# Patient Record
Sex: Male | Born: 1950 | Race: White | Hispanic: No | Marital: Married | Smoking: Former smoker
Health system: Southern US, Community
[De-identification: ages and names within clinical notes are randomized; demographics above are authoritative.]

## PROBLEM LIST (undated history)

## (undated) DIAGNOSIS — I2510 Atherosclerotic heart disease of native coronary artery without angina pectoris: Secondary | ICD-10-CM

## (undated) DIAGNOSIS — I700 Atherosclerosis of aorta: Secondary | ICD-10-CM

## (undated) DIAGNOSIS — Z9289 Personal history of other medical treatment: Secondary | ICD-10-CM

## (undated) DIAGNOSIS — E785 Hyperlipidemia, unspecified: Secondary | ICD-10-CM

## (undated) DIAGNOSIS — K760 Fatty (change of) liver, not elsewhere classified: Secondary | ICD-10-CM

## (undated) DIAGNOSIS — E119 Type 2 diabetes mellitus without complications: Secondary | ICD-10-CM

## (undated) DIAGNOSIS — J438 Other emphysema: Secondary | ICD-10-CM

## (undated) DIAGNOSIS — Z720 Tobacco use: Secondary | ICD-10-CM

## (undated) DIAGNOSIS — I214 Non-ST elevation (NSTEMI) myocardial infarction: Secondary | ICD-10-CM

## (undated) DIAGNOSIS — I10 Essential (primary) hypertension: Secondary | ICD-10-CM

## (undated) HISTORY — DX: Personal history of other medical treatment: Z92.89

## (undated) HISTORY — DX: Non-ST elevation (NSTEMI) myocardial infarction: I21.4

## (undated) HISTORY — DX: Hyperlipidemia, unspecified: E78.5

## (undated) HISTORY — PX: BRAIN SURGERY: SHX531

## (undated) HISTORY — DX: Tobacco use: Z72.0

## (undated) HISTORY — DX: Atherosclerotic heart disease of native coronary artery without angina pectoris: I25.10

## (undated) HISTORY — PX: CARDIAC CATHETERIZATION: SHX172

---

## 2005-09-23 ENCOUNTER — Emergency Department: Payer: Self-pay | Admitting: Emergency Medicine

## 2014-04-27 ENCOUNTER — Emergency Department: Payer: Self-pay | Admitting: Internal Medicine

## 2018-05-15 ENCOUNTER — Emergency Department: Payer: PPO

## 2018-05-15 ENCOUNTER — Encounter: Payer: Self-pay | Admitting: Emergency Medicine

## 2018-05-15 ENCOUNTER — Inpatient Hospital Stay
Admission: EM | Admit: 2018-05-15 | Discharge: 2018-05-17 | DRG: 280 | Disposition: A | Discharge status: Home / Self Care | Payer: PPO | Attending: Internal Medicine | Admitting: Internal Medicine

## 2018-05-15 DIAGNOSIS — I214 Non-ST elevation (NSTEMI) myocardial infarction: Secondary | ICD-10-CM

## 2018-05-15 DIAGNOSIS — Z833 Family history of diabetes mellitus: Secondary | ICD-10-CM | POA: Diagnosis not present

## 2018-05-15 DIAGNOSIS — F17210 Nicotine dependence, cigarettes, uncomplicated: Secondary | ICD-10-CM | POA: Diagnosis not present

## 2018-05-15 DIAGNOSIS — M542 Cervicalgia: Secondary | ICD-10-CM | POA: Diagnosis not present

## 2018-05-15 DIAGNOSIS — Z8249 Family history of ischemic heart disease and other diseases of the circulatory system: Secondary | ICD-10-CM

## 2018-05-15 DIAGNOSIS — I10 Essential (primary) hypertension: Secondary | ICD-10-CM | POA: Diagnosis present

## 2018-05-15 DIAGNOSIS — F17200 Nicotine dependence, unspecified, uncomplicated: Secondary | ICD-10-CM | POA: Diagnosis not present

## 2018-05-15 DIAGNOSIS — J189 Pneumonia, unspecified organism: Secondary | ICD-10-CM | POA: Diagnosis not present

## 2018-05-15 DIAGNOSIS — J181 Lobar pneumonia, unspecified organism: Secondary | ICD-10-CM | POA: Diagnosis not present

## 2018-05-15 DIAGNOSIS — J439 Emphysema, unspecified: Secondary | ICD-10-CM | POA: Insufficient documentation

## 2018-05-15 DIAGNOSIS — I25110 Atherosclerotic heart disease of native coronary artery with unstable angina pectoris: Secondary | ICD-10-CM | POA: Diagnosis present

## 2018-05-15 DIAGNOSIS — M79601 Pain in right arm: Secondary | ICD-10-CM | POA: Diagnosis not present

## 2018-05-15 DIAGNOSIS — Z720 Tobacco use: Secondary | ICD-10-CM | POA: Diagnosis not present

## 2018-05-15 DIAGNOSIS — I200 Unstable angina: Secondary | ICD-10-CM | POA: Diagnosis not present

## 2018-05-15 DIAGNOSIS — E871 Hypo-osmolality and hyponatremia: Secondary | ICD-10-CM | POA: Diagnosis not present

## 2018-05-15 DIAGNOSIS — E785 Hyperlipidemia, unspecified: Secondary | ICD-10-CM | POA: Diagnosis present

## 2018-05-15 DIAGNOSIS — R911 Solitary pulmonary nodule: Secondary | ICD-10-CM | POA: Diagnosis not present

## 2018-05-15 DIAGNOSIS — I700 Atherosclerosis of aorta: Secondary | ICD-10-CM | POA: Diagnosis not present

## 2018-05-15 DIAGNOSIS — I340 Nonrheumatic mitral (valve) insufficiency: Secondary | ICD-10-CM | POA: Diagnosis not present

## 2018-05-15 DIAGNOSIS — E782 Mixed hyperlipidemia: Secondary | ICD-10-CM | POA: Diagnosis not present

## 2018-05-15 DIAGNOSIS — R0789 Other chest pain: Secondary | ICD-10-CM | POA: Diagnosis not present

## 2018-05-15 HISTORY — DX: Non-ST elevation (NSTEMI) myocardial infarction: I21.4

## 2018-05-15 LAB — CBC
HCT: 41.5 % (ref 39.0–52.0)
Hemoglobin: 14.2 g/dL (ref 13.0–17.0)
MCH: 31.4 pg (ref 26.0–34.0)
MCHC: 34.2 g/dL (ref 30.0–36.0)
MCV: 91.8 fL (ref 80.0–100.0)
Platelets: 201 10*3/uL (ref 150–400)
RBC: 4.52 MIL/uL (ref 4.22–5.81)
RDW: 12.3 % (ref 11.5–15.5)
WBC: 13.2 10*3/uL — ABNORMAL HIGH (ref 4.0–10.5)
nRBC: 0 % (ref 0.0–0.2)

## 2018-05-15 LAB — BASIC METABOLIC PANEL
Anion gap: 7 (ref 5–15)
BUN: 12 mg/dL (ref 8–23)
CO2: 25 mmol/L (ref 22–32)
Calcium: 9 mg/dL (ref 8.9–10.3)
Chloride: 101 mmol/L (ref 98–111)
Creatinine, Ser: 1.09 mg/dL (ref 0.61–1.24)
GFR calc Af Amer: >60 mL/min (ref >60)
GFR calc non Af Amer: >60 mL/min (ref >60)
Glucose, Bld: 109 mg/dL — ABNORMAL HIGH (ref 70–99)
POTASSIUM: 4 mmol/L (ref 3.5–5.1)
Sodium: 133 mmol/L — ABNORMAL LOW (ref 135–145)

## 2018-05-15 LAB — TROPONIN I
Troponin I: 6.99 ng/mL (ref <0.03)
Troponin I: 6.92 ng/mL (ref <0.03)

## 2018-05-15 LAB — INFLUENZA PANEL BY PCR (TYPE A & B)
Influenza A By PCR: NEGATIVE
Influenza B By PCR: NEGATIVE

## 2018-05-15 LAB — PROTIME-INR
INR: 0.94
Prothrombin Time: 12.5 seconds (ref 11.4–15.2)

## 2018-05-15 LAB — HEPARIN LEVEL (UNFRACTIONATED): Heparin Unfractionated: <0.1 IU/mL — ABNORMAL LOW (ref 0.30–0.70)

## 2018-05-15 LAB — APTT: aPTT: 31 seconds (ref 24–36)

## 2018-05-15 LAB — HEMOGLOBIN A1C
Hgb A1c MFr Bld: 5.8 % — ABNORMAL HIGH (ref 4.8–5.6)
Mean Plasma Glucose: 119.76 mg/dL

## 2018-05-15 MED ORDER — ASPIRIN 81 MG PO CHEW
324.0000 mg | CHEWABLE_TABLET | Freq: Once | ORAL | Status: AC
  Administered 2018-05-15: 324 mg via ORAL
  Filled 2018-05-15: qty 4

## 2018-05-15 MED ORDER — SODIUM CHLORIDE 0.9 % IV SOLN
500.0000 mg | INTRAVENOUS | Status: DC
  Filled 2018-05-15: qty 500

## 2018-05-15 MED ORDER — SODIUM CHLORIDE 0.9 % IV SOLN: 250.0000 mL | INTRAVENOUS | Status: DC | PRN

## 2018-05-15 MED ORDER — SODIUM CHLORIDE 0.9% FLUSH: 3.0000 mL | INTRAVENOUS | Status: DC | PRN

## 2018-05-15 MED ORDER — METOPROLOL TARTRATE 25 MG PO TABS
25.0000 mg | ORAL_TABLET | Freq: Two times a day (BID) | ORAL | Status: DC
  Administered 2018-05-15 – 2018-05-17 (×4): 25 mg via ORAL
  Filled 2018-05-15 (×4): qty 1

## 2018-05-15 MED ORDER — ZOLPIDEM TARTRATE 5 MG PO TABS: 5.0000 mg | ORAL_TABLET | Freq: Every evening | ORAL | Status: DC | PRN

## 2018-05-15 MED ORDER — SODIUM CHLORIDE 0.9 % IV SOLN
INTRAVENOUS | Status: AC
  Administered 2018-05-15: 21:00:00 via INTRAVENOUS

## 2018-05-15 MED ORDER — ATORVASTATIN CALCIUM 20 MG PO TABS
40.0000 mg | ORAL_TABLET | Freq: Every day | ORAL | Status: DC
  Administered 2018-05-15 – 2018-05-16 (×2): 40 mg via ORAL
  Filled 2018-05-15 (×2): qty 2

## 2018-05-15 MED ORDER — NICOTINE 14 MG/24HR TD PT24
14.0000 mg | MEDICATED_PATCH | Freq: Every day | TRANSDERMAL | Status: DC
  Administered 2018-05-15 – 2018-05-17 (×3): 14 mg via TRANSDERMAL
  Filled 2018-05-15 (×3): qty 1

## 2018-05-15 MED ORDER — HYDRALAZINE HCL 20 MG/ML IJ SOLN: 10.0000 mg | Freq: Four times a day (QID) | INTRAMUSCULAR | Status: DC | PRN

## 2018-05-15 MED ORDER — GUAIFENESIN-DM 100-10 MG/5ML PO SYRP: 5.0000 mL | ORAL_SOLUTION | ORAL | Status: DC | PRN

## 2018-05-15 MED ORDER — SODIUM CHLORIDE 0.9 % IV SOLN
1.0000 g | Freq: Once | INTRAVENOUS | Status: AC
  Administered 2018-05-15: 1 g via INTRAVENOUS
  Filled 2018-05-15: qty 10

## 2018-05-15 MED ORDER — HEPARIN (PORCINE) 25000 UT/250ML-% IV SOLN
1050.0000 [IU]/h | INTRAVENOUS | Status: DC
  Administered 2018-05-15: 750 [IU]/h via INTRAVENOUS
  Filled 2018-05-15: qty 250

## 2018-05-15 MED ORDER — ACETAMINOPHEN 325 MG PO TABS: 650.0000 mg | ORAL_TABLET | ORAL | Status: DC | PRN

## 2018-05-15 MED ORDER — ASPIRIN EC 81 MG PO TBEC
81.0000 mg | DELAYED_RELEASE_TABLET | Freq: Every day | ORAL | Status: DC
  Administered 2018-05-16 – 2018-05-17 (×2): 81 mg via ORAL
  Filled 2018-05-15 (×2): qty 1

## 2018-05-15 MED ORDER — SODIUM CHLORIDE 0.9 % IV SOLN
500.0000 mg | Freq: Once | INTRAVENOUS | Status: AC
  Administered 2018-05-15: 500 mg via INTRAVENOUS
  Filled 2018-05-15: qty 500

## 2018-05-15 MED ORDER — IOHEXOL 350 MG/ML SOLN
75.0000 mL | Freq: Once | INTRAVENOUS | Status: AC | PRN
  Administered 2018-05-15: 75 mL via INTRAVENOUS

## 2018-05-15 MED ORDER — SODIUM CHLORIDE 0.9% FLUSH
3.0000 mL | Freq: Two times a day (BID) | INTRAVENOUS | Status: DC
  Administered 2018-05-15 – 2018-05-17 (×3): 3 mL via INTRAVENOUS

## 2018-05-15 MED ORDER — SODIUM CHLORIDE 0.9 % IV SOLN
1.0000 g | INTRAVENOUS | Status: DC
  Filled 2018-05-15: qty 10

## 2018-05-15 MED ORDER — ALPRAZOLAM 0.25 MG PO TABS: 0.2500 mg | ORAL_TABLET | Freq: Two times a day (BID) | ORAL | Status: DC | PRN

## 2018-05-15 MED ORDER — NITROGLYCERIN 0.4 MG SL SUBL: 0.4000 mg | SUBLINGUAL_TABLET | SUBLINGUAL | Status: DC | PRN

## 2018-05-15 MED ORDER — HEPARIN BOLUS VIA INFUSION
3900.0000 [IU] | Freq: Once | INTRAVENOUS | Status: AC
  Administered 2018-05-15: 3900 [IU] via INTRAVENOUS
  Filled 2018-05-15: qty 3900

## 2018-05-15 MED ORDER — HEPARIN BOLUS VIA INFUSION
2000.0000 [IU] | Freq: Once | INTRAVENOUS | Status: AC
  Administered 2018-05-15: 2000 [IU] via INTRAVENOUS
  Filled 2018-05-15: qty 2000

## 2018-05-15 MED ORDER — ONDANSETRON HCL 4 MG/2ML IJ SOLN: 4.0000 mg | Freq: Four times a day (QID) | INTRAMUSCULAR | Status: DC | PRN

## 2018-05-15 NOTE — H&P (Addendum)
[facility] [place] NAME: Donald Lowery    MR#:  [id number]  DATE OF BIRTH:  1950/07/08  DATE OF ADMISSION:  05/15/2018  PRIMARY CARE PHYSICIAN: Patient, No Pcp Per   REQUESTING/REFERRING PHYSICIAN: Dr. Kerman Passey  CHIEF COMPLAINT:   Chief Complaint  Patient presents with  . Chest Pain  . Nausea   Chest pain on and off for several days. HISTORY OF PRESENT ILLNESS:  Donald Lowery  is a 68 y.o. male with no past medical history.  The patient complains of chest pain on and off for the past several days.  The chest pain is friend across the chest with radiation to the neck, back and bilateral arms.  He also complains of nausea but no diaphoresis or shortness of breath.  He denies any medical history and has no PCP.  He is found elevated troponin level at 6.99.  CT angios did not show any PE but pneumonia.  He is treated with aspirin, heparin drip and antibiotics in the ED.  PAST MEDICAL HISTORY:  History reviewed. No pertinent past medical history.  PAST SURGICAL HISTORY:   Past Surgical History:  Procedure Laterality Date  . BRAIN SURGERY      Brain surgery.  SOCIAL HISTORY:   Social History   Tobacco Use  . Smoking status: Not on file  Substance Use Topics  . Alcohol use: Not on file    FAMILY HISTORY:   Family History  Problem Relation Age of Onset  . Heart Problems Mother   . Heart attack Father   . Diabetes Brother   . Heart Problems Brother     Father has heart attack in 70s, mother and brother had heart problems.  Brother had diabetes.  DRUG ALLERGIES:  No Known Allergies  REVIEW OF SYSTEMS:   Review of Systems  Constitutional: Negative for chills, fever and malaise/fatigue.  HENT: Positive for hearing loss. Negative for sore throat.   Eyes: Negative for blurred vision and double vision.  Respiratory: Negative for cough, hemoptysis, shortness of breath, wheezing and stridor.   Cardiovascular: Positive for  chest pain. Negative for palpitations, orthopnea and leg swelling.  Gastrointestinal: Negative for abdominal pain, blood in stool, diarrhea, melena, nausea and vomiting.  Genitourinary: Negative for dysuria, flank pain and hematuria.  Musculoskeletal: Positive for back pain. Negative for joint pain.  Skin: Negative for rash.  Neurological: Negative for dizziness, sensory change, focal weakness, seizures, loss of consciousness, weakness and headaches.  Endo/Heme/Allergies: Negative for polydipsia.  Psychiatric/Behavioral: Negative for depression. The patient is not nervous/anxious.     MEDICATIONS AT HOME:   Prior to Admission medications   Medication Sig Start Date End Date Taking? Authorizing Provider  ibuprofen (ADVIL,MOTRIN) 200 MG tablet Take 200-400 mg by mouth every 6 (six) hours as needed for mild pain or moderate pain.   Yes [provider]      VITAL SIGNS:  Blood pressure (!) 165/83, pulse 68, temperature 98.1 F (36.7 C), temperature source Oral, resp. rate (!) 22, height 5\' 6"  (1.676 m), weight 65.8 kg, SpO2 99 %.  PHYSICAL EXAMINATION:  Physical Exam  GENERAL:  68 y.o.-year-old patient lying in the bed with no acute distress.  EYES: Pupils equal, round, reactive to light and accommodation. No scleral icterus. Extraocular muscles intact.  HEENT: Head atraumatic, normocephalic. Oropharynx and nasopharynx clear.  NECK:  Supple, no jugular venous distention. No thyroid enlargement, no tenderness.  LUNGS: Normal breath sounds bilaterally, no wheezing, bilateral  crackles but no rhonchi or crepitation. No use of accessory muscles of respiration.  CARDIOVASCULAR: S1, S2 normal. No murmurs, rubs, or gallops.  ABDOMEN: Soft, nontender, nondistended. Bowel sounds present. No organomegaly or mass.  EXTREMITIES: No pedal edema, cyanosis, or clubbing.  NEUROLOGIC: Cranial nerves II through XII are intact. Muscle strength 5/5 in all extremities. Sensation intact. Gait not  checked.  PSYCHIATRIC: The patient is alert and oriented x 3.  SKIN: No obvious rash, lesion, or ulcer.   LABORATORY PANEL:   CBC Recent Labs  Lab 05/15/18 1450  WBC 13.2*  HGB 14.2  HCT 41.5  PLT 201   ------------------------------------------------------------------------------------------------------------------  Chemistries  Recent Labs  Lab 05/15/18 1450  NA 133*  K 4.0  CL 101  CO2 25  GLUCOSE 109*  BUN 12  CREATININE 1.09  CALCIUM 9.0   ------------------------------------------------------------------------------------------------------------------  Cardiac Enzymes Recent Labs  Lab 05/15/18 1450  TROPONINI 6.99*   ------------------------------------------------------------------------------------------------------------------  RADIOLOGY:  Dg Chest 2 View  Result Date: 05/15/2018 CLINICAL DATA:  Chest pain for 5 days EXAM: CHEST - 2 VIEW COMPARISON:  None. FINDINGS: The lungs are hyperinflated likely secondary to COPD. There is spiculated left apical airspace disease. There is no pleural effusion or pneumothorax. The heart size is normal. IMPRESSION: Spiculated left apical airspace disease. Differential considerations include an infectious etiology including atypical infection such as tuberculosis versus malignancy. Followup PA and lateral chest X-ray is recommended in 3-4 weeks following trial of antibiotic therapy to ensure resolution and exclude underlying malignancy. Electronically Signed   By: Kathreen Devoid   On: 05/15/2018 15:09   Ct Angio Chest Pe W And/or Wo Contrast  Result Date: 05/15/2018 CLINICAL DATA:  Three episodes of throbbing left neck pain with associated pain in both arms and in the chest over the past 4 days. EXAM: CT ANGIOGRAPHY CHEST WITH CONTRAST TECHNIQUE: Multidetector CT imaging of the chest was performed using the standard protocol during bolus administration of intravenous contrast. Multiplanar CT image reconstructions and MIPs were  obtained to evaluate the vascular anatomy. CONTRAST:  61mL OMNIPAQUE IOHEXOL 350 MG/ML SOLN COMPARISON:  Chest radiographs dated 05/15/2018. FINDINGS: Cardiovascular: Atheromatous calcifications, including the coronary arteries and aorta. Normally opacified pulmonary arteries with no pulmonary arterial filling defects seen. Mediastinum/Nodes: Single mildly enlarged left hilar node with a short axis diameter 10 mm on image number 40 series 4. No enlarged mediastinal or axillary nodes. Unremarkable esophagus and thyroid gland. Lungs/Pleura: Patchy airspace opacity in the left upper lobe with air bronchograms. Bilateral bullous changes, most pronounced in the upper lobes. Minimal bilateral dependent atelectasis. Minimal left pleural fluid. Upper Abdomen: Unremarkable. Musculoskeletal: Thoracic and lower cervical spine degenerative changes. Review of the MIP images confirms the above findings. IMPRESSION: 1. Left upper lobe pneumonia. This has a less atypical appearance on the CT compared to the radiographs. Some of the appearance of the radiographs was due to bullous changes in that area. Followup PA and lateral chest X-ray is recommended in 3-4 weeks following trial of antibiotic therapy to ensure resolution and exclude underlying malignancy. 2. No pulmonary emboli. 3. Minimal probable reactive left hilar adenopathy 4. Minimal left pleural fluid. 5. Changes of COPD with paraseptal emphysema. 6. Minimal calcific coronary artery and aortic atherosclerosis. Aortic Atherosclerosis (ICD10-I70.0) and Emphysema (ICD10-J43.9). Electronically Signed   By: Claudie Revering M.D.   On: 05/15/2018 17:00      IMPRESSION AND PLAN:   Non-STEMI. The patient will be admitted to telemetry floor. Continue heparin drip, aspirin, start Lopressor and Lipitor,  follow-up with troponin level, lipid panel and cardiology consult.  Pneumonia with leukocytosis.  Continue Zithromax and Rocephin, follow-up CBC and cultures.  Hyponatremia.   Normal saline IV and follow-up BMP.  Hypertension.  Start Lopressor and IV hydralazine PRN. Tobacco abuse.  Smoking cessation was counseled for 3-4 minutes.  All the records are reviewed and case discussed with ED provider. Management plans discussed with the patient, his wife and they are in agreement.  CODE STATUS: Full code.  TOTAL TIME TAKING CARE OF THIS PATIENT: 32 minutes.    Demetrios Loll M.D on 05/15/2018 at 5:43 PM  Between 7am to 6pm - Pager - [phone]  After 6pm go to [email or web address] - Proofreader  [facility]  Office  [phone]  CC: Primary care physician; Patient, No Pcp Per   Note: This dictation was prepared with Dragon dictation along with smaller phrase technology. Any transcriptional errors that result from this process are unin

## 2018-05-15 NOTE — Consult Note (Signed)
ANTICOAGULATION CONSULT NOTE - Initial Consult  Pharmacy Consult for Heparin infusion Indication: chest pain/ACS  No Known Allergies  Patient Measurements: Height: 5\' 6"  (167.6 cm) Weight: 147 lb 8 oz (66.9 kg) IBW/kg (Calculated) : 63.8 Heparin Dosing Weight: 65.8  Vital Signs: Temp: 98.1 F (36.7 C) (01/16 1446) Temp Source: Oral (01/16 1446) BP: 165/79 (01/16 1910) Pulse Rate: 65 (01/16 1910)  Labs: Recent Labs    05/15/18 1450 05/15/18 1557 05/15/18 1950 05/15/18 2221  HGB 14.2  --   --   --   HCT 41.5  --   --   --   PLT 201  --   --   --   APTT  --  31  --   --   LABPROT  --  12.5  --   --   INR  --  0.94  --   --   HEPARINUNFRC  --   --   --  <0.10*  CREATININE 1.09  --   --   --   TROPONINI 6.99*  --  6.92*  --     Estimated Creatinine Clearance: 58.5 mL/min (by C-G formula based on SCr of 1.09 mg/dL).   Medical History: History reviewed. No pertinent past medical history.  Medications:  Per med rec patient only takes ibuprofen on occasion.  Assessment: 68 y.o. male with no past medical history, takes no medications, presents to the emergency department for 2 days of intermittent chest pain.  Goal of Therapy:  Heparin level 0.3-0.7 units/ml Monitor platelets by anticoagulation protocol: Yes   Plan:  01/16 @ 2221 HL < 0.10 subtherapeutic. Per RN drip was not stopped. Will rebolus w/ heparin 2000 units IV x 1 and increase rate to 900 units/hr and will recheck HL w/ am labs. Will f/u w/ CBC w/ am labs.  Tobie Lords, PharmD Clinical Pharmacist 05/15/2018,11:26 PM

## 2018-05-15 NOTE — ED Provider Notes (Signed)
[facility] Emergency Department Provider Note  Time seen: 3:59 PM  I have reviewed the triage vital signs and the nursing notes.   HISTORY  Chief Complaint Chest Pain and Nausea    HPI Donald Lowery is a 68 y.o. male with no past medical history, takes no medications, presents to the emergency department for 2 days of intermittent chest pain.  According to the patient for the past 2 days he has had 3 episodes of pain which starts in his neck and then radiates down both of his arms and into the center of his chest and somewhat to his back.  States the pain will last anywhere between 30 minutes and 1 hour before dissipating on its own.  Last episode of pain was late last night.  Currently denies any symptoms at this time.  No chest pain.  States when the symptoms do occur he does not become diaphoretic or short of breath but does become nauseated next he had an episode of vomiting last night.  No leg pain or swelling.  No cardiac history per patient.  Patient is a daily smoker.   History reviewed. No pertinent past medical history.  There are no active problems to display for this patient.   History reviewed. No pertinent surgical history.  Prior to Admission medications   Not on File    Not on File  No family history on file.  Social History Social History   Tobacco Use  . Smoking status: Not on file  Substance Use Topics  . Alcohol use: Not on file  . Drug use: Not on file    Review of Systems Constitutional: Negative for fever. Cardiovascular: 3 episodes of chest pain over the past 2 days.  None currently. Respiratory: Negative for shortness of breath.  No recent cough or congestion. Gastrointestinal: Negative for abdominal pain.  One episode of nausea and vomiting last night with the chest pain. Genitourinary: Negative for urinary compaints Musculoskeletal: Negative for leg pain or swelling Skin: Negative for skin complaints  Neurological:  Negative for headache All other ROS negative  ____________________________________________   PHYSICAL EXAM:  VITAL SIGNS: ED Triage Vitals [05/15/18 1446]  Enc Vitals Group     BP (!) 145/74     Pulse Rate 79     Resp 20     Temp 98.1 F (36.7 C)     Temp Source Oral     SpO2 97 %     Weight 145 lb (65.8 kg)     Height 5\' 6"  (1.676 m)     Head Circumference      Peak Flow      Pain Score 0     Pain Loc      Pain Edu?      Excl. in [place]?    Constitutional: Alert and oriented. Well appearing and in no distress. Eyes: Normal exam ENT   Head: Normocephalic and atraumatic.   Mouth/Throat: Mucous membranes are moist. Cardiovascular: Normal rate, regular rhythm. No murmur Respiratory: Normal respiratory effort without tachypnea nor retractions. Breath sounds are clear  Gastrointestinal: Soft and nontender. No distention. Musculoskeletal: Nontender with normal range of motion in all extremities. No lower extremity tenderness or edema. Neurologic:  Normal speech and language. No gross focal neurologic deficits Skin:  Skin is warm, dry and intact.  Psychiatric: Mood and affect are normal.  ____________________________________________    EKG  EKG viewed and interpreted by myself shows a normal sinus rhythm 80 bpm with  a narrow QRS, normal axis, normal intervals, nonspecific ST changes but no ST elevation.  ____________________________________________    RADIOLOGY  IMPRESSION: Spiculated left apical airspace disease. Differential considerations include an infectious etiology including atypical infection such as tuberculosis versus malignancy. Followup PA and lateral chest X-ray is recommended in 3-4 weeks following trial of antibiotic therapy to ensure resolution and exclude underlying malignancy.  ____________________________________________   INITIAL IMPRESSION / ASSESSMENT AND PLAN / ED COURSE  Pertinent labs & imaging results that were available during my  care of the patient were reviewed by me and considered in my medical decision making (see chart for details).  Patient presents to the emergency department for 3 episodes of chest pain occurring over the past 2 days.  Differential would include ACS, PE, chest wall pain. Patient's labs have resulted showing significant troponin elevation 6.99.  Chest x-ray shows spiculated airspace disease, patient is a daily smoker.  Given elevated troponin we will obtain CT scan of the chest with contrast to help rule out PE but also to evaluate the lung mass.  Patient agreeable to plan of care.  I have ordered a heparin infusion, we will dose aspirin.  Regardless the patient will ultimately need to be admitted to the hospitalist service once his emergency department work-up is completed.  Reassuringly no ST elevation on EKG and no current chest symptoms.  CT angiography shows no PE, states the spiculated lesion seen on x-ray is most consistent with pneumonia on CT.  We will check blood cultures and start IV antibiotics to cover for pneumonia.  Patient receiving IV heparin, will be admitted to the hospitalist service.    CRITICAL CARE Performed by: Harvest Dark   Total critical care time: 30 minutes  Critical care time was exclusive of separately billable procedures and treating other patients.  Critical care was necessary to treat or prevent imminent or life-threatening deterioration.  Critical care was time spent personally by me on the following activities: development of treatment plan with patient and/or surrogate as well as nursing, discussions with consultants, evaluation of patient's response to treatment, examination of patient, obtaining history from patient or surrogate, ordering and performing treatments and interventions, ordering and review of laboratory studies, ordering and review of radiographic studies, pulse oximetry and re-evaluation of patient's  condition.  ____________________________________________   FINAL CLINICAL IMPRESSION(S) / ED DIAGNOSES  NSTEMI Pneumonia   Harvest Dark, MD 05/15/18 1709

## 2018-05-15 NOTE — ED Notes (Signed)
ED TO INPATIENT HANDOFF REPORT  Name/Age/Gender Donald Lowery 68 y.o. male  Code Status   Home/SNF/Other From Home   Chief Complaint neck and arma pin  Level of Care/Admitting Diagnosis ED Disposition    ED Disposition Condition [place]: [facility] [[id number]]  Level of Care: Telemetry [5]  Diagnosis: NSTEMI (non-ST elevated myocardial infarction) [facility]) [[id number]]  Admitting Physician: Demetrios Loll [[id number]]  Attending Physician: Demetrios Loll [[id number]]  Estimated length of stay: past midnight tomorrow  Certification:: I certify this patient will need inpatient services for at least 2 midnights  PT Class (Do Not Modify): Inpatient [101]  PT Acc Code (Do Not Modify): Private [1]       Medical History History reviewed. No pertinent past medical history.  Allergies No Known Allergies  IV Location/Drains/Wounds Patient Lines/Drains/Airways Status   Active Line/Drains/Airways    Name:   Placement date:   Placement time:   Site:   Days:   Peripheral IV 05/15/18 Right Antecubital   05/15/18    1605    Antecubital   less than 1   Peripheral IV 05/15/18 Left Antecubital   05/15/18    1635    Antecubital   less than 1          Labs/Imaging Results for orders placed or performed during the hospital encounter of 05/15/18 (from the past 48 hour(s))  Basic metabolic panel     Status: Abnormal   Collection Time: 05/15/18  2:50 PM  Result Value Ref Range   Sodium 133 (L) 135 - 145 mmol/L   Potassium 4.0 3.5 - 5.1 mmol/L   Chloride 101 98 - 111 mmol/L   CO2 25 22 - 32 mmol/L   Glucose, Bld 109 (H) 70 - 99 mg/dL   BUN 12 8 - 23 mg/dL   Creatinine, Ser 1.09 0.61 - 1.24 mg/dL   Calcium 9.0 8.9 - 10.3 mg/dL   GFR calc non Af Amer >60 >60 mL/min   GFR calc Af Amer >60 >60 mL/min   Anion gap 7 5 - 15    Comment: Performed at [facility], [address] [place], [place] [address]  CBC     Status: Abnormal   Collection Time:  05/15/18  2:50 PM  Result Value Ref Range   WBC 13.2 (H) 4.0 - 10.5 K/uL   RBC 4.52 4.22 - 5.81 MIL/uL   Hemoglobin 14.2 13.0 - 17.0 g/dL   HCT 41.5 39.0 - 52.0 %   MCV 91.8 80.0 - 100.0 fL   MCH 31.4 26.0 - 34.0 pg   MCHC 34.2 30.0 - 36.0 g/dL   RDW 12.3 11.5 - 15.5 %   Platelets 201 150 - 400 K/uL   nRBC 0.0 0.0 - 0.2 %    Comment: Performed at [facility], [address] [place], [place] [address]  Troponin I - ONCE - STAT     Status: Abnormal   Collection Time: 05/15/18  2:50 PM  Result Value Ref Range   Troponin I 6.99 (HH) <0.03 ng/mL    Comment: CRITICAL RESULT CALLED TO, READ BACK BY AND VERIFIED WITH Gwynn Burly 05/15/18 @ [place] Performed at [facility], [address] [place], [place] [address]   Protime-INR     Status: None   Collection Time: 05/15/18  3:57 PM  Result Value Ref Range   Prothrombin Time 12.5 11.4 - 15.2 seconds   INR 0.94  Comment: Performed at [facility], [address] [place], [place] [address]  APTT     Status: None   Collection Time: 05/15/18  3:57 PM  Result Value Ref Range   aPTT 31 24 - 36 seconds    Comment: Performed at [facility], [address] [place], [place] [address]   Dg Chest 2 View  Result Date: 05/15/2018 CLINICAL DATA:  Chest pain for 5 days EXAM: CHEST - 2 VIEW COMPARISON:  None. FINDINGS: The lungs are hyperinflated likely secondary to COPD. There is spiculated left apical airspace disease. There is no pleural effusion or pneumothorax. The heart size is normal. IMPRESSION: Spiculated left apical airspace disease. Differential considerations include an infectious etiology including atypical infection such as tuberculosis versus malignancy. Followup PA and lateral chest X-ray is recommended in 3-4 weeks following trial of antibiotic therapy to ensure resolution and exclude underlying malignancy. Electronically Signed   By: Kathreen Devoid   On: 05/15/2018 15:09   Ct Angio Chest Pe W  And/or Wo Contrast  Result Date: 05/15/2018 CLINICAL DATA:  Three episodes of throbbing left neck pain with associated pain in both arms and in the chest over the past 4 days. EXAM: CT ANGIOGRAPHY CHEST WITH CONTRAST TECHNIQUE: Multidetector CT imaging of the chest was performed using the standard protocol during bolus administration of intravenous contrast. Multiplanar CT image reconstructions and MIPs were obtained to evaluate the vascular anatomy. CONTRAST:  86mL OMNIPAQUE IOHEXOL 350 MG/ML SOLN COMPARISON:  Chest radiographs dated 05/15/2018. FINDINGS: Cardiovascular: Atheromatous calcifications, including the coronary arteries and aorta. Normally opacified pulmonary arteries with no pulmonary arterial filling defects seen. Mediastinum/Nodes: Single mildly enlarged left hilar node with a short axis diameter 10 mm on image number 40 series 4. No enlarged mediastinal or axillary nodes. Unremarkable esophagus and thyroid gland. Lungs/Pleura: Patchy airspace opacity in the left upper lobe with air bronchograms. Bilateral bullous changes, most pronounced in the upper lobes. Minimal bilateral dependent atelectasis. Minimal left pleural fluid. Upper Abdomen: Unremarkable. Musculoskeletal: Thoracic and lower cervical spine degenerative changes. Review of the MIP images confirms the above findings. IMPRESSION: 1. Left upper lobe pneumonia. This has a less atypical appearance on the CT compared to the radiographs. Some of the appearance of the radiographs was due to bullous changes in that area. Followup PA and lateral chest X-ray is recommended in 3-4 weeks following trial of antibiotic therapy to ensure resolution and exclude underlying malignancy. 2. No pulmonary emboli. 3. Minimal probable reactive left hilar adenopathy 4. Minimal left pleural fluid. 5. Changes of COPD with paraseptal emphysema. 6. Minimal calcific coronary artery and aortic atherosclerosis. Aortic Atherosclerosis (ICD10-I70.0) and Emphysema  (ICD10-J43.9). Electronically Signed   By: Claudie Revering M.D.   On: 05/15/2018 17:00    Pending Labs Unresulted Labs (From admission, onward)    Start     Ordered   05/15/18 2230  Heparin level (unfractionated)  Once-Timed,   STAT     05/15/18 1609   05/15/18 1709  Blood culture (routine x 2)  BLOOD CULTURE X 2,   STAT     05/15/18 1708   Signed and Held  HIV antibody (Routine Testing)  Once,   R     Signed and Held   Signed and Held  Troponin I - Now Then Q6H  Now then every 6 hours,   STAT     Signed and Held   Signed and Held  Hemoglobin A1c  Add-on,   R     Signed  and Held   Signed and Held  Culture, blood (routine x 2) Call MD if unable to obtain prior to antibiotics being given  BLOOD CULTURE X 2,   R    Comments:  If blood cultures drawn in Emergency Department - Do not draw and cancel order    Signed and Held   Signed and Held  Culture, sputum-assessment  Once,   R     Signed and Held   Signed and Held  Gram stain  Once,   R     Signed and Held   Signed and Held  Strep pneumoniae urinary antigen  Once,   R     Signed and Held   Signed and Held  Influenza panel by PCR (type A & B)  (Influenza PCR Panel)  Once,   R     Signed and Held   Signed and Held  Lipid panel  Tomorrow morning,   R     Signed and Held          Vitals/Pain Today's Vitals   05/15/18 1630 05/15/18 1700 05/15/18 1715 05/15/18 1730  BP: (!) 152/71 (!) 144/108  (!) 165/83  Pulse:  82 62 68  Resp: 16 13 16  (!) 22  Temp:      TempSrc:      SpO2:  97% 95% 99%  Weight:      Height:      PainSc:        Isolation Precautions No active isolations  Medications Medications  heparin ADULT infusion 100 units/mL (25000 units/276mL sodium chloride 0.45%) (750 Units/hr Intravenous New Bag/Given 05/15/18 1627)  cefTRIAXone (ROCEPHIN) 1 g in sodium chloride 0.9 % 100 mL IVPB (1 g Intravenous New Bag/Given 05/15/18 1817)  azithromycin (ZITHROMAX) 500 mg in sodium chloride 0.9 % 250 mL IVPB (has no  administration in time range)  aspirin chewable tablet 324 mg (324 mg Oral Given 05/15/18 1559)  heparin bolus via infusion 3,900 Units (3,900 Units Intravenous Bolus from Bag 05/15/18 1629)  iohexol (OMNIPAQUE) 350 MG/ML injection 75 mL (75 mLs Intravenous Contrast Given 05/15/18 1639)    Mobility Ambulatory

## 2018-05-15 NOTE — ED Notes (Signed)
[address] place to [facility], labs drawn and sent. Po meds given. Patient awaiting CT Angio. Family at bedside. Call light with in reach. Safety maintained. Will continue to monitor. VSS

## 2018-05-15 NOTE — Consult Note (Signed)
ANTICOAGULATION CONSULT NOTE - Initial Consult  Pharmacy Consult for Heparin infusion Indication: chest pain/ACS  Not on File  Patient Measurements: Height: 5\' 6"  (167.6 cm) Weight: 145 lb (65.8 kg) IBW/kg (Calculated) : 63.8 Heparin Dosing Weight: 65.8  Vital Signs: Temp: 98.1 F (36.7 C) (01/16 1446) Temp Source: Oral (01/16 1446) BP: 145/74 (01/16 1446) Pulse Rate: 79 (01/16 1446)  Labs: Recent Labs    05/15/18 1450  HGB 14.2  HCT 41.5  PLT 201  CREATININE 1.09  TROPONINI 6.99*    Estimated Creatinine Clearance: 58.5 mL/min (by C-G formula based on SCr of 1.09 mg/dL).   Medical History: History reviewed. No pertinent past medical history.  Medications:  Per med rec patient only takes ibuprofen on occasion.  Assessment: 68 y.o. male with no past medical history, takes no medications, presents to the emergency department for 2 days of intermittent chest pain.  Goal of Therapy:  Heparin level 0.3-0.7 units/ml Monitor platelets by anticoagulation protocol: Yes   Plan:  Give 3900 units bolus x 1 Start heparin infusion at 750 units/hr Check heparin level in 6 hours until two consecutive therapeutic levels then daily and CBC daily while on heparin Continue to monitor H&H and platelets  Forrest Moron, PharmD Clinical Pharmacist 05/15/2018,3:59 PM

## 2018-05-15 NOTE — ED Notes (Signed)
Unable to give nurse Rona Ravens RN report. 2 attempts to call and phone has been busy both times, called desk as to be transferred to receiving nurse, secretary unable to get through to her phone either.

## 2018-05-15 NOTE — ED Notes (Signed)
Report called and given to receiving nurse. Azithromycin sent up with patient to be started when rocephin is complete. Wife to follow. Patient up to unit via stretcher.

## 2018-05-15 NOTE — ED Triage Notes (Signed)
Pt reports has been having intermittent episodes of getting a cramp in his neck and radiate down his arms and into his chest, arms and fingers. Pt states last episode was last pm.

## 2018-05-15 NOTE — ED Notes (Signed)
Patient reports having 3 episodes that began on Sunday. These episodes are described as throbbing pain to left side of nect causing pain to b/l arms with n/v. Pain lasting about an hour then subsides on its own.

## 2018-05-15 NOTE — ED Notes (Signed)
Second line place to left AC 18G, patient off unit to CT angio.

## 2018-05-15 NOTE — Progress Notes (Signed)
Advanced Care Plan.  Purpose of Encounter: CODE STATUS. Parties in Attendance: The patient, his wife and me. Patient's Decisional Capacity: Yes. Medical Story: Donald Lowery  is a 68 y.o. male with no past medical history.  He presented the ED with chest pain.  He is being admitted to hospital due to non-STEMI and pneumonia.  I discussed with the patient about his current condition, prognosis and CODE STATUS.  Initially he did not want to be resuscitated or intubated if he has cardiopulmonary arrest.  Then after discussion with his wife, he decided that he wants to be resuscitated and intubated if he has cardiopulmonary arrest. Plan:  Code Status: Full code. Time spent discussing advance care planning: 18 minutes.

## 2018-05-15 NOTE — ED Notes (Signed)
Heparin bolus given from pump. Heparin infusing to begin when bolus complete.

## 2018-05-16 ENCOUNTER — Other Ambulatory Visit: Payer: Self-pay

## 2018-05-16 ENCOUNTER — Encounter
Admission: EM | Disposition: A | Discharge status: Home / Self Care | Payer: Self-pay | Source: Home / Self Care | Attending: Internal Medicine

## 2018-05-16 ENCOUNTER — Encounter: Payer: Self-pay | Admitting: Cardiovascular Disease

## 2018-05-16 DIAGNOSIS — I214 Non-ST elevation (NSTEMI) myocardial infarction: Principal | ICD-10-CM

## 2018-05-16 DIAGNOSIS — F17200 Nicotine dependence, unspecified, uncomplicated: Secondary | ICD-10-CM

## 2018-05-16 DIAGNOSIS — I200 Unstable angina: Secondary | ICD-10-CM

## 2018-05-16 DIAGNOSIS — E782 Mixed hyperlipidemia: Secondary | ICD-10-CM

## 2018-05-16 DIAGNOSIS — I2510 Atherosclerotic heart disease of native coronary artery without angina pectoris: Secondary | ICD-10-CM | POA: Insufficient documentation

## 2018-05-16 DIAGNOSIS — R911 Solitary pulmonary nodule: Secondary | ICD-10-CM

## 2018-05-16 HISTORY — PX: LEFT HEART CATH AND CORONARY ANGIOGRAPHY: CATH118249

## 2018-05-16 LAB — CBC
HCT: 41.4 % (ref 39.0–52.0)
Hemoglobin: 13.8 g/dL (ref 13.0–17.0)
MCH: 30.9 pg (ref 26.0–34.0)
MCHC: 33.3 g/dL (ref 30.0–36.0)
MCV: 92.8 fL (ref 80.0–100.0)
Platelets: 175 10*3/uL (ref 150–400)
RBC: 4.46 MIL/uL (ref 4.22–5.81)
RDW: 12.5 % (ref 11.5–15.5)
WBC: 9.5 10*3/uL (ref 4.0–10.5)
nRBC: 0 % (ref 0.0–0.2)

## 2018-05-16 LAB — HEPARIN LEVEL (UNFRACTIONATED): Heparin Unfractionated: 0.16 IU/mL — ABNORMAL LOW (ref 0.30–0.70)

## 2018-05-16 LAB — TROPONIN I
Troponin I: 7.4 ng/mL (ref <0.03)
Troponin I: 4.41 ng/mL (ref <0.03)
Troponin I: 5.36 ng/mL (ref <0.03)

## 2018-05-16 LAB — LIPID PANEL
Cholesterol: 223 mg/dL — ABNORMAL HIGH (ref 0–200)
HDL: 27 mg/dL — ABNORMAL LOW (ref >40)
LDL Cholesterol: 166 mg/dL — ABNORMAL HIGH (ref 0–99)
Total CHOL/HDL Ratio: 8.3 RATIO
Triglycerides: 148 mg/dL (ref <150)
VLDL: 30 mg/dL (ref 0–40)

## 2018-05-16 LAB — PROCALCITONIN: Procalcitonin: <0.1 ng/mL

## 2018-05-16 LAB — STREP PNEUMONIAE URINARY ANTIGEN: Strep Pneumo Urinary Antigen: NEGATIVE

## 2018-05-16 SURGERY — LEFT HEART CATH AND CORONARY ANGIOGRAPHY
Anesthesia: Moderate Sedation

## 2018-05-16 MED ORDER — ASPIRIN 81 MG PO TBEC: 81.0000 mg | DELAYED_RELEASE_TABLET | Freq: Every day | ORAL | 0 refills | Status: AC

## 2018-05-16 MED ORDER — IOPAMIDOL (ISOVUE-300) INJECTION 61%
INTRAVENOUS | Status: DC | PRN
  Administered 2018-05-16: 115 mL via INTRA_ARTERIAL

## 2018-05-16 MED ORDER — SODIUM CHLORIDE 0.9 % WEIGHT BASED INFUSION
3.0000 mL/kg/h | INTRAVENOUS | Status: DC
  Administered 2018-05-16: 3 mL/kg/h via INTRAVENOUS

## 2018-05-16 MED ORDER — ONDANSETRON HCL 4 MG/2ML IJ SOLN: 4.0000 mg | Freq: Four times a day (QID) | INTRAMUSCULAR | Status: DC | PRN

## 2018-05-16 MED ORDER — SODIUM CHLORIDE 0.9% FLUSH
3.0000 mL | Freq: Two times a day (BID) | INTRAVENOUS | Status: DC
  Administered 2018-05-16 – 2018-05-17 (×2): 3 mL via INTRAVENOUS

## 2018-05-16 MED ORDER — FENTANYL CITRATE (PF) 100 MCG/2ML IJ SOLN
INTRAMUSCULAR | Status: DC | PRN
  Administered 2018-05-16: 25 ug via INTRAVENOUS

## 2018-05-16 MED ORDER — CLOPIDOGREL BISULFATE 75 MG PO TABS
75.0000 mg | ORAL_TABLET | Freq: Every day | ORAL | Status: DC
  Administered 2018-05-17: 75 mg via ORAL
  Filled 2018-05-16: qty 1

## 2018-05-16 MED ORDER — FENTANYL CITRATE (PF) 100 MCG/2ML IJ SOLN
INTRAMUSCULAR | Status: AC
  Filled 2018-05-16: qty 2

## 2018-05-16 MED ORDER — HEPARIN BOLUS VIA INFUSION
1500.0000 [IU] | Freq: Once | INTRAVENOUS | Status: AC
  Administered 2018-05-16: 1500 [IU] via INTRAVENOUS
  Filled 2018-05-16: qty 1500

## 2018-05-16 MED ORDER — CLOPIDOGREL BISULFATE 75 MG PO TABS
300.0000 mg | ORAL_TABLET | Freq: Every day | ORAL | Status: DC
  Administered 2018-05-16: 300 mg via ORAL
  Filled 2018-05-16 (×2): qty 4

## 2018-05-16 MED ORDER — NITROGLYCERIN 5 MG/ML IV SOLN
INTRAVENOUS | Status: AC
  Filled 2018-05-16: qty 10

## 2018-05-16 MED ORDER — NITROGLYCERIN 1 MG/10 ML FOR IR/CATH LAB
INTRA_ARTERIAL | Status: DC | PRN
  Administered 2018-05-16: 200 ug via INTRACORONARY

## 2018-05-16 MED ORDER — SODIUM CHLORIDE 0.9% FLUSH: 3.0000 mL | INTRAVENOUS | Status: DC | PRN

## 2018-05-16 MED ORDER — HEPARIN (PORCINE) IN NACL 1000-0.9 UT/500ML-% IV SOLN
INTRAVENOUS | Status: AC
  Filled 2018-05-16: qty 1000

## 2018-05-16 MED ORDER — SODIUM CHLORIDE 0.9 % WEIGHT BASED INFUSION: 1.0000 mL/kg/h | INTRAVENOUS | Status: DC

## 2018-05-16 MED ORDER — MIDAZOLAM HCL 2 MG/2ML IJ SOLN
INTRAMUSCULAR | Status: AC
  Filled 2018-05-16: qty 2

## 2018-05-16 MED ORDER — ASPIRIN 81 MG PO CHEW: 81.0000 mg | CHEWABLE_TABLET | ORAL | Status: DC

## 2018-05-16 MED ORDER — ACETAMINOPHEN 325 MG PO TABS: 650.0000 mg | ORAL_TABLET | ORAL | Status: DC | PRN

## 2018-05-16 MED ORDER — CLOPIDOGREL BISULFATE 75 MG PO TABS: 75.0000 mg | ORAL_TABLET | Freq: Every day | ORAL | 0 refills | Status: DC

## 2018-05-16 MED ORDER — ATORVASTATIN CALCIUM 40 MG PO TABS: 40.0000 mg | ORAL_TABLET | Freq: Every day | ORAL | 0 refills | Status: DC

## 2018-05-16 MED ORDER — ISOSORBIDE MONONITRATE ER 30 MG PO TB24
30.0000 mg | ORAL_TABLET | Freq: Every day | ORAL | Status: DC
  Administered 2018-05-16 – 2018-05-17 (×2): 30 mg via ORAL
  Filled 2018-05-16 (×2): qty 1

## 2018-05-16 MED ORDER — HEPARIN (PORCINE) IN NACL 1000-0.9 UT/500ML-% IV SOLN
INTRAVENOUS | Status: DC | PRN
  Administered 2018-05-16 (×2): 500 mL

## 2018-05-16 MED ORDER — METOPROLOL TARTRATE 25 MG PO TABS: 25.0000 mg | ORAL_TABLET | Freq: Two times a day (BID) | ORAL | 0 refills | Status: DC

## 2018-05-16 MED ORDER — SODIUM CHLORIDE 0.9 % WEIGHT BASED INFUSION: 1.0000 mL/kg/h | INTRAVENOUS | Status: AC

## 2018-05-16 MED ORDER — MIDAZOLAM HCL 2 MG/2ML IJ SOLN
INTRAMUSCULAR | Status: DC | PRN
  Administered 2018-05-16: 0.5 mg via INTRAVENOUS

## 2018-05-16 MED ORDER — SODIUM CHLORIDE 0.9 % IV SOLN: 250.0000 mL | INTRAVENOUS | Status: DC | PRN

## 2018-05-16 SURGICAL SUPPLY — 14 items
CATH INFINITI 5FR ANG PIGTAIL (CATHETERS) ×2 IMPLANT
CATH INFINITI 5FR JL4 (CATHETERS) ×2 IMPLANT
CATH INFINITI JR4 5F (CATHETERS) ×2 IMPLANT
DEVICE CLOSURE MYNXGRIP 5F (Vascular Products) ×2 IMPLANT
KIT MANI 3VAL PERCEP (MISCELLANEOUS) ×3 IMPLANT
NDL PERC 18GX7CM (NEEDLE) IMPLANT
NDL SMART REG 18GX2-3/4 (NEEDLE) IMPLANT
NEEDLE PERC 18GX7CM (NEEDLE) ×3 IMPLANT
NEEDLE SMART REG 18GX2-3/4 (NEEDLE) ×3 IMPLANT
PACK CARDIAC CATH (CUSTOM PROCEDURE TRAY) ×3 IMPLANT
SET INTRO CAPELLA COAXIAL (SET/KITS/TRAYS/PACK) ×2 IMPLANT
SHEATH AVANTI 5FR X 11CM (SHEATH) ×2 IMPLANT
WIRE GUIDERIGHT .035X150 (WIRE) ×2 IMPLANT
WIRE HITORQ VERSACORE ST 145CM (WIRE) ×2 IMPLANT

## 2018-05-16 NOTE — Progress Notes (Signed)
Pt refused bed alarm but was educated about safety. Will continue to monitor.

## 2018-05-16 NOTE — Progress Notes (Signed)
[place] [facility] [place] NAME: Gerritt Galentine    MR#:  [id number]  DATE OF BIRTH:  1951-04-05  SUBJECTIVE:  CHIEF COMPLAINT:   Chief Complaint  Patient presents with  . Chest Pain  . Nausea   No CP today. No SOB, cough  Cath earlier today  REVIEW OF SYSTEMS:    Review of Systems  Constitutional: Positive for malaise/fatigue. Negative for chills and fever.  HENT: Negative for sore throat.   Eyes: Negative for blurred vision, double vision and pain.  Respiratory: Negative for cough, hemoptysis, shortness of breath and wheezing.   Cardiovascular: Negative for chest pain, palpitations, orthopnea and leg swelling.  Gastrointestinal: Negative for abdominal pain, constipation, diarrhea, heartburn, nausea and vomiting.  Genitourinary: Negative for dysuria and hematuria.  Musculoskeletal: Negative for back pain and joint pain.  Skin: Negative for rash.  Neurological: Negative for sensory change, speech change, focal weakness and headaches.  Endo/Heme/Allergies: Does not bruise/bleed easily.  Psychiatric/Behavioral: Negative for depression. The patient is not nervous/anxious.     DRUG ALLERGIES:  No Known Allergies  VITALS:  Blood pressure (!) 161/78, pulse 64, temperature 98.1 F (36.7 C), temperature source Oral, resp. rate 18, height 5\' 6"  (1.676 m), weight 69.6 kg, SpO2 97 %.  PHYSICAL EXAMINATION:   Physical Exam  GENERAL:  68 y.o.-year-old patient lying in the bed with no acute distress.  EYES: Pupils equal, round, reactive to light and accommodation. No scleral icterus. Extraocular muscles intact.  HEENT: Head atraumatic, normocephalic. Oropharynx and nasopharynx clear.  NECK:  Supple, no jugular venous distention. No thyroid enlargement, no tenderness.  LUNGS: Normal breath sounds bilaterally, no wheezing, rales, rhonchi. No use of accessory muscles of respiration.  CARDIOVASCULAR: S1, S2 normal. No murmurs, rubs, or gallops.   ABDOMEN: Soft, nontender, nondistended. Bowel sounds present. No organomegaly or mass.  EXTREMITIES: No cyanosis, clubbing or edema b/l.    NEUROLOGIC: Cranial nerves II through XII are intact. No focal Motor or sensory deficits b/l.   PSYCHIATRIC: The patient is alert and oriented x 3.  SKIN: No obvious rash, lesion, or ulcer.   LABORATORY PANEL:   CBC Recent Labs  Lab 05/16/18 0459  WBC 9.5  HGB 13.8  HCT 41.4  PLT 175   ------------------------------------------------------------------------------------------------------------------ Chemistries  Recent Labs  Lab 05/15/18 1450  NA 133*  K 4.0  CL 101  CO2 25  GLUCOSE 109*  BUN 12  CREATININE 1.09  CALCIUM 9.0   ------------------------------------------------------------------------------------------------------------------  Cardiac Enzymes Recent Labs  Lab 05/16/18 1328  TROPONINI 4.41*   ------------------------------------------------------------------------------------------------------------------  RADIOLOGY:  Dg Chest 2 View  Result Date: 05/15/2018 CLINICAL DATA:  Chest pain for 5 days EXAM: CHEST - 2 VIEW COMPARISON:  None. FINDINGS: The lungs are hyperinflated likely secondary to COPD. There is spiculated left apical airspace disease. There is no pleural effusion or pneumothorax. The heart size is normal. IMPRESSION: Spiculated left apical airspace disease. Differential considerations include an infectious etiology including atypical infection such as tuberculosis versus malignancy. Followup PA and lateral chest X-ray is recommended in 3-4 weeks following trial of antibiotic therapy to ensure resolution and exclude underlying malignancy. Electronically Signed   By: Kathreen Devoid   On: 05/15/2018 15:09   Ct Angio Chest Pe W And/or Wo Contrast  Result Date: 05/15/2018 CLINICAL DATA:  Three episodes of throbbing left neck pain with associated pain in both arms and in the chest over the past 4 days. EXAM: CT  ANGIOGRAPHY CHEST WITH CONTRAST TECHNIQUE:  Multidetector CT imaging of the chest was performed using the standard protocol during bolus administration of intravenous contrast. Multiplanar CT image reconstructions and MIPs were obtained to evaluate the vascular anatomy. CONTRAST:  61mL OMNIPAQUE IOHEXOL 350 MG/ML SOLN COMPARISON:  Chest radiographs dated 05/15/2018. FINDINGS: Cardiovascular: Atheromatous calcifications, including the coronary arteries and aorta. Normally opacified pulmonary arteries with no pulmonary arterial filling defects seen. Mediastinum/Nodes: Single mildly enlarged left hilar node with a short axis diameter 10 mm on image number 40 series 4. No enlarged mediastinal or axillary nodes. Unremarkable esophagus and thyroid gland. Lungs/Pleura: Patchy airspace opacity in the left upper lobe with air bronchograms. Bilateral bullous changes, most pronounced in the upper lobes. Minimal bilateral dependent atelectasis. Minimal left pleural fluid. Upper Abdomen: Unremarkable. Musculoskeletal: Thoracic and lower cervical spine degenerative changes. Review of the MIP images confirms the above findings. IMPRESSION: 1. Left upper lobe pneumonia. This has a less atypical appearance on the CT compared to the radiographs. Some of the appearance of the radiographs was due to bullous changes in that area. Followup PA and lateral chest X-ray is recommended in 3-4 weeks following trial of antibiotic therapy to ensure resolution and exclude underlying malignancy. 2. No pulmonary emboli. 3. Minimal probable reactive left hilar adenopathy 4. Minimal left pleural fluid. 5. Changes of COPD with paraseptal emphysema. 6. Minimal calcific coronary artery and aortic atherosclerosis. Aortic Atherosclerosis (ICD10-I70.0) and Emphysema (ICD10-J43.9). Electronically Signed   By: Claudie Revering M.D.   On: 05/15/2018 17:00     ASSESSMENT AND PLAN:   * NSTEMI ASA, Plavix, Statin, Metoprolol Cath with diffuse disease not  amenable to PCI or CABG Continue to monitor per Dr. Rockey Situ  * HTN Continue metoprolol  * RUL scarring vs mass. Not pneumonia No cough or SOB. Afebrile Procalcitonin normal Stop abx Will need repeat CXR/Ct chest in 4 weeks  * Tobacco use Counseled to quit on admission  All the records are reviewed and case discussed with Care Management/Social Workerr. Management plans discussed with the patient, family and they are in agreement.  CODE STATUS: FULL CODE  DVT Prophylaxis: SCDs  TOTAL TIME TAKING CARE OF THIS PATIENT: 35 minutes.   POSSIBLE D/C IN 1-2DAYS, DEPENDING ON CLINICAL CONDITION.  Leia Alf Xia Stohr M.D on 05/16/2018 at 2:54 PM  Between 7am to 6pm - Pager - [phone]  After 6pm go to [email or web address] - password [email or web address] [facility]  Office  [phone]  CC: Primary care physician; Patient, No Pcp Per  Note: This dictation was prepared with Dragon dictation along with smaller phrase technology. Any transcriptional errors that result from this process are unintentional.
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
## 2018-05-16 NOTE — Plan of Care (Signed)
Nutrition Education Note  RD consulted for nutrition education regarding a Heart Healthy diet.  Spoke with pt and wife at bedside. Pt eating lunch at time of visit and reports that it is his first meal since admission to the hospital. Pt states that his appetite is good and denies any recent changes in appetite or weight.  Pt's wife shares that pt eats very "plain" food. Per pt's wife, pt does not eat a lot of fried food, only [place] fries every so often. Pt and wife share that pt typically eats 3 meals daily. A meal may include cream of chicken in the crock-pot over rice or pork chops in the crock-pot over rice. Pt likes steamed carrots, cauliflower, broccoli, and potatoes with his lunch and dinner meals. Pt's wife also shares that pt consumes a lot of raw vegetables like cucumbers, peppers, and salad.  Pt's wife shares that pt "likes his salt" but does not use a lot of salt at the table. Per pt's wife, she does not usually cook with butter because she uses the crock-pot frequently.  Lipid Panel     Component Value Date/Time   CHOL 223 (H) 05/16/2018 0459   TRIG 148 05/16/2018 0459   HDL 27 (L) 05/16/2018 0459   CHOLHDL 8.3 05/16/2018 0459   VLDL 30 05/16/2018 0459   LDLCALC 166 (H) 05/16/2018 0459    RD provided "Heart Healthy Nutrition Therapy" handout from the Academy of [employer]. Reviewed patient's dietary recall. Provided examples on ways to decrease sodium and saturated fat intake in diet. Discouraged intake of processed foods and use of salt shaker.  Encouraged fresh fruits and vegetables as well as whole grain sources of carbohydrates to maximize fiber intake. Discussed the role of fiber in heart healthy. Teach back method used.  Expect good compliance.  Body mass index is 24.77 kg/m. Pt meets criteria for normal weight based on current BMI.  Current diet order is Regular. No meal completion charted at this time. Labs and medications reviewed. No further  nutrition interventions warranted at this time. RD contact information provided. If additional nutrition issues arise, please re-consult RD.   Gaynell Face, MS, RD, LDN Inpatient Clinical Dietitian Pager: [phone] Weekend/After Hours: [phone]

## 2018-05-16 NOTE — Consult Note (Signed)
ANTICOAGULATION CONSULT NOTE - Initial Consult  Pharmacy Consult for Heparin infusion Indication: chest pain/ACS  No Known Allergies  Patient Measurements: Height: 5\' 6"  (167.6 cm) Weight: 147 lb 8 oz (66.9 kg) IBW/kg (Calculated) : 63.8 Heparin Dosing Weight: 65.8  Vital Signs: Temp: 97.6 F (36.4 C) (01/17 0509) Temp Source: Oral (01/17 0509) BP: 139/64 (01/17 0509) Pulse Rate: 57 (01/17 0509)  Labs: Recent Labs    05/15/18 1450 05/15/18 1557 05/15/18 1950 05/15/18 2221 05/16/18 0113 05/16/18 0459  HGB 14.2  --   --   --   --  13.8  HCT 41.5  --   --   --   --  41.4  PLT 201  --   --   --   --  175  APTT  --  31  --   --   --   --   LABPROT  --  12.5  --   --   --   --   INR  --  0.94  --   --   --   --   HEPARINUNFRC  --   --   --  <0.10*  --  0.16*  CREATININE 1.09  --   --   --   --   --   TROPONINI 6.99*  --  6.92*  --  7.40*  --     Estimated Creatinine Clearance: 58.5 mL/min (by C-G formula based on SCr of 1.09 mg/dL).   Medical History: History reviewed. No pertinent past medical history.  Medications:  Per med rec patient only takes ibuprofen on occasion.  Assessment: 68 y.o. male with no past medical history, takes no medications, presents to the emergency department for 2 days of intermittent chest pain.  Goal of Therapy:  Heparin level 0.3-0.7 units/ml Monitor platelets by anticoagulation protocol: Yes   Plan:  01/17 @ 0430 HL 0.16 subtherapeutic. Will rebolus w/ heparin 1500 units IV x 1 and increase rate to 1050 units/hr and will recheck HL @ 1300. CBC stable.  Tobie Lords, PharmD Clinical Pharmacist 05/16/2018,6:39 AM

## 2018-05-16 NOTE — Plan of Care (Signed)
  Problem: Health Behavior/Discharge Planning: Goal: Ability to manage health-related needs will improve Outcome: Progressing   Problem: Pain Managment: Goal: General experience of comfort will improve Outcome: Progressing   Problem: Safety: Goal: Ability to remain free from injury will improve Outcome: Progressing   Problem: Education: Goal: Understanding of cardiac disease, CV risk reduction, and recovery process will improve Outcome: Progressing   Problem: Activity: Goal: Ability to tolerate increased activity will improve Outcome: Progressing

## 2018-05-16 NOTE — Progress Notes (Signed)
Cardiovascular and Pulmonary Nurse Navigator Note:     68 year old male current smoker with no other past medical history who presented to the hospital with c/o chest pain on and off for past several days.  This chest pain was across the chest with radiation to the neck, back, and both arms.  Patient reported using Icy Hot to try and get some relief.  Patient underwent Cardiac Cath today.    Procedures   LEFT HEART CATH AND CORONARY ANGIOGRAPHY poss PCI  Conclusion    Prox Cx to Mid Cx lesion is 80% stenosed.  Dist LM to Ost LAD lesion is 50% stenosed.  Mid LAD lesion is 80% stenosed.  Dist LAD lesion is 75% stenosed.  Ost 1st Diag to 1st Diag lesion is 75% stenosed.  Mid RCA lesion is 50% stenosed.  Mid RCA to Dist RCA lesion is 80% stenosed.  Post Atrio lesion is 90% stenosed.  The left ventricular ejection fraction is 50-55% by visual estimate.  LV end diastolic pressure is mildly elevated.    Final Conclusions:  Severe three-vessel disease, Vessels are small and diffusely calcified  Recommendations:  Medical management recommended given culprit vessel unclear Aspirin, Plavix, long-acting nitrates, high-dose statin, beta-blocker Low-dose ACE inhibitor if blood pressure tolerates --If he continues to have unstable angina symptoms despite maximal medical therapy,  Would consider pharmacologic Myoview to identify region of ischemia, then with high risk stenting if possible  [name] 05/16/2018, 1:07 PM  Echo ordered - not yet completed.    EDUCATION: Patient lying in bed with head of bed elevated.  Wife at bedside.  Patient gave verbal  permission for this RN to speak in front of his wife.    "Heart Attack Bouncing Back" booklet given and reviewed with patient. Discussed the definition of CAD. Reviewed the location of CAD and where his stent was placed. I ? Discussed modifiable risk factors including controlling blood pressure, cholesterol, and blood sugar;  following heart healthy diet; maintaining healthy weight; exercise; and smoking cessation.  ? Discussed cardiac medications including rationale for taking, mechanisms of action, and side effects. Stressed the importance of taking medications as prescribed.  ? Discussed emergency plan for heart attack symptoms. Patient verbalized understanding of need to call 911 and not to drive himself to ER if having cardiac symptoms / chest pain.  ? Heart healthy diet of low sodium, low fat, low cholesterol heart healthy diet discussed. Information on diet provided. Note:  Dietitian consultation for diet education has been ordered.  Pending.   ? Smoking Cessation - Patient is a current every day smoker. Patient reports smoking one pack per day.  Patient reports trying to quit in the past, but only makes it for a day or two before he resumes smoking.  "Thinking about Quitting - Yes You Can!" informational sheet reviewed with patient and wife.   ? Exercise - Benefits of exercised discussed. Patient and wife both report patient is very active, as he is outside from the time he gets up in the morning until the end of the day. Informed patient and wife patient has been referred to outpatient Cardiac Rehab program.  Brief overview of the program provided.  Activities for patient further discussed as patient's anginal symptoms are being treated medically.  Discussed medications and how it will be important for the patient to communicate with his doctor if has ongoing chest pain.  PLAN:  Patient and wife will think about Cardiac Rehab and will see if patient  has ongoing chest pain with activity.    Patient / wife appreciative of the information.  ? Roanna Epley, RN, BSN, [place]  [facility] Cardiac & Pulmonary Rehab  Cardiovascular & Pulmonary Nurse Navigator  Direct Line: [phone]  Department Phone #: [phone] Fax: [phone]  Email Address: [email or web address]

## 2018-05-17 ENCOUNTER — Inpatient Hospital Stay (HOSPITAL_COMMUNITY)
Admit: 2018-05-17 | Discharge: 2018-05-17 | Disposition: A | Discharge status: Home / Self Care | Payer: PPO | Attending: Cardiovascular Disease | Admitting: Cardiovascular Disease

## 2018-05-17 ENCOUNTER — Inpatient Hospital Stay: Admit: 2018-05-17 | Payer: PPO

## 2018-05-17 DIAGNOSIS — I340 Nonrheumatic mitral (valve) insufficiency: Secondary | ICD-10-CM

## 2018-05-17 LAB — HIV ANTIBODY (ROUTINE TESTING W REFLEX): HIV Screen 4th Generation wRfx: NONREACTIVE

## 2018-05-17 LAB — ECHOCARDIOGRAM COMPLETE
Height: 66 in
Weight: 2455.04 oz

## 2018-05-17 LAB — TROPONIN I: Troponin I: 5.69 ng/mL (ref <0.03)

## 2018-05-17 MED ORDER — ISOSORBIDE MONONITRATE ER 30 MG PO TB24: 30.0000 mg | ORAL_TABLET | Freq: Every day | ORAL | 0 refills | Status: DC

## 2018-05-17 NOTE — Progress Notes (Signed)
She has no further chest pain, feeling much better and wants to go home.  Spoke to on-call cardiology to see if he can go home.  Discharge instructions are in the computer.  Can follow[facility]up with [facility] cardiology as an outpatient.  Can have repeat CT chest in 1 year.[name]

## 2018-05-17 NOTE — Progress Notes (Signed)
Patient was able to walk well with the nurse assistant.  Patient was steady on his feet. He ambulated in the hall.  Phillis Knack, RN

## 2018-05-17 NOTE — Progress Notes (Addendum)
Patient will leave in a private vehicle.  RN removed both IVs and gave his telemetry monitor to the secretary.  Pending Echo results... Dr. Darnell Level called and asked for him to have the echo before leaving.  Phillis Knack, RN

## 2018-05-17 NOTE — Progress Notes (Signed)
Dr. Rockey Situ said the echo was "good" and he can go home.  Dr. Vianne Bulls said he can go.  Patient is on his way to his car with his wife.  Phillis Knack, RN

## 2018-05-17 NOTE — Progress Notes (Signed)
Progress Note  Patient Name: VERBON GIANGREGORIO Date of Encounter: 05/17/2018  Primary Cardiologist: Rockey Situ  Subjective   Feeling well without chest pain.  Able to ambulate without symptoms.  Inpatient Medications    Scheduled Meds: . aspirin EC  81 mg Oral Daily  . atorvastatin  40 mg Oral q1800  . clopidogrel  75 mg Oral Daily  . isosorbide mononitrate  30 mg Oral Daily  . metoprolol tartrate  25 mg Oral BID  . nicotine  14 mg Transdermal Daily  . sodium chloride flush  3 mL Intravenous Q12H  . sodium chloride flush  3 mL Intravenous Q12H   Continuous Infusions: . sodium chloride    . sodium chloride     PRN Meds: sodium chloride, sodium chloride, acetaminophen, ALPRAZolam, guaiFENesin-dextromethorphan, hydrALAZINE, nitroGLYCERIN, ondansetron (ZOFRAN) IV, sodium chloride flush, sodium chloride flush, zolpidem   Vital Signs    Vitals:   05/16/18 1640 05/16/18 2013 05/17/18 0441 05/17/18 0734  BP: 122/65 (!) 109/59 (!) 115/57 117/61  Pulse: 64 63 67 62  Resp: 19 16  16   Temp: 98.8 F (37.1 C) 99.3 F (37.4 C) 98.4 F (36.9 C) 98.4 F (36.9 C)  TempSrc: Oral Oral Oral Oral  SpO2: 99% 98% 96% 97%  Weight:      Height:        Intake/Output Summary (Last 24 hours) at 05/17/2018 1123 Last data filed at 05/17/2018 0957 Gross per 24 hour  Intake 1301.8 ml  Output 725 ml  Net 576.8 ml   Last 3 Weights 05/16/2018 05/15/2018 05/15/2018  Weight (lbs) 153 lb 7 oz 147 lb 8 oz 145 lb  Weight (kg) 69.6 kg 66.906 kg 65.772 kg      Telemetry    Sinus rhythm- Personally Reviewed  ECG    Sinus rhythm- Personally Reviewed  Physical Exam   GEN: No acute distress.   Neck: No JVD Cardiac: RRR, no murmurs, rubs, or gallops.  Respiratory: Clear to auscultation bilaterally. GI: Soft, nontender, non-distended  MS: No edema; No deformity. Neuro:  Nonfocal  Psych: Normal affect   Labs    Chemistry Recent Labs  Lab 05/15/18 1450  NA 133*  K 4.0  CL 101  CO2 25    GLUCOSE 109*  BUN 12  CREATININE 1.09  CALCIUM 9.0  GFRNONAA >60  GFRAA >60  ANIONGAP 7     Hematology Recent Labs  Lab 05/15/18 1450 05/16/18 0459  WBC 13.2* 9.5  RBC 4.52 4.46  HGB 14.2 13.8  HCT 41.5 41.4  MCV 91.8 92.8  MCH 31.4 30.9  MCHC 34.2 33.3  RDW 12.3 12.5  PLT 201 175    Cardiac Enzymes Recent Labs  Lab 05/16/18 0113 05/16/18 1328 05/16/18 1938 05/17/18 0131  TROPONINI 7.40* 4.41* 5.36* 5.69*   No results for input(s): TROPIPOC in the last 168 hours.   BNPNo results for input(s): BNP, PROBNP in the last 168 hours.   DDimer No results for input(s): DDIMER in the last 168 hours.   Radiology    Dg Chest 2 View  Result Date: 05/15/2018 CLINICAL DATA:  Chest pain for 5 days EXAM: CHEST - 2 VIEW COMPARISON:  None. FINDINGS: The lungs are hyperinflated likely secondary to COPD. There is spiculated left apical airspace disease. There is no pleural effusion or pneumothorax. The heart size is normal. IMPRESSION: Spiculated left apical airspace disease. Differential considerations include an infectious etiology including atypical infection such as tuberculosis versus malignancy. Followup PA and lateral chest X-ray is  recommended in 3-4 weeks following trial of antibiotic therapy to ensure resolution and exclude underlying malignancy. Electronically Signed   By: Kathreen Devoid   On: 05/15/2018 15:09   Ct Angio Chest Pe W And/or Wo Contrast  Result Date: 05/15/2018 CLINICAL DATA:  Three episodes of throbbing left neck pain with associated pain in both arms and in the chest over the past 4 days. EXAM: CT ANGIOGRAPHY CHEST WITH CONTRAST TECHNIQUE: Multidetector CT imaging of the chest was performed using the standard protocol during bolus administration of intravenous contrast. Multiplanar CT image reconstructions and MIPs were obtained to evaluate the vascular anatomy. CONTRAST:  78mL OMNIPAQUE IOHEXOL 350 MG/ML SOLN COMPARISON:  Chest radiographs dated 05/15/2018.  FINDINGS: Cardiovascular: Atheromatous calcifications, including the coronary arteries and aorta. Normally opacified pulmonary arteries with no pulmonary arterial filling defects seen. Mediastinum/Nodes: Single mildly enlarged left hilar node with a short axis diameter 10 mm on image number 40 series 4. No enlarged mediastinal or axillary nodes. Unremarkable esophagus and thyroid gland. Lungs/Pleura: Patchy airspace opacity in the left upper lobe with air bronchograms. Bilateral bullous changes, most pronounced in the upper lobes. Minimal bilateral dependent atelectasis. Minimal left pleural fluid. Upper Abdomen: Unremarkable. Musculoskeletal: Thoracic and lower cervical spine degenerative changes. Review of the MIP images confirms the above findings. IMPRESSION: 1. Left upper lobe pneumonia. This has a less atypical appearance on the CT compared to the radiographs. Some of the appearance of the radiographs was due to bullous changes in that area. Followup PA and lateral chest X-ray is recommended in 3-4 weeks following trial of antibiotic therapy to ensure resolution and exclude underlying malignancy. 2. No pulmonary emboli. 3. Minimal probable reactive left hilar adenopathy 4. Minimal left pleural fluid. 5. Changes of COPD with paraseptal emphysema. 6. Minimal calcific coronary artery and aortic atherosclerosis. Aortic Atherosclerosis (ICD10-I70.0) and Emphysema (ICD10-J43.9). Electronically Signed   By: Claudie Revering M.D.   On: 05/15/2018 17:00    Cardiac Studies   LHC   Prox Cx to Mid Cx lesion is 80% stenosed.  Dist LM to Ost LAD lesion is 50% stenosed.  Mid LAD lesion is 80% stenosed.  Dist LAD lesion is 75% stenosed.  Ost 1st Diag to 1st Diag lesion is 75% stenosed.  Mid RCA lesion is 50% stenosed.  Mid RCA to Dist RCA lesion is 80% stenosed.  Post Atrio lesion is 90% stenosed.  The left ventricular ejection fraction is 50-55% by visual estimate.  LV end diastolic pressure is mildly  elevated.  Patient Profile     68 y.o. male current smoker who presented to the hospital with a non-STEMI found to have significant coronary artery disease.  Assessment & Plan    1.  Non-STEMI: Currently the patient does not have any further chest pain.  His troponin peaked at 7.4.  He has significant coronary artery disease felt better managed medically.  We Trenton Passow continue aspirin, Plavix, beta-blocker, long-acting nitrates.  Would continue high-dose atorvastatin.  2.  Hyperlipidemia: We Kerina Simoneau continue Lipitor 40 mg.  He Shanie Mauzy need an outpatient check to see if this needs to be increased.  3.  Tobacco abuse: Cessation encouraged  [facility] Sulo Janczak sign off.   Medication Recommendations: Continue aspirin, Plavix, statin, beta-blocker, Imdur Other recommendations (labs, testing, etc): None Follow up as an outpatient: To be arranged by [facility]  For questions or updates, please contact [place] Please consult [email or web address] for contact info under        Signed, Rosea Dory Meredith Leeds, MD  05/17/2018,  11:23 AM

## 2018-05-19 NOTE — Discharge Summary (Signed)
Donald Lowery, is a 68 y.o. male  DOB 03-22-1951  MRN [id number].  Admission date:  05/15/2018  Admitting Physician  Demetrios Loll, MD  Discharge Date:  05/17/2018   Primary MD  Patient, No Pcp Per  Recommendations for primary care physician for things to follow:  Follow-up with [facility] health cardiology, Dr. Rockey Situ 1 week   Admission Diagnosis  neck and arma pin   Discharge Diagnosis  neck and arma pin    Active Problems:   NSTEMI (non-ST elevated myocardial infarction) [name])      History reviewed. No pertinent past medical history.  Past Surgical History:  Procedure Laterality Date  . BRAIN SURGERY    . LEFT HEART CATH AND CORONARY ANGIOGRAPHY N/A 05/16/2018   Procedure: LEFT HEART CATH AND CORONARY ANGIOGRAPHY poss PCI;  Surgeon: [name], MD;  Location: [place] CV LAB;  Service: Cardiovascular;  Laterality: N/A;       History of present illness and  Hospital Course:     Kindly see H&P for history of present illness and admission details, please review complete Labs, Consult reports and Test reports for all details in brief  HPI  from the history and physical done on the day of admission  68 year old male with no past medical history comes in because of chest pain and found to have elevated troponins up to 7.4, admitted for non-ST elevation MI.    Hospital Course  #1. leftchest pain due to non-ST elevation MI troponin peaked up to 7.4,  Received aspirin, beta-blockers, high intensity statins, nitrates, patient seen by [facility] health cardiology, EKG showed normal sinus rhythm with 80 bpm.,  Cardiac cath showed diffuse disease in proximal circumflex to mid circumflex, mid LAD, distal LAD, mid RCA, EF 50 to 55%.  Cardiology recommended medical management including aspirin, Plavix, long-acting nitrates, high  intensity statins, medical management recommended given culprit vessel is unclear.  Discharged home in stable condition did not have any further chest pain after medication, cardiac cath.  Pt  referred to cardiac rehab.  Discharge Condition: stable  Follow UP  Follow-up Information    [facility] Cardiac [facility] Pulmonary Rehab Follow up.   Specialty:  Cardiac Rehabilitation Why:  Your cardiologist has referred you to outpatient Cardiac Rehab at [facility].  The Cardiac Rehab department will contact you within one week of discharge to schedule your first appointment.   Contact information: [place] [id number] [address] [place] [phone]       [name], MD. Schedule an appointment as soon as possible for a visit in 1 week(s).   Specialty:  Cardiology Contact information: [place] [address] [phone]             Discharge Instructions  and  Discharge Medications    Discharge Instructions    AMB Referral to Cardiac Rehabilitation - Phase II   Complete by:  As directed    Diagnosis:  NSTEMI     Allergies as of 05/17/2018   No Known Allergies     Medication List    TAKE these medications   aspirin 81 MG EC tablet Take 1 tablet (81 mg total) by mouth daily.   atorvastatin 40 MG tablet Commonly known as:  LIPITOR Take 1 tablet (40 mg total) by mouth daily at 6 PM.   clopidogrel 75 MG tablet Commonly known as:  PLAVIX Take 1 tablet (75 mg total) by mouth daily.   ibuprofen 200 MG  tablet Commonly known as:  ADVIL,MOTRIN Take 200-400 mg by mouth every 6 (six) hours as needed for mild pain or moderate pain.   isosorbide mononitrate 30 MG 24 hr tablet Commonly known as:  IMDUR Take 1 tablet (30 mg total) by mouth daily.   metoprolol tartrate 25 MG tablet Commonly known as:  LOPRESSOR Take 1 tablet (25 mg total) by mouth 2 (two) times daily.         Diet and Activity recommendation: See Discharge  Instructions above   Consults obtained -cardiology   Major procedures and Radiology Reports - PLEASE review detailed and final reports for all details, in brief -     Dg Chest 2 View  Result Date: 05/15/2018 CLINICAL DATA:  Chest pain for 5 days EXAM: CHEST - 2 VIEW COMPARISON:  None. FINDINGS: The lungs are hyperinflated likely secondary to COPD. There is spiculated left apical airspace disease. There is no pleural effusion or pneumothorax. The heart size is normal. IMPRESSION: Spiculated left apical airspace disease. Differential considerations include an infectious etiology including atypical infection such as tuberculosis versus malignancy. Followup PA and lateral chest X-ray is recommended in 3-4 weeks following trial of antibiotic therapy to ensure resolution and exclude underlying malignancy. Electronically Signed   By: Kathreen Devoid   On: 05/15/2018 15:09   Ct Angio Chest Pe W And/or Wo Contrast  Result Date: 05/15/2018 CLINICAL DATA:  Three episodes of throbbing left neck pain with associated pain in both arms and in the chest over the past 4 days. EXAM: CT ANGIOGRAPHY CHEST WITH CONTRAST TECHNIQUE: Multidetector CT imaging of the chest was performed using the standard protocol during bolus administration of intravenous contrast. Multiplanar CT image reconstructions and MIPs were obtained to evaluate the vascular anatomy. CONTRAST:  8mL OMNIPAQUE IOHEXOL 350 MG/ML SOLN COMPARISON:  Chest radiographs dated 05/15/2018. FINDINGS: Cardiovascular: Atheromatous calcifications, including the coronary arteries and aorta. Normally opacified pulmonary arteries with no pulmonary arterial filling defects seen. Mediastinum/Nodes: Single mildly enlarged left hilar node with a short axis diameter 10 mm on image number 40 series 4. No enlarged mediastinal or axillary nodes. Unremarkable esophagus and thyroid gland. Lungs/Pleura: Patchy airspace opacity in the left upper lobe with air bronchograms. Bilateral  bullous changes, most pronounced in the upper lobes. Minimal bilateral dependent atelectasis. Minimal left pleural fluid. Upper Abdomen: Unremarkable. Musculoskeletal: Thoracic and lower cervical spine degenerative changes. Review of the MIP images confirms the above findings. IMPRESSION: 1. Left upper lobe pneumonia. This has a less atypical appearance on the CT compared to the radiographs. Some of the appearance of the radiographs was due to bullous changes in that area. Followup PA and lateral chest X-ray is recommended in 3-4 weeks following trial of antibiotic therapy to ensure resolution and exclude underlying malignancy. 2. No pulmonary emboli. 3. Minimal probable reactive left hilar adenopathy 4. Minimal left pleural fluid. 5. Changes of COPD with paraseptal emphysema. 6. Minimal calcific coronary artery and aortic atherosclerosis. Aortic Atherosclerosis (ICD10-I70.0) and Emphysema (ICD10-J43.9). Electronically Signed   By: Claudie Revering M.D.   On: 05/15/2018 17:00    Micro Results    Recent Results (from the past 240 hour(s))  Blood culture (routine x 2)     Status: None (Preliminary result)   Collection Time: 05/15/18  6:09 PM  Result Value Ref Range Status   Specimen Description BLOOD LEFT ANTECUBITAL  Final   Special Requests   Final    BOTTLES DRAWN AEROBIC AND ANAEROBIC Blood Culture adequate volume   Culture  Final    NO GROWTH 4 DAYS Performed at [facility], [address] [place], [place] [address]    Report Status PENDING  Incomplete  Blood culture (routine x 2)     Status: None (Preliminary result)   Collection Time: 05/15/18  6:09 PM  Result Value Ref Range Status   Specimen Description BLOOD RIGHT ANTECUBITAL  Final   Special Requests   Final    BOTTLES DRAWN AEROBIC AND ANAEROBIC Blood Culture results may not be optimal due to an excessive volume of blood received in culture bottles   Culture   Final    NO GROWTH 4 DAYS Performed at [facility],  [address] [place], [place] [address]    Report Status PENDING  Incomplete       Today   Subjective:   Donald Lowery today has no headache,no chest abdominal pain,no new weakness tingling or numbness, feels much better wants to go home today.   Objective:   Blood pressure 117/61, pulse 62, temperature 98.4 F (36.9 C), temperature source Oral, resp. rate 16, height 5\' 6"  (1.676 m), weight 69.6 kg, SpO2 97 %.  No intake or output data in the 24 hours ending 05/19/18 1145  Exam Awake Alert, Oriented x 3, No new F.N deficits, Normal affect [place].AT,PERRAL Supple Neck,No JVD, No cervical lymphadenopathy appriciated.  Symmetrical Chest wall movement, Good air movement bilaterally, CTAB RRR,No Gallops,Rubs or new Murmurs, No Parasternal Heave +ve B.Sounds, Abd Soft, Non tender, No organomegaly appriciated, No rebound -guarding or rigidity. No Cyanosis, Clubbing or edema, No new Rash or bruise  Data Review   CBC w Diff:  Lab Results  Component Value Date   WBC 9.5 05/16/2018   HGB 13.8 05/16/2018   HCT 41.4 05/16/2018   PLT 175 05/16/2018    CMP:  Lab Results  Component Value Date   NA 133 (L) 05/15/2018   K 4.0 05/15/2018   CL 101 05/15/2018   CO2 25 05/15/2018   BUN 12 05/15/2018   CREATININE 1.09 05/15/2018  .   Total Time in preparing paper work, data evaluation and todays exam - 35 minutes  Epifanio Lesches M.D on 05/17/2018 at 11:45 AM    Note: This dictation was prepared with Dragon dictation along with smaller phrase technology. Any transcriptional errors that result from this process are unintentional.

## 2018-05-20 LAB — CULTURE, BLOOD (ROUTINE X 2)
CULTURE: NO GROWTH
Culture: NO GROWTH
Special Requests: ADEQUATE

## 2018-06-05 ENCOUNTER — Encounter: Payer: Self-pay | Admitting: Nurse Practitioner

## 2018-06-05 ENCOUNTER — Ambulatory Visit (INDEPENDENT_AMBULATORY_CARE_PROVIDER_SITE_OTHER): Payer: PPO | Admitting: Nurse Practitioner

## 2018-06-05 VITALS — BP 140/70 | HR 52 | Ht 64.0 in | Wt 154.8 lb

## 2018-06-05 DIAGNOSIS — I2510 Atherosclerotic heart disease of native coronary artery without angina pectoris: Secondary | ICD-10-CM | POA: Diagnosis not present

## 2018-06-05 DIAGNOSIS — E785 Hyperlipidemia, unspecified: Secondary | ICD-10-CM

## 2018-06-05 DIAGNOSIS — Z720 Tobacco use: Secondary | ICD-10-CM | POA: Diagnosis not present

## 2018-06-05 DIAGNOSIS — I214 Non-ST elevation (NSTEMI) myocardial infarction: Secondary | ICD-10-CM

## 2018-06-05 MED ORDER — ISOSORBIDE MONONITRATE ER 30 MG PO TB24: 30.0000 mg | ORAL_TABLET | Freq: Every day | ORAL | 3 refills | Status: DC

## 2018-06-05 MED ORDER — METOPROLOL TARTRATE 25 MG PO TABS: 25.0000 mg | ORAL_TABLET | Freq: Two times a day (BID) | ORAL | 3 refills | Status: DC

## 2018-06-05 MED ORDER — ATORVASTATIN CALCIUM 40 MG PO TABS: 40.0000 mg | ORAL_TABLET | Freq: Every day | ORAL | 3 refills | Status: DC

## 2018-06-05 MED ORDER — NITROGLYCERIN 0.4 MG SL SUBL: 0.4000 mg | SUBLINGUAL_TABLET | SUBLINGUAL | 3 refills | Status: DC | PRN

## 2018-06-05 MED ORDER — CLOPIDOGREL BISULFATE 75 MG PO TABS: 75.0000 mg | ORAL_TABLET | Freq: Every day | ORAL | 3 refills | Status: DC

## 2018-06-05 NOTE — Progress Notes (Signed)
Office Visit    Patient Name: Donald Lowery Date of Encounter: 06/05/2018  Primary Care Provider:  Patient, No Pcp Per Primary Cardiologist:  [name], MD  Chief Complaint    68 year old male with a history of tobacco abuse, who presents for follow-up after recent non-STEMI.  Past Medical History    Past Medical History:  Diagnosis Date  . Coronary artery disease    a. 04/2018 NSTEMI/Cath: LM nl, LAD 50p, 59m/d diffuse-small caliber, LCX heavily Ca2+ sev prox/mid dzs, RCA 90 diff distal dzs into RPL and RPDA. EF 50-55%-->Med Rx.  [name] History of echocardiogram    a. 04/2018 Echo: EF 55-60%, no rwma, mild MR, mildly dil LA. Nl RV fxn.  . Hyperlipidemia   . NSTEMI (non-ST elevated myocardial infarction) ([place]) 05/15/2018  . Tobacco abuse    a. Quit 04/2018.   Past Surgical History:  Procedure Laterality Date  . BRAIN SURGERY    . CARDIAC CATHETERIZATION    . LEFT HEART CATH AND CORONARY ANGIOGRAPHY N/A 05/16/2018   Procedure: LEFT HEART CATH AND CORONARY ANGIOGRAPHY poss PCI;  Surgeon: [name], MD;  Location: [place] [facility];  Service: Cardiovascular;  Laterality: N/A;    Allergies  No Known Allergies  History of Present Illness    68 year old male with a prior history of tobacco abuse.  He had not previously followed with primary care.  On May 15, 2018, he was admitted to [facility] regional with a 3-day history of intermittent retrosternal chest discomfort that radiated to his back and was associated with nausea and dyspnea.  Symptoms initially occurred with activity and resolved within 15 to 30 minutes but on the day of admission, he had symptoms that awoke him from sleep.  He ruled in for non-STEMI, eventually peaking his troponin at 7.40.  Echocardiogram showed normal LV function.  He underwent diagnostic catheterization which revealed severe, diffuse, distal and small vessel disease without a clear culprit.  Recommendation was made for medical  therapy.  Since discharge, he reports having done well.  He is slowly increasing activity.  He has not had any recurrent chest or back pain, dyspnea, or nausea.  He says he has quit smoking.  He does not think he is interested in cardiac rehab.  He denies palpitations, PND, orthopnea, dizziness, syncope, edema, or early satiety.  He does think his medications are causing him to have some daytime fatigue.  He notes that he has never taken medications before and says he does not really know what to expect.  At this point, he does not think that symptoms are particularly significant, just notable.  Home Medications    Prior to Admission medications   Medication Sig Start Date End Date Taking? Authorizing Provider  aspirin EC 81 MG EC tablet Take 1 tablet (81 mg total) by mouth daily. 05/17/18  Yes Hillary Bow, MD  atorvastatin (LIPITOR) 40 MG tablet Take 1 tablet (40 mg total) by mouth daily at 6 PM. 05/16/18  Yes Sudini, Artist, MD  clopidogrel (PLAVIX) 75 MG tablet Take 1 tablet (75 mg total) by mouth daily. 05/17/18  Yes Sudini, Alveta Heimlich, MD  ibuprofen (ADVIL,MOTRIN) 200 MG tablet Take 200-400 mg by mouth every 6 (six) hours as needed for mild pain or moderate pain.   Yes [provider]  isosorbide mononitrate (IMDUR) 30 MG 24 hr tablet Take 1 tablet (30 mg total) by mouth daily. 05/17/18  Yes Epifanio Lesches, MD  metoprolol tartrate (LOPRESSOR) 25 MG tablet Take  1 tablet (25 mg total) by mouth 2 (two) times daily. 05/16/18  Yes Sudini, Alveta Heimlich, MD    Review of Systems    Some fatigue since MI and new meds.  He denies chest pain, palpitations, dyspnea, pnd, orthopnea, n, v, dizziness, syncope, edema, weight gain, or early satiety.  All other systems reviewed and are otherwise negative except as noted above.  Physical Exam    VS:  BP 140/70 (BP Location: Left Arm, Patient Position: Sitting, Cuff Size: Normal)   Pulse (!) 52   Ht 5\' 4"  (1.626 m)   Wt 154 lb 12 oz (70.2 kg)   BMI  26.56 kg/m  , BMI Body mass index is 26.56 kg/m. GEN: Well nourished, well developed, in no acute distress. HEENT: normal. Neck: Supple, no JVD, carotid bruits, or masses. Cardiac: RRR, no murmurs, rubs, or gallops. No clubbing, cyanosis, edema.  Radials/PT 2+ and equal bilaterally.  R groin cath site w/o bleeding/bruit/hematoma. Respiratory:  Respirations regular and unlabored, clear to auscultation bilaterally. GI: Soft, nontender, nondistended, BS + x 4. MS: no deformity or atrophy. Skin: warm and dry, no rash. Neuro:  Strength and sensation are intact. Psych: Normal affect.  Accessory Clinical Findings    ECG personally reviewed by me today - SB, 52, LAD - no acute changes.  Lab Results  Component Value Date   LDLCALC 166 (H) 05/16/2018     Assessment & Plan    1.  NSTEMI, subsequent episode of care/CAD: s/p recent admission w/ chest and back pain assoc w/ dyspnea and nausea.  He r/i and was found to have severe, diffuse, small vessel CAD and nl LV fxn.  Med rx recommended w/ a plan to pursue stress testing for further symptoms in order to better identify areas of high risk.  Fortunately, he has been feeling well without any recurrent symptoms or limitations up to this point.  He has had some fatigue, which I suspect is secondary to beta-blocker therapy.  I have recommended he continue beta-blocker for now as is only been 2 weeks since his MI and we can see if this resolves within the next month or so.  I will refill his aspirin, Plavix, statin, and long-acting nitrate today.  I have also provided him with a prescription for sublingual nitroglycerin.  I have recommended that he consider cardiac rehabilitation but he says he is not interested.    2.  Hyperlipidemia: LDL was 166 on January 17.  Plan to follow-up repeat lipids and LFTs in about another month.  Continue high potency statin therapy.  May need to consider additional agent such as Zetia or PCSK9 inhibitor pending  results.  3.  Tobacco abuse: He has quit.  I congratulated him on this and encouraged him to remain off of cigarettes.  4.  Elevated blood pressure: Blood pressure 140/70 today.  He is not short runs at home.  Continue beta-blocker.  As he has been having some fatigue, I am reluctant to add an additional agent at this point but if pressure remains elevated at follow-up, we can consider addition of an ARB.  5.  Fatigue: Some fatigue and daytime sleepiness since MI.  I suspect this may be secondary to beta-blocker therapy.  He is willing to continue beta-blocker for now we can readdress symptoms at follow-up in 1 month.  6.  Disposition: Follow-up in 1 month with lipids and LFTs at that time.   Murray Hodgkins, NP 06/05/2018, 2:59 PM

## 2018-06-05 NOTE — Patient Instructions (Signed)
Medication Instructions:  Your physician has recommended you make the following change in your medication:  1- TAKE AS NEEDED- Nitroglycerin Place 1 tablet (0.4 mg total) under the tongue every 5 (five) minutes as needed for chest pain. Max 3, if still unresolved seek immediate medical care.   If you need a refill on your cardiac medications before your next appointment, please call your pharmacy.   Lab work: Your physician recommends that you return for lab work in: 1 month at your next visit. Please remain fasting.   If you have labs (blood work) drawn today and your tests are completely normal, you will receive your results only by: [name] MyChart Message (if you have MyChart) OR . A paper copy in the mail If you have any lab test that is abnormal or we need to change your treatment, we will call you to review the results.  Testing/Procedures: None ordered   Follow-Up: At [facility], you and your health needs are our priority.  As part of our continuing mission to provide you with exceptional heart care, we have created designated Provider Care Teams.  These Care Teams include your primary Cardiologist (physician) and Advanced Practice Providers (APPs -  Physician Assistants and Nurse Practitioners) who all work together to provide you with the care you need, when you need it. You will need a follow up appointment in 1 months.  You may see [name], MD or Murray Hodgkins, NP

## 2018-06-10 ENCOUNTER — Other Ambulatory Visit: Payer: Self-pay | Admitting: Cardiovascular Disease

## 2018-06-10 ENCOUNTER — Other Ambulatory Visit: Payer: Self-pay

## 2018-06-10 MED ORDER — METOPROLOL TARTRATE 25 MG PO TABS: 25.0000 mg | ORAL_TABLET | Freq: Two times a day (BID) | ORAL | 3 refills | Status: DC

## 2018-06-10 MED ORDER — CLOPIDOGREL BISULFATE 75 MG PO TABS: 75.0000 mg | ORAL_TABLET | Freq: Every day | ORAL | 3 refills | Status: DC

## 2018-06-10 MED ORDER — ATORVASTATIN CALCIUM 40 MG PO TABS: 40.0000 mg | ORAL_TABLET | Freq: Every day | ORAL | 3 refills | Status: DC

## 2018-06-10 MED ORDER — ISOSORBIDE MONONITRATE ER 30 MG PO TB24: 30.0000 mg | ORAL_TABLET | Freq: Every day | ORAL | 3 refills | Status: DC

## 2018-06-10 NOTE — Telephone Encounter (Signed)
Requested Prescriptions   Signed Prescriptions Disp Refills  . isosorbide mononitrate (IMDUR) 30 MG 24 hr tablet 90 tablet 3    Sig: Take 1 tablet (30 mg total) by mouth daily.    Authorizing Provider: Theora Gianotti    Ordering User: Janan Ridge atorvastatin (LIPITOR) 40 MG tablet 90 tablet 3    Sig: Take 1 tablet (40 mg total) by mouth daily at 6 PM.    Authorizing Provider: Murray Hodgkins RONALD    Ordering User: Janan Ridge  . clopidogrel (PLAVIX) 75 MG tablet 90 tablet 3    Sig: Take 1 tablet (75 mg total) by mouth daily.    Authorizing Provider: Theora Gianotti    Ordering User: Janan Ridge metoprolol tartrate (LOPRESSOR) 25 MG tablet 180 tablet 3    Sig: Take 1 tablet (25 mg total) by mouth 2 (two) times daily.    Authorizing Provider: Theora Gianotti    Ordering User: Janan Ridge

## 2018-06-10 NOTE — Telephone Encounter (Signed)
°*  STAT* If patient is at the pharmacy, call can be transferred to refill team.   1. Which medications need to be refilled? (please list name of each medication and dose if known)     Isosorbide 30 mg po q d     Atorvastatin 40 mg po q d    Plavix 75 mg po q d     Metoprolol 25 mg po BID  2. Which pharmacy/location (including street and city if local pharmacy) is medication to be sent to? [facility] [place]   3. Do they need a 30 day or 90 day supply? [place]

## 2018-07-01 NOTE — Progress Notes (Incomplete)
Office Visit    Patient Name: BRYAN GOIN Date of Encounter: 07/01/2018  Primary Care Provider:  Patient, No Pcp Per Primary Cardiologist:  [name], MD  Chief Complaint    68 year old male with a history of tobacco abuse, diffuse multivessel CAD status post non-STEMI in January 2020, and hyperlipidemia who presents for follow-up related to CAD.  Past Medical History    Past Medical History:  Diagnosis Date   Coronary artery disease    a. 04/2018 NSTEMI/Cath: LM nl, LAD 50p, 67m/d diffuse-small caliber, LCX heavily Ca2+ sev prox/mid dzs, RCA 90 diff distal dzs into RPL and RPDA. EF 50-55%-->Med Rx.   History of echocardiogram    a. 04/2018 Echo: EF 55-60%, no rwma, mild MR, mildly dil LA. Nl RV fxn.   Hyperlipidemia    NSTEMI (non-ST elevated myocardial infarction) ([place]) 05/15/2018   Tobacco abuse    a. Quit 04/2018.   Past Surgical History:  Procedure Laterality Date   BRAIN SURGERY     CARDIAC CATHETERIZATION     LEFT HEART CATH AND CORONARY ANGIOGRAPHY N/A 05/16/2018   Procedure: LEFT HEART CATH AND CORONARY ANGIOGRAPHY poss PCI;  Surgeon: [name], MD;  Location: [place] [facility];  Service: Cardiovascular;  Laterality: N/A;    Allergies  No Known Allergies  History of Present Illness    68 year old male with the above past medical history including tobacco abuse, CAD status post non-STEMI in January 2020, and hyperlipidemia.  He was admitted to [facility] regional on January 16 with intermittent retrosternal chest pain and ruled in for non-STEMI.  Echo showed normal LV function.  Diagnostic catheterization revealed severe, diffuse, distal and small vessel disease without a clear culprit.  He has been medically managed.  He was last seen in clinic on February 6, at which time he was doing well with the exception of some daytime fatigue.  He had quit smoking. ***  Home Medications    Prior to Admission medications   Medication Sig Start Date  End Date Taking? Authorizing Provider  aspirin EC 81 MG EC tablet Take 1 tablet (81 mg total) by mouth daily. 05/17/18   Hillary Bow, MD  atorvastatin (LIPITOR) 40 MG tablet Take 1 tablet (40 mg total) by mouth daily at 6 PM. 06/10/18   Theora Gianotti, NP  clopidogrel (PLAVIX) 75 MG tablet Take 1 tablet (75 mg total) by mouth daily. 06/10/18   Theora Gianotti, NP  ibuprofen (ADVIL,MOTRIN) 200 MG tablet Take 200-400 mg by mouth every 6 (six) hours as needed for mild pain or moderate pain.    [provider]  isosorbide mononitrate (IMDUR) 30 MG 24 hr tablet Take 1 tablet (30 mg total) by mouth daily. 06/10/18   Theora Gianotti, NP  metoprolol tartrate (LOPRESSOR) 25 MG tablet Take 1 tablet (25 mg total) by mouth 2 (two) times daily. 06/10/18   Theora Gianotti, NP  nitroGLYCERIN (NITROSTAT) 0.4 MG SL tablet Place 1 tablet (0.4 mg total) under the tongue every 5 (five) minutes as needed for chest pain. 06/05/18 09/03/18  Theora Gianotti, NP    Review of Systems    ***. He denies chest pain, palpitations, dyspnea, pnd, orthopnea, n, v, dizziness, syncope, edema, weight gain, or early satiety. All other systems reviewed and are otherwise negative except as noted above.  Physical Exam    VS:  There were no vitals taken for this visit. , BMI There is no height or weight on file  to calculate BMI. GEN: Well nourished, well developed, in no acute distress. HEENT: normal. Neck: Supple, no JVD, carotid bruits, or masses. Cardiac: RRR, no murmurs, rubs, or gallops. No clubbing, cyanosis, edema.  Radials/DP/PT 2+ and equal bilaterally.  Respiratory:  Respirations regular and unlabored, clear to auscultation bilaterally. GI: Soft, nontender, nondistended, BS + x 4. MS: no deformity or atrophy. Skin: warm and dry, no rash. Neuro:  Strength and sensation are intact. Psych: Normal affect.  Accessory Clinical Findings    ECG personally reviewed by me  today - *** - no acute changes.  Assessment & Plan    1.  ***  I, Margit Banda am acting as a Education administrator for Murray Hodgkins, NP.  {Add scribe attestation statement}  Murray Hodgkins, NP 07/01/2018, 5:33 PM

## 2018-07-04 ENCOUNTER — Ambulatory Visit: Payer: PPO | Admitting: Nurse Practitioner

## 2018-07-07 ENCOUNTER — Encounter: Payer: Self-pay | Admitting: Nurse Practitioner

## 2018-07-17 ENCOUNTER — Telehealth: Payer: Self-pay | Admitting: Cardiovascular Disease

## 2018-07-17 NOTE — Telephone Encounter (Signed)
Patient spouse calling  Patient had an appointment with Angelica Ran on 3/6  Patient missed appointment and is calling to have it rescheduled Please advise on an appointment

## 2018-07-17 NOTE — Telephone Encounter (Signed)
     Cardiac Questionnaire:    Since your last visit or hospitalization:    1. Have you been having new or worsening chest pain? NO   2. Have you been having new or worsening shortness of breath? NO 3. Have you been having new or worsening leg swelling, wt gain, or increase in abdominal girth (pants fitting more tightly)? NO   4. Have you had any passing out spells? NO    *A YES to any of these questions would result in the appointment being kept. *If all the answers to these questions are NO, we should indicate that given the current situation regarding the worldwide coronarvirus pandemic, at the recommendation of the CDC, we are looking to limit gatherings in our waiting area, and thus will reschedule their appointment beyond four weeks from today.   _____________      Primary Cardiologist:  [name], MD   Patient contacted.  History reviewed.  No symptoms to suggest any unstable cardiac conditions.  Based on discussion, with current pandemic situation, we will be postponing this appointment for @PATIENTNAME @.  If symptoms change, he has been instructed to contact our office.  Routing to C19 CANCEL pool for tracking (P CV DIV CV19 CANCEL).  Roney Jaffe, RN  07/17/2018 10:41 AM           .

## 2018-07-25 NOTE — Telephone Encounter (Signed)
Lm to call office

## 2018-07-25 NOTE — Telephone Encounter (Signed)
Patient denied e visit  Scheduled in office Angelica Ran on 5/6

## 2018-08-01 ENCOUNTER — Telehealth: Payer: PPO | Admitting: Nurse Practitioner

## 2018-09-03 ENCOUNTER — Ambulatory Visit: Payer: PPO | Admitting: Nurse Practitioner

## 2018-11-13 ENCOUNTER — Ambulatory Visit (INDEPENDENT_AMBULATORY_CARE_PROVIDER_SITE_OTHER): Payer: PPO | Admitting: General Practice

## 2018-11-13 ENCOUNTER — Telehealth: Payer: Self-pay | Admitting: Cardiovascular Disease

## 2018-11-13 ENCOUNTER — Other Ambulatory Visit: Payer: Self-pay

## 2018-11-13 ENCOUNTER — Encounter: Payer: Self-pay | Admitting: General Practice

## 2018-11-13 VITALS — BP 136/58 | HR 45 | Ht 66.0 in | Wt 157.5 lb

## 2018-11-13 DIAGNOSIS — R001 Bradycardia, unspecified: Secondary | ICD-10-CM

## 2018-11-13 DIAGNOSIS — Z79899 Other long term (current) drug therapy: Secondary | ICD-10-CM

## 2018-11-13 DIAGNOSIS — E785 Hyperlipidemia, unspecified: Secondary | ICD-10-CM

## 2018-11-13 DIAGNOSIS — I2510 Atherosclerotic heart disease of native coronary artery without angina pectoris: Secondary | ICD-10-CM

## 2018-11-13 DIAGNOSIS — Z720 Tobacco use: Secondary | ICD-10-CM

## 2018-11-13 DIAGNOSIS — I10 Essential (primary) hypertension: Secondary | ICD-10-CM

## 2018-11-13 MED ORDER — LOSARTAN POTASSIUM 25 MG PO TABS: 25.0000 mg | ORAL_TABLET | Freq: Every day | ORAL | 3 refills | Status: DC

## 2018-11-13 MED ORDER — METOPROLOL TARTRATE 25 MG PO TABS: 12.5000 mg | ORAL_TABLET | Freq: Every day | ORAL | 3 refills | Status: DC

## 2018-11-13 NOTE — Telephone Encounter (Signed)
After appointment patient and wife have some confusion over new medication dosages. The medication in question is Metoprolol, whether he should take 1 tablet or half a tablet. Please advise

## 2018-11-13 NOTE — Telephone Encounter (Signed)
Pt's wife aware for pt to decrease his Metoprolol to 12.5 mg bid from 25 mg bid .Adonis Housekeeper

## 2018-11-13 NOTE — Patient Instructions (Signed)
Medication Instructions:  DECREASE: Metoprolol to 12.5 mg once a day   START: Losartan 25 mg once a day   If you need a refill on your cardiac medications before your next appointment, please call your pharmacy.   Lab work: FUTURE: BMET, [place] in 1 week ([facility] be [facility] mall)  If you have labs (blood work) drawn today and your tests are completely normal, you will receive your results only by: [name] MyChart Message (if you have MyChart) OR . A paper copy in the mail If you have any lab test that is abnormal or we need to change your treatment, we will call you to review the results.  Testing/Procedures: None  Follow-Up: At [facility], you and your health needs are our priority.  As part of our continuing mission to provide you with exceptional heart care, we have created designated Provider Care Teams.  These Care Teams include your primary Cardiologist (physician) and Advanced Practice Providers (APPs -  Physician Assistants and Nurse Practitioners) who all work together to provide you with the care you need, when you need it. You will need a follow up appointment in 1 months.  Please call our office 2 months in advance to schedule this appointment.  You may see [name], MD or one of the following Advanced Practice Providers on your designated Care Team:   Murray Hodgkins, NP Christell Faith, PA-C . Marrianne Mood, PA-C  Any Other Special Instructions Will Be Listed Below (If Applicable).

## 2018-11-13 NOTE — Progress Notes (Addendum)
Cardiology Clinic Note   Patient Name: Donald Lowery Date of Encounter: 11/13/2018  Primary Care Provider:  Patient, No Pcp Per Primary Cardiologist:  Donald Rogue, MD  Patient Profile   Donald Lowery 68 year old male, presents to the clinic today for a 50-month follow-up status post non-STEMI. Past Medical History    Past Medical History:  Diagnosis Date  . Coronary artery disease    a. 04/2018 NSTEMI/Cath: LM nl, LAD 50p, 40m/d diffuse-small caliber, LCX heavily Ca2+ sev prox/mid dzs, RCA 90 diff distal dzs into RPL and RPDA. EF 50-55%-->Med Rx.  [name] History of echocardiogram    a. 04/2018 Echo: EF 55-60%, no rwma, mild MR, mildly dil LA. Nl RV fxn.  . Hyperlipidemia   . NSTEMI (non-ST elevated myocardial infarction) ([place]) 05/15/2018  . Tobacco abuse    a. Quit 04/2018.   Past Surgical History:  Procedure Laterality Date  . BRAIN SURGERY    . CARDIAC CATHETERIZATION    . LEFT HEART CATH AND CORONARY ANGIOGRAPHY N/A 05/16/2018   Procedure: LEFT HEART CATH AND CORONARY ANGIOGRAPHY poss PCI;  Surgeon: [name], MD;  Location: [place] [facility];  Service: Cardiovascular;  Laterality: N/A;    Allergies  No Known Allergies  History of Present Illness    [name] last saw Donald Lowery on 06/05/2018 after a non-STEMI (04/2018) his coronary angiography showed 80% stenosis proximal circumflex to mid circumflex lesion, 50% stenosis distal LM to OST LAD lesion, 80% stenosis mid LAD lesion, 75% stenosis distal LAD lesion, 75% stenosis first diagonal lesion, 50% stenosis mid RCA lesion, 80% stenosis distal RCA lesion, and post Atrio lesion 90% stenosed.  LVEF 50 to 55% and LVEDP mildly elevated.  Medical management was recommended.  During that time he had tolerated medical management well and was slowly increasing his activity.  He had no recurrent episodes of chest pain/back pain/dyspnea/nausea.  His other PMH includes a history of tobacco abuse, hyperlipidemia, and  brain surgery.  He presents to the clinic today with his wife and states he feels well.  He has resumed all of his usual daily activities.  He maintains his property and continues to mow, weed eat, and work in his shop.  He enjoys working on his [employer], jeep, and pickup truck.  He denies chest pain, shortness of breath, lower extremity edema, palpitations, fatigue, weakness, presyncope, syncope, melena, hematuria, hemoptysis, orthopnea and PND.  Home Medications    Prior to Admission medications   Medication Sig Start Date End Date Taking? Authorizing Provider  aspirin EC 81 MG EC tablet Take 1 tablet (81 mg total) by mouth daily. 05/17/18   Hillary Bow, MD  atorvastatin (LIPITOR) 40 MG tablet Take 1 tablet (40 mg total) by mouth daily at 6 PM. 06/10/18   Theora Gianotti, NP  clopidogrel (PLAVIX) 75 MG tablet Take 1 tablet (75 mg total) by mouth daily. 06/10/18   Theora Gianotti, NP  ibuprofen (ADVIL,MOTRIN) 200 MG tablet Take 200-400 mg by mouth every 6 (six) hours as needed for mild pain or moderate pain.    [provider]  isosorbide mononitrate (IMDUR) 30 MG 24 hr tablet Take 1 tablet (30 mg total) by mouth daily. 06/10/18   Theora Gianotti, NP  metoprolol tartrate (LOPRESSOR) 25 MG tablet Take 1 tablet (25 mg total) by mouth 2 (two) times daily. 06/10/18   Theora Gianotti, NP  nitroGLYCERIN (NITROSTAT) 0.4 MG SL tablet Place 1 tablet (0.4 mg total) under the  tongue every 5 (five) minutes as needed for chest pain. 06/05/18 09/03/18  Theora Gianotti, NP    Family History    Family History  Problem Relation Age of Onset  . Heart Problems Mother   . Heart attack Father   . Diabetes Brother   . Heart Problems Brother    He indicated that his mother is deceased. He indicated that his father is deceased. He indicated that the status of his brother is unknown.  Social History    Social History   Socioeconomic History  . Marital  status: Married    Spouse name: Not on file  . Number of children: Not on file  . Years of education: Not on file  . Highest education level: Not on file  Occupational History  . Not on file  Social Needs  . Financial resource strain: Not on file  . Food insecurity    Worry: Not on file    Inability: Not on file  . Transportation needs    Medical: Not on file    Non-medical: Not on file  Tobacco Use  . Smoking status: Former Smoker    Packs/day: 1.00    Years: 45.00    Pack years: 45.00    Types: Cigarettes    Quit date: 05/15/2018    Years since quitting: 0.4  . Smokeless tobacco: Never Used  Substance and Sexual Activity  . Alcohol use: Never    Frequency: Never  . Drug use: Never  . Sexual activity: Not on file  Lifestyle  . Physical activity    Days per week: Not on file    Minutes per session: Not on file  . Stress: Not on file  Relationships  . Social Herbalist on phone: Not on file    Gets together: Not on file    Attends religious service: Not on file    Active member of club or organization: Not on file    Attends meetings of clubs or organizations: Not on file    Relationship status: Not on file  . Intimate partner violence    Fear of current or ex partner: Not on file    Emotionally abused: Not on file    Physically abused: Not on file    Forced sexual activity: Not on file  Other Topics Concern  . Not on file  Social History Narrative  . Not on file     Review of Systems    General:  No chills, fever, night sweats or weight changes.  Cardiovascular:  No chest pain, dyspnea on exertion, edema, orthopnea, palpitations, paroxysmal nocturnal dyspnea. Dermatological: No rash, lesions/masses Respiratory: No cough, dyspnea Urologic: No hematuria, dysuria Abdominal:   No nausea, vomiting, diarrhea, bright red blood per rectum, melena, or hematemesis Neurologic:  No visual changes, wkns, changes in mental status. All other systems reviewed  and are otherwise negative except as noted above.  Physical Exam    VS:  BP (!) 136/58 (BP Location: Left Arm, Patient Position: Sitting, Cuff Size: Normal)   Pulse (!) 45   Ht 5\' 6"  (1.676 m)   Wt 157 lb 8 oz (71.4 kg)   BMI 25.42 kg/m  , BMI Body mass index is 25.42 kg/m. GEN: Well nourished, well developed, in no acute distress. HEENT: normal. Neck: Supple, no JVD, carotid bruits, or masses. Cardiac: Sinus bradycardia 45 bpm, no murmurs, rubs, or gallops. No clubbing, cyanosis, edema.  Radials/DP/PT 2+ and equal bilaterally.  Respiratory:  Respirations regular and unlabored, clear to auscultation bilaterally. GI: Soft, nontender, nondistended, BS + x 4. MS: no deformity or atrophy. Skin: warm and dry, no rash. Neuro:  Strength and sensation are intact. Psych: Normal affect.  Accessory Clinical Findings    ECG personally reviewed by me today-sinus bradycardia 45 bpm- No acute changes  EKG 06/05/2018: Sinus bradycardia 52 bpm EKG 05/18/2018: Sinus rhythm with abnormal ST and T wave abnormalities 80 bpm Echocardiogram 05/17/2018: Study Conclusions  - Left ventricle: The cavity size was normal. Systolic function was   normal. The estimated ejection fraction was in the range of 55%   to 60%. Wall motion was normal; there were no regional wall   motion abnormalities. Left ventricular diastolic function   parameters were normal. - Mitral valve: There was mild regurgitation. - Left atrium: The atrium was mildly dilated. - Right ventricle: Systolic function was normal. - Pulmonary arteries: Systolic pressure could not be accurately   estimated. Cardiac catheterization 05/16/2018:  Prox Cx to Mid Cx lesion is 80% stenosed.  Dist LM to Ost LAD lesion is 50% stenosed.  Mid LAD lesion is 80% stenosed.  Dist LAD lesion is 75% stenosed.  Ost 1st Diag to 1st Diag lesion is 75% stenosed.  Mid RCA lesion is 50% stenosed.  Mid RCA to Dist RCA lesion is 80% stenosed.  Post Atrio  lesion is 90% stenosed.  The left ventricular ejection fraction is 50-55% by visual estimate.  LV end diastolic pressure is mildly elevated.  Assessment & Plan   1.  Coronary artery disease- cardiac catheterization 05/16/2018, multivessel disease, medical management is the best option at this time.Patient feeling well today and has resumed all of his prior physical activities. Heartrate today 45 BPM. Continue aspirin 81 mg tablet daily Continue clopidogrel 75 mg tablet daily Continue isosorbide mononitrate 30 mg tablet daily Decrease metoprolol tartrate to 12.5 mg tablet twice daily-Bradycardia 45 bpm Start losartan 25 mg daily Maintain heart healthy low-sodium diet Increase physical activity as tolerated  2. Sinus Bradycardia- 45 BPM today. No block noted. Patient feeling well. Decrease metoprolol tartrate to 12.5 mg tablet twice daily Log blood pressure /HR and bring readings to next appointment  2.  Hyperlipidemia-05/16/2018: Cholesterol 223; HDL 27; LDL Cholesterol 166; Triglycerides 148; VLDL 30 Ordered lipid profile and CMP- to be [facility] mall in 1 week Continue atorvastatin 40 mg tablet daily  3.  Tobacco abuse-has maintained his no smoking status  4.  Essential hypertension- 136/58 today  metoprolol tartrate 12.5 mg tablet twice daily Start losartan 25 mg daily Maintain heart healthy low-sodium diet Increase physical activity as tolerated Log blood pressure and bring readings to next appointment  Disposition: Follow-up with Dr. Rockey Situ in 1 month  Deberah Pelton, NP 11/13/2018, 11:07 AM

## 2018-11-17 ENCOUNTER — Other Ambulatory Visit: Payer: Self-pay

## 2018-11-17 ENCOUNTER — Other Ambulatory Visit
Admission: RE | Admit: 2018-11-17 | Discharge: 2018-11-17 | Disposition: A | Discharge status: Home / Self Care | Payer: PPO | Attending: Nurse Practitioner | Admitting: Nurse Practitioner

## 2018-11-17 DIAGNOSIS — I214 Non-ST elevation (NSTEMI) myocardial infarction: Secondary | ICD-10-CM | POA: Insufficient documentation

## 2018-11-17 LAB — HEPATIC FUNCTION PANEL
ALT: 22 U/L (ref 0–44)
AST: 20 U/L (ref 15–41)
Albumin: 4.1 g/dL (ref 3.5–5.0)
Alkaline Phosphatase: 68 U/L (ref 38–126)
Bilirubin, Direct: <0.1 mg/dL (ref 0.0–0.2)
Total Bilirubin: 0.8 mg/dL (ref 0.3–1.2)
Total Protein: 6.9 g/dL (ref 6.5–8.1)

## 2018-11-17 LAB — LIPID PANEL
Cholesterol: 176 mg/dL (ref 0–200)
HDL: 28 mg/dL — ABNORMAL LOW (ref >40)
LDL Cholesterol: 81 mg/dL (ref 0–99)
Total CHOL/HDL Ratio: 6.3 RATIO
Triglycerides: 335 mg/dL — ABNORMAL HIGH (ref <150)
VLDL: 67 mg/dL — ABNORMAL HIGH (ref 0–40)

## 2018-11-17 NOTE — Addendum Note (Signed)
Addended by: Santiago Bur on: 11/17/2018 08:49 AM   Modules accepted: Orders

## 2018-11-17 NOTE — Addendum Note (Signed)
Addended by: Santiago Bur on: 11/17/2018 08:48 AM   Modules accepted: Orders

## 2018-11-18 ENCOUNTER — Telehealth: Payer: Self-pay | Admitting: Nurse Practitioner

## 2018-11-18 DIAGNOSIS — E785 Hyperlipidemia, unspecified: Secondary | ICD-10-CM

## 2018-11-18 DIAGNOSIS — Z79899 Other long term (current) drug therapy: Secondary | ICD-10-CM

## 2018-11-18 NOTE — Telephone Encounter (Signed)
Attempted to call the patient. No answer- I left a message for the patient to call back.

## 2018-11-18 NOTE — Telephone Encounter (Signed)
Notes recorded by Theora Gianotti, NP on 11/18/2018 at 12:26 PM EDT  LDL much improved but not at goal (<70). TG are quite high - much higher than 6 mos ago, leading me to think that maybe this isn't a fasting sample. LFTs wnl. Pls increase atorvastatin to 80mg  daily. F/u fasting lipids/lft's in 6 wks.

## 2018-11-19 NOTE — Telephone Encounter (Signed)
No answer. Left message to call back.   

## 2018-11-19 NOTE — Telephone Encounter (Signed)
Patient returning call for results 

## 2018-11-19 NOTE — Telephone Encounter (Signed)
Patient would like to be called at  [phone]

## 2018-11-20 MED ORDER — ATORVASTATIN CALCIUM 80 MG PO TABS: 80.0000 mg | ORAL_TABLET | Freq: Every day | ORAL | 2 refills | Status: DC

## 2018-11-20 NOTE — Telephone Encounter (Signed)
Results called to pt. Pt verbalized understanding. He and his wife verbalized understanding to increase atorvastatin to 80 mg daily and to get repeat lab work in 6 weeks around September 3. He is aware he will need to be fasting.  Rx sent to pharmacy. Orders placed.

## 2018-12-11 NOTE — Progress Notes (Signed)
Cardiology Office Note    Date:  12/16/2018   ID:  Donald Lowery, DOB [birth year], MRN [id number]  PCP:  [name], NP  Cardiologist:  [name], MD  Electrophysiologist:  None   Chief Complaint: Follow up  History of Present Illness:   Donald Lowery is a 68 y.o. male with history of CAD with non-STEMI in 04/2018 medically managed as below, hypertension, hyperlipidemia, and tobacco abuse quitting in 04/2018 who presents for follow-up of bradycardia.  Patient was admitted to the hospital in 04/2018 with a non-STEMI with troponin peaking at 7.4.  Echo showed normal LV systolic function.  He underwent diagnostic cath which revealed severe, diffuse, distal and small vessel disease without a clear culprit.  Medical management was advised.  He was seen in follow-up in 05/2018 and was doing well.  He indicated he had quit smoking.  He felt some of his medications were causing some daytime fatigue and indicated he had never taken medications previously so did not know what to expect.  He was most recently seen in the office on 11/13/2018 and was doing well without any recurrence of chest pain.  His activity level was slowly increasing.  He was noted to be bradycardic with a heart rate of 45 bpm leading to the decreasing of his Lopressor to 12.5 mg twice daily.  His BP was elevated in the 270J systolic.  He was started on losartan 25 mg daily.  Patient comes in accompanied by his wife today and is doing well.  He denies any chest pain, shortness of breath, palpitations, dizziness, presyncope, or syncope.  No lower extremity swelling, abdominal tension, orthopnea, PND, early satiety.  No falls, BRBPR, or melena.  He does continue to note some fatigue though feels like this is improving.  Of note, the patient did decrease his Lopressor to 12.5 mg though was taking this once daily rather than twice daily.  Patient supplied blood pressures and heart rates since he was last seen show systolics  ranging from 50-0 40 with diastolics ranging from 47 to 73 mmHg and heart rates ranging from 51 to 72 bpm.  He has been compliant with higher dose Lipitor, losartan, Imdur, Plavix, and aspirin.  Labs: 10/2018 - LFT normal, TC 176, TG 235, HDL 28, LDL 81 (improved from 166) 04/2018 - CBC unremarkable, A1c 5.8, potassium 4.0, serum creatinine 1.09   Past Medical History:  Diagnosis Date   Coronary artery disease    a. 04/2018 NSTEMI/Cath: LM nl, LAD 50p, 105m/d diffuse-small caliber, LCX heavily Ca2+ sev prox/mid dzs, RCA 90 diff distal dzs into RPL and RPDA. EF 50-55%-->Med Rx.   History of echocardiogram    a. 04/2018 Echo: EF 55-60%, no rwma, mild MR, mildly dil LA. Nl RV fxn.   Hyperlipidemia    NSTEMI (non-ST elevated myocardial infarction) ([place]) 05/15/2018   Tobacco abuse    a. Quit 04/2018.    Past Surgical History:  Procedure Laterality Date   BRAIN SURGERY     CARDIAC CATHETERIZATION     LEFT HEART CATH AND CORONARY ANGIOGRAPHY N/A 05/16/2018   Procedure: LEFT HEART CATH AND CORONARY ANGIOGRAPHY poss PCI;  Surgeon: [name], MD;  Location: [facility];  Service: Cardiovascular;  Laterality: N/A;    Current Medications: Current Meds  Medication Sig   aspirin EC 81 MG EC tablet Take 1 tablet (81 mg total) by mouth daily.   atorvastatin (LIPITOR) 80 MG tablet Take 1 tablet (80 mg total)  by mouth daily at 6 PM.   clopidogrel (PLAVIX) 75 MG tablet Take 1 tablet (75 mg total) by mouth daily.   isosorbide mononitrate (IMDUR) 30 MG 24 hr tablet Take 1 tablet (30 mg total) by mouth daily.   losartan (COZAAR) 25 MG tablet Take 1 tablet (25 mg total) by mouth daily.   metoprolol tartrate (LOPRESSOR) 25 MG tablet Take 0.5 tablets (12.5 mg total) by mouth daily.   nitroGLYCERIN (NITROSTAT) 0.4 MG SL tablet Place 1 tablet (0.4 mg total) under the tongue every 5 (five) minutes as needed for chest pain.    Allergies:   Patient has no known allergies.   Social  History   Socioeconomic History   Marital status: Married    Spouse name: Not on file   Number of children: Not on file   Years of education: Not on file   Highest education level: Not on file  Occupational History   Not on file  Social Needs   Financial resource strain: Not on file   Food insecurity    Worry: Not on file    Inability: Not on file   Transportation needs    Medical: Not on file    Non-medical: Not on file  Tobacco Use   Smoking status: Former Smoker    Packs/day: 1.00    Years: 45.00    Pack years: 45.00    Types: Cigarettes    Quit date: 05/15/2018    Years since quitting: 0.5   Smokeless tobacco: Never Used  Substance and Sexual Activity   Alcohol use: Never    Frequency: Never   Drug use: Never   Sexual activity: Not on file  Lifestyle   Physical activity    Days per week: Not on file    Minutes per session: Not on file   Stress: Not on file  Relationships   Social connections    Talks on phone: Not on file    Gets together: Not on file    Attends religious service: Not on file    Active member of club or organization: Not on file    Attends meetings of clubs or organizations: Not on file    Relationship status: Not on file  Other Topics Concern   Not on file  Social History Narrative   Not on file     Family History:  The patient's family history includes Diabetes in his brother; Heart Problems in his brother and mother; Heart attack in his father.  ROS:   Review of Systems  Constitutional: Positive for malaise/fatigue. Negative for chills, diaphoresis, fever and weight loss.  HENT: Negative for congestion.   Eyes: Negative for discharge and redness.  Respiratory: Negative for cough, hemoptysis, sputum production, shortness of breath and wheezing.   Cardiovascular: Negative for chest pain, palpitations, orthopnea, claudication, leg swelling and PND.  Gastrointestinal: Negative for abdominal pain, blood in stool,  heartburn, melena, nausea and vomiting.  Genitourinary: Negative for hematuria.  Musculoskeletal: Negative for falls and myalgias.  Skin: Negative for rash.  Neurological: Negative for dizziness, tingling, tremors, sensory change, speech change, focal weakness, loss of consciousness and weakness.  Endo/Heme/Allergies: Does not bruise/bleed easily.  Psychiatric/Behavioral: Negative for substance abuse. The patient is not nervous/anxious.   All other systems reviewed and are negative.    EKGs/Labs/Other Studies Reviewed:    Studies reviewed were summarized above. The additional studies were reviewed today:  2D Echo 04/2018: - Left ventricle: The cavity size was normal. Systolic function was  normal. The estimated ejection fraction was in the range of 55%   to 60%. Wall motion was normal; there were no regional wall   motion abnormalities. Left ventricular diastolic function   parameters were normal. - Mitral valve: There was mild regurgitation. - Left atrium: The atrium was mildly dilated. - Right ventricle: Systolic function was normal. - Pulmonary arteries: Systolic pressure could not be accurately   estimated. __________  LHC 04/2018: Coronary dominance: Right  Left mainstem:   Moderate size vessel that bifurcates into the LAD and left circumflex, no significant disease noted  Left anterior descending (LAD): moderate vessel that extends to the apical region, severely diffusely diseased diagonal branch 2 of small size, 50% proximal LAD disease, diffuse 80% disease mid vessel long region calcified disease, also mid to distal vessel is small in caliber, likely regions of additional severe disease  Left circumflex (LCx): Moderate-sized vessel with OM branch 2, OM's diffusely diseased, small , severe proximal to mid vessel disease heavily calcified  Right coronary artery (RCA):  Right dominant vessel with PL and PDA, severely diseased distal vessel extending into PL and PDA  branches, estimated at 90%  Left ventriculography: Left ventricular systolic function is low normal, LVEF is estimated at 50 to 55% %, there is no significant mitral regurgitation , no significant aortic valve stenosis  Final Conclusions:   Severe three-vessel disease, Vessels are small and diffusely calcified  Recommendations:  Medical management recommended given culprit vessel unclear Aspirin, Plavix, long-acting nitrates, high-dose statin, beta-blocker Low-dose ACE inhibitor if blood pressure tolerates If he continues to have unstable angina symptoms despite maximal medical therapy Would consider pharmacologic Myoview to identify region of ischemia, then with high risk stenting if possible   EKG:  EKG is ordered today.  The EKG ordered today demonstrates sinus bradycardia, 49 bpm, left anterior fascicular block, poor R wave progression along the precordial leads, no acute ST-T changes  Recent Labs: 05/15/2018: BUN 12; Creatinine, Ser 1.09; Potassium 4.0; Sodium 133 05/16/2018: Hemoglobin 13.8; Platelets 175 11/17/2018: ALT 22  Recent Lipid Panel    Component Value Date/Time   CHOL 176 11/17/2018 0854   TRIG 335 (H) 11/17/2018 0854   HDL 28 (L) 11/17/2018 0854   CHOLHDL 6.3 11/17/2018 0854   VLDL 67 (H) 11/17/2018 0854   LDLCALC 81 11/17/2018 0854    PHYSICAL EXAM:    VS:  BP (!) 144/80 (BP Location: Left Arm, Patient Position: Sitting, Cuff Size: Normal)    Pulse (!) 49    Ht 5\' 6"  (1.676 m)    Wt 160 lb (72.6 kg)    BMI 25.82 kg/m   BMI: Body mass index is 25.82 kg/m.  Physical Exam  Constitutional: He is oriented to person, place, and time. He appears well-developed and well-nourished.  HENT:  Head: Normocephalic and atraumatic.  Eyes: Right eye exhibits no discharge. Left eye exhibits no discharge.  Neck: Normal range of motion. No JVD present.  Cardiovascular: Regular rhythm, S1 normal, S2 normal and normal heart sounds. Bradycardia present. Exam reveals no distant  heart sounds, no friction rub, no midsystolic click and no opening snap.  No murmur heard. Pulses:      Posterior tibial pulses are 2+ on the right side and 2+ on the left side.  Pulmonary/Chest: Effort normal and breath sounds normal. No respiratory distress. He has no decreased breath sounds. He has no wheezes. He has no rales. He exhibits no tenderness.  Abdominal: Soft. He exhibits no distension. There is no abdominal  tenderness.  Musculoskeletal:        General: No edema.  Neurological: He is alert and oriented to person, place, and time.  Skin: Skin is warm and dry. No cyanosis. Nails show no clubbing.  Psychiatric: He has a normal mood and affect. His speech is normal and behavior is normal. Judgment and thought content normal.    Wt Readings from Last 3 Encounters:  12/16/18 160 lb (72.6 kg)  11/13/18 157 lb 8 oz (71.4 kg)  06/05/18 154 lb 12 oz (70.2 kg)     ASSESSMENT & PLAN:   1. CAD involving the native coronary arteries without angina: He is doing well without any symptoms concerning for angina.  He has been medically managed as outlined above.  Continue secondary prevention with aspirin, Plavix, Imdur, and Lipitor.  Discontinuing Lopressor as outlined below.  No plans for functional study at this time given the patient is doing well.  Moving forward, should he have recurrence of angina despite medication optimization would proceed with Lexiscan Myoview to evaluate for high risk ischemia followed by potential high risk PCI.  2. Bradycardia: Patient was noted to be bradycardic in 05/2018 with heart rate of 52 bpm with noted fatigue.  More recently, and follow-up in 10/2021 patient was noted to have a heart rate of 45 bpm leading to the decrease of his Lopressor to 12.5 mg twice daily.  Of note, patient did decrease his blood pressure to 12.5 mg though was taking this daily rather than twice daily.  Last dose of Lopressor was this morning.  Patient's heart rate remains bradycardic  today at 49 bpm.  Check TSH and BMP.  Stop Lopressor.  If heart rate remains bradycardic in follow-up in 2 weeks after washout of beta-blocker would recommend outpatient cardiac monitoring.  He was ambulated in the office and demonstrated appropriate chronotropic response with heart rate of 81 bpm.   3. Hyperlipidemia: LDL was noted to be improved at 81 during recent check in 10/2018 from a prior value of 166.  His Lipitor was increased to 80 mg daily at that time.  Of note, his triglycerides were elevated at greater than 300 and felt to be in the setting of nonfasting sample.  Recommend rechecking fasting lipid panel and liver function in 2 to 4 weeks with goal LDL being less than 70.  If LDL remains above goal at that time recommend addition of Zetia 10 mg daily.  4. Hypertension: Blood pressure is mildly elevated today at 144/80 though most readings provided from home indicate systolic readings in the 1 teens with rare readings in the 90s and occasional reading in the 140s.  We have discontinued Lopressor as above.  In this setting, we will likely need to titrate losartan further though given several BP readings on the soft side we will wait and see how his blood pressure trends off of Lopressor.  For now, continue losartan 25 mg daily.  5. Tobacco abuse: Patient quit at time of his non-STEMI in 04/2018.  Kudos.  Disposition: F/u with Dr. Rockey Situ or an APP in 2 weeks.   Medication Adjustments/Labs and Tests Ordered: Current medicines are reviewed at length with the patient today.  Concerns regarding medicines are outlined above. Medication changes, Labs and Tests ordered today are summarized above and listed in the Patient Instructions accessible in Encounters.   Signed, Christell Faith, PA-C 12/16/2018 9:06 AM     [address] [place], [place] [address] [phone]

## 2018-12-16 ENCOUNTER — Ambulatory Visit (INDEPENDENT_AMBULATORY_CARE_PROVIDER_SITE_OTHER): Payer: PPO | Admitting: Physician Assistant

## 2018-12-16 ENCOUNTER — Other Ambulatory Visit: Payer: Self-pay

## 2018-12-16 ENCOUNTER — Encounter: Payer: Self-pay | Admitting: Physician Assistant

## 2018-12-16 VITALS — BP 144/80 | HR 81 | Ht 66.0 in | Wt 160.0 lb

## 2018-12-16 DIAGNOSIS — I2510 Atherosclerotic heart disease of native coronary artery without angina pectoris: Secondary | ICD-10-CM

## 2018-12-16 DIAGNOSIS — R001 Bradycardia, unspecified: Secondary | ICD-10-CM

## 2018-12-16 DIAGNOSIS — Z87891 Personal history of nicotine dependence: Secondary | ICD-10-CM

## 2018-12-16 DIAGNOSIS — E785 Hyperlipidemia, unspecified: Secondary | ICD-10-CM

## 2018-12-16 DIAGNOSIS — I10 Essential (primary) hypertension: Secondary | ICD-10-CM | POA: Diagnosis not present

## 2018-12-16 NOTE — Patient Instructions (Signed)
Medication Instructions:  Your physician has recommended you make the following change in your medication:  1- STOP Lopressor  If you need a refill on your cardiac medications before your next appointment, please call your pharmacy.   Lab work: Your physician recommends that you have lab work today(TSH, BMET)  If you have labs (blood work) drawn today and your tests are completely normal, you will receive your results only by: [name] MyChart Message (if you have MyChart) OR . A paper copy in the mail If you have any lab test that is abnormal or we need to change your treatment, we will call you to review the results.  Testing/Procedures: None ordered   Follow-Up: At [facility], you and your health needs are our priority.  As part of our continuing mission to provide you with exceptional heart care, we have created designated Provider Care Teams.  These Care Teams include your primary Cardiologist (physician) and Advanced Practice Providers (APPs -  Physician Assistants and Nurse Practitioners) who all work together to provide you with the care you need, when you need it. You will need a follow up appointment in 2 weeks.  You may see [name], MD or Christell Faith, PA-C.

## 2018-12-17 ENCOUNTER — Telehealth: Payer: Self-pay

## 2018-12-17 LAB — TSH: TSH: 3.3 u[IU]/mL (ref 0.450–4.500)

## 2018-12-17 LAB — BASIC METABOLIC PANEL
BUN/Creatinine Ratio: 14 (ref 10–24)
BUN: 14 mg/dL (ref 8–27)
CO2: 25 mmol/L (ref 20–29)
Calcium: 9.4 mg/dL (ref 8.6–10.2)
Chloride: 99 mmol/L (ref 96–106)
Creatinine, Ser: 1.01 mg/dL (ref 0.76–1.27)
GFR calc Af Amer: 87 mL/min/{1.73_m2} (ref >59)
GFR calc non Af Amer: 76 mL/min/{1.73_m2} (ref >59)
Glucose: 97 mg/dL (ref 65–99)
Potassium: 4.6 mmol/L (ref 3.5–5.2)
Sodium: 138 mmol/L (ref 134–144)

## 2018-12-17 NOTE — Telephone Encounter (Signed)
-----   Message from Rise Mu, PA-C sent at 12/17/2018  8:31 AM EDT ----- Renal function normal. Potassium at goal.  Random glucose normal.  Thyroid function normal.   Reassuring labs.

## 2018-12-17 NOTE — Telephone Encounter (Signed)
Call to patient. Left detailed message of normal results. No new orders at this time. Encouraged pt to call back with any questions or concerns.

## 2019-01-01 ENCOUNTER — Telehealth: Payer: Self-pay

## 2019-01-01 ENCOUNTER — Other Ambulatory Visit
Admission: RE | Admit: 2019-01-01 | Discharge: 2019-01-01 | Disposition: A | Discharge status: Home / Self Care | Payer: PPO | Source: Ambulatory Visit | Attending: Nurse Practitioner | Admitting: Nurse Practitioner

## 2019-01-01 DIAGNOSIS — Z79899 Other long term (current) drug therapy: Secondary | ICD-10-CM | POA: Diagnosis not present

## 2019-01-01 DIAGNOSIS — E785 Hyperlipidemia, unspecified: Secondary | ICD-10-CM | POA: Diagnosis not present

## 2019-01-01 DIAGNOSIS — I2510 Atherosclerotic heart disease of native coronary artery without angina pectoris: Secondary | ICD-10-CM

## 2019-01-01 DIAGNOSIS — I10 Essential (primary) hypertension: Secondary | ICD-10-CM | POA: Insufficient documentation

## 2019-01-01 LAB — LIPID PANEL
Cholesterol: 151 mg/dL (ref 0–200)
HDL: 29 mg/dL — ABNORMAL LOW (ref >40)
LDL Cholesterol: 78 mg/dL (ref 0–99)
Total CHOL/HDL Ratio: 5.2 RATIO
Triglycerides: 220 mg/dL — ABNORMAL HIGH (ref <150)
VLDL: 44 mg/dL — ABNORMAL HIGH (ref 0–40)

## 2019-01-01 LAB — COMPREHENSIVE METABOLIC PANEL
ALT: 26 U/L (ref 0–44)
AST: 23 U/L (ref 15–41)
Albumin: 4 g/dL (ref 3.5–5.0)
Alkaline Phosphatase: 73 U/L (ref 38–126)
Anion gap: 8 (ref 5–15)
BUN: 13 mg/dL (ref 8–23)
CO2: 29 mmol/L (ref 22–32)
Calcium: 9.5 mg/dL (ref 8.9–10.3)
Chloride: 103 mmol/L (ref 98–111)
Creatinine, Ser: 1.11 mg/dL (ref 0.61–1.24)
GFR calc Af Amer: >60 mL/min (ref >60)
GFR calc non Af Amer: >60 mL/min (ref >60)
Glucose, Bld: 144 mg/dL — ABNORMAL HIGH (ref 70–99)
Potassium: 4.3 mmol/L (ref 3.5–5.1)
Sodium: 140 mmol/L (ref 135–145)
Total Bilirubin: 1.3 mg/dL — ABNORMAL HIGH (ref 0.3–1.2)
Total Protein: 7.1 g/dL (ref 6.5–8.1)

## 2019-01-01 NOTE — Telephone Encounter (Signed)
Patient returning cal

## 2019-01-01 NOTE — Telephone Encounter (Signed)
Attempted to call patient. LMTCB 01/01/2019

## 2019-01-01 NOTE — Telephone Encounter (Signed)
-----   Message from Nuala Alpha, LPN sent at 2/0/2334 10:38 AM EDT -----  ----- Message ----- From: Theora Gianotti, NP Sent: 01/01/2019   9:41 AM EDT To: [facility] Triage  LDL not quite @ goal.  Triglycerides remain elevated.  Was he fasting?  If yes, I would recommend Vascepa 2 mg twice daily with follow-up lipids and LFTs in about 8 weeks.  Liver function today looks okay.  Total bilirubin very mildly elevated likely nonspecific.  Glucose is elevated at 144-again was this fasting?

## 2019-01-02 MED ORDER — VASCEPA 1 G PO CAPS: 2.0000 | ORAL_CAPSULE | Freq: Two times a day (BID) | ORAL | 6 refills | Status: DC

## 2019-01-02 NOTE — Telephone Encounter (Signed)
Patient is calling back to speak with a nurse. Please call when available

## 2019-01-02 NOTE — Telephone Encounter (Signed)
Spoke with patients wife and reviewed results and recommendations. She states that he did not eat or drink anything after midnight prior to this testing and reviewed new medication Vascepa that they would like to start. Instructed her that he would take 2 pills twice a day and then have repeat labs in 8 weeks. She verbalized understanding of all instructions and read back information. Encouraged her to have him call back if he should have any further questions. She verbalized understanding with no further questions.

## 2019-01-05 NOTE — Progress Notes (Signed)
Cardiology Office Note    Date:  01/08/2019   ID:  Donald Lowery, DOB 25-Jan-1951, MRN [id number]  PCP:  [name], NP  Cardiologist:  [name], MD  Electrophysiologist:  None   Chief Complaint: Follow up  History of Present Illness:   Donald Lowery is a 68 y.o. male with history of CAD with non-STEMI in 04/2018 medically managed as below, hypertension, hyperlipidemia, and tobacco abuse quitting in 04/2018 who presents for follow-up of bradycardia.  Patient was admitted to the hospital in 04/2018 with a non-STEMI with troponin peaking at 7.4.  Echo showed normal LV systolic function.  He underwent diagnostic cath which revealed severe, diffuse, distal and small vessel disease without a clear culprit.  Medical management was advised.  He was seen in follow-up in 05/2018 and was doing well.  He indicated he had quit smoking.  He felt some of his medications were causing some daytime fatigue and indicated he had never taken medications previously so did not know what to expect.  He was seen in the office on 11/13/2018 and was doing well without any recurrence of chest pain.  His activity level was slowly increasing.  He was noted to be bradycardic with a heart rate of 45 bpm leading to the decreasing of his Lopressor to 12.5 mg twice daily.  His BP was elevated in the 170Y systolic.  He was started on losartan 25 mg daily.  He was seen in follow up on 12/16/2018 and was doing well.  He felt like his fatigue was improving.  It was noted he had been taking Lopressor 12.5 mg once daily rather than bid.  Home blood pressures and heart rates showed systolic readings ranging from 17-494 with diastolics ranging from 47 to 73 mmHg and heart rates ranging from 51 to 72 bpm.  Heart rate in the office of 49 bpm following Lopressor that morning.  In this setting, his Lopressor was discontinued.  He demonstrated appropriate chronotropic response in the office with a heart rate of 81 bpm.    Patient  comes in doing well from a cardiac perspective.  He denies any chest pain, shortness of breath, palpitations, dizziness, presyncope, syncope.  No lower extremity swelling, abdominal distention, orthopnea, PND, or early satiety.  No falls, BRBPR, or melena.  Since quitting tobacco he has been eating a lot more candy.  His wife feels like this is contributing to some of his abdominal bloating and weight gain.  Since his visit with [place] in 05/2018 his weight is up 4 pounds.  Blood pressure at home has been reasonably controlled ranging mostly from the 1 teens to 496P systolic with a reading of 591 systolic.  He also notes some center visual field blurriness that has been longstanding and has not followed up with ophthalmology for this.  He does have concerns with affordability of Vascepa long-term.   Labs: 12/2018 - potassium 4.3, SCr 1.11, AST/ALT normal, albumin 4.0, TC 151, TG 220, HDL 29, LDL 78 11/2018 - TSH normal 10/2018 - LFT normal, TC 176, TG 235, HDL 28, LDL 81 (improved from 166) 04/2018 - CBC unremarkable, A1c 5.8  Past Medical History:  Diagnosis Date  . Coronary artery disease    a. 04/2018 NSTEMI/Cath: LM nl, LAD 50p, 68m/d diffuse-small caliber, LCX heavily Ca2+ sev prox/mid dzs, RCA 90 diff distal dzs into RPL and RPDA. EF 50-55%-->Med Rx.  [name] History of echocardiogram    a. 04/2018 Echo: EF 55-60%, no rwma, mild  MR, mildly dil LA. Nl RV fxn.  . Hyperlipidemia   . NSTEMI (non-ST elevated myocardial infarction) ([place]) 05/15/2018  . Tobacco abuse    a. Quit 04/2018.    Past Surgical History:  Procedure Laterality Date  . BRAIN SURGERY    . CARDIAC CATHETERIZATION    . LEFT HEART CATH AND CORONARY ANGIOGRAPHY N/A 05/16/2018   Procedure: LEFT HEART CATH AND CORONARY ANGIOGRAPHY poss PCI;  Surgeon: [name], MD;  Location: [facility];  Service: Cardiovascular;  Laterality: N/A;    Current Medications: Current Meds  Medication Sig  . aspirin EC 81 MG EC tablet Take 1  tablet (81 mg total) by mouth daily.  [name] atorvastatin (LIPITOR) 80 MG tablet Take 1 tablet (80 mg total) by mouth daily at 6 PM.  . clopidogrel (PLAVIX) 75 MG tablet Take 1 tablet (75 mg total) by mouth daily.  Vanessa Kick Ethyl (VASCEPA) 1 g CAPS Take 2 capsules (2 g total) by mouth 2 (two) times daily.  . isosorbide mononitrate (IMDUR) 30 MG 24 hr tablet Take 1 tablet (30 mg total) by mouth daily.  [name] losartan (COZAAR) 25 MG tablet Take 1 tablet (25 mg total) by mouth daily.  . nitroGLYCERIN (NITROSTAT) 0.4 MG SL tablet Place 1 tablet (0.4 mg total) under the tongue every 5 (five) minutes as needed for chest pain.    Allergies:   Patient has no known allergies.   Social History   Socioeconomic History  . Marital status: Married    Spouse name: Not on file  . Number of children: Not on file  . Years of education: Not on file  . Highest education level: Not on file  Occupational History  . Not on file  Social Needs  . Financial resource strain: Not on file  . Food insecurity    Worry: Not on file    Inability: Not on file  . Transportation needs    Medical: Not on file    Non-medical: Not on file  Tobacco Use  . Smoking status: Former Smoker    Packs/day: 1.00    Years: 45.00    Pack years: 45.00    Types: Cigarettes    Quit date: 05/15/2018    Years since quitting: 0.6  . Smokeless tobacco: Never Used  Substance and Sexual Activity  . Alcohol use: Never    Frequency: Never  . Drug use: Never  . Sexual activity: Not on file  Lifestyle  . Physical activity    Days per week: Not on file    Minutes per session: Not on file  . Stress: Not on file  Relationships  . Social Herbalist on phone: Not on file    Gets together: Not on file    Attends religious service: Not on file    Active member of club or organization: Not on file    Attends meetings of clubs or organizations: Not on file    Relationship status: Not on file  Other Topics Concern  . Not on file   Social History Narrative  . Not on file     Family History:  The patient's family history includes Diabetes in his brother; Heart Problems in his brother and mother; Heart attack in his father.  ROS:   Review of Systems  Constitutional: Negative for chills, diaphoresis, fever, malaise/fatigue and weight loss.  HENT: Negative for congestion.   Eyes: Positive for blurred vision. Negative for discharge and redness.  Respiratory:  Negative for cough, hemoptysis, sputum production, shortness of breath and wheezing.   Cardiovascular: Negative for chest pain, palpitations, orthopnea, claudication, leg swelling and PND.  Gastrointestinal: Negative for abdominal pain, blood in stool, heartburn, melena, nausea and vomiting.  Genitourinary: Negative for hematuria.  Musculoskeletal: Negative for falls and myalgias.  Skin: Negative for rash.  Neurological: Negative for dizziness, tingling, tremors, sensory change, speech change, focal weakness, loss of consciousness and weakness.  Endo/Heme/Allergies: Does not bruise/bleed easily.  Psychiatric/Behavioral: Negative for substance abuse. The patient is not nervous/anxious.   All other systems reviewed and are negative.    EKGs/Labs/Other Studies Reviewed:    Studies reviewed were summarized above. The additional studies were reviewed today:  2D Echo 04/2018: - Left ventricle: The cavity size was normal. Systolic function was normal. The estimated ejection fraction was in the range of 55% to 60%. Wall motion was normal; there were no regional wall motion abnormalities. Left ventricular diastolic function parameters were normal. - Mitral valve: There was mild regurgitation. - Left atrium: The atrium was mildly dilated. - Right ventricle: Systolic function was normal. - Pulmonary arteries: Systolic pressure could not be accurately estimated. __________  LHC 04/2018: Coronary dominance: Right  Left mainstem: Moderate size vessel  that bifurcates into the LAD and left circumflex, no significant disease noted  Left anterior descending (LAD): moderate vessel that extends to the apical region, severely diffusely diseased diagonal branch 2 of small size, 50% proximal LAD disease, diffuse 80% disease mid vessel long region calcified disease, also mid to distal vessel is small in caliber, likely regions of additional severe disease  Left circumflex (LCx): Moderate-sized vessel with OM branch 2, OM's diffusely diseased, small , severe proximal to mid vessel disease heavily calcified  Right coronary artery (RCA): Right dominant vessel with PL and PDA, severely diseased distal vessel extending into PL and PDA branches, estimated at 90%  Left ventriculography: Left ventricular systolic function is low normal, LVEF is estimated at 50 to 55% %, there is no significant mitral regurgitation , no significant aortic valve stenosis  Final Conclusions:  Severe three-vessel disease, Vessels are small and diffusely calcified  Recommendations:  Medical management recommended given culprit vessel unclear Aspirin, Plavix, long-acting nitrates, high-dose statin, beta-blocker Low-dose ACE inhibitor if blood pressure tolerates If he continues to have unstable angina symptoms despite maximal medical therapy Would consider pharmacologic Myoview to identify region of ischemia, then with high risk stenting if possible   EKG:  EKG is ordered today.  The EKG ordered today demonstrates NSR, 69 bpm, rare PVC, baseline wandering, poor R wave progression along the precordial leads, no acute ST-T changes  Recent Labs: 05/16/2018: Hemoglobin 13.8; Platelets 175 12/16/2018: TSH 3.300 01/01/2019: ALT 26; BUN 13; Creatinine, Ser 1.11; Potassium 4.3; Sodium 140  Recent Lipid Panel    Component Value Date/Time   CHOL 151 01/01/2019 0854   TRIG 220 (H) 01/01/2019 0854   HDL 29 (L) 01/01/2019 0854   CHOLHDL 5.2 01/01/2019 0854   VLDL 44 (H) 01/01/2019  0854   LDLCALC 78 01/01/2019 0854    PHYSICAL EXAM:    VS:  BP (!) 152/82 (BP Location: Left Arm, Patient Position: Sitting, Cuff Size: Normal)   Pulse 69   Ht 5\' 7"  (1.702 m)   Wt 158 lb (71.7 kg)   BMI 24.75 kg/m   BMI: Body mass index is 24.75 kg/m.  Physical Exam  Constitutional: He is oriented to person, place, and time. He appears well-developed and well-nourished.  HENT:  Head:  Normocephalic and atraumatic.  Eyes: Right eye exhibits no discharge. Left eye exhibits no discharge.  Neck: Normal range of motion. No JVD present.  Cardiovascular: Normal rate, regular rhythm, S1 normal, S2 normal and normal heart sounds. Exam reveals no distant heart sounds, no friction rub, no midsystolic click and no opening snap.  No murmur heard. Pulses:      Posterior tibial pulses are 2+ on the right side and 2+ on the left side.  Pulmonary/Chest: Effort normal and breath sounds normal. No respiratory distress. He has no decreased breath sounds. He has no wheezes. He has no rales. He exhibits no tenderness.  Abdominal: Soft. He exhibits no distension. There is no abdominal tenderness.  Musculoskeletal:        General: No edema.  Neurological: He is alert and oriented to person, place, and time.  Skin: Skin is warm and dry. No cyanosis. Nails show no clubbing.  Psychiatric: He has a normal mood and affect. His speech is normal and behavior is normal. Judgment and thought content normal.    Wt Readings from Last 3 Encounters:  01/08/19 158 lb (71.7 kg)  12/16/18 160 lb (72.6 kg)  11/13/18 157 lb 8 oz (71.4 kg)     ASSESSMENT & PLAN:   1. CAD involving the native coronary arteries without angina: He is doing well without any symptoms concerning for angina.  He has been medically managed as outlined above.  Due to bradycardia as outlined below we have had to discontinue beta-blocker therapy.  In the setting of the patient denying any anginal symptoms we will continue to for further ischemic  evaluation at this time.  Continue aspirin, Lipitor, Plavix, Cipro, and Imdur.  Aggressive resector modification recommended.  2. Sinus bradycardia: Following discontinuation of metoprolol, his heart rate has improved and is now normal.  Continue to avoid AV nodal blocking agents.  Recent potassium and TSH normal.  3. Hyperlipidemia/hypertriglyceridemia: Most recent LDL from 01/01/2019 slightly improved though remained above goal at 78 with triglyceride of 220.  After discussing with patient's wife it was found he was in fact fasting for that sample.  In this setting, he was started on Vascepa and continued on Lipitor 80 mg daily.  In follow-up, if his LDL remains above goal would recommend addition of Zetia 10 mg daily.  Patient does have concerns regarding the affordability of Vascepa long-term.  I have advised him this can be re-addressed after his follow-up lipid panel is reviewed this fall.  I also instructed him to decrease the candy/sugary foods which would help significantly.  4. Hypertension: Initial blood pressure noted to be elevated as outlined above with repeat BP taken during triage by CMA noted to be improved at 124/70.  Home BP readings typically range in the 1 teens to 120s with a rare reading of 188 systolic.  Continue losartan and Imdur.  5. Tobacco abuse: Patient quit following his non-STEMI in 04/2018.  6. Hyperglycemia: Fasting glucose level earlier this month noted to be 144.  Recommend he follow-up with PCP.  A1c from 04/2018 of 5.8.  7. Visual field changes: Symptoms seem to be consistent with macular degeneration.  I have recommended he contact his ophthalmologist for follow-up.  Disposition: F/u with Dr. Rockey Situ or an APP in 6 months.   Medication Adjustments/Labs and Tests Ordered: Current medicines are reviewed at length with the patient today.  Concerns regarding medicines are outlined above. Medication changes, Labs and Tests ordered today are summarized above and listed  in the  Patient Instructions accessible in Encounters.   Signed, Christell Faith, PA-C 01/08/2019 1:44 PM     [address] [place], [place] [address] [phone]

## 2019-01-08 ENCOUNTER — Encounter: Payer: Self-pay | Admitting: Physician Assistant

## 2019-01-08 ENCOUNTER — Ambulatory Visit (INDEPENDENT_AMBULATORY_CARE_PROVIDER_SITE_OTHER): Payer: PPO | Admitting: Physician Assistant

## 2019-01-08 ENCOUNTER — Other Ambulatory Visit: Payer: Self-pay

## 2019-01-08 VITALS — BP 152/82 | HR 69 | Ht 67.0 in | Wt 158.0 lb

## 2019-01-08 DIAGNOSIS — H5340 Unspecified visual field defects: Secondary | ICD-10-CM | POA: Diagnosis not present

## 2019-01-08 DIAGNOSIS — Z720 Tobacco use: Secondary | ICD-10-CM | POA: Diagnosis not present

## 2019-01-08 DIAGNOSIS — E781 Pure hyperglyceridemia: Secondary | ICD-10-CM

## 2019-01-08 DIAGNOSIS — I10 Essential (primary) hypertension: Secondary | ICD-10-CM | POA: Diagnosis not present

## 2019-01-08 DIAGNOSIS — R001 Bradycardia, unspecified: Secondary | ICD-10-CM

## 2019-01-08 DIAGNOSIS — I2510 Atherosclerotic heart disease of native coronary artery without angina pectoris: Secondary | ICD-10-CM

## 2019-01-08 DIAGNOSIS — E785 Hyperlipidemia, unspecified: Secondary | ICD-10-CM | POA: Diagnosis not present

## 2019-01-08 NOTE — Patient Instructions (Signed)
Medication Instructions:  Your physician recommends that you continue on your current medications as directed. Please refer to the Current Medication list given to you today.  If you need a refill on your cardiac medications before your next appointment, please call your pharmacy.   Lab work: None ordered If you have labs (blood work) drawn today and your tests are completely normal, you will receive your results only by: [name] MyChart Message (if you have MyChart) OR . A paper copy in the mail If you have any lab test that is abnormal or we need to change your treatment, we will call you to review the results.  Testing/Procedures: None ordered   Follow-Up: At [facility], you and your health needs are our priority.  As part of our continuing mission to provide you with exceptional heart care, we have created designated Provider Care Teams.  These Care Teams include your primary Cardiologist (physician) and Advanced Practice Providers (APPs -  Physician Assistants and Nurse Practitioners) who all work together to provide you with the care you need, when you need it. You will need a follow up appointment in 6 months.  Please call our office 2 months in advance to schedule this appointment.  You may see [name], MD or Christell Faith, PA-C.

## 2019-01-30 DIAGNOSIS — Z1159 Encounter for screening for other viral diseases: Secondary | ICD-10-CM | POA: Diagnosis not present

## 2019-01-30 DIAGNOSIS — Z125 Encounter for screening for malignant neoplasm of prostate: Secondary | ICD-10-CM | POA: Diagnosis not present

## 2019-01-30 DIAGNOSIS — Z1331 Encounter for screening for depression: Secondary | ICD-10-CM | POA: Diagnosis not present

## 2019-01-30 DIAGNOSIS — R918 Other nonspecific abnormal finding of lung field: Secondary | ICD-10-CM | POA: Diagnosis not present

## 2019-01-30 DIAGNOSIS — R9389 Abnormal findings on diagnostic imaging of other specified body structures: Secondary | ICD-10-CM | POA: Diagnosis not present

## 2019-01-30 DIAGNOSIS — E782 Mixed hyperlipidemia: Secondary | ICD-10-CM | POA: Diagnosis not present

## 2019-01-30 DIAGNOSIS — Z136 Encounter for screening for cardiovascular disorders: Secondary | ICD-10-CM | POA: Diagnosis not present

## 2019-01-30 DIAGNOSIS — Z0000 Encounter for general adult medical examination without abnormal findings: Secondary | ICD-10-CM | POA: Diagnosis not present

## 2019-01-30 DIAGNOSIS — Z87891 Personal history of nicotine dependence: Secondary | ICD-10-CM | POA: Diagnosis not present

## 2019-01-30 DIAGNOSIS — I2510 Atherosclerotic heart disease of native coronary artery without angina pectoris: Secondary | ICD-10-CM | POA: Diagnosis not present

## 2019-01-30 DIAGNOSIS — R7302 Impaired glucose tolerance (oral): Secondary | ICD-10-CM | POA: Diagnosis not present

## 2019-01-30 DIAGNOSIS — I10 Essential (primary) hypertension: Secondary | ICD-10-CM | POA: Diagnosis not present

## 2019-02-04 ENCOUNTER — Other Ambulatory Visit (HOSPITAL_COMMUNITY): Payer: Self-pay | Admitting: Nurse Practitioner

## 2019-02-04 ENCOUNTER — Other Ambulatory Visit: Payer: Self-pay | Admitting: Nurse Practitioner

## 2019-02-04 DIAGNOSIS — R9389 Abnormal findings on diagnostic imaging of other specified body structures: Secondary | ICD-10-CM

## 2019-02-04 DIAGNOSIS — Z87891 Personal history of nicotine dependence: Secondary | ICD-10-CM

## 2019-02-10 ENCOUNTER — Ambulatory Visit
Admission: RE | Admit: 2019-02-10 | Discharge: 2019-02-10 | Disposition: A | Discharge status: Home / Self Care | Payer: PPO | Source: Ambulatory Visit | Attending: Nurse Practitioner | Admitting: Nurse Practitioner

## 2019-02-10 ENCOUNTER — Other Ambulatory Visit: Payer: Self-pay

## 2019-02-10 ENCOUNTER — Encounter (INDEPENDENT_AMBULATORY_CARE_PROVIDER_SITE_OTHER): Payer: Self-pay

## 2019-02-10 DIAGNOSIS — R9389 Abnormal findings on diagnostic imaging of other specified body structures: Secondary | ICD-10-CM | POA: Diagnosis not present

## 2019-02-10 DIAGNOSIS — Z87891 Personal history of nicotine dependence: Secondary | ICD-10-CM | POA: Diagnosis not present

## 2019-02-10 DIAGNOSIS — I700 Atherosclerosis of aorta: Secondary | ICD-10-CM | POA: Insufficient documentation

## 2019-02-10 DIAGNOSIS — J984 Other disorders of lung: Secondary | ICD-10-CM | POA: Diagnosis not present

## 2019-02-10 DIAGNOSIS — J439 Emphysema, unspecified: Secondary | ICD-10-CM | POA: Diagnosis not present

## 2019-02-10 MED ORDER — IOHEXOL 300 MG/ML  SOLN
75.0000 mL | Freq: Once | INTRAMUSCULAR | Status: AC | PRN
  Administered 2019-02-10: 09:00:00 75 mL via INTRAVENOUS

## 2019-02-19 DIAGNOSIS — I10 Essential (primary) hypertension: Secondary | ICD-10-CM | POA: Diagnosis not present

## 2019-02-19 DIAGNOSIS — Z136 Encounter for screening for cardiovascular disorders: Secondary | ICD-10-CM | POA: Diagnosis not present

## 2019-02-19 DIAGNOSIS — E782 Mixed hyperlipidemia: Secondary | ICD-10-CM | POA: Diagnosis not present

## 2019-02-19 DIAGNOSIS — Z87891 Personal history of nicotine dependence: Secondary | ICD-10-CM | POA: Diagnosis not present

## 2019-02-19 DIAGNOSIS — I2510 Atherosclerotic heart disease of native coronary artery without angina pectoris: Secondary | ICD-10-CM | POA: Diagnosis not present

## 2019-06-15 ENCOUNTER — Other Ambulatory Visit: Payer: Self-pay

## 2019-06-15 MED ORDER — ISOSORBIDE MONONITRATE ER 30 MG PO TB24: 30.0000 mg | ORAL_TABLET | Freq: Every day | ORAL | 0 refills | Status: DC

## 2019-06-15 MED ORDER — CLOPIDOGREL BISULFATE 75 MG PO TABS: 75.0000 mg | ORAL_TABLET | Freq: Every day | ORAL | 0 refills | Status: DC

## 2019-07-06 NOTE — Progress Notes (Signed)
Cardiology Office Note    Date:  07/10/2019   ID:  Donald Lowery, DOB 11/03/1950, MRN [id number]  PCP:  [name], NP  Cardiologist:  [name], MD  Electrophysiologist:  None   Chief Complaint: Follow-up  History of Present Illness:   Donald Lowery is a 69 y.o. male with history of CAD with non-STEMI in 04/2018 medically managed as below, hypertension, hyperlipidemia, and tobacco abuse quitting in 04/2018 who presents for follow-upof CAD.                                                                                                                        He was admitted to the hospital in 04/2018 with a non-STEMI with troponin peaking at 7.4. Echo showed normal LV systolic function. He underwent diagnostic cath which revealed severe, diffuse, distal and small vessel disease without a clear culprit. Medical management was advised. He was seen in follow-up in 05/2018 and was doing well. He indicated he had quit smoking. He feltsome of his medications were causing some daytime fatigue and indicated he had never taken medications previously so did not know what to expect. He was seen in the office on 11/13/2018 and was doing well without any recurrence of chest pain. His activity level was slowly increasing. He was noted to be bradycardic with a heart rate of 45 bpm leading to the decreasing of his Lopressor to 12.5 mg twice daily. His BP was elevated in the 093A systolic. He was started on losartan 25 mg daily.  He was seen in follow up on 12/16/2018 and was doing well.  He felt like his fatigue was improving.  It was noted he had been taking Lopressor 12.5 mg once daily rather than bid.  Home blood pressures and heart rates showed systolic readings ranging from 35-573 with diastolics ranging from 47 to 73 mmHg and heart rates ranging from 51 to 72 bpm. Heart rate in the office of 49 bpm following Lopressor that morning.  In this setting, his Lopressor was discontinued.  He  demonstrated appropriate chronotropic response in the office with a heart rate of 81 bpm.    He was last seen in the office in 12/2018 and was doing well from a cardiac perspective.  He did note since he quit smoking he was eating a significant amount of candy.  His weight was up 4 pounds.  BP was in the 1 teens to 220U systolic.  Heart rate 69 bpm off beta-blocker.  No medication changes were made at that time.  He comes in doing well from a cardiac perspective.  No chest pain, shortness of breath, palpitations, dizziness, presyncope, syncope, lower extremity swelling, orthopnea, PND, or early satiety.  No falls, hematochezia, or melena.  He does feel like his abdomen is bloated and has been passing a lot of flatus over the past several months.  He does wonder if this is one of his medications.  Weight has been stable.  He  remains very active at baseline spending most of his time outdoors working on his property without stopping for breaks.  With this, he denies any cardiac limitation.  He does note at the end of each day he is quite tired though is very busy.  Otherwise, he does not have any issues or concerns.   Labs independently reviewed: 01/2019 - A1c 6.3 12/2018 - potassium 4.3, SCr 1.11, AST/ALT normal, albumin 4.0, TC 151, TG 220, HDL 29, LDL 78 11/2018 - TSH normal 10/2018-LFT normal, TC 176, TG 235, HDL 28, LDL 81 (improved from 166) 04/2018-CBC unremarkable  Past Medical History:  Diagnosis Date  . Coronary artery disease    a. 04/2018 NSTEMI/Cath: LM nl, LAD 50p, 54m/d diffuse-small caliber, LCX heavily Ca2+ sev prox/mid dzs, RCA 90 diff distal dzs into RPL and RPDA. EF 50-55%-->Med Rx.  [name] History of echocardiogram    a. 04/2018 Echo: EF 55-60%, no rwma, mild MR, mildly dil LA. Nl RV fxn.  . Hyperlipidemia   . NSTEMI (non-ST elevated myocardial infarction) ([place]) 05/15/2018  . Tobacco abuse    a. Quit 04/2018.    Past Surgical History:  Procedure Laterality Date  . BRAIN SURGERY     . CARDIAC CATHETERIZATION    . LEFT HEART CATH AND CORONARY ANGIOGRAPHY N/A 05/16/2018   Procedure: LEFT HEART CATH AND CORONARY ANGIOGRAPHY poss PCI;  Surgeon: [name], MD;  Location: [facility];  Service: Cardiovascular;  Laterality: N/A;    Current Medications: Current Meds  Medication Sig  . aspirin EC 81 MG EC tablet Take 1 tablet (81 mg total) by mouth daily.  [name] atorvastatin (LIPITOR) 80 MG tablet Take 1 tablet (80 mg total) by mouth daily at 6 PM.  . clopidogrel (PLAVIX) 75 MG tablet Take 1 tablet (75 mg total) by mouth daily.  Vanessa Kick Ethyl (VASCEPA) 1 g CAPS Take 2 capsules (2 g total) by mouth 2 (two) times daily.  . isosorbide mononitrate (IMDUR) 30 MG 24 hr tablet Take 1 tablet (30 mg total) by mouth daily. Patient needs an appointment for further refills, Thanks ! [phone]  . losartan (COZAAR) 25 MG tablet Take 1 tablet (25 mg total) by mouth daily.  . nitroGLYCERIN (NITROSTAT) 0.4 MG SL tablet Place 1 tablet (0.4 mg total) under the tongue every 5 (five) minutes as needed for chest pain.    Allergies:   Patient has no known allergies.   Social History   Socioeconomic History  . Marital status: Married    Spouse name: Not on file  . Number of children: Not on file  . Years of education: Not on file  . Highest education level: Not on file  Occupational History  . Not on file  Tobacco Use  . Smoking status: Former Smoker    Packs/day: 1.00    Years: 45.00    Pack years: 45.00    Types: Cigarettes    Quit date: 05/15/2018    Years since quitting: 1.1  . Smokeless tobacco: Never Used  Substance and Sexual Activity  . Alcohol use: Never  . Drug use: Never  . Sexual activity: Not on file  Other Topics Concern  . Not on file  Social History Narrative  . Not on file   Social Determinants of Health   Financial Resource Strain:   . Difficulty of Paying Living Expenses:   Food Insecurity:   . Worried About Charity fundraiser in the  Last Year:   . Ran  Out of Food in the Last Year:   Transportation Needs:   . Lack of Transportation (Medical):   [name] Lack of Transportation (Non-Medical):   Physical Activity:   . Days of Exercise per Week:   . Minutes of Exercise per Session:   Stress:   . Feeling of Stress :   Social Connections:   . Frequency of Communication with Friends and Family:   . Frequency of Social Gatherings with Friends and Family:   . Attends Religious Services:   . Active Member of Clubs or Organizations:   . Attends Archivist Meetings:   [name] Marital Status:      Family History:  The patient's family history includes Diabetes in his brother; Heart Problems in his brother and mother; Heart attack in his father.  ROS:   Review of Systems  Constitutional: Negative for chills, diaphoresis, fever, malaise/fatigue and weight loss.  HENT: Negative for congestion.   Eyes: Negative for discharge and redness.  Respiratory: Negative for cough, sputum production, shortness of breath and wheezing.   Cardiovascular: Negative for chest pain, palpitations, orthopnea, claudication, leg swelling and PND.  Gastrointestinal: Negative for abdominal pain, blood in stool, heartburn, melena, nausea and vomiting.       Increased flatus  Genitourinary: Negative for hematuria.  Musculoskeletal: Negative for falls and myalgias.  Skin: Negative for rash.  Neurological: Negative for dizziness, tingling, tremors, sensory change, speech change, focal weakness, loss of consciousness and weakness.  Endo/Heme/Allergies: Does not bruise/bleed easily.  Psychiatric/Behavioral: Negative for substance abuse. The patient is not nervous/anxious.   All other systems reviewed and are negative.    EKGs/Labs/Other Studies Reviewed:    Studies reviewed were summarized above. The additional studies were reviewed today:  2D Echo 04/2018: - Left ventricle: The cavity size was normal. Systolic function was normal. The estimated  ejection fraction was in the range of 55% to 60%. Wall motion was normal; there were no regional wall motion abnormalities. Left ventricular diastolic function parameters were normal. - Mitral valve: There was mild regurgitation. - Left atrium: The atrium was mildly dilated. - Right ventricle: Systolic function was normal. - Pulmonary arteries: Systolic pressure could not be accurately estimated. __________  LHC 04/2018: Coronary dominance: Right  Left mainstem: Moderate size vessel that bifurcates into the LAD and left circumflex, no significant disease noted  Left anterior descending (LAD): moderate vessel that extends to the apical region, severely diffusely diseased diagonal branch 2 of small size, 50% proximal LAD disease, diffuse 80% disease mid vessel long region calcified disease, also mid to distal vessel is small in caliber, likely regions of additional severe disease  Left circumflex (LCx): Moderate-sized vessel with OM branch 2, OM's diffusely diseased, small , severe proximal to mid vessel disease heavily calcified  Right coronary artery (RCA): Right dominant vessel with PL and PDA, severely diseased distal vessel extending into PL and PDA branches, estimated at 90%  Left ventriculography: Left ventricular systolic function is low normal, LVEF is estimated at 50 to 55% %, there is no significant mitral regurgitation , no significant aortic valve stenosis  Final Conclusions:  Severe three-vessel disease, Vessels are small and diffusely calcified  Recommendations:  Medical management recommended given culprit vessel unclear Aspirin, Plavix, long-acting nitrates, high-dose statin, beta-blocker Low-dose ACE inhibitor if blood pressure tolerates If he continues to have unstable angina symptoms despite maximal medical therapy Would consider pharmacologic Myoview to identify region of ischemia, then with high risk stenting if possible   EKG:  EKG is  ordered  today.  The EKG ordered today demonstrates NSR, 67 bpm, nonspecific inferior ST-T changes.  Compared to prior study, access has changed and inferior changes are new  Recent Labs: 12/16/2018: TSH 3.300 01/01/2019: ALT 26; BUN 13; Creatinine, Ser 1.11; Potassium 4.3; Sodium 140  Recent Lipid Panel    Component Value Date/Time   CHOL 151 01/01/2019 0854   TRIG 220 (H) 01/01/2019 0854   HDL 29 (L) 01/01/2019 0854   CHOLHDL 5.2 01/01/2019 0854   VLDL 44 (H) 01/01/2019 0854   LDLCALC 78 01/01/2019 0854    PHYSICAL EXAM:    VS:  BP 114/66 (BP Location: Left Arm, Patient Position: Sitting, Cuff Size: Normal)   Pulse 67   Ht 5\' 6"  (1.676 m)   Wt 158 lb 6 oz (71.8 kg)   SpO2 97%   BMI 25.56 kg/m   BMI: Body mass index is 25.56 kg/m.  Physical Exam  Constitutional: He is oriented to person, place, and time. He appears well-developed and well-nourished.  HENT:  Head: Normocephalic and atraumatic.  Eyes: Right eye exhibits no discharge. Left eye exhibits no discharge.  Neck: No JVD present.  Cardiovascular: Normal rate, regular rhythm, S1 normal, S2 normal and normal heart sounds. Exam reveals no distant heart sounds, no friction rub, no midsystolic click and no opening snap.  No murmur heard. Pulses:      Posterior tibial pulses are 2+ on the right side and 2+ on the left side.  Pulmonary/Chest: Effort normal and breath sounds normal. No respiratory distress. He has no decreased breath sounds. He has no wheezes. He has no rales. He exhibits no tenderness.  Abdominal: Soft. He exhibits no distension. There is no abdominal tenderness.  Musculoskeletal:        General: No edema.     Cervical back: Normal range of motion.  Neurological: He is alert and oriented to person, place, and time.  Skin: Skin is warm and dry. No cyanosis. Nails show no clubbing.  Psychiatric: He has a normal mood and affect. His speech is normal and behavior is normal. Judgment and thought content normal.    Wt  Readings from Last 3 Encounters:  07/10/19 158 lb 6 oz (71.8 kg)  01/08/19 158 lb (71.7 kg)  12/16/18 160 lb (72.6 kg)     ASSESSMENT & PLAN:   1. CAD involving the native coronary arteries without angina: He is doing well and continues to be without any symptoms concerning for angina.  Continue current medical therapy including aspirin, Plavix, atorvastatin, Vascepa, losartan, and Imdur.  No longer on beta-blocker secondary to fatigue and bradycardic heart rates.  If his flatus persists, we may need to discontinue Imdur and see if this leads to symptomatic improvement.  No plans for further ischemic evaluation at this time.  2. HLD/hypertriglyceridemia: LDL of 78 with triglyceride 220 from 12/2018.  Future order placed for fasting lipid panel and CMP.  Remains on Vascepa and atorvastatin.  3. HTN: Blood pressure well controlled in the office today.  4. Tobacco use: He has not smoked since his MI in 04/2018.  5. Impaired glucose tolerance: A1c 6.3 from 01/2019.  Followed by PCP.  6. Flatus: Possibly some component of GI adverse effect related to one of his medications.  We may need to transition him off Imdur and follow-up as increased flatus can be noted in upwards of 5% of patients on this medication.  Disposition: F/u with Dr. Rockey Situ or an APP in 3 months.   Medication  Adjustments/Labs and Tests Ordered: Current medicines are reviewed at length with the patient today.  Concerns regarding medicines are outlined above. Medication changes, Labs and Tests ordered today are summarized above and listed in the Patient Instructions accessible in Encounters.   Signed, Christell Faith, PA-C 07/10/2019 3:42 PM     [address], [place] [id number] [phone]

## 2019-07-10 ENCOUNTER — Encounter: Payer: Self-pay | Admitting: Physician Assistant

## 2019-07-10 ENCOUNTER — Other Ambulatory Visit: Payer: Self-pay

## 2019-07-10 ENCOUNTER — Ambulatory Visit (INDEPENDENT_AMBULATORY_CARE_PROVIDER_SITE_OTHER): Payer: Medicare HMO | Admitting: Physician Assistant

## 2019-07-10 VITALS — BP 114/66 | HR 67 | Ht 66.0 in | Wt 158.4 lb

## 2019-07-10 DIAGNOSIS — I2510 Atherosclerotic heart disease of native coronary artery without angina pectoris: Secondary | ICD-10-CM

## 2019-07-10 DIAGNOSIS — R001 Bradycardia, unspecified: Secondary | ICD-10-CM

## 2019-07-10 DIAGNOSIS — Z87891 Personal history of nicotine dependence: Secondary | ICD-10-CM

## 2019-07-10 DIAGNOSIS — E781 Pure hyperglyceridemia: Secondary | ICD-10-CM

## 2019-07-10 DIAGNOSIS — E785 Hyperlipidemia, unspecified: Secondary | ICD-10-CM

## 2019-07-10 DIAGNOSIS — R143 Flatulence: Secondary | ICD-10-CM

## 2019-07-10 DIAGNOSIS — I10 Essential (primary) hypertension: Secondary | ICD-10-CM

## 2019-07-10 NOTE — Patient Instructions (Signed)
Medication Instructions:  Your physician recommends that you continue on your current medications as directed. Please refer to the Current Medication list given to you today.  *If you need a refill on your cardiac medications before your next appointment, please call your pharmacy*   Lab Work: Your physician recommends that you return for lab work in: next week at the medical mall. You will need to be fasting. (CMET, lipids) No appt is needed. Hours are M-F 7AM- 6 PM.  If you have labs (blood work) drawn today and your tests are completely normal, you will receive your results only by: [name] MyChart Message (if you have MyChart) OR . A paper copy in the mail If you have any lab test that is abnormal or we need to change your treatment, we will call you to review the results.   Testing/Procedures: None ordered    Follow-Up: At [facility], you and your health needs are our priority.  As part of our continuing mission to provide you with exceptional heart care, we have created designated Provider Care Teams.  These Care Teams include your primary Cardiologist (physician) and Advanced Practice Providers (APPs -  Physician Assistants and Nurse Practitioners) who all work together to provide you with the care you need, when you need it.  We recommend signing up for the patient portal called "MyChart".  Sign up information is provided on this After Visit Summary.  MyChart is used to connect with patients for Virtual Visits (Telemedicine).  Patients are able to view lab/test results, encounter notes, upcoming appointments, etc.  Non-urgent messages can be sent to your provider as well.   To learn more about what you can do with MyChart, go to [email or web address].    Your next appointment:   3 month(s)  The format for your next appointment:   In Person  Provider:    Please see [name], MD

## 2019-07-14 ENCOUNTER — Other Ambulatory Visit
Admission: RE | Admit: 2019-07-14 | Discharge: 2019-07-14 | Disposition: A | Discharge status: Home / Self Care | Payer: Medicare HMO | Attending: Physician Assistant | Admitting: Physician Assistant

## 2019-07-14 ENCOUNTER — Other Ambulatory Visit: Payer: Self-pay

## 2019-07-14 ENCOUNTER — Telehealth: Payer: Self-pay

## 2019-07-14 DIAGNOSIS — I2510 Atherosclerotic heart disease of native coronary artery without angina pectoris: Secondary | ICD-10-CM | POA: Diagnosis present

## 2019-07-14 LAB — LIPID PANEL
Cholesterol: 152 mg/dL (ref 0–200)
HDL: 29 mg/dL — ABNORMAL LOW (ref >40)
LDL Cholesterol: 106 mg/dL — ABNORMAL HIGH (ref 0–99)
Total CHOL/HDL Ratio: 5.2 RATIO
Triglycerides: 87 mg/dL (ref <150)
VLDL: 17 mg/dL (ref 0–40)

## 2019-07-14 LAB — COMPREHENSIVE METABOLIC PANEL
ALT: 21 U/L (ref 0–44)
AST: 19 U/L (ref 15–41)
Albumin: 3.8 g/dL (ref 3.5–5.0)
Alkaline Phosphatase: 70 U/L (ref 38–126)
Anion gap: 8 (ref 5–15)
BUN: 14 mg/dL (ref 8–23)
CO2: 27 mmol/L (ref 22–32)
Calcium: 9 mg/dL (ref 8.9–10.3)
Chloride: 104 mmol/L (ref 98–111)
Creatinine, Ser: 1.03 mg/dL (ref 0.61–1.24)
GFR calc Af Amer: >60 mL/min (ref >60)
GFR calc non Af Amer: >60 mL/min (ref >60)
Glucose, Bld: 130 mg/dL — ABNORMAL HIGH (ref 70–99)
Potassium: 4.4 mmol/L (ref 3.5–5.1)
Sodium: 139 mmol/L (ref 135–145)
Total Bilirubin: 0.8 mg/dL (ref 0.3–1.2)
Total Protein: 6.8 g/dL (ref 6.5–8.1)

## 2019-07-14 MED ORDER — EZETIMIBE 10 MG PO TABS: 10.0000 mg | ORAL_TABLET | Freq: Every day | ORAL | 3 refills | Status: DC

## 2019-07-14 NOTE — Telephone Encounter (Signed)
-----   Message from Theora Gianotti, NP sent at 07/14/2019  2:37 PM EDT ----- LDL above goal @ 106.  Triglycerides much better  on vascepa.  LDL prev 78 in Sept.  Please ensure that he has been taking his lipitor 80 as Rx.  If so, as he is not @ goal, would add zetia 10mg  daily next w/ plan for f/u lipids/lft's in 6 wks.   Renal & liver fxn,  lytes wnl. Gluc mildly elevated, but similar to prior recordings.

## 2019-07-14 NOTE — Telephone Encounter (Signed)
Call to patient to discuss lab results.

## 2019-08-03 ENCOUNTER — Telehealth: Payer: Self-pay | Admitting: Cardiovascular Disease

## 2019-08-03 ENCOUNTER — Other Ambulatory Visit: Payer: Self-pay

## 2019-08-03 MED ORDER — ICOSAPENT ETHYL 1 G PO CAPS: 2.0000 g | ORAL_CAPSULE | Freq: Two times a day (BID) | ORAL | 2 refills | Status: DC

## 2019-08-03 NOTE — Telephone Encounter (Signed)
*  STAT* If patient is at the pharmacy, call can be transferred to refill team.   1. Which medications need to be refilled? (please list name of each medication and dose if known) icosapent ethyl (VASCEPA) 1 g - take 2 capsules by mouth 2 times daily   2. Which pharmacy/location (including street and city if local pharmacy) is medication to be sent to? [facility] in [place]  3. Do they need a 30 day or 90 day supply? 90 day

## 2019-08-03 NOTE — Telephone Encounter (Signed)
icosapent Ethyl (VASCEPA) 1 g capsule 120 capsule 2 08/03/2019 09/02/2019   Sig - Route: Take 2 capsules (2 g total) by mouth 2 (two) times daily. - Oral   Sent to pharmacy as: icosapent Ethyl (VASCEPA) 1 g capsule   E-Prescribing Status: Receipt confirmed by pharmacy (08/03/2019 11:49 AM EDT)   Pharmacy  [facility] [place], [place]

## 2019-08-25 ENCOUNTER — Other Ambulatory Visit
Admission: RE | Admit: 2019-08-25 | Discharge: 2019-08-25 | Disposition: A | Discharge status: Home / Self Care | Payer: Medicare HMO | Source: Ambulatory Visit | Attending: Nurse Practitioner | Admitting: Nurse Practitioner

## 2019-08-25 DIAGNOSIS — I2510 Atherosclerotic heart disease of native coronary artery without angina pectoris: Secondary | ICD-10-CM | POA: Diagnosis present

## 2019-08-25 LAB — HEPATIC FUNCTION PANEL
ALT: 26 U/L (ref 0–44)
AST: 29 U/L (ref 15–41)
Albumin: 4.4 g/dL (ref 3.5–5.0)
Alkaline Phosphatase: 67 U/L (ref 38–126)
Bilirubin, Direct: <0.1 mg/dL (ref 0.0–0.2)
Total Bilirubin: 0.5 mg/dL (ref 0.3–1.2)
Total Protein: 7.3 g/dL (ref 6.5–8.1)

## 2019-08-25 LAB — LIPID PANEL
Cholesterol: 137 mg/dL (ref 0–200)
HDL: 36 mg/dL — ABNORMAL LOW (ref >40)
LDL Cholesterol: 88 mg/dL (ref 0–99)
Total CHOL/HDL Ratio: 3.8 RATIO
Triglycerides: 64 mg/dL (ref <150)
VLDL: 13 mg/dL (ref 0–40)

## 2019-08-27 ENCOUNTER — Telehealth: Payer: Self-pay

## 2019-08-27 NOTE — Telephone Encounter (Signed)
Attempted to call patient. LMTCB 08/27/2019   

## 2019-08-27 NOTE — Telephone Encounter (Signed)
-----   Message from Theora Gianotti, NP sent at 08/27/2019  3:42 PM EDT ----- LFTs wnl.  LDL cholesterol improved on zetia, though still not @ goal (currently 88, goal <70).  When this occurs, besides lifestyle modifications, wt loss, etc, we think about adding one of the injectable agents.  He and Dr. Rockey Situ can discuss @ f/u visit in June.

## 2019-08-31 NOTE — Telephone Encounter (Signed)
Patient calling to discuss recent lab testing results  ° °Please call  ° °

## 2019-08-31 NOTE — Telephone Encounter (Signed)
Spoke with patients wife per release form and reviewed results and recommendations. She verbalized understanding and will review when he returns. She had no further questions at this time.

## 2019-09-07 ENCOUNTER — Other Ambulatory Visit: Payer: Self-pay | Admitting: Cardiovascular Disease

## 2019-09-07 ENCOUNTER — Other Ambulatory Visit: Payer: Self-pay

## 2019-09-07 MED ORDER — CLOPIDOGREL BISULFATE 75 MG PO TABS: 75.0000 mg | ORAL_TABLET | Freq: Every day | ORAL | 0 refills | Status: DC

## 2019-09-07 MED ORDER — ISOSORBIDE MONONITRATE ER 30 MG PO TB24: 30.0000 mg | ORAL_TABLET | Freq: Every day | ORAL | 0 refills | Status: DC

## 2019-09-07 NOTE — Telephone Encounter (Signed)
*  STAT* If patient is at the pharmacy, call can be transferred to refill team.   1. Which medications need to be refilled? (please list name of each medication and dose if known) clopidogrel 75 mg, isosorbide mononitrate 30 mg  2. Which pharmacy/location (including street and city if local pharmacy) is medication to be sent to? [facility] in [place]  3. Do they need a 30 day or 90 day supply? 90  Scheduled with G[place] on 6/14

## 2019-10-11 NOTE — Progress Notes (Signed)
Cardiology Office Note  Date:  10/12/2019   ID:  Donald Lowery, DOB Sep 06, 1950, MRN [id number]  PCP:  [name], NP   Chief Complaint  Patient presents with  . other    3 month follow up. Meds reviewed by the pt.'s bottles. "doing well."     HPI:  Donald Lowery is a 69 y.o. male with history of  CAD with non-STEMI in 04/2018   cath  revealed severe, diffuse, distal and small vessel disease  hypertension,  hyperlipidemia, and  tobacco abuse quitting in 04/2018  who presents for follow-up of his coronary artery disease  Recently seen in clinic March 2021  Recap of cardiac history admitted to the hospital in 04/2018 with a non-STEMI with troponin peaking at 7.4. Echo showed normal LV systolic function.    cath records reviewed  revealed severe, diffuse, distal and small vessel disease without a clear culprit. Medical management was advised.   quit smoking, did not restart   in the office on 11/13/2018 bradycardic with a heart rate of 45 bpm leading to the decreasing of his Lopressor to 12.5 mg twice daily.   started on losartan 25 mg daily.  12/16/2018  been taking Lopressor 12.5 mg once daily rather than bid.  Heart rate in the office of 49 bpm following Lopressor that morning. In this setting, his Lopressor was discontinued.   He is very active at baseline, wife who presents with him today reports he is outdoors all of the time with no restrictions with his activities He denies any anginal symptoms  Tolerating Lipitor 80 Zetia 10 LDL in the 80s  EKG personally reviewed by myself on todays visit Shows normal sinus rhythm rate 67 bpm no significant ST-T wave changes    Lab Results  Component Value Date   CHOL 137 08/25/2019   HDL 36 (L) 08/25/2019   LDLCALC 88 08/25/2019   TRIG 64 08/25/2019    cath May 16, 2018 Severe three-vessel disease, Vessels are small and diffusely calcified   PMH:   has a past medical history of Coronary artery  disease, History of echocardiogram, Hyperlipidemia, NSTEMI (non-ST elevated myocardial infarction) ([place]) (05/15/2018), and Tobacco abuse.  PSH:    Past Surgical History:  Procedure Laterality Date  . BRAIN SURGERY    . CARDIAC CATHETERIZATION    . LEFT HEART CATH AND CORONARY ANGIOGRAPHY N/A 05/16/2018   Procedure: LEFT HEART CATH AND CORONARY ANGIOGRAPHY poss PCI;  Surgeon: [name], MD;  Location: [facility];  Service: Cardiovascular;  Laterality: N/A;    Current Outpatient Medications  Medication Sig Dispense Refill  . aspirin EC 81 MG EC tablet Take 1 tablet (81 mg total) by mouth daily. 30 tablet 0  . atorvastatin (LIPITOR) 80 MG tablet Take 1 tablet (80 mg total) by mouth daily at 6 PM. 90 tablet 2  . clopidogrel (PLAVIX) 75 MG tablet Take 1 tablet (75 mg total) by mouth daily. 90 tablet 0  . ezetimibe (ZETIA) 10 MG tablet Take 1 tablet (10 mg total) by mouth daily. 90 tablet 3  . icosapent Ethyl (VASCEPA) 1 g capsule Take 2 capsules (2 g total) by mouth 2 (two) times daily. 120 capsule 2  . isosorbide mononitrate (IMDUR) 30 MG 24 hr tablet Take 1 tablet (30 mg total) by mouth daily. Please keep upcoming appointment to continue further refills, Thanks ! 90 tablet 0  . losartan (COZAAR) 25 MG tablet Take 1 tablet (25 mg total) by mouth  daily. 90 tablet 3  . nitroGLYCERIN (NITROSTAT) 0.4 MG SL tablet Place 1 tablet (0.4 mg total) under the tongue every 5 (five) minutes as needed for chest pain. 25 tablet 3   No current facility-administered medications for this visit.     Allergies:   Patient has no known allergies.   Social History:  The patient  reports that he quit smoking about 16 months ago. His smoking use included cigarettes. He has a 45.00 pack-year smoking history. He has never used smokeless tobacco. He reports that he does not drink alcohol and does not use drugs.   Family History:   family history includes Diabetes in his brother; Heart Problems in his  brother and mother; Heart attack in his father.    Review of Systems: Review of Systems  Constitutional: Negative.   HENT: Negative.   Respiratory: Negative.   Cardiovascular: Negative.   Gastrointestinal: Negative.   Musculoskeletal: Negative.   Neurological: Negative.   Psychiatric/Behavioral: Negative.   All other systems reviewed and are negative.   PHYSICAL EXAM: VS:  BP 140/62 (BP Location: Left Arm, Patient Position: Sitting, Cuff Size: Normal)   Pulse 67   Ht 5\' 4"  (1.626 m)   Wt 161 lb 8 oz (73.3 kg)   BMI 27.72 kg/m  , BMI Body mass index is 27.72 kg/m. GEN: Well nourished, well developed, in no acute distress HEENT: normal Neck: no JVD, carotid bruits, or masses Cardiac: RRR; no murmurs, rubs, or gallops,no edema  Respiratory:  clear to auscultation bilaterally, normal work of breathing GI: soft, nontender, nondistended, + BS MS: no deformity or atrophy Skin: warm and dry, no rash Neuro:  Strength and sensation are intact Psych: euthymic mood, full affect  Recent Labs: 12/16/2018: TSH 3.300 07/14/2019: BUN 14; Creatinine, Ser 1.03; Potassium 4.4; Sodium 139 08/25/2019: ALT 26    Lipid Panel Lab Results  Component Value Date   CHOL 137 08/25/2019   HDL 36 (L) 08/25/2019   LDLCALC 88 08/25/2019   TRIG 64 08/25/2019    Wt Readings from Last 3 Encounters:  10/12/19 161 lb 8 oz (73.3 kg)  07/10/19 158 lb 6 oz (71.8 kg)  01/08/19 158 lb (71.7 kg)     ASSESSMENT AND PLAN:  Problem List Items Addressed This Visit    None    Visit Diagnoses    Coronary artery disease of native artery of native heart with stable angina pectoris (HCC)    -  Primary   Hyperlipidemia LDL goal <70       Tobacco abuse       Essential hypertension       Bradycardia         Coronary disease with stable angina Denies anginal symptoms, very active at baseline No further ischemic work-up needed Discussed anginal symptoms to watch for  Hypertension Blood pressure is well  controlled on today's visit. No changes made to the medications.  Hyperlipidemia On Lipitor and Zetia, LDL cholesterol at goal, LDL slightly above goal We have previously ddiscussed PCSK9 inhibitor Will monitor for now  Disposition:   F/U  12 months   Total encounter time more than 25 minutes  Greater than 50% was spent in counseling and coordination of care with the patient    Signed, Esmond Plants, M.D., Ph.D. [place], [place]

## 2019-10-12 ENCOUNTER — Ambulatory Visit: Payer: PPO | Admitting: Cardiovascular Disease

## 2019-10-12 ENCOUNTER — Encounter: Payer: Self-pay | Admitting: Cardiovascular Disease

## 2019-10-12 VITALS — BP 140/62 | HR 67 | Ht 64.0 in | Wt 161.5 lb

## 2019-10-12 DIAGNOSIS — E785 Hyperlipidemia, unspecified: Secondary | ICD-10-CM | POA: Diagnosis not present

## 2019-10-12 DIAGNOSIS — I25118 Atherosclerotic heart disease of native coronary artery with other forms of angina pectoris: Secondary | ICD-10-CM | POA: Diagnosis not present

## 2019-10-12 DIAGNOSIS — R001 Bradycardia, unspecified: Secondary | ICD-10-CM

## 2019-10-12 DIAGNOSIS — I10 Essential (primary) hypertension: Secondary | ICD-10-CM

## 2019-10-12 DIAGNOSIS — Z720 Tobacco use: Secondary | ICD-10-CM

## 2019-10-12 MED ORDER — ISOSORBIDE MONONITRATE ER 30 MG PO TB24: 30.0000 mg | ORAL_TABLET | Freq: Every day | ORAL | 3 refills | Status: DC

## 2019-10-12 MED ORDER — EZETIMIBE 10 MG PO TABS: 10.0000 mg | ORAL_TABLET | Freq: Every day | ORAL | 3 refills | Status: DC

## 2019-10-12 MED ORDER — ATORVASTATIN CALCIUM 80 MG PO TABS: 80.0000 mg | ORAL_TABLET | Freq: Every day | ORAL | 3 refills | Status: DC

## 2019-10-12 MED ORDER — CLOPIDOGREL BISULFATE 75 MG PO TABS: 75.0000 mg | ORAL_TABLET | Freq: Every day | ORAL | 3 refills | Status: DC

## 2019-10-12 NOTE — Patient Instructions (Signed)

## 2019-10-23 ENCOUNTER — Other Ambulatory Visit: Payer: Self-pay

## 2019-10-23 ENCOUNTER — Other Ambulatory Visit: Payer: Self-pay | Admitting: Cardiovascular Disease

## 2019-10-23 MED ORDER — ICOSAPENT ETHYL 1 G PO CAPS: 2.0000 g | ORAL_CAPSULE | Freq: Two times a day (BID) | ORAL | 11 refills | Status: DC

## 2019-10-23 NOTE — Telephone Encounter (Signed)
°*  STAT* If patient is at the pharmacy, call can be transferred to refill team.   1. Which medications need to be refilled? (please list name of each medication and dose if known) vascepa 1 g take 2 capsules bid  2. Which pharmacy/location (including street and city if local pharmacy) is medication to be sent to? [facility] in [place]  3. Do they need a 30 day or 90 day supply? [place]

## 2019-10-23 NOTE — Telephone Encounter (Signed)
Pharmacy sent refill request which has already been authorized today.

## 2019-10-26 MED ORDER — ICOSAPENT ETHYL 1 G PO CAPS: 2.0000 g | ORAL_CAPSULE | Freq: Two times a day (BID) | ORAL | 11 refills | Status: DC

## 2019-10-26 NOTE — Telephone Encounter (Signed)
Requested Prescriptions   Signed Prescriptions Disp Refills   icosapent Ethyl (VASCEPA) 1 g capsule 120 capsule 11    Sig: Take 2 capsules (2 g total) by mouth 2 (two) times daily.    Authorizing Provider: [name]    Ordering User: Raelene Bott, Makani Seckman L

## 2019-10-26 NOTE — Telephone Encounter (Signed)
Patients wife called in stating [facility] in [place] did not receive the prescription. Asking if it can be sent in again.

## 2019-11-02 ENCOUNTER — Other Ambulatory Visit: Payer: Self-pay | Admitting: General Practice

## 2019-11-02 DIAGNOSIS — I10 Essential (primary) hypertension: Secondary | ICD-10-CM

## 2019-12-07 ENCOUNTER — Telehealth: Payer: Self-pay | Admitting: Cardiovascular Disease

## 2019-12-07 NOTE — Telephone Encounter (Signed)
*  STAT* If patient is at the pharmacy, call can be transferred to refill team.   1. Which medications need to be refilled? (please list name of each medication and dose if known)   Plavix 75 mg po q d  Isosorbide 30 mg po q d    2. Which pharmacy/location (including street and city if local pharmacy) is medication to be sent to?   [facility] [place]   3. Do they need a 30 day or 90 day supply? [place]

## 2019-12-07 NOTE — Telephone Encounter (Signed)
One year supply of both medications sent to pharmacy on 10/02/2019

## 2019-12-07 NOTE — Telephone Encounter (Signed)
Sent on 10/12/2019

## 2020-02-03 ENCOUNTER — Other Ambulatory Visit: Payer: Self-pay | Admitting: Cardiovascular Disease

## 2020-02-03 DIAGNOSIS — I10 Essential (primary) hypertension: Secondary | ICD-10-CM

## 2020-05-31 DIAGNOSIS — E119 Type 2 diabetes mellitus without complications: Secondary | ICD-10-CM | POA: Insufficient documentation

## 2020-09-16 ENCOUNTER — Other Ambulatory Visit: Payer: Self-pay

## 2020-09-16 DIAGNOSIS — Z7982 Long term (current) use of aspirin: Secondary | ICD-10-CM | POA: Diagnosis not present

## 2020-09-16 DIAGNOSIS — R109 Unspecified abdominal pain: Secondary | ICD-10-CM | POA: Insufficient documentation

## 2020-09-16 DIAGNOSIS — Z79899 Other long term (current) drug therapy: Secondary | ICD-10-CM | POA: Diagnosis not present

## 2020-09-16 DIAGNOSIS — Z7902 Long term (current) use of antithrombotics/antiplatelets: Secondary | ICD-10-CM | POA: Insufficient documentation

## 2020-09-16 DIAGNOSIS — Z87891 Personal history of nicotine dependence: Secondary | ICD-10-CM | POA: Diagnosis not present

## 2020-09-16 DIAGNOSIS — I2510 Atherosclerotic heart disease of native coronary artery without angina pectoris: Secondary | ICD-10-CM | POA: Diagnosis not present

## 2020-09-16 LAB — COMPREHENSIVE METABOLIC PANEL
ALT: 14 U/L (ref 0–44)
AST: 18 U/L (ref 15–41)
Albumin: 3.5 g/dL (ref 3.5–5.0)
Alkaline Phosphatase: 96 U/L (ref 38–126)
Anion gap: 9 (ref 5–15)
BUN: 16 mg/dL (ref 8–23)
CO2: 27 mmol/L (ref 22–32)
Calcium: 9.9 mg/dL (ref 8.9–10.3)
Chloride: 95 mmol/L — ABNORMAL LOW (ref 98–111)
Creatinine, Ser: 0.96 mg/dL (ref 0.61–1.24)
GFR, Estimated: >60 mL/min (ref >60)
Glucose, Bld: 156 mg/dL — ABNORMAL HIGH (ref 70–99)
Potassium: 4.3 mmol/L (ref 3.5–5.1)
Sodium: 131 mmol/L — ABNORMAL LOW (ref 135–145)
Total Bilirubin: 0.8 mg/dL (ref 0.3–1.2)
Total Protein: 7 g/dL (ref 6.5–8.1)

## 2020-09-16 LAB — CBC
HCT: 31.9 % — ABNORMAL LOW (ref 39.0–52.0)
Hemoglobin: 10.9 g/dL — ABNORMAL LOW (ref 13.0–17.0)
MCH: 29 pg (ref 26.0–34.0)
MCHC: 34.2 g/dL (ref 30.0–36.0)
MCV: 84.8 fL (ref 80.0–100.0)
Platelets: 324 10*3/uL (ref 150–400)
RBC: 3.76 MIL/uL — ABNORMAL LOW (ref 4.22–5.81)
RDW: 12.4 % (ref 11.5–15.5)
WBC: 13.1 10*3/uL — ABNORMAL HIGH (ref 4.0–10.5)
nRBC: 0 % (ref 0.0–0.2)

## 2020-09-16 LAB — LIPASE, BLOOD: Lipase: 36 U/L (ref 11–51)

## 2020-09-16 NOTE — ED Triage Notes (Signed)
Pt reports epigastric pain that radiates around to his mid back described as burning since last night. Hx of same in the past but usually resolves on its own. Denies shortness of breath, chest pain, n/v/d. Denies urinary symptoms.

## 2020-09-17 ENCOUNTER — Emergency Department: Payer: Medicare HMO

## 2020-09-17 ENCOUNTER — Emergency Department
Admission: EM | Admit: 2020-09-17 | Discharge: 2020-09-17 | Disposition: A | Discharge status: Home / Self Care | Payer: Medicare HMO | Attending: Emergency Medicine | Admitting: Emergency Medicine

## 2020-09-17 DIAGNOSIS — R109 Unspecified abdominal pain: Secondary | ICD-10-CM

## 2020-09-17 LAB — TROPONIN I (HIGH SENSITIVITY): Troponin I (High Sensitivity): 12 ng/L (ref <18)

## 2020-09-17 MED ORDER — FENTANYL CITRATE (PF) 100 MCG/2ML IJ SOLN
50.0000 ug | Freq: Once | INTRAMUSCULAR | Status: AC
  Administered 2020-09-17: 50 ug via INTRAVENOUS
  Filled 2020-09-17: qty 2

## 2020-09-17 MED ORDER — DICYCLOMINE HCL 10 MG PO CAPS: 10.0000 mg | ORAL_CAPSULE | Freq: Three times a day (TID) | ORAL | 0 refills | Status: AC | PRN

## 2020-09-17 MED ORDER — MORPHINE SULFATE (PF) 4 MG/ML IV SOLN
4.0000 mg | Freq: Once | INTRAVENOUS | Status: AC
  Administered 2020-09-17: 4 mg via INTRAMUSCULAR
  Filled 2020-09-17: qty 1

## 2020-09-17 MED ORDER — SODIUM CHLORIDE 0.9 % IV BOLUS
1000.0000 mL | Freq: Once | INTRAVENOUS | Status: AC
  Administered 2020-09-17: 1000 mL via INTRAVENOUS

## 2020-09-17 MED ORDER — ONDANSETRON 4 MG PO TBDP
4.0000 mg | ORAL_TABLET | Freq: Once | ORAL | Status: AC
  Administered 2020-09-17: 4 mg via ORAL
  Filled 2020-09-17: qty 1

## 2020-09-17 NOTE — Discharge Instructions (Signed)
Please call the number provided for general surgery to discuss your abdominal pain and gallbladder.  Please take your medication as prescribed, as needed.  If your abdominal pain returns/worsens or you develop a fever please return to the emergency department for further evaluation.  Otherwise please follow-up with your doctor in 2 days for recheck/reevaluation. Your CT scan does show a 7 mm pulmonary nodule.  Please follow-up with your doctor for repeat CT imaging in approximately 12 months to ensure no enlargement of this nodule.

## 2020-09-17 NOTE — ED Provider Notes (Signed)
[facility] Emergency Department Provider Note  Time seen: 1:00 AM  I have reviewed the triage vital signs and the nursing notes.   HISTORY  Chief Complaint Abdominal Pain   HPI Donald Lowery is a 70 y.o. male with a past medical history of CAD, MI, presents to the emergency department for abdominal pain.  According to the patient over the past 2 days he has been experiencing an aching pain in the center of his abdomen with some radiation to his back at times.  Patient states he has had this pain in the past as well but states has never lasted this long.  Patient states normal bowel movement yesterday denies any nausea vomiting or diarrhea.  Denies any dysuria.  No fever.  Describes the pain as 10/10 aching pain in the center of his abdomen.   Past Medical History:  Diagnosis Date  . Coronary artery disease    a. 04/2018 NSTEMI/Cath: LM nl, LAD 50p, 43m/d diffuse-small caliber, LCX heavily Ca2+ sev prox/mid dzs, RCA 90 diff distal dzs into RPL and RPDA. EF 50-55%-->Med Rx.  [name] History of echocardiogram    a. 04/2018 Echo: EF 55-60%, no rwma, mild MR, mildly dil LA. Nl RV fxn.  . Hyperlipidemia   . NSTEMI (non-ST elevated myocardial infarction) ([place]) 05/15/2018  . Tobacco abuse    a. Quit 04/2018.    Patient Active Problem List   Diagnosis Date Noted  . NSTEMI (non-ST elevated myocardial infarction) ([place]) 05/15/2018    Past Surgical History:  Procedure Laterality Date  . BRAIN SURGERY    . CARDIAC CATHETERIZATION    . LEFT HEART CATH AND CORONARY ANGIOGRAPHY N/A 05/16/2018   Procedure: LEFT HEART CATH AND CORONARY ANGIOGRAPHY poss PCI;  Surgeon: [name], MD;  Location: [facility];  Service: Cardiovascular;  Laterality: N/A;    Prior to Admission medications   Medication Sig Start Date End Date Taking? Authorizing Provider  aspirin EC 81 MG EC tablet Take 1 tablet (81 mg total) by mouth daily. 05/17/18   Hillary Bow, MD  atorvastatin  (LIPITOR) 80 MG tablet Take 1 tablet (80 mg total) by mouth daily at 6 PM. 10/12/19   Gollan, Kathlene November, MD  clopidogrel (PLAVIX) 75 MG tablet Take 1 tablet (75 mg total) by mouth daily. 10/12/19   [name], MD  ezetimibe (ZETIA) 10 MG tablet Take 1 tablet (10 mg total) by mouth daily. 10/12/19 01/10/20  [name], MD  icosapent Ethyl (VASCEPA) 1 g capsule Take 2 capsules (2 g total) by mouth 2 (two) times daily. 10/26/19 11/25/19  [name], MD  isosorbide mononitrate (IMDUR) 30 MG 24 hr tablet Take 1 tablet (30 mg total) by mouth daily. 10/12/19   [name], MD  losartan (COZAAR) 25 MG tablet Take 1 tablet by mouth once daily 02/03/20   [name], MD  nitroGLYCERIN (NITROSTAT) 0.4 MG SL tablet Place 1 tablet (0.4 mg total) under the tongue every 5 (five) minutes as needed for chest pain. 06/05/18 07/09/28  Theora Gianotti, NP    No Known Allergies  Family History  Problem Relation Age of Onset  . Heart Problems Mother   . Heart attack Father   . Diabetes Brother   . Heart Problems Brother     Social History Social History   Tobacco Use  . Smoking status: Former Smoker    Packs/day: 1.00    Years: 45.00    Pack years:  45.00    Types: Cigarettes    Quit date: 05/15/2018    Years since quitting: 2.3  . Smokeless tobacco: Never Used  Vaping Use  . Vaping Use: Never used  Substance Use Topics  . Alcohol use: Never  . Drug use: Never    Review of Systems Constitutional: Negative for fever. Cardiovascular: Negative for chest pain. Respiratory: Negative for shortness of breath. Gastrointestinal: Significant central abdominal pain.  Negative nausea vomiting or diarrhea Genitourinary: Negative for urinary compaints Musculoskeletal: Negative for musculoskeletal complaints Neurological: Negative for headache All other ROS negative  ____________________________________________   PHYSICAL EXAM:  VITAL SIGNS: ED Triage Vitals  Enc  Vitals Group     BP 09/16/20 2015 (!) 155/67     Pulse Rate 09/16/20 2015 77     Resp 09/16/20 2015 18     Temp 09/16/20 2015 98.8 F (37.1 C)     Temp Source 09/16/20 2015 Oral     SpO2 09/16/20 2015 100 %     Weight 09/16/20 2019 163 lb 2.3 oz (74 kg)     Height 09/16/20 2019 5\' 4"  (1.626 m)     Head Circumference --      Peak Flow --      Pain Score 09/16/20 2019 9     Pain Loc --      Pain Edu? --      Excl. in [place]? --    Constitutional: Alert and oriented. Well appearing and in no distress. Eyes: Normal exam ENT      Head: Normocephalic and atraumatic.      Mouth/Throat: Mucous membranes are moist. Cardiovascular: Normal rate, regular rhythm. Respiratory: Normal respiratory effort without tachypnea nor retractions. Breath sounds are clear  Gastrointestinal: Soft, mild abdominal distention with tympanic percussion.  Mild to moderate central tenderness to palpation mostly between the epigastrium and umbilical region Musculoskeletal: Nontender with normal range of motion in all extremities.  Neurologic:  Normal speech and language. No gross focal neurologic deficits  Skin:  Skin is warm, dry and intact.  Psychiatric: Mood and affect are normal.  ____________________________________________    EKG  EKG viewed and interpreted by myself shows a normal sinus rhythm at 75 bpm with a narrow QRS, normal axis, normal intervals, no concerning ST changes.  ____________________________________________    RADIOLOGY  Ultrasound shows contracted gallbladder with multiple gallstones.  No signs of cholecystitis.  CT scan shows cholelithiasis.  Also shows a 7 mm right middle lobe pulmonary nodule we will have the patient follow-up with his doctor for repeat CT imaging.  ____________________________________________   INITIAL IMPRESSION / ASSESSMENT AND PLAN / ED COURSE  Pertinent labs & imaging results that were available during my care of the patient were reviewed by me and  considered in my medical decision making (see chart for details).   Patient presents to the emergency department for central abdominal pain over the past 2 days.  Describes as 10/10 in severity.  Differential is quite broad at this time but would include gallbladder or biliary pathology, pancreatitis, bowel obstruction or volvulus, enteritis or colitis.  We will check labs and obtain a CT scan of the abdomen/pelvis.  Patient's labs are largely nonrevealing besides a mild leukocytosis of 13,000, LFTs and lipase are normal.  CT pending.  Urine pending.  CT scan shows gallstones with an equivocal appearing gallbladder.  Ultrasound shows gallstones but no signs of cholecystitis.  Patient states his pain is essentially gone besides a mild discomfort in his back.  Patient has not been able to provide a urine sample and states he just urinated recently and forgot to collect the urine.  Patient denies any urinary symptoms I offered to keep the patient in the emergency department to begin checking urinalysis, but the patient states he is not having any urinary symptoms and would rather go home.  We will discharge patient with a prescription for Bentyl if he has any further pain.  I did discuss with the patient if his pain returns or significant or if he develops a fever he is to return to the emergency department.  Otherwise patient will follow up with his doctor.  I will also refer to general surgery given his possibility of biliary colic.  Donald Lowery was evaluated in Emergency Department on 09/17/2020 for the symptoms described in the history of present illness. He was evaluated in the context of the global COVID-19 pandemic, which necessitated consideration that the patient might be at risk for infection with the SARS-CoV-2 virus that causes COVID-19. Institutional protocols and algorithms that pertain to the evaluation of patients at risk for COVID-19 are in a state of rapid change based on information  released by regulatory bodies including the CDC and federal and state organizations. These policies and algorithms were followed during the patient's care in the ED.  ____________________________________________   FINAL CLINICAL IMPRESSION(S) / ED DIAGNOSES  Abdominal pain   Harvest Dark, MD 09/17/20 [phone]

## 2020-09-17 NOTE — ED Notes (Signed)
ED Provider at bedside. 

## 2020-09-20 ENCOUNTER — Ambulatory Visit: Payer: Self-pay | Admitting: General Surgery

## 2020-09-20 NOTE — H&P (Signed)
PATIENT PROFILE: Donald Lowery is a 70 y.o. male who presents to the Clinic for consultation at the request of Dr. Kerman Passey for evaluation of cholelithiasis.  PCP:  [name], NP  HISTORY OF PRESENT ILLNESS: Donald Lowery reports he developed an episode of abdominal pain 3 days ago.  He went to the emergency room.  He reported the pain started on the abdominal area.  The pain radiated to his back.  The pain was described as burning.  There was no aggravating factors identified.  Alleviating factor was pain medication at the emergency room.  Patient denies nausea or vomiting.  Patient denies chest pain.  Patient denies shortness of breath.  In the ED he had labs that shows no leukocytosis.  Bilirubin and liver enzymes were within normal limits.  He had a CT scan and ultrasound of the abdomen that shows calcified gallstones.  There was no other intra-abdominal pathology.  I personally evaluated the images of the ultrasound on the CT scan.  Patient has been evaluated by cardiology.  Last evaluation in June 2021.  During that evaluation patient was very active without chest pain.  Last echo with ejection fraction of 50 to 55%.  Patient today denies any chest pain.  Patient continues to be very active without chest pain or shortness of breath.  He is currently on Plavix and aspirin.  PROBLEM LIST:         Problem List  Date Reviewed: 05/31/2020         Noted   Type 2 diabetes mellitus without complication, without long-term current use of insulin (CMS-HCC) 05/2020   Overview    A1c 7.1%, up from 1 yr ago at 6.3%.       Aortic atherosclerosis (CMS-HCC) 02/10/2019   Overview    Noted on CT.       Hypertension Unknown   Hyperlipidemia Unknown   CAD (coronary artery disease) 05/16/2018   Overview    Severe diffuse distal & small vessel dz w/o clear culprit for MI on 05/15/18.       Emphysema of lung (CMS-HCC) 05/15/2018   Overview    Incidental  finding on chest CT--paraseptal emphysema          GENERAL REVIEW OF SYSTEMS:   General ROS: negative for - chills, fatigue, fever, weight gain or weight loss Allergy and Immunology ROS: negative for - hives  Hematological and Lymphatic ROS: negative for - bleeding problems or bruising, negative for palpable nodes Endocrine ROS: negative for - heat or cold intolerance, hair changes Respiratory ROS: negative for - cough, shortness of breath or wheezing Cardiovascular ROS: no chest pain or palpitations GI ROS: negative for nausea, vomiting, diarrhea, constipation.  Positive for abdominal pain Musculoskeletal ROS: negative for - joint swelling or muscle pain.  Positive for back pain Neurological ROS: negative for - confusion, syncope Dermatological ROS: negative for pruritus and rash Psychiatric: negative for anxiety, depression, difficulty sleeping and memory loss  MEDICATIONS: Current Medications        Current Outpatient Medications  Medication Sig Dispense Refill  . aspirin 81 MG EC tablet Take 81 mg by mouth once daily    . atorvastatin (LIPITOR) 80 MG tablet Take 80 mg by mouth once daily    . clopidogreL (PLAVIX) 75 mg tablet Take 75 mg by mouth once daily    . ezetimibe (ZETIA) 10 mg tablet Take 10 mg by mouth once daily    . isosorbide mononitrate (IMDUR) 30 MG ER tablet Take  30 mg by mouth once daily    . losartan (COZAAR) 25 MG tablet Take 25 mg by mouth once daily    . metFORMIN (GLUCOPHAGE) 500 MG tablet Take 1 tablet (500 mg total) by mouth daily with breakfast 90 tablet 1  . nitroGLYcerin (NITROSTAT) 0.4 MG SL tablet Place 0.4 mg under the tongue once as needed    . VASCEPA 1 gram Cap Take 2 capsules by mouth 2 (two) times daily     No current facility-administered medications for this visit.      ALLERGIES: Patient has no known allergies.  PAST MEDICAL HISTORY:     Past Medical History:  Diagnosis Date  . Aortic atherosclerosis  (CMS-HCC) 02/10/2019   Noted on CT.  [name] CAD (coronary artery disease) 05/16/2018   Severe diffuse distal & small vessel dz w/o clear culprit for MI on 05/15/18.  EF nml per echo.  . Emphysema of lung (CMS-HCC) 05/15/2018   Incidental finding on chest CT--paraseptal emphysema  . Hepatic steatosis 01/2019   Incidental finding on chest CT.  [name] Hyperlipidemia   . Hypertension   . NSTEMI (non-ST elevated myocardial infarction) (CMS-HCC) 05/15/2018   Medically managed  . Type 2 diabetes mellitus without complication, without long-term current use of insulin (CMS-HCC) 05/2020   A1c 7.1%, up from 1 yr ago at 6.3%.    PAST SURGICAL HISTORY:      Past Surgical History:  Procedure Laterality Date  . 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  . Left Heart Catheterization  05/15/2018   LM nl, LAD 50p, 98md diffuse-small caliber, LCX heavily Ca2+ sev prox/mid dzs, RCA 90 diff distal dzs into RPL and RPDA. EF 50-55%.     FAMILY HISTORY:      Family History  Problem Relation Age of Onset  . Heart disease Mother   . Myocardial Infarction (Heart attack) Father   . Diabetes type II Brother   . Heart disease Brother   . Myocardial Infarction (Heart attack) Brother   . Lung cancer Brother   . Multiple sclerosis Sister      SOCIAL HISTORY: Social History          Socioeconomic History  . Marital status: Married    Spouse name: Donald Lowery . Years of education: Didn't graduate H.S.  Occupational History  . Occupation: Retired  Tobacco Use  . Smoking status: Former Smoker    Packs/day: 1.00    Years: 45.00    Pack years: 45.00    Types: Cigarettes    Quit date: 05/15/2018    Years since quitting: 2.3  . Smokeless tobacco: Never Used  Vaping Use  . Vaping Use: Never used  Substance and Sexual Activity  . Alcohol use: Never  . Drug use: Never  . Sexual activity: Not  Currently    Partners: Female      PHYSICAL EXAM:    Vitals:   09/20/20 1404  BP: 125/67  Pulse: 86   Body mass index is 27.55 kg/m. Weight: 73.9 kg (163 lb)   GENERAL: Alert, active, oriented x3  HEENT: Pupils equal reactive to light. Extraocular movements are intact. Sclera clear. Palpebral conjunctiva normal red color.Pharynx clear.  NECK: Supple with no palpable mass and no adenopathy.  LUNGS: Sound clear with no rales rhonchi or wheezes.  HEART: Regular rhythm S1 and S2 without murmur.  ABDOMEN: Soft and depressible, nontender with no palpable mass, no hepatomegaly.  EXTREMITIES: Well-developed well-nourished symmetrical with no dependent edema.  NEUROLOGICAL: Awake alert oriented, facial expression symmetrical, moving all extremities.  REVIEW OF DATA: I have reviewed the following data today:      No visits with results within 3 Month(s) from this visit.  Latest known visit with results is:  Office Visit on 05/31/2020  Component Date Value  . WBC (White Blood Cell Co* 05/31/2020 11.7 (!)  . RBC (Red Blood Cell Coun* 05/31/2020 4.78   . Hemoglobin 05/31/2020 14.3   . Hematocrit 05/31/2020 43.1   . MCV (Mean Corpuscular Vo* 05/31/2020 90.2   . MCH (Mean Corpuscular He* 05/31/2020 29.9   . MCHC (Mean Corpuscular H* 05/31/2020 33.2   . Platelet Count 05/31/2020 216   . RDW-CV (Red Cell Distrib* 05/31/2020 12.4   . MPV (Mean Platelet Volum* 05/31/2020 10.1   . Neutrophils 05/31/2020 8.10 (!)  . Lymphocytes 05/31/2020 2.90   . Mixed Count 05/31/2020 0.70   . Neutrophil % 05/31/2020 69.0   . Lymphocyte % 05/31/2020 25.1   . Mixed % 05/31/2020 5.9   . Glucose 05/31/2020 161 (!)  . Sodium 05/31/2020 135 (!)  . Potassium 05/31/2020 4.6   . Chloride 05/31/2020 101   . Carbon Dioxide (CO2) 05/31/2020 32.3 (!)  . Urea Nitrogen (BUN) 05/31/2020 16   . Creatinine 05/31/2020 1.1   . Glomerular Filtration Ra* 05/31/2020 66   . Calcium 05/31/2020  10.1   . AST  05/31/2020 17   . ALT  05/31/2020 24   . Alk Phos (alkaline Phosp* 05/31/2020 93   . Albumin 05/31/2020 4.5   . Bilirubin, Total 05/31/2020 0.7   . Protein, Total 05/31/2020 7.2   . A/G Ratio 05/31/2020 1.7   . Hemoglobin A1C 05/31/2020 7.1 (!)  . Average Blood Glucose (C* 05/31/2020 157   . Cholesterol, Total 05/31/2020 130   . Triglyceride 05/31/2020 88   . HDL (High Density Lipopr* 05/31/2020 32.4   . LDL Calculated 05/31/2020 80   . VLDL Cholesterol 05/31/2020 18   . Cholesterol/HDL Ratio 05/31/2020 4.0   . Thyroid Stimulating Horm* 05/31/2020 3.802   . Creatinine, Random Urine 05/31/2020 174.1   . Urine Albumin, Random 05/31/2020 <7   . Urine Albumin/Creatinine* 05/31/2020 <4.0      ASSESSMENT: Mr. Haskell is a 70 y.o. male presenting for consultation for cholelithiasis.    Patient was oriented about the diagnosis of cholelithiasis. Also oriented about what is the gallbladder, its anatomy and function and the implications of having stones. The patient was oriented about the treatment alternatives (observation vs cholecystectomy). Patient was oriented that a low percentage of patient will continue to have similar pain symptoms even after the gallbladder is removed. Surgical technique (open vs laparoscopic) was discussed. It was also discussed the goals of the surgery (decrease the pain episodes and avoid the risk of cholecystitis) and the risk of surgery including: bleeding, infection, common bile duct injury, stone retention, injury to other organs such as bowel, liver, stomach, other complications such as hernia, bowel obstruction among others. Also discussed with patient about anesthesia and its complications such as: reaction to medications, pneumonia, heart complications, death, among others.  I was very clear with the patient that his back pain may be unrelated to the gallbladder.  There is a high chance that even with cholecystectomy his back pain will continue  and he will need to be evaluated by physical therapist.  Even though his coronary artery disease and hypertension has been well controlled and  stable I would consult cardiology.  Patient oriented to hold Plavix for 7 days and aspirin for 5 days before the procedure.  Cholelithiasis without cholecystitis [K80.20]  PLAN: 1. Robotic assisted laparoscopic cholecystectomy (16579) 2.  CBC, CMP 3.  Cardiology clearance clearance (Dr. Rockey Situ) 4.  Do not take aspirin 5 days before the procedure 5.  Do not take clopidogrel (plavix) 7 days before the procedure.  6.  Contact us if has any question or concern.   Patient and his wife verbalized understanding, all questions were answered, and were agreeable with the plan outlined above.    Herbert Pun, MD  Electronically signed by Herbert Pun, MD

## 2020-09-21 ENCOUNTER — Emergency Department: Payer: Medicare HMO

## 2020-09-21 ENCOUNTER — Emergency Department
Admission: EM | Admit: 2020-09-21 | Discharge: 2020-09-21 | Disposition: A | Discharge status: Home / Self Care | Payer: Medicare HMO | Attending: Emergency Medicine | Admitting: Emergency Medicine

## 2020-09-21 ENCOUNTER — Other Ambulatory Visit: Payer: Self-pay

## 2020-09-21 DIAGNOSIS — Z79899 Other long term (current) drug therapy: Secondary | ICD-10-CM | POA: Insufficient documentation

## 2020-09-21 DIAGNOSIS — M546 Pain in thoracic spine: Secondary | ICD-10-CM | POA: Diagnosis not present

## 2020-09-21 DIAGNOSIS — C3492 Malignant neoplasm of unspecified part of left bronchus or lung: Secondary | ICD-10-CM | POA: Insufficient documentation

## 2020-09-21 DIAGNOSIS — C3412 Malignant neoplasm of upper lobe, left bronchus or lung: Secondary | ICD-10-CM | POA: Insufficient documentation

## 2020-09-21 DIAGNOSIS — Z87891 Personal history of nicotine dependence: Secondary | ICD-10-CM | POA: Diagnosis not present

## 2020-09-21 DIAGNOSIS — G893 Neoplasm related pain (acute) (chronic): Secondary | ICD-10-CM | POA: Insufficient documentation

## 2020-09-21 DIAGNOSIS — Z7982 Long term (current) use of aspirin: Secondary | ICD-10-CM | POA: Diagnosis not present

## 2020-09-21 DIAGNOSIS — G8929 Other chronic pain: Secondary | ICD-10-CM | POA: Insufficient documentation

## 2020-09-21 DIAGNOSIS — M79602 Pain in left arm: Secondary | ICD-10-CM

## 2020-09-21 DIAGNOSIS — R918 Other nonspecific abnormal finding of lung field: Secondary | ICD-10-CM | POA: Insufficient documentation

## 2020-09-21 DIAGNOSIS — I2510 Atherosclerotic heart disease of native coronary artery without angina pectoris: Secondary | ICD-10-CM | POA: Insufficient documentation

## 2020-09-21 DIAGNOSIS — Q790 Congenital diaphragmatic hernia: Secondary | ICD-10-CM

## 2020-09-21 DIAGNOSIS — Z7902 Long term (current) use of antithrombotics/antiplatelets: Secondary | ICD-10-CM | POA: Diagnosis not present

## 2020-09-21 DIAGNOSIS — M79622 Pain in left upper arm: Secondary | ICD-10-CM | POA: Diagnosis present

## 2020-09-21 HISTORY — DX: Other nonspecific abnormal finding of lung field: R91.8

## 2020-09-21 HISTORY — DX: Congenital diaphragmatic hernia: Q79.0

## 2020-09-21 HISTORY — DX: Malignant neoplasm of upper lobe, left bronchus or lung: C34.12

## 2020-09-21 LAB — CBC WITH DIFFERENTIAL/PLATELET
Abs Immature Granulocytes: 0.14 10*3/uL — ABNORMAL HIGH (ref 0.00–0.07)
Basophils Absolute: 0.1 10*3/uL (ref 0.0–0.1)
Basophils Relative: 1 %
Eosinophils Absolute: 0.2 10*3/uL (ref 0.0–0.5)
Eosinophils Relative: 2 %
HCT: 32.9 % — ABNORMAL LOW (ref 39.0–52.0)
Hemoglobin: 11.2 g/dL — ABNORMAL LOW (ref 13.0–17.0)
Immature Granulocytes: 1 %
Lymphocytes Relative: 12 %
Lymphs Abs: 1.5 10*3/uL (ref 0.7–4.0)
MCH: 28.9 pg (ref 26.0–34.0)
MCHC: 34 g/dL (ref 30.0–36.0)
MCV: 84.8 fL (ref 80.0–100.0)
Monocytes Absolute: 0.9 10*3/uL (ref 0.1–1.0)
Monocytes Relative: 7 %
Neutro Abs: 9.5 10*3/uL — ABNORMAL HIGH (ref 1.7–7.7)
Neutrophils Relative %: 77 %
Platelets: 329 10*3/uL (ref 150–400)
RBC: 3.88 MIL/uL — ABNORMAL LOW (ref 4.22–5.81)
RDW: 12.7 % (ref 11.5–15.5)
WBC: 12.3 10*3/uL — ABNORMAL HIGH (ref 4.0–10.5)
nRBC: 0 % (ref 0.0–0.2)

## 2020-09-21 LAB — BASIC METABOLIC PANEL
Anion gap: 8 (ref 5–15)
BUN: 13 mg/dL (ref 8–23)
CO2: 23 mmol/L (ref 22–32)
Calcium: 9.5 mg/dL (ref 8.9–10.3)
Chloride: 99 mmol/L (ref 98–111)
Creatinine, Ser: 0.96 mg/dL (ref 0.61–1.24)
GFR, Estimated: >60 mL/min (ref >60)
Glucose, Bld: 160 mg/dL — ABNORMAL HIGH (ref 70–99)
Potassium: 4.2 mmol/L (ref 3.5–5.1)
Sodium: 130 mmol/L — ABNORMAL LOW (ref 135–145)

## 2020-09-21 LAB — TROPONIN I (HIGH SENSITIVITY): Troponin I (High Sensitivity): 10 ng/L (ref <18)

## 2020-09-21 MED ORDER — ACETAMINOPHEN 500 MG PO TABS
1000.0000 mg | ORAL_TABLET | Freq: Once | ORAL | Status: AC
  Administered 2020-09-21: 1000 mg via ORAL
  Filled 2020-09-21: qty 2

## 2020-09-21 MED ORDER — OXYCODONE HCL 5 MG PO TABS: 5.0000 mg | ORAL_TABLET | Freq: Four times a day (QID) | ORAL | 0 refills | Status: DC | PRN

## 2020-09-21 MED ORDER — OXYCODONE HCL 5 MG PO TABS
5.0000 mg | ORAL_TABLET | Freq: Once | ORAL | Status: AC
  Administered 2020-09-21: 5 mg via ORAL
  Filled 2020-09-21: qty 1

## 2020-09-21 MED ORDER — IOHEXOL 300 MG/ML  SOLN
75.0000 mL | Freq: Once | INTRAMUSCULAR | Status: AC | PRN
  Administered 2020-09-21: 75 mL via INTRAVENOUS

## 2020-09-21 MED ORDER — MORPHINE SULFATE (PF) 4 MG/ML IV SOLN
4.0000 mg | Freq: Once | INTRAVENOUS | Status: AC
  Administered 2020-09-21: 4 mg via INTRAVENOUS
  Filled 2020-09-21: qty 1

## 2020-09-21 MED ORDER — KETOROLAC TROMETHAMINE 30 MG/ML IJ SOLN
15.0000 mg | Freq: Once | INTRAMUSCULAR | Status: AC
  Administered 2020-09-21: 15 mg via INTRAVENOUS
  Filled 2020-09-21: qty 1

## 2020-09-21 NOTE — ED Notes (Signed)
ED Provider at bedside. 

## 2020-09-21 NOTE — Discharge Instructions (Signed)
As we discussed, there is a mass in the upper part of your left-sided lung, it is about 7 cm across or nearly 3 inches, and this is the likely source of your pain.  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   Please follow-up with both the palliative care group and oncologist/cancer doctor.  I have attached the phone numbers for both of them.  They can discuss with you your options as well as provide you with additional pain medicine to help with your symptoms.  Use Tylenol for pain and fevers.  Up to 1000 mg per dose, up to 4 times per day.  Do not take more than 4000 mg of Tylenol/acetaminophen within 24 hours.. Also use the pain medicine, oxycodone, that you are prescribed.  If you develop any fevers, uncontrolled pain, inability to breathe, please return to the ED.

## 2020-09-21 NOTE — ED Provider Notes (Signed)
[facility] Emergency Department Provider Note  ____________________________________________   Event Date/Time   First MD Initiated Contact with Patient 09/21/20 [phone]     (approximate)  I have reviewed the triage vital signs and the nursing notes.   HISTORY  Chief Complaint Back Pain    HPI [name] is a 70 y.o. male with history of CAD, tobacco use, hyperlipidemia who is left-hand dominant who presents to the emergency department with left upper arm pain that radiates into the left upper back that started 5 days ago.  Describes it as constant and aching like a "toothache".  He denies any chest pain, chest discomfort, shortness of breath.  No fevers, cough, vomiting or diarrhea.  No numbness, tingling or weakness.  No bowel or bladder incontinence.  No urinary retention.  Taking over-the-counter medications at home without any relief.  No aggravating or alleviating factors.  Pain is not worse with movement of the arm.  Denies any known injury.        Past Medical History:  Diagnosis Date  . Coronary artery disease    a. 04/2018 NSTEMI/Cath: LM nl, LAD 50p, 53m/d diffuse-small caliber, LCX heavily Ca2+ sev prox/mid dzs, RCA 90 diff distal dzs into RPL and RPDA. EF 50-55%-->Med Rx.  [name] History of echocardiogram    a. 04/2018 Echo: EF 55-60%, no rwma, mild MR, mildly dil LA. Nl RV fxn.  . Hyperlipidemia   . NSTEMI (non-ST elevated myocardial infarction) ([place]) 05/15/2018  . Tobacco abuse    a. Quit 04/2018.    Patient Active Problem List   Diagnosis Date Noted  . NSTEMI (non-ST elevated myocardial infarction) ([place]) 05/15/2018    Past Surgical History:  Procedure Laterality Date  . BRAIN SURGERY    . CARDIAC CATHETERIZATION    . LEFT HEART CATH AND CORONARY ANGIOGRAPHY N/A 05/16/2018   Procedure: LEFT HEART CATH AND CORONARY ANGIOGRAPHY poss PCI;  Surgeon: [name], MD;  Location: [place] [facility];  Service: Cardiovascular;  Laterality:  N/A;    Prior to Admission medications   Medication Sig Start Date End Date Taking? Authorizing Provider  aspirin EC 81 MG EC tablet Take 1 tablet (81 mg total) by mouth daily. 05/17/18   Hillary Bow, MD  atorvastatin (LIPITOR) 80 MG tablet Take 1 tablet (80 mg total) by mouth daily at 6 PM. 10/12/19   Gollan, Kathlene November, MD  clopidogrel (PLAVIX) 75 MG tablet Take 1 tablet (75 mg total) by mouth daily. 10/12/19   [name], MD  dicyclomine (BENTYL) 10 MG capsule Take 1 capsule (10 mg total) by mouth 3 (three) times daily as needed for up to 14 days for spasms. 09/17/20 10/01/20  Harvest Dark, MD  ezetimibe (ZETIA) 10 MG tablet Take 1 tablet (10 mg total) by mouth daily. 10/12/19 01/10/20  [name], MD  icosapent Ethyl (VASCEPA) 1 g capsule Take 2 capsules (2 g total) by mouth 2 (two) times daily. 10/26/19 11/25/19  [name], MD  isosorbide mononitrate (IMDUR) 30 MG 24 hr tablet Take 1 tablet (30 mg total) by mouth daily. 10/12/19   [name], MD  losartan (COZAAR) 25 MG tablet Take 1 tablet by mouth once daily 02/03/20   [name], MD  nitroGLYCERIN (NITROSTAT) 0.4 MG SL tablet Place 1 tablet (0.4 mg total) under the tongue every 5 (five) minutes as needed for chest pain. 06/05/18 07/09/28  Theora Gianotti, NP    Allergies Patient has no  known allergies.  Family History  Problem Relation Age of Onset  . Heart Problems Mother   . Heart attack Father   . Diabetes Brother   . Heart Problems Brother     Social History Social History   Tobacco Use  . Smoking status: Former Smoker    Packs/day: 1.00    Years: 45.00    Pack years: 45.00    Types: Cigarettes    Quit date: 05/15/2018    Years since quitting: 2.3  . Smokeless tobacco: Never Used  Vaping Use  . Vaping Use: Never used  Substance Use Topics  . Alcohol use: Never  . Drug use: Never    Review of Systems Constitutional: No fever. Eyes: No visual changes. ENT: No sore  throat. Cardiovascular: Denies chest pain. Respiratory: Denies shortness of breath. Gastrointestinal: No nausea, vomiting, diarrhea. Genitourinary: Negative for dysuria. Musculoskeletal: Negative for back pain. Skin: Negative for rash. Neurological: Negative for focal weakness or numbness.  ____________________________________________   PHYSICAL EXAM:  VITAL SIGNS: ED Triage Vitals  Enc Vitals Group     BP 09/21/20 0541 137/83     Pulse Rate 09/21/20 0541 97     Resp 09/21/20 0541 18     Temp 09/21/20 0541 98 F (36.7 C)     Temp src --      SpO2 09/21/20 0541 100 %     Weight 09/21/20 0540 160 lb (72.6 kg)     Height 09/21/20 0540 5\' 4"  (1.626 m)     Head Circumference --      Peak Flow --      Pain Score 09/21/20 0540 10     Pain Loc --      Pain Edu? --      Excl. in [place]? --    CONSTITUTIONAL: Alert and oriented and responds appropriately to questions. Well-appearing; well-nourished, elderly, in no distress HEAD: Normocephalic EYES: Conjunctivae clear, pupils appear equal, EOM appear intact ENT: normal nose; moist mucous membranes NECK: Supple, normal ROM, no midline spinal tenderness or step-off or deformity CARD: RRR; S1 and S2 appreciated; no murmurs, no clicks, no rubs, no gallops RESP: Normal chest excursion without splinting or tachypnea; breath sounds clear and equal bilaterally; no wheezes, no rhonchi, no rales, no hypoxia or respiratory distress, speaking full sentences ABD/GI: Normal bowel sounds; non-distended; soft, non-tender, no rebound, no guarding, no peritoneal signs, no hepatosplenomegaly BACK: The back appears normal, no midline spinal tenderness or step-off or deformity, slightly tight left trapezius muscle that is nontender to palpation without associated redness, warmth, ecchymosis or soft tissue swelling EXT: Normal ROM in all joints; no deformity noted, no edema; no cyanosis, no calf tenderness or calf swelling, patient has no tenderness to  palpation over the left upper extremity, compartments are soft, 2+ radial pulses bilaterally, full range of motion of all joints of the left arm that does not reproduce pain SKIN: Normal color for age and race; warm; no rash on exposed skin NEURO: Moves all extremities equally, normal sensation diffusely, normal gait, normal speech, 2+ deep tendon reflexes in bilateral lower extremities, no saddle anesthesia, no clonus PSYCH: The patient's mood and manner are appropriate.  ____________________________________________   LABS (all labs ordered are listed, but only abnormal results are displayed)  Labs Reviewed  CBC WITH DIFFERENTIAL/PLATELET - Abnormal; Notable for the following components:      Result Value   WBC 12.3 (*)    RBC 3.88 (*)    Hemoglobin 11.2 (*)  HCT 32.9 (*)    Neutro Abs 9.5 (*)    Abs Immature Granulocytes 0.14 (*)    All other components within normal limits  BASIC METABOLIC PANEL  TROPONIN I (HIGH SENSITIVITY)   ____________________________________________  EKG   EKG Interpretation  Date/Time:  Wednesday Sep 21 2020 06:31:30 EDT Ventricular Rate:  95 PR Interval:  139 QRS Duration: 90 QT Interval:  343 QTC Calculation: 432 R Axis:   -11 Text Interpretation: Sinus rhythm Borderline low voltage, extremity leads Confirmed by Pryor Curia [phone]) on 09/21/2020 7:06:57 AM       ____________________________________________  RADIOLOGY Jessie Foot Jayce Boyko, personally viewed and evaluated these images (plain radiographs) as part of my medical decision making, as well as reviewing the written report by the radiologist.  ED MD interpretation: Left shoulder x-ray shows no acute abnormality.  Chest x-ray shows a left upper lobe mass.  Official radiology report(s): DG Chest 2 View  Result Date: 09/21/2020 CLINICAL DATA:  Left arm pain. EXAM: CHEST - 2 VIEW COMPARISON:  Chest CT 02/10/2019.  Chest x-ray 05/15/2018. FINDINGS: A prominent masslike density is noted  in the left apex in the region of previously identified bronchiectasis and scarring. Malignancy cannot be excluded. Contrast-enhanced chest CT should be considered for further evaluation. No pleural effusion or pneumothorax. Heart size normal. No acute bony abnormality. IMPRESSION: A prominent masslike density is noted in the left apex in the region of previously identified bronchiectasis and scarring. A malignancy cannot be excluded. Contrast-enhanced chest CT should be considered for further evaluation. These results were called by telephone at the time of interpretation on 09/21/2020 at 6:31 am to provider [facility] , who verbally acknowledged these results. Electronically Signed   By: Marcello Moores  Register   On: 09/21/2020 06:32   DG Shoulder Left  Result Date: 09/21/2020 CLINICAL DATA:  Left arm pain. EXAM: LEFT SHOULDER - 2+ VIEW COMPARISON:  CT chest 05/15/2018 FINDINGS: There is no evidence of fracture or dislocation. Degenerative changes of the acromioclavicular joint. Likely enthesopathy along the inferior distal clavicle. Soft tissues are unremarkable. IMPRESSION: No acute displaced fracture or dislocation. Electronically Signed   By: Iven Finn M.D.   On: 09/21/2020 06:31    ____________________________________________   PROCEDURES  Procedure(s) performed (including Critical Care):  Procedures   ____________________________________________   INITIAL IMPRESSION / ASSESSMENT AND PLAN / ED COURSE  As part of my medical decision making, I reviewed the following data within the [place] notes reviewed and incorporated, Labs reviewed , EKG interpreted , Old EKG reviewed, Old chart reviewed, Patient signed out to oncoming EDP, Radiograph reviewed  and Notes from prior ED visits         Patient here with left upper extremity, left upper back pain for the past several days.  Describes it as aching like a toothache.  It is not reproducible with palpation or  movement of the left upper extremity.  This could be arthritis, rotator cuff tendinitis or injury.  Low suspicion for fracture or dislocation but given patient is elderly, will obtain x-rays.  This also could be his anginal equivalent.  His EKG is nonischemic but will obtain cardiac labs, chest x-ray.  He denies any chest pain or shortness of breath.  No signs of septic arthritis, cellulitis, abscess, compartment syndrome, arterial obstruction, DVT on exam.  Will give Toradol for pain as he drove himself to the emergency department.  Review of recent blood work shows that he has a normal renal function.  ED PROGRESS  Patient's chest x-ray concerning for a left apical mass.  We will proceed with CT with IV contrast.  He states he was told he had a small mass in this area but states that it was very small.  This could be the cause of his pain.  Signed out to oncoming ED physician to follow-up on patient's labs, CT imaging.  I reviewed all nursing notes and pertinent previous records as available.  I have reviewed and interpreted any EKGs, lab and urine results, imaging (as available).  ____________________________________________   FINAL CLINICAL IMPRESSION(S) / ED DIAGNOSES  Final diagnoses:  Left arm pain     ED Discharge Orders    None      *Please note:  Donald Lowery was evaluated in Emergency Department on 09/21/2020 for the symptoms described in the history of present illness. He was evaluated in the context of the global COVID-19 pandemic, which necessitated consideration that the patient might be at risk for infection with the SARS-CoV-2 virus that causes COVID-19. Institutional protocols and algorithms that pertain to the evaluation of patients at risk for COVID-19 are in a state of rapid change based on information released by regulatory bodies including the CDC and federal and state organizations. These policies and algorithms were followed during the patient's care in the ED.   Some ED evaluations and interventions may be delayed as a result of limited staffing during and the pandemic.*   Note:  This document was prepared using Dragon voice recognition software and may include unintentional dictation errors.   Cindi Ghazarian, Delice Bison, DO 09/21/20 [phone]

## 2020-09-21 NOTE — ED Provider Notes (Signed)
  Patient received in signout from Dr. Leonides Schanz pending CT chest to evaluate left upper lobe mass.  CT reviewed with 7 cm mass with some local compression.  Work-up otherwise benign without evidence of ACS.  I assessed the patient and he reports controlled pain with single dose of IV morphine, and reports his wife is on the way to come pick him up.  Continues to have normal vital signs.  We discussed CT results, likely representing cancer.  We discussed analgesia at home, following up with palliative care and oncology.  Answered questions.  Return precautions for the ED were discussed prior to discharge.   Donald Crofts, MD 09/21/20 [phone]

## 2020-09-21 NOTE — ED Notes (Signed)
Pt already in CT. Will administer ordered morphine when pt returns.

## 2020-09-21 NOTE — ED Notes (Signed)
Patient out of room in imaging.

## 2020-09-21 NOTE — ED Triage Notes (Signed)
Pt states he has had left sided back pain since Friday. Pt states the pain goes from his shoulder to his lower back. Pt denies any injury to back. Pt states pain is throbbing.

## 2020-09-22 ENCOUNTER — Other Ambulatory Visit: Admission: RE | Admit: 2020-09-22 | Payer: Medicare HMO | Source: Ambulatory Visit

## 2020-09-27 ENCOUNTER — Encounter: Payer: Self-pay | Admitting: Oncology

## 2020-09-27 ENCOUNTER — Other Ambulatory Visit: Payer: Self-pay

## 2020-09-27 ENCOUNTER — Telehealth: Payer: Self-pay | Admitting: Cardiovascular Disease

## 2020-09-27 ENCOUNTER — Inpatient Hospital Stay: Payer: Medicare HMO

## 2020-09-27 ENCOUNTER — Inpatient Hospital Stay: Payer: Medicare HMO | Attending: Oncology | Admitting: Oncology

## 2020-09-27 ENCOUNTER — Encounter: Payer: Self-pay | Admitting: *Deleted

## 2020-09-27 VITALS — BP 108/62 | HR 105 | Temp 98.3°F | Resp 18 | Ht 64.0 in | Wt 146.1 lb

## 2020-09-27 DIAGNOSIS — R918 Other nonspecific abnormal finding of lung field: Secondary | ICD-10-CM

## 2020-09-27 MED ORDER — ATORVASTATIN CALCIUM 80 MG PO TABS: 80.0000 mg | ORAL_TABLET | Freq: Every day | ORAL | 0 refills | Status: DC

## 2020-09-27 NOTE — Telephone Encounter (Signed)
*  STAT* If patient is at the pharmacy, call can be transferred to refill team.   1. Which medications need to be refilled? (please list name of each medication and dose if known)  Atorvastatin 80 MG 1 tablet daily   2. Which pharmacy/location (including street and city if local pharmacy) is medication to be sent to? [facility] in [place]  3. Do they need a 30 day or 90 day supply? 90 day

## 2020-09-27 NOTE — Telephone Encounter (Signed)
Requested Prescriptions   Signed Prescriptions Disp Refills   atorvastatin (LIPITOR) 80 MG tablet 90 tablet 0    Sig: Take 1 tablet (80 mg total) by mouth daily at 6 PM.    Authorizing Provider: [name]    Ordering User: Raelene Bott, Erlin Gardella L

## 2020-09-28 ENCOUNTER — Ambulatory Visit: Admission: RE | Admit: 2020-09-28 | Payer: Medicare HMO | Source: Home / Self Care | Admitting: General Surgery

## 2020-09-28 ENCOUNTER — Encounter: Admission: RE | Payer: Self-pay | Source: Home / Self Care

## 2020-09-28 ENCOUNTER — Other Ambulatory Visit: Payer: Self-pay | Admitting: *Deleted

## 2020-09-28 DIAGNOSIS — R918 Other nonspecific abnormal finding of lung field: Secondary | ICD-10-CM

## 2020-09-28 SURGERY — CHOLECYSTECTOMY, ROBOT-ASSISTED, LAPAROSCOPIC
Anesthesia: General | Site: Abdomen

## 2020-09-28 MED ORDER — OXYCODONE HCL 10 MG PO TABS: 10.0000 mg | ORAL_TABLET | Freq: Four times a day (QID) | ORAL | 0 refills | Status: DC | PRN

## 2020-09-28 NOTE — Progress Notes (Signed)
Per Dr. Rockey Situ, send letter stating  Okay to stop Plavix 5 days prior to lung biopsy  Stay on aspirin 81

## 2020-09-28 NOTE — Progress Notes (Signed)
[place]  Telephone:([phone]) [phone] [phone]) [phone]  ID: Donald Lowery OB: [birth year]  MR#: [id number]  EXN#:[id number]  Patient Care Team: [name], NP as PCP - General (Internal Medicine) [name], MD as PCP - Cardiology (Cardiology)  CHIEF COMPLAINT: Left upper lobe lung mass.  INTERVAL HISTORY: Patient is a 70 year old male who initially presented to the emergency room with 4 to 6 weeks of left shoulder pain.  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  He continues to have pain, but otherwise feels well.  He has no neurologic complaints.  He denies any recent fevers or illnesses.  He has a good appetite and denies weight loss.  He denies any chest pain, shortness of breath, cough, or hemoptysis.  He recently had some abdominal pain, but this resolved.  He has no nausea, vomiting, constipation, or diarrhea.  He has no urinary complaints.  Patient otherwise feels well and offers no further specific complaints today.  REVIEW OF SYSTEMS:   Review of Systems  Constitutional: Negative.  Negative for fever, malaise/fatigue and weight loss.  Respiratory: Negative.  Negative for cough, hemoptysis and shortness of breath.   Cardiovascular: Negative.  Negative for chest pain and leg swelling.  Gastrointestinal: Negative for abdominal pain.  Genitourinary: Negative.  Negative for dysuria.  Musculoskeletal: Positive for joint pain.  Skin: Negative.  Negative for rash.  Neurological: Negative.  Negative for dizziness, focal weakness, weakness and headaches.    As per HPI. Otherwise, a complete review of systems is negative.  PAST MEDICAL HISTORY: Past Medical History:  Diagnosis Date  . Coronary artery disease    a. 04/2018 NSTEMI/Cath: LM nl, LAD 50p, 86m/d diffuse-small caliber, LCX heavily Ca2+ sev prox/mid dzs, RCA 90 diff distal dzs into RPL and RPDA. EF 50-55%-->Med Rx.  [name] History of  echocardiogram    a. 04/2018 Echo: EF 55-60%, no rwma, mild MR, mildly dil LA. Nl RV fxn.  . Hyperlipidemia   . NSTEMI (non-ST elevated myocardial infarction) ([place]) 05/15/2018  . Tobacco abuse    a. Quit 04/2018.    PAST SURGICAL HISTORY: Past Surgical History:  Procedure Laterality Date  . BRAIN SURGERY    . CARDIAC CATHETERIZATION    . LEFT HEART CATH AND CORONARY ANGIOGRAPHY N/A 05/16/2018   Procedure: LEFT HEART CATH AND CORONARY ANGIOGRAPHY poss PCI;  Surgeon: [name], MD;  Location: [facility];  Service: Cardiovascular;  Laterality: N/A;    FAMILY HISTORY: Family History  Problem Relation Age of Onset  . Heart Problems Mother   . Heart attack Father   . Diabetes Brother   . Heart Problems Brother     ADVANCED DIRECTIVES (Y/N):  N  HEALTH MAINTENANCE: Social History   Tobacco Use  . Smoking status: Former Smoker    Packs/day: 1.00    Years: 45.00    Pack years: 45.00    Types: Cigarettes    Quit date: 05/15/2018    Years since quitting: 2.3  . Smokeless tobacco: Never Used  Vaping Use  . Vaping Use: Never used  Substance Use Topics  . Alcohol use: Never  . Drug use: Never     Colonoscopy:  PAP:  Bone density:  Lipid panel:  No Known Allergies  Current Outpatient Medications  Medication Sig Dispense Refill  . aspirin EC 81 MG EC tablet Take 1 tablet (81 mg total) by mouth daily. 30 tablet 0  .  clopidogrel (PLAVIX) 75 MG tablet Take 1 tablet (75 mg total) by mouth daily. 90 tablet 3  . dicyclomine (BENTYL) 10 MG capsule Take 1 capsule (10 mg total) by mouth 3 (three) times daily as needed for up to 14 days for spasms. 20 capsule 0  . isosorbide mononitrate (IMDUR) 30 MG 24 hr tablet Take 1 tablet (30 mg total) by mouth daily. 90 tablet 3  . Menthol-Methyl Salicylate (ICY HOT) 03-54 % STCK Apply 1 application topically daily as needed (shoulder pain).    . metFORMIN (GLUCOPHAGE) 500 MG tablet Take 500 mg by mouth every morning.    .  nitroGLYCERIN (NITROSTAT) 0.4 MG SL tablet Place 1 tablet (0.4 mg total) under the tongue every 5 (five) minutes as needed for chest pain. 25 tablet 3  . oxyCODONE (ROXICODONE) 5 MG immediate release tablet Take 1 tablet (5 mg total) by mouth every 6 (six) hours as needed for severe pain. 20 tablet 0  . vitamin B-12 (CYANOCOBALAMIN) 1000 MCG tablet Take 1,000 mcg by mouth daily.    [name] atorvastatin (LIPITOR) 80 MG tablet Take 1 tablet (80 mg total) by mouth daily at 6 PM. 90 tablet 0  . ezetimibe (ZETIA) 10 MG tablet Take 1 tablet (10 mg total) by mouth daily. 90 tablet 3  . icosapent Ethyl (VASCEPA) 1 g capsule Take 2 capsules (2 g total) by mouth 2 (two) times daily. 120 capsule 11  . losartan (COZAAR) 25 MG tablet Take 1 tablet by mouth once daily (Patient taking differently: Take 25 mg by mouth daily.) 90 tablet 2   No current facility-administered medications for this visit.    OBJECTIVE: Vitals:   09/27/20 1359  BP: 108/62  Pulse: (!) 105  Resp: 18  Temp: 98.3 F (36.8 C)  SpO2: 100%     Body mass index is 25.08 kg/m.    ECOG FS:1 - Symptomatic but completely ambulatory  General: Well-developed, well-nourished, no acute distress. Eyes: Pink conjunctiva, anicteric sclera. HEENT: Normocephalic, moist mucous membranes. Lungs: No audible wheezing or coughing. Heart: Regular rate and rhythm. Abdomen: Soft, nontender, no obvious distention. Musculoskeletal: No edema, cyanosis, or clubbing. Neuro: Alert, answering all questions appropriately. Cranial nerves grossly intact. Skin: No rashes or petechiae noted. Psych: Normal affect. Lymphatics: No cervical, calvicular, axillary or inguinal LAD.   LAB RESULTS:  Lab Results  Component Value Date   NA 130 (L) 09/21/2020   K 4.2 09/21/2020   CL 99 09/21/2020   CO2 23 09/21/2020   GLUCOSE 160 (H) 09/21/2020   BUN 13 09/21/2020   CREATININE 0.96 09/21/2020   CALCIUM 9.5 09/21/2020   PROT 7.0 09/16/2020   ALBUMIN 3.5 09/16/2020    AST 18 09/16/2020   ALT 14 09/16/2020   ALKPHOS 96 09/16/2020   BILITOT 0.8 09/16/2020   GFRNONAA >60 09/21/2020   GFRAA >60 07/14/2019    Lab Results  Component Value Date   WBC 12.3 (H) 09/21/2020   NEUTROABS 9.5 (H) 09/21/2020   HGB 11.2 (L) 09/21/2020   HCT 32.9 (L) 09/21/2020   MCV 84.8 09/21/2020   PLT 329 09/21/2020     STUDIES: CT ABDOMEN PELVIS WO CONTRAST  Result Date: 09/17/2020 CLINICAL DATA:  Nonlocalized acute abdominal pain. Epigastric that radiates to back. Burning since last night. EXAM: CT ABDOMEN AND PELVIS WITHOUT CONTRAST TECHNIQUE: Multidetector CT imaging of the abdomen and pelvis was performed following the standard protocol without IV contrast. COMPARISON:  Chest 02/10/2019 FINDINGS: Lower chest: Interval development of a 7 mm  right middle lobe pulmonary nodule (3:5). Bilateral lower lobe subsegmental atelectasis. Coronary artery calcifications. Hepatobiliary: No focal liver abnormality. Several subcentimeter calcified gallstones are noted within the gallbladder lumen. Slightly hazy gallbladder wall with question of gallbladder wall thickening. No definite pericholecystic fluid. No biliary dilatation. Pancreas: No focal lesion. Normal pancreatic contour. No surrounding inflammatory changes. No main pancreatic ductal dilatation. Spleen: Normal in size without focal abnormality. Adrenals/Urinary Tract: No adrenal nodule bilaterally. No nephrolithiasis, no hydronephrosis, and no contour-deforming renal mass. No ureterolithiasis or hydroureter. The urinary bladder is unremarkable. Stomach/Bowel: Stomach is within normal limits. No evidence of bowel wall thickening or dilatation. Scattered descending colon diverticulosis. Likely peristalsis of the mid transverse colon. Appendix appears normal. Vascular/Lymphatic: No abdominal aorta or iliac aneurysm. Severe atherosclerotic plaque of the aorta and its branches. No abdominal, pelvic, or inguinal lymphadenopathy. Reproductive:  Prominent prostate gland. Other: No intraperitoneal free fluid. No intraperitoneal free gas. No organized fluid collection. Musculoskeletal: No abdominal wall hernia or abnormality. No suspicious lytic or blastic osseous lesions. No acute displaced fracture. Multilevel mild degenerative changes of the spine. IMPRESSION: 1. Cholelithiasis with gallbladder wall haziness and question of gallbladder wall thickening. Recommend right upper quadrant ultrasound for a more sensitive evaluation of the gallbladder. 2. Interval development of a 7 mm right middle lobe pulmonary nodule. If no history of malignancy, please follow/a criteria: Non-contrast chest CT at 6-12 months is recommended. If the nodule is stable at time of repeat CT, then future CT at 18-24 months (from today's scan) is considered optional for low-risk patients, but is recommended for high-risk patients. This recommendation follows the consensus statement: Guidelines for Management of Incidental Pulmonary Nodules Detected on CT Images: From the Fleischner Society 2017; Radiology 2017; 284:228-243. 3. Prominent prostate. 4. Scattered colonic diverticulosis with no acute diverticulitis. 5. Aortic Atherosclerosis (ICD10-I70.0) - severe. Electronically Signed   By: Iven Finn M.D.   On: 09/17/2020 01:28   DG Chest 2 View  Result Date: 09/21/2020 CLINICAL DATA:  Left arm pain. EXAM: CHEST - 2 VIEW COMPARISON:  Chest CT 02/10/2019.  Chest x-ray 05/15/2018. FINDINGS: A prominent masslike density is noted in the left apex in the region of previously identified bronchiectasis and scarring. Malignancy cannot be excluded. Contrast-enhanced chest CT should be considered for further evaluation. No pleural effusion or pneumothorax. Heart size normal. No acute bony abnormality. IMPRESSION: A prominent masslike density is noted in the left apex in the region of previously identified bronchiectasis and scarring. A malignancy cannot be excluded. Contrast-enhanced  chest CT should be considered for further evaluation. These results were called by telephone at the time of interpretation on 09/21/2020 at 6:31 am to provider [facility] , who verbally acknowledged these results. Electronically Signed   By: Marcello Moores  Register   On: 09/21/2020 06:32   CT Chest W Contrast  Result Date: 09/21/2020 CLINICAL DATA:  Abnormal x-ray.  Cancer of unknown primary EXAM: CT CHEST WITH CONTRAST TECHNIQUE: Multidetector CT imaging of the chest was performed during intravenous contrast administration. CONTRAST:  47mL OMNIPAQUE IOHEXOL 300 MG/ML  SOLN COMPARISON:  Chest x-ray from earlier today and chest CT 02/10/2019 FINDINGS: Cardiovascular: Normal heart size. Trace low-density pericardial fluid anteriorly. Aortic and coronary atherosclerosis. Left subclavian and vertebral encasement described below. Mediastinum/Nodes: A left apical tumor invades the mediastinum to the level of contacting the left aspects of the esophagus and trachea and encasing the left subclavian artery and vertebral origin. No visible fat plane between the mass and T3 vertebra but also no bony erosion  seen. No contralateral adenopathy noted. Lungs/Pleura: 7 cm left upper lobe mass with left mediastinal/subpleural invasion noted above. Bronchiectatic scar-like appearance is seen more laterally. New pulmonary nodules scattered in the right lung, most likely metastatic. The largest measures 8 mm at the superior segment right lower lobe. No edema, effusion, or pneumothorax. Paraseptal emphysema Upper Abdomen: Cholelithiasis. Incomplete distension of the gallbladder. No visible upper abdominal metastasis. Fatty Bochdalek's hernia. Musculoskeletal: No acute or aggressive finding. IMPRESSION: 1. 7 cm left upper lobe mass with mediastinal invasion and left subclavian/proximal vertebral encasement. The mass contacts the esophagus, trachea, and thoracic spine without visible invasion of these structures. 2. Subcentimeter pulmonary  nodules that are new from 2020 and most likely pulmonary metastases. Electronically Signed   By: Monte Fantasia M.D.   On: 09/21/2020 08:05   DG Shoulder Left  Result Date: 09/21/2020 CLINICAL DATA:  Left arm pain. EXAM: LEFT SHOULDER - 2+ VIEW COMPARISON:  CT chest 05/15/2018 FINDINGS: There is no evidence of fracture or dislocation. Degenerative changes of the acromioclavicular joint. Likely enthesopathy along the inferior distal clavicle. Soft tissues are unremarkable. IMPRESSION: No acute displaced fracture or dislocation. Electronically Signed   By: Iven Finn M.D.   On: 09/21/2020 06:31   US ABDOMEN LIMITED RUQ (LIVER/GB)  Result Date: 09/17/2020 CLINICAL DATA:  Abdominal pain. EXAM: ULTRASOUND ABDOMEN LIMITED RIGHT UPPER QUADRANT COMPARISON:  CT earlier today. FINDINGS: Gallbladder: Contracted gallbladder containing multiple stones. Wall thickness at 2-3 mm. Multiple foci of ring down artifact typical of adenomyomatosis. No pericholecystic fluid. No sonographic Murphy sign noted by sonographer. Common bile duct: Diameter: 3 mm, limited visualization. Liver: No focal lesion identified. Within normal limits in parenchymal echogenicity. Portal vein is patent on color Doppler imaging with normal direction of blood flow towards the liver. Other: No right upper quadrant ascites. IMPRESSION: 1. Contracted gallbladder containing multiple gallstones. Wall thickness of 2-3 mm of multiple foci of ring down artifact suggesting diffuse adenomyomatosis. No sonographic Murphy sign or pericholecystic fluid. If there is clinical concern for acute cholecystitis, consider nuclear medicine HIDA scan. 2. No biliary dilatation. Electronically Signed   By: Keith Rake M.D.   On: 09/17/2020 03:02    ASSESSMENT: Left upper lobe lung mass.  PLAN:    1. Left upper lobe lung mass: CT scan results from Sep 21, 2020 reviewed independently and reported as above.  Highly suspicious for underlying malignancy.   Patient will require a PET scan as well as MRI to complete the staging work-up.  I have also ordered a CT-guided biopsy to confirm diagnosis.  Patient will return to clinic several days after his biopsy to discuss the results and treatment planning.  He will also have consultation with radiation oncology at that time. 2.  Shoulder pain: Patient was given a prescription for oxycodone today.  He will also have consultation with radiation oncology in the near future.  I spent a total of 60 minutes reviewing chart data, face-to-face evaluation with the patient, counseling and coordination of care as detailed above.   Patient expressed understanding and was in agreement with this plan. He also understands that He can call clinic at any time with any questions, concerns, or complaints.   Cancer Staging No matching staging information was found for the patient.  Lloyd Huger, MD   09/28/2020 2:34 PM

## 2020-10-04 ENCOUNTER — Ambulatory Visit
Admission: RE | Admit: 2020-10-04 | Discharge: 2020-10-04 | Disposition: A | Discharge status: Home / Self Care | Payer: Medicare HMO | Source: Ambulatory Visit | Attending: Oncology | Admitting: Oncology

## 2020-10-04 ENCOUNTER — Other Ambulatory Visit: Payer: Self-pay

## 2020-10-04 DIAGNOSIS — K8020 Calculus of gallbladder without cholecystitis without obstruction: Secondary | ICD-10-CM | POA: Diagnosis not present

## 2020-10-04 DIAGNOSIS — J439 Emphysema, unspecified: Secondary | ICD-10-CM | POA: Insufficient documentation

## 2020-10-04 DIAGNOSIS — R918 Other nonspecific abnormal finding of lung field: Secondary | ICD-10-CM | POA: Diagnosis not present

## 2020-10-04 DIAGNOSIS — I700 Atherosclerosis of aorta: Secondary | ICD-10-CM | POA: Insufficient documentation

## 2020-10-04 LAB — GLUCOSE, CAPILLARY: Glucose-Capillary: 127 mg/dL — ABNORMAL HIGH (ref 70–99)

## 2020-10-04 MED ORDER — FLUDEOXYGLUCOSE F - 18 (FDG) INJECTION
7.6000 | Freq: Once | INTRAVENOUS | Status: AC | PRN
  Administered 2020-10-04: 7.94 via INTRAVENOUS

## 2020-10-05 ENCOUNTER — Other Ambulatory Visit: Payer: Self-pay | Admitting: *Deleted

## 2020-10-05 DIAGNOSIS — E785 Hyperlipidemia, unspecified: Secondary | ICD-10-CM | POA: Insufficient documentation

## 2020-10-05 DIAGNOSIS — I10 Essential (primary) hypertension: Secondary | ICD-10-CM | POA: Insufficient documentation

## 2020-10-05 DIAGNOSIS — R918 Other nonspecific abnormal finding of lung field: Secondary | ICD-10-CM

## 2020-10-06 ENCOUNTER — Telehealth: Payer: Self-pay | Admitting: Cardiovascular Disease

## 2020-10-06 MED ORDER — EZETIMIBE 10 MG PO TABS: 10.0000 mg | ORAL_TABLET | Freq: Every day | ORAL | 0 refills | Status: DC

## 2020-10-06 NOTE — Telephone Encounter (Signed)
*  STAT* If patient is at the pharmacy, call can be transferred to refill team.   1. Which medications need to be refilled? (please list name of each medication and dose if known) **Ezetimibe 10 mg po q d   2. Which pharmacy/location (including street and city if local pharmacy) is medication to be sent to?[facility] [place]   3. Do they need a 30 day or 90 day supply? [place]

## 2020-10-07 ENCOUNTER — Telehealth: Payer: Self-pay | Admitting: Pulmonary Disease

## 2020-10-07 ENCOUNTER — Encounter: Payer: Self-pay | Admitting: Pulmonary Disease

## 2020-10-07 ENCOUNTER — Other Ambulatory Visit: Payer: Self-pay

## 2020-10-07 ENCOUNTER — Ambulatory Visit (INDEPENDENT_AMBULATORY_CARE_PROVIDER_SITE_OTHER): Payer: Medicare HMO | Admitting: Pulmonary Disease

## 2020-10-07 VITALS — BP 120/74 | HR 83 | Temp 98.0°F | Ht 64.0 in | Wt 141.8 lb

## 2020-10-07 DIAGNOSIS — M79622 Pain in left upper arm: Secondary | ICD-10-CM | POA: Diagnosis not present

## 2020-10-07 DIAGNOSIS — C3412 Malignant neoplasm of upper lobe, left bronchus or lung: Secondary | ICD-10-CM | POA: Diagnosis not present

## 2020-10-07 DIAGNOSIS — J439 Emphysema, unspecified: Secondary | ICD-10-CM

## 2020-10-07 DIAGNOSIS — R499 Unspecified voice and resonance disorder: Secondary | ICD-10-CM | POA: Diagnosis not present

## 2020-10-07 MED ORDER — GABAPENTIN 100 MG PO CAPS: 100.0000 mg | ORAL_CAPSULE | Freq: Three times a day (TID) | ORAL | 2 refills | Status: DC

## 2020-10-07 NOTE — Telephone Encounter (Signed)
Robotic bronchoscopy with navigation and cellvizio.  [id number], [id number] ID:[id number]  Rodena Piety, please see bronch info.

## 2020-10-07 NOTE — Progress Notes (Signed)
Subjective:    Patient ID: Donald Lowery, male    DOB: 1951/02/12, 70 y.o.   MRN: [id number] Chief Complaint  Patient presents with   pulmonary consult    PET 10/05/2020. Prod cough with white sputum.    HPI Patient is a 70 year old former smoker with a 45-pack-year history of smoking, who presents for evaluation of a left upper lobe mass.  He is kindly referred by Donald Lowery.  The patient presents with his wife who is present during the visit.  The patient initially presented to the emergency room with a history of 4 to 6 weeks of left shoulder pain.  Was evaluated on May 25 and a CT scan was obtained at that time as x-ray showed density on the left upper lobe.  The CT scan confirmed a 7 cm left upper lobe mass with left mediastinal and subpleural invasion as well as invasion into the trachea, esophagus and left subclavian artery.  Underwent PET/CT which confirmed hypermetabolism on the left apical mass as well as several hypermetabolic pulmonary nodules bilaterally consistent with metastases.  The patient notes that his shoulder pain became very sharp and excruciating around May 15.  He has not noted any cough.  He has noted however that he has developed hiccups and that his voice has changed in character becoming deeper.  He has also noted weight loss of approximately 25 pounds over the last month.  His appetite remains good.  Over the last several weeks however he has had to sleep sitting in a recliner with a heating pad due to the pain on his shoulder.  He is on oxycodone but this does not relieve the pain consistently or sustainably.  He is on Plavix for a history of coronary artery disease with prior stents.  Stents were placed remotely.  We discussed diagnostic modalities and that bronchoscopy with navigational assistance would be the preferred method at present.  The patient is agreeable to proceed.  The patient was shown all of his films  Review of Systems A 10 point review of  systems was performed and it is as noted above otherwise negative.  Past Medical History:  Diagnosis Date   Coronary artery disease    a. 04/2018 NSTEMI/Cath: LM nl, LAD 50p, 54m/d diffuse-small caliber, LCX heavily Ca2+ sev prox/mid dzs, RCA 90 diff distal dzs into RPL and RPDA. EF 50-55%-->Med Rx.   History of echocardiogram    a. 04/2018 Echo: EF 55-60%, no rwma, mild MR, mildly dil LA. Nl RV fxn.   Hyperlipidemia    NSTEMI (non-ST elevated myocardial infarction) ([place]) 05/15/2018   Tobacco abuse    a. Quit 04/2018.   Past Surgical History:  Procedure Laterality Date   BRAIN SURGERY     CARDIAC CATHETERIZATION     LEFT HEART CATH AND CORONARY ANGIOGRAPHY N/A 05/16/2018   Procedure: LEFT HEART CATH AND CORONARY ANGIOGRAPHY poss PCI;  Surgeon: [name], MD;  Location: [facility];  Service: Cardiovascular;  Laterality: N/A;   Family History  Problem Relation Age of Onset   Heart Problems Mother    Heart attack Father    Diabetes Brother    Heart Problems Brother    Social History   Tobacco Use   Smoking status: Former    Packs/day: 1.00    Years: 45.00    Pack years: 45.00    Types: Cigarettes    Quit date: 05/15/2018    Years since quitting: 2.4   Smokeless tobacco:  Never  Substance Use Topics   Alcohol use: Never   No Known Allergies  Current Meds  Medication Sig   aspirin EC 81 MG EC tablet Take 1 tablet (81 mg total) by mouth daily.   atorvastatin (LIPITOR) 80 MG tablet Take 1 tablet (80 mg total) by mouth daily at 6 PM.   clopidogrel (PLAVIX) 75 MG tablet Take 1 tablet (75 mg total) by mouth daily.   ezetimibe (ZETIA) 10 MG tablet Take 1 tablet (10 mg total) by mouth daily.   isosorbide mononitrate (IMDUR) 30 MG 24 hr tablet Take 1 tablet (30 mg total) by mouth daily.   losartan (COZAAR) 25 MG tablet Take 1 tablet by mouth once daily (Patient taking differently: Take 25 mg by mouth daily.)   Menthol-Methyl Salicylate (ICY HOT) 69-48 % STCK Apply 1  application topically daily as needed (shoulder pain).   metFORMIN (GLUCOPHAGE) 500 MG tablet Take 500 mg by mouth every morning.   nitroGLYCERIN (NITROSTAT) 0.4 MG SL tablet Place 1 tablet (0.4 mg total) under the tongue every 5 (five) minutes as needed for chest pain.   Oxycodone HCl 10 MG TABS Take 1 tablet (10 mg total) by mouth every 6 (six) hours as needed.   vitamin B-12 (CYANOCOBALAMIN) 1000 MCG tablet Take 1,000 mcg by mouth daily.   Immunization History  Administered Date(s) Administered   PFIZER(Purple Top)SARS-COV-2 Vaccination 06/17/2019, 07/08/2019, 02/25/2020      Objective:   Physical Exam BP 120/74 (BP Location: Left Arm, Cuff Size: Normal)   Pulse 83   Temp 98 F (36.7 C) (Temporal)   Ht 5\' 4"  (1.626 m)   Wt 141 lb 12.8 oz (64.3 kg)   SpO2 97%   BMI 24.34 kg/m  GENERAL: HEAD: Normocephalic, atraumatic.  EYES: Pupils pinpoint enthesis patient on narcotic medication).  No scleral icterus.  Mild blepharoptosis on the left. MOUTH: Nose/mouth/throat not examined due to masking requirements for COVID 19. NECK: Supple. No thyromegaly. Trachea midline. No JVD.  No adenopathy. PULMONARY: Good air entry bilaterally.  No adventitious sounds. CARDIOVASCULAR: S1 and S2. Regular rate and rhythm.  No rubs, murmurs or gallops heard. ABDOMEN: Benign. MUSCULOSKELETAL: No joint deformity, no clubbing, no edema.  Decreased mobility of left shoulder due to pain. NEUROLOGIC: Mild blepharoptosis on the left otherwise nonfocal. SKIN: Intact,warm,dry.  On limited exam no rashes. PSYCH: Mood and behavior normal.  Images were reviewed as noted below:        Assessment & Plan:     ICD-10-CM   1. Pancoast tumor of left lung [facility])  C34.12    Patient needs diagnosis ASAP Robotic bronchoscopy scheduled for 17 June Withold Plavix 5 days prior to procedure Orders written    2. Pulmonary emphysema, unspecified emphysema type ([place])  J43.9    Currently asymptomatic in this regard     3. Pathologic change of voice  R49.9    "Deep" character of voice Suspect recurrent laryngeal nerve involvement    4. Left upper arm pain  M79.622    Intractable likely due to nerve invasion Continue current pain medication Add gabapentin, lidocaine patch (OTC) Will need radiation therapy ASAP      Meds ordered this encounter  Medications   gabapentin (NEURONTIN) 100 MG capsule    Sig: Take 1 capsule (100 mg total) by mouth 3 (three) times daily.    Dispense:  90 capsule    Refill:  2   Benefits, limitations and potential complications of the procedure were discussed with the patient/family  including, but not limited to bleeding, hemoptysis, respiratory failure requiring intubation and/or prolongued mechanical ventilation, infection, pneumothorax (collapse of lung) requiring chest tube placement, stroke or even death.  Patient is agreeable to proceed with bronchoscopy.  Renold Don, MD [facility]   *This note was dictated using voice recognition software/Dragon.  Despite best efforts to proofread, errors can occur which can change the meaning.  Any change was purely unintentional.

## 2020-10-07 NOTE — Telephone Encounter (Signed)
Phone pre admit 10/12/2020 between 1-5 and covid test 10/12/2020 at 8:05.  Patient's spouse, Sylvia(DPR) is aware and voiced her understanding.  Nothing further needed.

## 2020-10-07 NOTE — Patient Instructions (Signed)
We have schedule your procedure for 17 June which is Friday.  I have sent a prescription to your pharmacy for a pain medication that should work with your other pain medication.  I have started you on a low dose because it can make you sleepy you can take it 3 times a day.  This medication should be taken regularly and not just as needed.  The name of the medication is GABAPENTIN.  You can get patches over-the-counter for pain that specifically say they have a medication called LIDOCAINE in them.  You can place 1 or 2 patches over the area on the back that hurts and have them in place for no more than 12 hours/day.  We will coordinate follow-up for you with the cancer center.

## 2020-10-07 NOTE — Telephone Encounter (Signed)
Called [employer] at 9:54 am on 10/07/20 spoke with Quenten Raven. Prior Authorization is not required for procedure codes [phone] and [phone]. Call Ref # [id number]. Rhonda J Cobb

## 2020-10-07 NOTE — H&P (View-Only) (Signed)
Subjective:    Patient ID: Donald Lowery, male    DOB: 07-19-1950, 70 y.o.   MRN: [id number] Chief Complaint  Patient presents with   pulmonary consult    PET 10/05/2020. Prod cough with white sputum.    HPI Patient is a 70 year old former smoker with a 45-pack-year history of smoking, who presents for evaluation of a left upper lobe mass.  He is kindly referred by Dr. Delight Hoh.  The patient presents with his wife who is present during the visit.  The patient initially presented to the emergency room with a history of 4 to 6 weeks of left shoulder pain.  Was evaluated on May 25 and a CT scan was obtained at that time as x-ray showed density on the left upper lobe.  The CT scan confirmed a 7 cm left upper lobe mass with left mediastinal and subpleural invasion as well as invasion into the trachea, esophagus and left subclavian artery.  Underwent PET/CT which confirmed hypermetabolism on the left apical mass as well as several hypermetabolic pulmonary nodules bilaterally consistent with metastases.  The patient notes that his shoulder pain became very sharp and excruciating around May 15.  He has not noted any cough.  He has noted however that he has developed hiccups and that his voice has changed in character becoming deeper.  He has also noted weight loss of approximately 25 pounds over the last month.  His appetite remains good.  Over the last several weeks however he has had to sleep sitting in a recliner with a heating pad due to the pain on his shoulder.  He is on oxycodone but this does not relieve the pain consistently or sustainably.  He is on Plavix for a history of coronary artery disease with prior stents.  Stents were placed remotely.  We discussed diagnostic modalities and that bronchoscopy with navigational assistance would be the preferred method at present.  The patient is agreeable to proceed.  The patient was shown all of his films  Review of Systems A 10 point review of  systems was performed and it is as noted above otherwise negative.  Past Medical History:  Diagnosis Date   Coronary artery disease    a. 04/2018 NSTEMI/Cath: LM nl, LAD 50p, 65m/d diffuse-small caliber, LCX heavily Ca2+ sev prox/mid dzs, RCA 90 diff distal dzs into RPL and RPDA. EF 50-55%-->Med Rx.   History of echocardiogram    a. 04/2018 Echo: EF 55-60%, no rwma, mild MR, mildly dil LA. Nl RV fxn.   Hyperlipidemia    NSTEMI (non-ST elevated myocardial infarction) ([employer]) 05/15/2018   Tobacco abuse    a. Quit 04/2018.   Past Surgical History:  Procedure Laterality Date   BRAIN SURGERY     CARDIAC CATHETERIZATION     LEFT HEART CATH AND CORONARY ANGIOGRAPHY N/A 05/16/2018   Procedure: LEFT HEART CATH AND CORONARY ANGIOGRAPHY poss PCI;  Surgeon: [name], MD;  Location: [facility];  Service: Cardiovascular;  Laterality: N/A;   Family History  Problem Relation Age of Onset   Heart Problems Mother    Heart attack Father    Diabetes Brother    Heart Problems Brother    Social History   Tobacco Use   Smoking status: Former    Packs/day: 1.00    Years: 45.00    Pack years: 45.00    Types: Cigarettes    Quit date: 05/15/2018    Years since quitting: 2.4   Smokeless tobacco:  Never  Substance Use Topics   Alcohol use: Never   No Known Allergies  Current Meds  Medication Sig   aspirin EC 81 MG EC tablet Take 1 tablet (81 mg total) by mouth daily.   atorvastatin (LIPITOR) 80 MG tablet Take 1 tablet (80 mg total) by mouth daily at 6 PM.   clopidogrel (PLAVIX) 75 MG tablet Take 1 tablet (75 mg total) by mouth daily.   ezetimibe (ZETIA) 10 MG tablet Take 1 tablet (10 mg total) by mouth daily.   isosorbide mononitrate (IMDUR) 30 MG 24 hr tablet Take 1 tablet (30 mg total) by mouth daily.   losartan (COZAAR) 25 MG tablet Take 1 tablet by mouth once daily (Patient taking differently: Take 25 mg by mouth daily.)   Menthol-Methyl Salicylate (ICY HOT) 58-09 % STCK Apply 1  application topically daily as needed (shoulder pain).   metFORMIN (GLUCOPHAGE) 500 MG tablet Take 500 mg by mouth every morning.   nitroGLYCERIN (NITROSTAT) 0.4 MG SL tablet Place 1 tablet (0.4 mg total) under the tongue every 5 (five) minutes as needed for chest pain.   Oxycodone HCl 10 MG TABS Take 1 tablet (10 mg total) by mouth every 6 (six) hours as needed.   vitamin B-12 (CYANOCOBALAMIN) 1000 MCG tablet Take 1,000 mcg by mouth daily.   Immunization History  Administered Date(s) Administered   PFIZER(Purple Top)SARS-COV-2 Vaccination 06/17/2019, 07/08/2019, 02/25/2020      Objective:   Physical Exam BP 120/74 (BP Location: Left Arm, Cuff Size: Normal)   Pulse 83   Temp 98 F (36.7 C) (Temporal)   Ht 5\' 4"  (1.626 m)   Wt 141 lb 12.8 oz (64.3 kg)   SpO2 97%   BMI 24.34 kg/m  GENERAL: HEAD: Normocephalic, atraumatic.  EYES: Pupils pinpoint enthesis patient on narcotic medication).  No scleral icterus.  Mild blepharoptosis on the left. MOUTH: Nose/mouth/throat not examined due to masking requirements for COVID 19. NECK: Supple. No thyromegaly. Trachea midline. No JVD.  No adenopathy. PULMONARY: Good air entry bilaterally.  No adventitious sounds. CARDIOVASCULAR: S1 and S2. Regular rate and rhythm.  No rubs, murmurs or gallops heard. ABDOMEN: Benign. MUSCULOSKELETAL: No joint deformity, no clubbing, no edema.  Decreased mobility of left shoulder due to pain. NEUROLOGIC: Mild blepharoptosis on the left otherwise nonfocal. SKIN: Intact,warm,dry.  On limited exam no rashes. PSYCH: Mood and behavior normal.  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 Pulmonary emphysema, unspecified emphysema type ([place])  J43.9    Currently asymptomatic in this regard     3. Pathologic change of voice  R49.9    "Deep" character of voice Suspect recurrent laryngeal nerve involvement    4. Left upper arm pain  M79.622    Intractable likely due to nerve invasion Continue current pain medication Add gabapentin, lidocaine patch (OTC) Will need radiation therapy ASAP      Meds ordered this encounter  Medications   gabapentin (NEURONTIN) 100 MG capsule    Sig: Take 1 capsule (100 mg total) by mouth 3 (three) times daily.    Dispense:  90 capsule    Refill:  2   Benefits, limitations and potential complications of the procedure were discussed with the patient/family  including, but not limited to bleeding, hemoptysis, respiratory failure requiring intubation and/or prolongued mechanical ventilation, infection, pneumothorax (collapse of lung) requiring chest tube placement, stroke or even death.  Patient is agreeable to proceed with bronchoscopy.  Renold Don, MD [facility]   *This note was dictated using voice recognition software/Dragon.  Despite best efforts to proofread, errors can occur which can change the meaning.  Any change was purely unintentional.

## 2020-10-08 ENCOUNTER — Ambulatory Visit
Admission: RE | Admit: 2020-10-08 | Discharge: 2020-10-08 | Disposition: A | Discharge status: Home / Self Care | Payer: Medicare HMO | Source: Ambulatory Visit | Attending: Oncology | Admitting: Oncology

## 2020-10-08 DIAGNOSIS — R918 Other nonspecific abnormal finding of lung field: Secondary | ICD-10-CM | POA: Insufficient documentation

## 2020-10-08 MED ORDER — GADOBUTROL 1 MMOL/ML IV SOLN
6.0000 mL | Freq: Once | INTRAVENOUS | Status: AC | PRN
  Administered 2020-10-08: 6 mL via INTRAVENOUS

## 2020-10-09 ENCOUNTER — Encounter: Payer: Self-pay | Admitting: Pulmonary Disease

## 2020-10-10 ENCOUNTER — Telehealth: Payer: Self-pay | Admitting: *Deleted

## 2020-10-10 NOTE — Telephone Encounter (Signed)
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 There is no associated enhancement to suggest metastasis.   No evidence of intracranial metastatic disease.   These results will be called to the ordering clinician or representative by the Radiologist Assistant, and communication documented in the PACS or [employer].     Electronically Signed   By: Macy Mis M.D.   On: 10/08/2020 18:57

## 2020-10-12 ENCOUNTER — Other Ambulatory Visit
Admission: RE | Admit: 2020-10-12 | Discharge: 2020-10-12 | Disposition: A | Discharge status: Home / Self Care | Payer: Medicare HMO | Source: Ambulatory Visit | Attending: Pulmonary Disease | Admitting: Pulmonary Disease

## 2020-10-12 ENCOUNTER — Other Ambulatory Visit: Payer: Self-pay

## 2020-10-12 ENCOUNTER — Telehealth: Payer: Self-pay | Admitting: *Deleted

## 2020-10-12 DIAGNOSIS — Z8616 Personal history of COVID-19: Secondary | ICD-10-CM

## 2020-10-12 DIAGNOSIS — Z01812 Encounter for preprocedural laboratory examination: Secondary | ICD-10-CM | POA: Insufficient documentation

## 2020-10-12 DIAGNOSIS — U071 COVID-19: Secondary | ICD-10-CM | POA: Diagnosis not present

## 2020-10-12 HISTORY — DX: Essential (primary) hypertension: I10

## 2020-10-12 HISTORY — DX: Personal history of COVID-19: Z86.16

## 2020-10-12 LAB — SARS CORONAVIRUS 2 (TAT 6-24 HRS): SARS Coronavirus 2: POSITIVE — AB

## 2020-10-12 NOTE — Telephone Encounter (Signed)
Karen Kitchens, NP  P Cv Div Preop Callback Request for pre-operative cardiac clearance:     1. What type of surgery is being performed?  ROBOTIC ASSISTED VIDEO BRONCHOSCOPY WITH ENDOBRONCHIAL NAVIGATION   2. When is this surgery scheduled?  10/14/2020     3. Are there any medications that need to be held prior to surgery?  ASA and CLOPIDOGREL appears to be on hold her pulmonary medicine.   4. Practice name and name of physician performing surgery?  Performing surgeon: Dr. Vernard Gambles, MD  Requesting clearance: Honor Loh, FNP-C       5. Anesthesia type (none, local, MAC, general)? General   6. What is the office phone and fax number?    Phone: [phone]  Fax: [phone]   ATTENTION: Unable to create telephone message as per your standard workflow. Directed by [facility] providers to send requests for cardiac clearance to this pool for appropriate distribution to provider covering pre-operative clearances.   Honor Loh, MSN, APRN, FNP-C, CEN  [facility]  Peri-operative Services Nurse Practitioner  Phone: [phone]  10/12/20 2:44 PM

## 2020-10-12 NOTE — Telephone Encounter (Signed)
Dr. Rockey Situ  Wanted to run this past you. This patient is to undergo a bronchoscopy with navigation for presumed lung cancer with Dr. Galen Daft. Procedure is scheduled for 10/14/20 and the patient was alreadt instructed (and has been off) ASA and Plavix since 10/07/20 per pulmonary. Spoke with him on the phone today and although he is not having chest pain, he is having pain in his back and arm, felt to be coming from the lung mass pressing on nerves. I discussed that he is at higher risk for the procedure given his severe CAD per cath however this is not prohibitive of his upcoming procedure. He has follow up with you 10/18/20 which he was encouraged to keep.   Thank you

## 2020-10-12 NOTE — Pre-Procedure Instructions (Signed)
Pre-admission instructions and Anesthesia interview done with patients wife- Garion Wempe who is designated party.

## 2020-10-12 NOTE — Patient Instructions (Addendum)
Your procedure is scheduled on: 10/14/20 - Friday Report to the Registration Desk on the 1st floor of the [facility]. To find out your arrival time, please call [phone] between 1PM - 3PM on: 10/13/20   REMEMBER: Instructions that are not followed completely may result in serious medical risk, up to and including death; or upon the discretion of your surgeon and anesthesiologist your surgery may need to be rescheduled.  Do not eat food or drink any fluids after midnight the night before surgery.  No gum chewing, lozengers or hard candies.  TAKE THESE MEDICATIONS THE MORNING OF SURGERY WITH A SIP OF WATER:  - gabapentin (NEURONTIN) 100 MG capsule - isosorbide mononitrate (IMDUR) 30 MG 24 hr tablet - icosapent Ethyl (VASCEPA) 1 g capsule  Stop Metformin  2 days prior to surgery. Do not take 06/15, 06/16 and do not take the morning of surgery.  Follow recommendations from Cardiologist, Pulmonologist or PCP regarding stopping Aspirin, Coumadin, Plavix, Eliquis, Pradaxa, or Pletal. Per patient Plavix and Aspirin stopped 10/09/20.  One week prior to surgery: Stop Anti-inflammatories (NSAIDS) such as Advil, Aleve, Ibuprofen, Motrin, Naproxen, Naprosyn and Aspirin based products such as Excedrin, Goodys Powder, BC Powder.  Stop ANY OVER THE COUNTER supplements until after surgery.  You may however, continue to take Tylenol if needed for pain up until the day of surgery.  No Alcohol for 24 hours before or after surgery.  No Smoking including e-cigarettes for 24 hours prior to surgery.  No chewable tobacco products for at least 6 hours prior to surgery.  No nicotine patches on the day of surgery.  Do not use any "recreational" drugs for at least a week prior to your surgery.  Please be advised that the combination of cocaine and anesthesia may have negative outcomes, up to and including death. If you test positive for cocaine, your surgery will be cancelled.  On the morning of  surgery brush your teeth with toothpaste and water, you may rinse your mouth with mouthwash if you wish. Do not swallow any toothpaste or mouthwash.  Do not wear jewelry, make-up, hairpins, clips or nail polish.  Do not wear lotions, powders, or perfumes.   Do not shave body from the neck down 48 hours prior to surgery just in case you cut yourself which could leave a site for infection.  Also, freshly shaved skin may become irritated if using the CHG soap.  Contact lenses, hearing aids and dentures may not be worn into surgery.  Do not bring valuables to the hospital. [facility] is not responsible for any missing/lost belongings or valuables.   Notify your doctor if there is any change in your medical condition (cold, fever, infection).  Wear comfortable clothing (specific to your surgery type) to the hospital.  After surgery, you can help prevent lung complications by doing breathing exercises.  Take deep breaths and cough every 1-2 hours. Your doctor may order a device called an Incentive Spirometer to help you take deep breaths. When coughing or sneezing, hold a pillow firmly against your incision with both hands. This is called "splinting." Doing this helps protect your incision. It also decreases belly discomfort.  If you are being admitted to the hospital overnight, leave your suitcase in the car. After surgery it may be brought to your room.  If you are being discharged the day of surgery, you will not be allowed to drive home. You will need a responsible adult (18 years or older) to drive you home  and stay with you that night.   If you are taking public transportation, you will need to have a responsible adult (18 years or older) with you. Please confirm with your physician that it is acceptable to use public transportation.   Please call the [facility] Dept. at [phone] if you have any questions about these instructions.  Surgery Visitation  Policy:  Patients undergoing a surgery or procedure may have one family member or support person with them as long as that person is not COVID-19 positive or experiencing its symptoms.  That person may remain in the waiting area during the procedure.  Inpatient Visitation:    Visiting hours are 7 a.m. to 8 p.m. Inpatients will be allowed two visitors daily. The visitors may change each day during the patient's stay. No visitors under the age of 50. Any visitor under the age of 85 must be accompanied by an adult. The visitor must pass COVID-19 screenings, use hand sanitizer when entering and exiting the patient's room and wear a mask at all times, including in the patient's room. Patients must also wear a mask when staff or their visitor are in the room. Masking is required regardless of vaccination status.

## 2020-10-13 ENCOUNTER — Telehealth: Payer: Self-pay | Admitting: Cardiovascular Disease

## 2020-10-13 ENCOUNTER — Telehealth: Payer: Self-pay | Admitting: Urgent Care

## 2020-10-13 ENCOUNTER — Telehealth: Payer: Self-pay | Admitting: *Deleted

## 2020-10-13 NOTE — Progress Notes (Signed)
  [facility] Perioperative Services: Pre-Admission/Anesthesia Testing  Abnormal Lab Notification   Date: 10/13/20  Name: Donald Lowery MRN:   [id number]  Re: Abnormal labs noted during PAT appointment   Provider(s) Notified:   PROVIDER SPECIALTY  Leola Brazil, MD Pulmonary Medicine  Delight Hoh, MD Medical Oncology  Noreene Filbert, MD Radiation Oncology  [name], MD Cardiology  Argentina Donovan, RN Medical Oncology  [facility], RN Oncology Lung Navigator   Notification mode: Communicated directly with above via [facility] and/or forwarded via in-basket   ABNORMAL LAB VALUE(S):  Covid-19 Nucleic Acid Test Results Lab Results  Component Value Date   [name] (A) 10/12/2020   Notes:   Patient is scheduled for a ENB/EBUS on 10/14/2020. I have communicated with PCCM office and procedure will need to be rescheduled per provider. Procedure rescheduled at this time for 10/24/2020.   Primary medical oncologist Grayland Ormond, MD) is out of the office this week, however I have communicated with his RN and the RN lung navigator to make them aware of the unavoidable delay. I will forward a copy of this note to Dr. Grayland Ormond as well.   PCCM provider asking that patient medical oncology appointments be rescheduled and that he also be set up to meet with radiation oncologist for discussions regarding XRT. Lung navigator to handle appointment scheduled with both medical and radiation oncology. She will also make patient aware of the results of his recent SARS-CoV-2 testing.  Additionally, patient has a appointment with cardiology coming up on 10/18/2020. I have let that office aware out of an abundance of caution in the setting of the current SARS-CoV-2 pandemic  This is a courtesy FYI; no formal response is required.  Honor Loh, MSN, APRN, FNP-C, CEN [facility]  Peri-operative Services Nurse Practitioner Phone: [phone] Fax:  [phone] 10/13/20 8:30 AM

## 2020-10-13 NOTE — Telephone Encounter (Signed)
Case has been rescheduled to 10/24/20 due to covid positive. Per Dr. Patsey Berthold, hold plavix 5 days prior to bronch.  Patient's sylvia(DPR) is aware and voiced her understanding.  Nothing further needed.

## 2020-10-13 NOTE — Telephone Encounter (Signed)
Patient wife called reporting that patients surgery had to be rescheduled because he is COVID positive. It looks like he is scheduled for 6/27 for procedure. He currently has a follow up appointment on 6/22 with Dr Grayland Ormond for biopsy results

## 2020-10-13 NOTE — Telephone Encounter (Signed)
  Patient Consent for Virtual Visit        Donald Lowery has provided verbal consent on 10/13/2020 for a virtual visit (video or telephone).   CONSENT FOR VIRTUAL VISIT FOR:  Donald Lowery  By participating in this virtual visit I agree to the following:  I hereby voluntarily request, consent and authorize [facility] and its employed or contracted physicians, physician assistants, nurse practitioners or other licensed health care professionals (the Practitioner), to provide me with telemedicine health care services (the "Services") as deemed necessary by the treating Practitioner. I acknowledge and consent to receive the Services by the Practitioner via telemedicine. I understand that the telemedicine visit will involve communicating with the Practitioner through live audiovisual communication technology and the disclosure of certain medical information by electronic transmission. I acknowledge that I have been given the opportunity to request an in-person assessment or other available alternative prior to the telemedicine visit and am voluntarily participating in the telemedicine visit.  I understand that I have the right to withhold or withdraw my consent to the use of telemedicine in the course of my care at any time, without affecting my right to future care or treatment, and that the Practitioner or I may terminate the telemedicine visit at any time. I understand that I have the right to inspect all information obtained and/or recorded in the course of the telemedicine visit and may receive copies of available information for a reasonable fee.  I understand that some of the potential risks of receiving the Services via telemedicine include:  Delay or interruption in medical evaluation due to technological equipment failure or disruption; Information transmitted may not be sufficient (e.g. poor resolution of images) to allow for appropriate medical decision making by the Practitioner; and/or   In rare instances, security protocols could fail, causing a breach of personal health information.  Furthermore, I acknowledge that it is my responsibility to provide information about my medical history, conditions and care that is complete and accurate to the best of my ability. I acknowledge that Practitioner's advice, recommendations, and/or decision may be based on factors not within their control, such as incomplete or inaccurate data provided by me or distortions of diagnostic images or specimens that may result from electronic transmissions. I understand that the practice of medicine is not an exact science and that Practitioner makes no warranties or guarantees regarding treatment outcomes. I acknowledge that a copy of this consent can be made available to me via my patient portal ([facility]), or I can request a printed copy by calling the office of [facility].    I understand that my insurance will be billed for this visit.   I have read or had this consent read to me. I understand the contents of this consent, which adequately explains the benefits and risks of the Services being provided via telemedicine.  I have been provided ample opportunity to ask questions regarding this consent and the Services and have had my questions answered to my satisfaction. I give my informed consent for the services to be provided through the use of telemedicine in my medical care

## 2020-10-17 NOTE — Telephone Encounter (Signed)
    LAMIR RACCA DOB:  13-Dec-1950  MRN:  [id number]   Primary Cardiologist: [name], MD  Chart reviewed as part of pre-operative protocol coverage. Patient scheduled for robotic assisted video bronchoscopy with endobrachial navigation on 10/24/2020. Per Dr. Rockey Situ, [place] to proceed with procedure and OK to hold Plavix for 5 days prior to procedure. However, he recommends staying on Aspirin perioperatively. Please restart Plavix as soon as able following procedure.   I will route this recommendation to the requesting party via Epic fax function and remove from pre-op pool.  Please call with questions.  Darreld Mclean, PA-C 10/17/2020, 9:38 AM

## 2020-10-18 ENCOUNTER — Telehealth (INDEPENDENT_AMBULATORY_CARE_PROVIDER_SITE_OTHER): Payer: Medicare HMO | Admitting: Cardiovascular Disease

## 2020-10-18 ENCOUNTER — Encounter: Payer: Self-pay | Admitting: Cardiovascular Disease

## 2020-10-18 ENCOUNTER — Other Ambulatory Visit: Payer: Self-pay

## 2020-10-18 VITALS — BP 134/70 | HR 84 | Ht 65.0 in | Wt 141.0 lb

## 2020-10-18 DIAGNOSIS — Z720 Tobacco use: Secondary | ICD-10-CM

## 2020-10-18 DIAGNOSIS — E785 Hyperlipidemia, unspecified: Secondary | ICD-10-CM | POA: Diagnosis not present

## 2020-10-18 DIAGNOSIS — I10 Essential (primary) hypertension: Secondary | ICD-10-CM

## 2020-10-18 DIAGNOSIS — I25118 Atherosclerotic heart disease of native coronary artery with other forms of angina pectoris: Secondary | ICD-10-CM | POA: Diagnosis not present

## 2020-10-18 DIAGNOSIS — R001 Bradycardia, unspecified: Secondary | ICD-10-CM

## 2020-10-18 NOTE — Progress Notes (Signed)
Virtual Visit via Telephone Note   This visit type was conducted due to national recommendations for restrictions regarding the COVID-19 Pandemic (e.g. social distancing) in an effort to limit this patient's exposure and mitigate transmission in our community.  Due to his co-morbid illnesses, this patient is at least at moderate risk for complications without adequate follow up.  This format is felt to be most appropriate for this patient at this time.  The patient did not have access to video technology/had technical difficulties with video requiring transitioning to audio format only (telephone).  All issues noted in this document were discussed and addressed.  No physical exam could be performed with this format.  Please refer to the patient's chart for his  consent to telehealth for [name].   I connected with  Donald Lowery on 10/18/20 by a video enabled telemedicine application and verified that I am speaking with the correct person using two identifiers. I am contacting the patient above from our cardiology clinic office or alternate office work station to their home, I discussed the limitations of evaluation and management by telemedicine. The patient expressed understanding and agreed to proceed.   Cardiology Office Note  Date:  10/18/2020   ID:  Donald Lowery, DOB [birth year], MRN [id number]  PCP:  [name], NP    Evaluation Performed:  Follow-up visit    Chief Complaint  Patient presents with   12 month follow up     "Doing well." Patient is scheduled for a lung biopsy on Monday, October 23, 2020. Medications reviewed by the patient verbally.     HPI:  Donald Lowery is a 70 y.o. male with history of  CAD with non-STEMI in 04/2018  cath  revealed severe, diffuse, distal and small vessel disease  hypertension,  hyperlipidemia, and  tobacco abuse quitting in 04/2018  Lung cancer who presents for follow-up of his coronary artery disease, preop  cardiovascular evaluation prior to bronchoscopy, biopsy  history of 4 to 6 weeks of left shoulder pain.   evaluated on May 25 and a CT scan was obtained at that time as x-ray showed density on the left upper lobe.   The CT scan confirmed a 7 cm left upper lobe mass with left mediastinal and subpleural invasion as well as invasion into the trachea, esophagus and left subclavian artery.  Underwent PET/CT which confirmed hypermetabolism on the left apical mass as well as several hypermetabolic pulmonary nodules bilaterally consistent with metastases.  Bronch plan for Monday, Then follow up with oncology and radiation oncology  Feels weak, has a sore scratchy throat Pain in arm on left and back,   Not eating well. Weight down, 30 pounds Denies anginal symptoms  Covid + last week, denies any significant symptoms   Past medical history reviewed admitted to the hospital in 04/2018 with a non-STEMI with troponin peaking at 7.4. Echo showed normal LV systolic function.    revealed severe, diffuse, distal and small vessel disease without a clear culprit.  Medical management was advised.    quit smoking, did not restart   Lab Results  Component Value Date   CHOL 137 08/25/2019   HDL 36 (L) 08/25/2019   LDLCALC 88 08/25/2019   TRIG 64 08/25/2019    cath May 16, 2018 Severe three-vessel disease, Vessels are small and diffusely calcified   PMH:   has a past medical history of Coronary artery disease, Diabetes mellitus without complication ([place]), History of echocardiogram,  Hyperlipidemia, Hypertension, NSTEMI (non-ST elevated myocardial infarction) ([place]) (05/15/2018), and Tobacco abuse.  PSH:    Past Surgical History:  Procedure Laterality Date   BRAIN SURGERY     CARDIAC CATHETERIZATION     LEFT HEART CATH AND CORONARY ANGIOGRAPHY N/A 05/16/2018   Procedure: LEFT HEART CATH AND CORONARY ANGIOGRAPHY poss PCI;  Surgeon: [name], MD;  Location: [facility];   Service: Cardiovascular;  Laterality: N/A;    Current Outpatient Medications  Medication Sig Dispense Refill   aspirin EC 81 MG EC tablet Take 1 tablet (81 mg total) by mouth daily. 30 tablet 0   atorvastatin (LIPITOR) 80 MG tablet Take 1 tablet (80 mg total) by mouth daily at 6 PM. 90 tablet 0   clopidogrel (PLAVIX) 75 MG tablet Take 1 tablet (75 mg total) by mouth daily. 90 tablet 3   dicyclomine (BENTYL) 10 MG capsule Take 1 capsule (10 mg total) by mouth 3 (three) times daily as needed for up to 14 days for spasms. 20 capsule 0   ezetimibe (ZETIA) 10 MG tablet Take 1 tablet (10 mg total) by mouth daily. 90 tablet 0   gabapentin (NEURONTIN) 100 MG capsule Take 1 capsule (100 mg total) by mouth 3 (three) times daily. 90 capsule 2   icosapent Ethyl (VASCEPA) 1 g capsule Take 2 capsules (2 g total) by mouth 2 (two) times daily. 120 capsule 11   isosorbide mononitrate (IMDUR) 30 MG 24 hr tablet Take 1 tablet (30 mg total) by mouth daily. 90 tablet 3   losartan (COZAAR) 25 MG tablet Take 1 tablet by mouth once daily 90 tablet 2   Menthol-Methyl Salicylate (ICY HOT) 49-44 % STCK Apply 1 application topically daily as needed (shoulder pain).     metFORMIN (GLUCOPHAGE) 500 MG tablet Take 500 mg by mouth every morning.     nitroGLYCERIN (NITROSTAT) 0.4 MG SL tablet Place 1 tablet (0.4 mg total) under the tongue every 5 (five) minutes as needed for chest pain. 25 tablet 3   Oxycodone HCl 10 MG TABS Take 1 tablet (10 mg total) by mouth every 6 (six) hours as needed. 60 tablet 0   vitamin B-12 (CYANOCOBALAMIN) 1000 MCG tablet Take 1,000 mcg by mouth daily.     No current facility-administered medications for this visit.     Allergies:   Patient has no known allergies.   Social History:  The patient  reports that he quit smoking about 2 years ago. His smoking use included cigarettes. He has a 45.00 pack-year smoking history. He has never used smokeless tobacco. He reports that he does not drink  alcohol and does not use drugs.   Family History:   family history includes Diabetes in his brother; Heart Problems in his brother and mother; Heart attack in his father.    Review of Systems: Review of Systems  Constitutional:  Positive for weight loss.  HENT: Negative.    Respiratory: Negative.    Cardiovascular: Negative.   Gastrointestinal: Negative.   Musculoskeletal: Negative.   Neurological: Negative.   Psychiatric/Behavioral: Negative.    All other systems reviewed and are negative.  PHYSICAL EXAM: VS:  BP 134/70   Pulse 84   Ht 5\' 5"  (1.651 m)   Wt 141 lb (64 kg)   BMI 23.46 kg/m  , BMI Body mass index is 23.46 kg/m.   Recent Labs: 09/16/2020: ALT 14 09/21/2020: BUN 13; Creatinine, Ser 0.96; Hemoglobin 11.2; Platelets 329; Potassium 4.2; Sodium 130  Lipid Panel Lab Results  Component Value Date   CHOL 137 08/25/2019   HDL 36 (L) 08/25/2019   LDLCALC 88 08/25/2019   TRIG 64 08/25/2019    Wt Readings from Last 3 Encounters:  10/18/20 141 lb (64 kg)  10/07/20 141 lb 12.8 oz (64.3 kg)  09/27/20 146 lb 1.6 oz (66.3 kg)     ASSESSMENT AND PLAN:  Problem List Items Addressed This Visit       Cardiology Problems   CAD (coronary artery disease) - Primary   Other Visit Diagnoses     Hyperlipidemia LDL goal <70       Tobacco abuse       Essential hypertension       Bradycardia         Coronary disease with stable angina Stop Plavix, stay on aspirin 81 No further cardiac testing needed, denies anginal symptoms Acceptable risk for bronchoscopy next week  Hypertension Blood pressure is well controlled on today's visit. No changes made to the medications. Recommend he closely monitor blood pressure given 30 pound weight loss  Hyperlipidemia On Lipitor and Zetia,  No changes to his medications  Lung cancer Long history of smoking, Severe pain left shoulder Scheduled for bronchoscopy, biopsy, to see oncology and radiation oncology next  week   COVID-19 Education: The signs and symptoms of COVID-19 were discussed with the patient and how to seek care for testing (follow up with PCP or arrange E-visit).  The importance of social distancing was discussed today.  Patient Risk:   After full review of this patients clinical status, I feel that they are at least moderate risk at this time.  Time:   Today, I have spent 25 minutes with the patient with telehealth technology discussing the cardiac and medical problems/diagnoses detailed above   Additional 10 min spent reviewing the chart prior to patient visit today  Medication Adjustments/Labs and Tests Ordered: Current medicines are reviewed at length with the patient today.  Concerns regarding medicines are outlined above.     Signed, [name], MD  [place] [facility] [address] [place], [place], [place] [address]

## 2020-10-18 NOTE — Patient Instructions (Addendum)
Medication Instructions:  Stop plavix  This medication was removed from your medication list  If you need a refill on your cardiac medications before your next appointment, please call your pharmacy.   Lab work: No new labs needed  Testing/Procedures: No new testing needed  Follow-Up: At [facility], you and your health needs are our priority.  As part of our continuing mission to provide you with exceptional heart care, we have created designated Provider Care Teams.  These Care Teams include your primary Cardiologist (physician) and Advanced Practice Providers (APPs -  Physician Assistants and Nurse Practitioners) who all work together to provide you with the care you need, when you need it.  You will need a follow up appointment in 12 months Someone will call closer to one year to make your next appointment   Providers on your designated Care Team:   Murray Hodgkins, NP Christell Faith, PA-C Marrianne Mood, PA-C [place], [place]  COVID-19 Vaccine Information can be found at: [email or web address] For questions related to vaccine distribution or appointments, please email [email or web address] or call [phone].

## 2020-10-19 ENCOUNTER — Inpatient Hospital Stay: Payer: Medicare HMO | Admitting: Oncology

## 2020-10-20 ENCOUNTER — Encounter: Payer: Self-pay | Admitting: Pulmonary Disease

## 2020-10-20 NOTE — Progress Notes (Signed)
Perioperative Services  Pre-Admission/Anesthesia Testing Clinical Review  Date: 10/21/20  Patient Demographics:  Name: Donald Lowery DOB:   27-May-1950 MRN:   [id number]  Planned Surgical Procedure(s):   Choose an anesthesia record to view details      NOTE: Available PAT nursing documentation and vital signs have been reviewed. Clinical nursing staff has updated patient's PMH/PSHx, current medication list, and drug allergies/intolerances to ensure comprehensive history available to assist in medical decision making as it pertains to the aforementioned surgical procedure and anticipated anesthetic course. Extensive review of available clinical information performed.  PMH and PSHx updated with any diagnoses/procedures that  may have been inadvertently omitted during his intake with the pre-admission testing department's nursing staff.  Clinical Discussion:  Donald Lowery is a 70 y.o. male who is submitted for pre-surgical anesthesia review and clearance prior to him undergoing the above procedure. Patient is a Former Smoker (45 pack years; quit 04/2018). Pertinent PMH includes: CAD, NSTEMI, aortic atherosclerosis, HTN, HLD, T2DM, paraseptal emphysema, LUL Pancoast tumor, fatty Bochdalek hernia.  Patient is followed by cardiology Rockey Situ, MD). He was last  seen in the cardiology clinic on 10/18/2020; notes reviewed.  At the time of this clinic visit, patient doing well overall from a cardiovascular perspective.  He denied any chest pain, PND, orthopnea, palpitations, significant peripheral edema, vertiginous symptoms, or presyncope/syncope.  Patient had been diagnosed as being SARS-CoV-2 (+) on 10/12/2020, however was not experiencing significant symptoms related to that diagnosis.  Patient with a poor appetite resulting and significant weight loss; down 30 pounds.  Patient with a PMH significant for cardiovascular diagnoses.  Patient was seen in the ED on 05/15/2018 for episodes of  chest pain.  Cardiac enzymes were positive.  Patient admitted for NSTEMI.    Diagnostic heart catheterization on 05/16/2018 demonstrated severe three-vessel CAD.  LVEF low normal at 50-55%.  Medical management was recommended.  If symptoms persisted, myocardial perfusion imaging study recommended to identify region of ischemia followed by high risk stenting if possible.  TTE performed on 05/17/2018 revealed normal left ventricular systolic function with an EF of 55-60%.  There were no regional wall motion abnormalities.  Left atrium was mildly dilated, and there was mild mitral valve regurgitation (see full interpretation of cardiovascular testing below).  Patient on GDMT for his HTN and HLD diagnoses.  Blood pressure well controlled at 134/70 on currently prescribed nitrate and ARB therapies.  Patient is on atorvastatin, ezetimibe, and icosapent ethyl for his HLD. Functional capacity, as defined by DASI, is documented as being >/= 4 METS.  No changes were made to patient's medication regimen.  Patient to follow-up with outpatient cardiology at defined intervals for ongoing care management.  Interestingly enough, patient was scheduled to undergo cholecystectomy with Dr. Herbert Pun.  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  There was invasion into the trachea, esophagus, and left subclavian artery well.  Cholecystectomy was postponed and patient was referred to oncology Grayland Ormond, MD).  Subsequent PET scan revealed that mass was hypermetabolic. Patient was scheduled to see pulmonary medicine provider Patsey Berthold, MD) to discuss tissue biopsy for diagnosis and staging of patient's newly discovered bronchogenic carcinoma. He is scheduled for an ENB/EBUS on 10/24/2020 with Dr. Vernard Gambles, MD.    Given patient's past medical history significant for cardiovascular diagnoses, presurgical cardiac clearance was sought by the  PAT team.  Per cardiology, "patient is felt to be at an ACCEPTABLE risk  for bronchoscopy next week.  No further cardiovascular testing is needed as he denies anginal symptoms". This patient is on daily DAPT (ASA + clopidogrel) therapy. He has been instructed on recommendations for holding these medications for 5 days prior to his procedure with plans to restart as soon as postoperative bleeding risk felt to be minimized by his attending surgeon. The patient has been instructed that his last dose of his anticoagulant will be on 10/18/2020.  Patient denies previous perioperative complications with anesthesia in the past. In review of this patient's EMR, there are no records available for review regarding patient's past surgical/anesthetic courses within the [facility] system.  Vitals with BMI 10/18/2020 10/07/2020 09/27/2020  Height 5\' 5"  5\' 4"  5\' 4"   Weight 141 lbs 141 lbs 13 oz 146 lbs 2 oz  BMI 23.46 10.93 23.55  Systolic 732 202 542  Diastolic 70 74 62  Pulse 84 83 105    Providers/Specialists:   NOTE: Primary physician provider listed below. Patient may have been seen by APP or partner within same practice.   PROVIDER ROLE / SPECIALTY LAST Sherrian Divers, MD  Pulmonary Medicine 10/07/2020  Dayton Martes Victoriano Lain, NP  Primary Care Provider 05/31/2020  [name], MD  Cardiology 10/18/2020  Delight Hoh, MD  Medical Oncology 09/27/2020  Berton Mount, MD  Radiation Oncology Pending   Allergies:  Patient has no known allergies.  Current Home Medications:   No current facility-administered medications for this encounter.    atorvastatin (LIPITOR) 80 MG tablet   ezetimibe (ZETIA) 10 MG tablet   gabapentin (NEURONTIN) 100 MG capsule   icosapent Ethyl (VASCEPA) 1 g capsule   isosorbide mononitrate (IMDUR) 30 MG 24 hr tablet   losartan (COZAAR) 25 MG tablet   Menthol-Methyl Salicylate (ICY HOT) 70-62 % STCK   metFORMIN (GLUCOPHAGE) 500 MG tablet   nitroGLYCERIN  (NITROSTAT) 0.4 MG SL tablet   Oxycodone HCl 10 MG TABS   vitamin B-12 (CYANOCOBALAMIN) 1000 MCG tablet   aspirin EC 81 MG EC tablet   dicyclomine (BENTYL) 10 MG capsule   History:   Past Medical History:  Diagnosis Date   Aortic atherosclerosis ([place])    Bochdalek hernia 09/21/2020   fatty   Coronary artery disease    a. 04/2018 NSTEMI/Cath: LM nl, LAD 50p, 62m/d diffuse-small caliber, LCX heavily Ca2+ sev prox/mid dzs, RCA 90 diff distal dzs into RPL and RPDA. EF 50-55%-->Med Rx.   Hepatic steatosis    History of 2019 novel coronavirus disease (COVID-19) 10/12/2020   History of echocardiogram    a. 04/2018 Echo: EF 55-60%, no rwma, mild MR, mildly dil LA. Nl RV fxn.   Hyperlipidemia    Hypertension    NSTEMI (non-ST elevated myocardial infarction) ([place]) 05/15/2018   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[place]) 09/21/2020   a.) 7 cm LUL mass with left subclavian/proximal vertebral encasement   Paraseptal emphysema ([facility])    T2DM (type 2 diabetes mellitus) ([place])    Tobacco abuse    a. Quit 04/2018.   Past Surgical History:  Procedure Laterality Date   BRAIN SURGERY     CARDIAC CATHETERIZATION     LEFT HEART CATH AND CORONARY ANGIOGRAPHY N/A 05/16/2018   Procedure: LEFT HEART CATH AND CORONARY ANGIOGRAPHY poss PCI;  Surgeon: [name], MD;  Location: [facility];  Service: Cardiovascular;  Laterality: N/A;   Family History  Problem Relation Age of Onset   Heart Problems Mother    Heart attack Father  Diabetes Brother    Heart Problems Brother    Social History   Tobacco Use   Smoking status: Former    Packs/day: 1.00    Years: 45.00    Pack years: 45.00    Types: Cigarettes    Quit date: 05/15/2018    Years since quitting: 2.4   Smokeless tobacco: Never  Vaping Use   Vaping Use: Never used  Substance Use Topics   Alcohol use: Never   Drug use: Never    Pertinent Clinical Results:  LABS: Labs reviewed: Acceptable for surgery.  Hospital Outpatient Visit  on 10/12/2020  Component Date Value Ref Range Status   SARS Coronavirus 2 10/12/2020 POSITIVE (A) NEGATIVE Final   Comment: (NOTE) SARS-CoV-2 target nucleic acids are DETECTED.  The SARS-CoV-2 RNA is generally detectable in upper and lower respiratory specimens during the acute phase of infection. Positive results are indicative of the presence of SARS-CoV-2 RNA. Clinical correlation with patient history and other diagnostic information is  necessary to determine patient infection status. Positive results do not rule out bacterial infection or co-infection with other viruses.  The expected result is Negative.  Fact Sheet for Patients: SugarRoll.be  Fact Sheet for Healthcare Providers: https://www.woods-mathews.com/  This test is not yet approved or cleared by the [place] FDA and  has been authorized for detection and/or diagnosis of SARS-CoV-2 by FDA under an Emergency Use Authorization (EUA). This EUA will remain  in effect (meaning this test can be used) for the duration of the COVID-19 declaration under Section 564(b)(1) of the Act, 21 U.                          S.C. section 360bbb-3(b)(1), unless the authorization is terminated or revoked sooner.   Performed at [facility], [place] [address], [place], [place] [address]   Hospital Outpatient Visit on 10/04/2020  Component Date Value Ref Range Status   Glucose-Capillary 10/04/2020 127 (A) 70 - 99 mg/dL Final   Glucose reference range applies only to samples taken after fasting for at least 8 hours.  Admission on 09/21/2020, Discharged on 09/21/2020  Component Date Value Ref Range Status   WBC 09/21/2020 12.3 (A) 4.0 - 10.5 K/uL Final   RBC 09/21/2020 3.88 (A) 4.22 - 5.81 MIL/uL Final   Hemoglobin 09/21/2020 11.2 (A) 13.0 - 17.0 g/dL Final   HCT 09/21/2020 32.9 (A) 39.0 - 52.0 % Final   MCV 09/21/2020 84.8  80.0 - 100.0 fL Final   MCH 09/21/2020 28.9  26.0 - 34.0 pg Final    MCHC 09/21/2020 34.0  30.0 - 36.0 g/dL Final   RDW 09/21/2020 12.7  11.5 - 15.5 % Final   Platelets 09/21/2020 329  150 - 400 K/uL Final   nRBC 09/21/2020 0.0  0.0 - 0.2 % Final   Neutrophils Relative % 09/21/2020 77  % Final   Neutro Abs 09/21/2020 9.5 (A) 1.7 - 7.7 K/uL Final   Lymphocytes Relative 09/21/2020 12  % Final   Lymphs Abs 09/21/2020 1.5  0.7 - 4.0 K/uL Final   Monocytes Relative 09/21/2020 7  % Final   Monocytes Absolute 09/21/2020 0.9  0.1 - 1.0 K/uL Final   Eosinophils Relative 09/21/2020 2  % Final   Eosinophils Absolute 09/21/2020 0.2  0.0 - 0.5 K/uL Final   Basophils Relative 09/21/2020 1  % Final   Basophils Absolute 09/21/2020 0.1  0.0 - 0.1 K/uL Final   Immature Granulocytes 09/21/2020 1  %  Final   Abs Immature Granulocytes 09/21/2020 0.14 (A) 0.00 - 0.07 K/uL Final   Performed at [facility], [place], [place] [address]   Sodium 09/21/2020 130 (A) 135 - 145 mmol/L Final   Potassium 09/21/2020 4.2  3.5 - 5.1 mmol/L Final   Chloride 09/21/2020 99  98 - 111 mmol/L Final   CO2 09/21/2020 23  22 - 32 mmol/L Final   Glucose, Bld 09/21/2020 160 (A) 70 - 99 mg/dL Final   Glucose reference range applies only to samples taken after fasting for at least 8 hours.   BUN 09/21/2020 13  8 - 23 mg/dL Final   Creatinine, Ser 09/21/2020 0.96  0.61 - 1.24 mg/dL Final   Calcium 09/21/2020 9.5  8.9 - 10.3 mg/dL Final   GFR, Estimated 09/21/2020 >60  >60 mL/min Final   Comment: (NOTE) Calculated using the CKD-EPI Creatinine Equation (2021)    Anion gap 09/21/2020 8  5 - 15 Final   Performed at [facility], [place], [place] [address]   Troponin I (High Sensitivity) 09/21/2020 10  <18 ng/L Final   Comment: (NOTE) Elevated high sensitivity troponin I (hsTnI) values and significant  changes across serial measurements may suggest ACS but many other  chronic and acute conditions are known to elevate hsTnI results.  Refer to the  "Links" section for chest pain algorithms and additional  guidance. Performed at [facility] [address] [place], [place] [address]     ECG: Date: 09/21/2020 Time ECG obtained: 0631 AM Rate: 95 bpm Rhythm:  Sinus rhythm Axis (leads I and aVF): Normal Intervals: PR 139 ms. QRS 90 ms. QTc 432 ms. ST segment and T wave changes: No evidence of acute ST segment elevation or depression Comparison: Similar to previous tracing obtained on 09/16/2020   IMAGING / PROCEDURES: PET SCAN INITIAL IMAGING SKULL BASE TO THIGH performed on 10/04/2020 The 7.1 cm left apical mass is homogeneously hypermetabolic, consistent with bronchogenic carcinoma. There are several hypermetabolic pulmonary nodules bilaterally consistent with pulmonary metastases. No mediastinal adenopathy or distant metastases identified. 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 The left apical mass abuts the mediastinum causing encasement of the left subclavian and vertebral arteries and probable involvement of the left vagal and recurrent laryngeal nerves with suggested left vocal cord paralysis. Correlate clinically. No rib destruction identified. Cholelithiasis Aortic atherosclerosis  Emphysema  CT CHEST WITH CONTRAST performed on 09/21/2020 7 cm left upper lobe mass with mediastinal invasion and left subclavian/proximal vertebral encasement. The mass contacts the esophagus, trachea, and thoracic spine without visible invasion of these structures. Subcentimeter pulmonary nodules that are new from 2020 and most likely pulmonary metastases.  TRANSTHORACIC ECHOCARDIOGRAM performed on 05/17/2018 LVEF 55 to 60% Left ventricular systolic function normal Left ventricular cavity size normal No regional wall motion abnormalities Left ventricular diastolic function parameters were normal Mild mitral valve regurgitation mild left atrial dilatation Right ventricular systolic  function normal PASP could not be accurately estimated No pericardial effusion  LEFT HEART CATHETERIZATION AND CORONARY ANGIOGRAPHY performed on 05/16/2018 LVEF 50-55% by visual estimate LVEDP mildly elevated Three-vessel CAD 80% stenosis proximal to mid LCx 50% stenosis distal LM to ostial LAD 80% stenosis mid LAD 75% stenosis distal LAD 75% ostial first diagonal to first diagonal 50% stenosis mid RCA 80% stenosis mid to distal RCA 90% stenosis posterior atrioventricular artery Recommendations: Medical management recommended given culprit vessel unclear.   ASA + clopidogrel + long-acting nitrates +  high-dose statin + beta-blocker + low-dose ACEI if blood pressure tolerates If he continues to have unstable angina despite maximal medical therapy, would consider pharmacologic Myoview to identify region of ischemia, then high risk stenting if possible   Impression and Plan:  Donald Lowery has been referred for pre-anesthesia review and clearance prior to him undergoing the planned anesthetic and procedural courses. Available labs, pertinent testing, and imaging results were personally reviewed by me. This patient has been appropriately cleared by cardiology with an overall ACCEPTABLE risk of significant perioperative cardiovascular complications.  Based on clinical review performed today (10/21/20), barring any significant acute changes in the patient's overall condition, it is anticipated that he will be able to proceed with the planned surgical intervention. Any acute changes in clinical condition may necessitate his procedure being postponed and/or cancelled. Patient will meet with anesthesia team (MD and/or CRNA) on the day of his procedure for preoperative evaluation/assessment. Questions regarding anesthetic course will be fielded at that time.   Pre-surgical instructions were reviewed with the patient during his PAT appointment and questions were fielded by PAT clinical staff. Patient  was advised that if any questions or concerns arise prior to his procedure then he should return a call to PAT and/or his surgeon's office to discuss.  Honor Loh, MSN, APRN, FNP-C, CEN [facility]  Peri-operative Services Nurse Practitioner Phone: [phone] Fax: [phone] 10/21/20 8:49 AM  NOTE: This note has been prepared using Dragon dictation software. Despite my best ability to proofread, there is always the potential that unintentional transcriptional errors may still occur from this process.

## 2020-10-21 ENCOUNTER — Encounter: Payer: Self-pay | Admitting: Pulmonary Disease

## 2020-10-21 ENCOUNTER — Other Ambulatory Visit: Payer: Self-pay

## 2020-10-21 MED ORDER — ICOSAPENT ETHYL 1 G PO CAPS: 2.0000 g | ORAL_CAPSULE | Freq: Two times a day (BID) | ORAL | 6 refills | Status: AC

## 2020-10-21 NOTE — Telephone Encounter (Signed)
*  STAT* If patient is at the pharmacy, call can be transferred to refill team.   1. Which medications need to be refilled? (please list name of each medication and dose if known) Vascepa  2. Which pharmacy/location (including street and city if local pharmacy) is medication to be sent to? [facility] [place]  3. Do they need a 30 day or 90 day supply? [place]

## 2020-10-24 ENCOUNTER — Ambulatory Visit: Payer: Medicare HMO

## 2020-10-24 ENCOUNTER — Other Ambulatory Visit: Payer: Self-pay | Admitting: Oncology

## 2020-10-24 ENCOUNTER — Encounter: Payer: Self-pay | Admitting: Pulmonary Disease

## 2020-10-24 ENCOUNTER — Ambulatory Visit
Admission: RE | Admit: 2020-10-24 | Discharge: 2020-10-24 | Disposition: A | Discharge status: Home / Self Care | Payer: Medicare HMO | Attending: Pulmonary Disease | Admitting: Pulmonary Disease

## 2020-10-24 ENCOUNTER — Encounter
Admission: RE | Disposition: A | Discharge status: Home / Self Care | Payer: Self-pay | Source: Home / Self Care | Attending: Pulmonary Disease

## 2020-10-24 ENCOUNTER — Ambulatory Visit: Payer: Medicare HMO | Admitting: Urgent Care

## 2020-10-24 ENCOUNTER — Other Ambulatory Visit: Payer: Self-pay | Admitting: *Deleted

## 2020-10-24 ENCOUNTER — Other Ambulatory Visit: Payer: Self-pay

## 2020-10-24 DIAGNOSIS — J439 Emphysema, unspecified: Secondary | ICD-10-CM | POA: Diagnosis not present

## 2020-10-24 DIAGNOSIS — Z28311 Partially vaccinated for covid-19: Secondary | ICD-10-CM | POA: Insufficient documentation

## 2020-10-24 DIAGNOSIS — C3412 Malignant neoplasm of upper lobe, left bronchus or lung: Secondary | ICD-10-CM | POA: Diagnosis not present

## 2020-10-24 DIAGNOSIS — Z7982 Long term (current) use of aspirin: Secondary | ICD-10-CM | POA: Diagnosis not present

## 2020-10-24 DIAGNOSIS — Z87891 Personal history of nicotine dependence: Secondary | ICD-10-CM | POA: Diagnosis not present

## 2020-10-24 DIAGNOSIS — Z7902 Long term (current) use of antithrombotics/antiplatelets: Secondary | ICD-10-CM | POA: Insufficient documentation

## 2020-10-24 DIAGNOSIS — Z79899 Other long term (current) drug therapy: Secondary | ICD-10-CM | POA: Diagnosis not present

## 2020-10-24 DIAGNOSIS — C3410 Malignant neoplasm of upper lobe, unspecified bronchus or lung: Secondary | ICD-10-CM | POA: Insufficient documentation

## 2020-10-24 DIAGNOSIS — Z9889 Other specified postprocedural states: Secondary | ICD-10-CM

## 2020-10-24 DIAGNOSIS — C3490 Malignant neoplasm of unspecified part of unspecified bronchus or lung: Secondary | ICD-10-CM

## 2020-10-24 HISTORY — DX: Type 2 diabetes mellitus without complications: E11.9

## 2020-10-24 HISTORY — PX: VIDEO BRONCHOSCOPY WITH ENDOBRONCHIAL NAVIGATION: SHX6175

## 2020-10-24 HISTORY — DX: Other emphysema: J43.8

## 2020-10-24 HISTORY — DX: Fatty (change of) liver, not elsewhere classified: K76.0

## 2020-10-24 HISTORY — DX: Atherosclerosis of aorta: I70.0

## 2020-10-24 LAB — POCT I-STAT, CHEM 8
BUN: 13 mg/dL (ref 8–23)
Calcium, Ion: 1.69 mmol/L (ref 1.15–1.40)
Chloride: 90 mmol/L — ABNORMAL LOW (ref 98–111)
Creatinine, Ser: 0.9 mg/dL (ref 0.61–1.24)
Glucose, Bld: 150 mg/dL — ABNORMAL HIGH (ref 70–99)
HCT: 35 % — ABNORMAL LOW (ref 39.0–52.0)
Hemoglobin: 11.9 g/dL — ABNORMAL LOW (ref 13.0–17.0)
Potassium: 3.9 mmol/L (ref 3.5–5.1)
Sodium: 129 mmol/L — ABNORMAL LOW (ref 135–145)
TCO2: 29 mmol/L (ref 22–32)

## 2020-10-24 SURGERY — VIDEO BRONCHOSCOPY WITH ENDOBRONCHIAL NAVIGATION
Anesthesia: General

## 2020-10-24 MED ORDER — SODIUM CHLORIDE 0.9 % IV SOLN: INTRAVENOUS | Status: DC

## 2020-10-24 MED ORDER — FENTANYL CITRATE (PF) 100 MCG/2ML IJ SOLN
INTRAMUSCULAR | Status: AC
  Filled 2020-10-24: qty 2

## 2020-10-24 MED ORDER — OXYCODONE HCL 5 MG/5ML PO SOLN: 5.0000 mg | Freq: Once | ORAL | Status: DC | PRN

## 2020-10-24 MED ORDER — PROMETHAZINE HCL 25 MG/ML IJ SOLN: 6.2500 mg | INTRAMUSCULAR | Status: DC | PRN

## 2020-10-24 MED ORDER — CHLORHEXIDINE GLUCONATE 0.12 % MT SOLN
OROMUCOSAL | Status: AC
  Administered 2020-10-24: 15 mL via OROMUCOSAL
  Filled 2020-10-24: qty 15

## 2020-10-24 MED ORDER — LIDOCAINE HCL 4 % MT SOLN
OROMUCOSAL | Status: DC | PRN
  Administered 2020-10-24: 4 mL via TOPICAL

## 2020-10-24 MED ORDER — OXYCODONE HCL 10 MG PO TABS: 10.0000 mg | ORAL_TABLET | ORAL | 0 refills | Status: AC | PRN

## 2020-10-24 MED ORDER — ROCURONIUM BROMIDE 100 MG/10ML IV SOLN
INTRAVENOUS | Status: DC | PRN
  Administered 2020-10-24: 40 mg via INTRAVENOUS

## 2020-10-24 MED ORDER — FAMOTIDINE 20 MG PO TABS: 20.0000 mg | ORAL_TABLET | Freq: Once | ORAL | Status: AC

## 2020-10-24 MED ORDER — FENTANYL CITRATE (PF) 100 MCG/2ML IJ SOLN
INTRAMUSCULAR | Status: DC | PRN
  Administered 2020-10-24 (×2): 50 ug via INTRAVENOUS

## 2020-10-24 MED ORDER — FAMOTIDINE 20 MG PO TABS
ORAL_TABLET | ORAL | Status: AC
  Administered 2020-10-24: 20 mg via ORAL
  Filled 2020-10-24: qty 1

## 2020-10-24 MED ORDER — LIDOCAINE HCL (PF) 2 % IJ SOLN
INTRAMUSCULAR | Status: AC
  Filled 2020-10-24: qty 2

## 2020-10-24 MED ORDER — DEXAMETHASONE SODIUM PHOSPHATE 10 MG/ML IJ SOLN
INTRAMUSCULAR | Status: AC
  Filled 2020-10-24: qty 1

## 2020-10-24 MED ORDER — ORAL CARE MOUTH RINSE: 15.0000 mL | Freq: Once | OROMUCOSAL | Status: AC

## 2020-10-24 MED ORDER — PROPOFOL 10 MG/ML IV BOLUS
INTRAVENOUS | Status: DC | PRN
  Administered 2020-10-24: 100 mg via INTRAVENOUS

## 2020-10-24 MED ORDER — ONDANSETRON HCL 4 MG/2ML IJ SOLN
INTRAMUSCULAR | Status: AC
  Filled 2020-10-24: qty 2

## 2020-10-24 MED ORDER — PROPOFOL 10 MG/ML IV BOLUS
INTRAVENOUS | Status: AC
  Filled 2020-10-24: qty 20

## 2020-10-24 MED ORDER — CEFAZOLIN SODIUM-DEXTROSE 2-4 GM/100ML-% IV SOLN
2.0000 g | INTRAVENOUS | Status: AC
  Administered 2020-10-24: 2 g via INTRAVENOUS

## 2020-10-24 MED ORDER — SODIUM CHLORIDE 0.9 % IV SOLN: Freq: Once | INTRAVENOUS | Status: DC

## 2020-10-24 MED ORDER — LIDOCAINE HCL (CARDIAC) PF 100 MG/5ML IV SOSY
PREFILLED_SYRINGE | INTRAVENOUS | Status: DC | PRN
  Administered 2020-10-24: 400 mg via INTRAVENOUS

## 2020-10-24 MED ORDER — FENTANYL CITRATE (PF) 100 MCG/2ML IJ SOLN: 25.0000 ug | INTRAMUSCULAR | Status: DC | PRN

## 2020-10-24 MED ORDER — INDOCYANINE GREEN 25 MG IV SOLR
7.5000 mg | Freq: Once | INTRAVENOUS | Status: DC
  Filled 2020-10-24: qty 10

## 2020-10-24 MED ORDER — PHENYLEPHRINE HCL (PRESSORS) 10 MG/ML IV SOLN
INTRAVENOUS | Status: DC | PRN
  Administered 2020-10-24 (×5): 100 ug via INTRAVENOUS

## 2020-10-24 MED ORDER — DEXAMETHASONE SODIUM PHOSPHATE 10 MG/ML IJ SOLN
INTRAMUSCULAR | Status: DC | PRN
  Administered 2020-10-24: 10 mg via INTRAVENOUS

## 2020-10-24 MED ORDER — ROCURONIUM BROMIDE 10 MG/ML (PF) SYRINGE
PREFILLED_SYRINGE | INTRAVENOUS | Status: AC
  Filled 2020-10-24: qty 10

## 2020-10-24 MED ORDER — CEFAZOLIN SODIUM-DEXTROSE 2-4 GM/100ML-% IV SOLN
INTRAVENOUS | Status: AC
  Filled 2020-10-24: qty 100

## 2020-10-24 MED ORDER — MEPERIDINE HCL 25 MG/ML IJ SOLN: 6.2500 mg | INTRAMUSCULAR | Status: DC | PRN

## 2020-10-24 MED ORDER — SUGAMMADEX SODIUM 200 MG/2ML IV SOLN
INTRAVENOUS | Status: DC | PRN
  Administered 2020-10-24: 130 mg via INTRAVENOUS

## 2020-10-24 MED ORDER — ONDANSETRON HCL 4 MG/2ML IJ SOLN
INTRAMUSCULAR | Status: DC | PRN
  Administered 2020-10-24: 4 mg via INTRAVENOUS

## 2020-10-24 MED ORDER — OXYCODONE HCL 5 MG PO TABS
5.0000 mg | ORAL_TABLET | Freq: Once | ORAL | Status: DC | PRN
Start: 2020-10-24 — End: 2020-10-24

## 2020-10-24 MED ORDER — CHLORHEXIDINE GLUCONATE 0.12 % MT SOLN: 15.0000 mL | Freq: Once | OROMUCOSAL | Status: AC

## 2020-10-24 MED ORDER — GABAPENTIN 300 MG PO CAPS: 300.0000 mg | ORAL_CAPSULE | Freq: Three times a day (TID) | ORAL | 0 refills | Status: DC

## 2020-10-24 NOTE — Anesthesia Procedure Notes (Signed)
Procedure Name: Intubation Date/Time: 10/24/2020 12:49 PM Performed by: Jonna Clark, CRNA Pre-anesthesia Checklist: Patient identified, Patient being monitored, Timeout performed, Emergency Drugs available and Suction available Patient Re-evaluated:Patient Re-evaluated prior to induction Oxygen Delivery Method: Circle system utilized Preoxygenation: Pre-oxygenation with 100% oxygen Induction Type: IV induction Ventilation: Mask ventilation without difficulty Laryngoscope Size: 4 and McGraph Grade View: Grade I Tube type: Oral Tube size: 8.5 mm Number of attempts: 1 Airway Equipment and Method: Stylet Placement Confirmation: ETT inserted through vocal cords under direct vision, positive ETCO2 and breath sounds checked- equal and bilateral Secured at: 21 cm Tube secured with: Tape Dental Injury: Teeth and Oropharynx as per pre-operative assessment

## 2020-10-24 NOTE — Interval H&P Note (Signed)
History and Physical Interval Note:  10/24/2020 12:20 PM  Donald Lowery  has presented today for surgery, with the diagnosis of LUNG MASS.  The various methods of treatment have been discussed with the patient and family. After consideration of risks, benefits and other options for treatment, the patient has consented to  Procedure(s): ROBOTIC [place] (N/A) as a surgical intervention.  The patient's history has been reviewed, patient examined, no change in status, stable for surgery.  I have reviewed the patient's chart and labs.  Questions were answered to the patient's satisfaction.     Vernard Gambles

## 2020-10-24 NOTE — Discharge Instructions (Addendum)
You may start Plavix again in the morning 6/28.  AMBULATORY SURGERY  DISCHARGE INSTRUCTIONS   The drugs that you were given will stay in your system until tomorrow so for the next 24 hours you should not:  Drive an automobile Make any legal decisions Drink any alcoholic beverage   You may resume regular meals tomorrow.  Today it is better to start with liquids and gradually work up to solid foods.  You may eat anything you prefer, but it is better to start with liquids, then soup and crackers, and gradually work up to solid foods.   Please notify your doctor immediately if you have any unusual bleeding, trouble breathing, redness and pain at the surgery site, drainage, fever, or pain not relieved by medication.    Additional Instructions:     Please contact your physician with any problems or Same Day Surgery at [phone], Monday through Friday 6 am to 4 pm, or [place] at [facility] number at [phone].

## 2020-10-24 NOTE — Op Note (Signed)
Robotic Assisted Navigation Bronchoscopy Cellvizio probe based confocal laser endomicroscopy (pCLE)   Indication: Pancoast tumor left upper lobe  Preoperative Diagnosis: Pancoast tumor left upper lobe Post Procedure Diagnosis: Pancoast tumor left upper lobe, malignant Consent: Verbal/Written  Benefits, limitations and potential complications of the procedure were discussed with the patient/family  including, but not limited to bleeding, hemoptysis, respiratory failure requiring intubation and/or prolongued mechanical ventilation, infection, pneumothorax (collapse of lung) requiring chest tube placement, stroke or even death.  Type of Anesthesia: General endotracheal  Surgeon: Renold Don, MD Assistant/Scrub: Sullivan Lone, RRT Circulator: Liborio Nixon, RRT Anesthesiologist/CRNA: Emmie Niemann, MD/Janice Rise Patience, CRNA Cytotechnology: [place] Radiology technologist: Waverly Ferrari, RTR [facility]arch Representative: Edwena Felty   Robotics description: Procedure consists of robotic navigation comprised of electromagnetics, optical pattern recognition and robotic kinematic data - to triangulate bronchoscope location during the procedure and provide accurate positional data to biopsy a lesion.  Description of Procedure: The patient was taken to Procedure Room 2 (Bronchoscopy Suite) in the OR area.  He was inducted under general anesthesia by the anesthesia team.  The patient was inducted under general anesthesia and intubated an 8.5 ET tube without difficulty.  A Portex adapter was placed over the ET tube flange.  The Olympus video bronchoscope was then advanced into the airway for full anatomic tour.  The endobronchial tube was approximately 4 cm above the carina.  The distal trachea appeared unremarkable. The main carina was sharp.  Patient had mild to moderate secretions seen in right and left mainstem bronchi. These secretions were cleared with lavage and suction. The RUL, RLL, RML  appeared to be free of endobronchial masses, lesions, or purulent secretions.  There were however tenacious mucoid secretions that were lavaged and suctioned till clear.  At this point the bronchoscope was brought to the left mainstem bronchus and advanced.  There was thickening of the apical subsegment of the left upper lobe bronchus with some heme noted from the most lateral aspect.  The lingula bronchus and lower lobe bronchi where free of lesions with only minimal secretions noted that were lavaged till clear.  At this point a cytology brush was advanced in the lateral subsegment of the apical left upper lobe bronchus and ROSE was performed.  The area bled briskly after brushing.  This was controlled with 1-10,000 solution of epinephrine of 3 mL.  A second brushing was performed but again the area bled very briskly.  Another 3 mL of epinephrine was then instilled with good hemostasis achieved.  ROSE was consistent with malignancy.  At this point it was elected to use the [facility] robotic unit to try to sample a different bronchus.  This was necessary due to the need for more tissue.  At this point he Olympus video bronchoscope was retrieved and the ET tube was placed in proper position for robotic bronchoscope insertion.  The tube was cut to size and taped in a midline position and the tube holder and adapters were placed on the flange of the ET tube.  The [facility]arch robotic bronchoscope was then advanced via the ET tube.  Registration was completed with good correlation between robotic mapping and bronchoscopic imaging.  With the assistance of fused navigation the bronchoscope was advanced to the more medial segment of the left upper lobe.  This segment did not have anatomic abnormalities.  The tip of the working channel sat within 18 mm of the mass.  Once target was acquired, positioning was confirmed with Cellvizio probe based confocal laser endomicroscopy (pCLE)  colonoscopy.  The biotics bronchoscope was then  anchored in a position which was most favorable to obtain biopsies.  Endomicroscopy revealed areas of tumor with no adjacent inflammation or necrosis.  Brushings were performed x2 ROSE confirmed malignant cells.  Hemostasis was excellent at this site.  At this point transbronchial biopsies were performed x8.  ROSE on first biopsy specimen was positive for malignancy.  Once this was completed targeted bronchoalveolar lavage was performed on the left upper lobe, 40 mL of saline instilled yielding approximately 8 mL of aliquot.  Aliquot was placed in preservative for cytology evaluation.  The bronchoscope was then withdrawn slowly to evaluate hemostasis.  Hemostasis was noted to be excellent.  Bronchoscope was then fully withdrawn and the procedure was terminated.  The patient was allowed to emerge from general anesthesia and was transferred to the PACU in satisfactory condition.  The patient did not complain of chest pain after procedure. Vitals signs stable when transported to recovery.  Auscultation of the lungs was unchanged from prebronchoscopy exam with symmetrical lung sounds. Postprocedure chest x-ray showed no pneumothorax.  Intraprocedural images:         Specimens Obtained:  Transbronchial Forceps Biopsy: X 8 Targeted BAL: 6 mL aliquot Transbronchial Brush: X 2 non guided, X 2 guided  Fluoroscopy:  Fluoroscopy was utilized during the course of this procedure to assure that biopsies were taken in a safe manner under fluoroscopic guidance with spot films as required.  Total fluoroscopy time 1 minute 13 seconds.  Complications:None  Postprocedure chest x-ray shows no pneumothorax, persistent opacity in the left upper lobe consistent with Pancoast tumor:    Estimated Blood Loss: approx. 5 mL   Assessment and Plan/Additional Comments: Pancoast tumor of the left upper lobe Status post successful biopsy of the left upper lobe mass Await final path Patient tolerated procedure  well Patient discharged home without sequela   C. Derrill Kay, MD [facility]   *This note was dictated using voice recognition software/Dragon.  Despite best efforts to proofread, errors can occur which can change the meaning.  Any change was purely unintentional.

## 2020-10-24 NOTE — Anesthesia Preprocedure Evaluation (Signed)
Anesthesia Evaluation  Patient identified by MRN, date of birth, ID band Patient awake    Reviewed: Allergy & Precautions, NPO status , Patient's Chart, lab work & pertinent test results  History of Anesthesia Complications Negative for: history of anesthetic complications  Airway Mallampati: III  TM Distance: >3 FB Neck ROM: Full    Dental  (+) Edentulous Upper, Edentulous Lower   Pulmonary neg sleep apnea, COPD, former smoker,    breath sounds clear to auscultation- rhonchi (-) wheezing      Cardiovascular hypertension, Pt. on medications + CAD and + Past MI  (-) Cardiac Stents and (-) CABG  Rhythm:Regular Rate:Normal - Systolic murmurs and - Diastolic murmurs    Neuro/Psych neg Seizures negative neurological ROS  negative psych ROS   GI/Hepatic negative GI ROS, Neg liver ROS,   Endo/Other  diabetes, Oral Hypoglycemic Agents  Renal/GU negative Renal ROS     Musculoskeletal negative musculoskeletal ROS (+)   Abdominal (+) - obese,   Peds  Hematology negative hematology ROS (+)   Anesthesia Other Findings Past Medical History: No date: Aortic atherosclerosis ([place]) 09/21/2020: Bochdalek hernia     Comment:  fatty No date: Coronary artery disease     Comment:  a. 04/2018 NSTEMI/Cath: LM nl, LAD 50p, 71m/d               diffuse-small caliber, LCX heavily Ca2+ sev prox/mid dzs,              RCA 90 diff distal dzs into RPL and RPDA. EF 50-55%-->Med              Rx. No date: Hepatic steatosis 10/12/2020: History of 2019 novel coronavirus disease (COVID-19) No date: History of echocardiogram     Comment:  a. 04/2018 Echo: EF 55-60%, no rwma, mild MR, mildly dil               LA. Nl RV fxn. No date: Hyperlipidemia No date: Hypertension 05/15/2018: NSTEMI (non-ST elevated myocardial infarction) ([place]) 09/21/2020: 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[place])     Comment:  a.) 7 cm LUL mass with left subclavian/proximal                vertebral encasement No date: Paraseptal emphysema (H[facility]) No date: T2DM (type 2 diabetes mellitus) ([place]) No date: Tobacco abuse     Comment:  a. Quit 04/2018.   Reproductive/Obstetrics                             Anesthesia Physical Anesthesia Plan  ASA: 3  Anesthesia Plan: General   Post-op Pain Management:    Induction: Intravenous  PONV Risk Score and Plan: 1 and Ondansetron and Dexamethasone  Airway Management Planned: Oral ETT  Additional Equipment:   Intra-op Plan:   Post-operative Plan: Extubation in OR  Informed Consent: I have reviewed the patients History and Physical, chart, labs and discussed the procedure including the risks, benefits and alternatives for the proposed anesthesia with the patient or authorized representative who has indicated his/her understanding and acceptance.     Dental advisory given  Plan Discussed with: CRNA and Anesthesiologist  Anesthesia Plan Comments:         Anesthesia Quick Evaluation

## 2020-10-24 NOTE — Anesthesia Postprocedure Evaluation (Signed)
Anesthesia Post Note  Patient: Donald Lowery  Procedure(s) Performed: ROBOTIC ASSISTED VIDEO BRONCHOSCOPY WITH ENDOBRONCHIAL NAVIGATION  Patient location during evaluation: PACU Anesthesia Type: General Level of consciousness: awake and alert and oriented Pain management: pain level controlled Vital Signs Assessment: post-procedure vital signs reviewed and stable Respiratory status: spontaneous breathing, nonlabored ventilation and respiratory function stable Cardiovascular status: blood pressure returned to baseline and stable Postop Assessment: no signs of nausea or vomiting Anesthetic complications: no   No notable events documented.   Last Vitals:  Vitals:   10/24/20 1425 10/24/20 1430  BP:  (!) 151/80  Pulse: 73 84  Resp: (!) 6 18  Temp:  36.4 C  SpO2: 98% 99%    Last Pain:  Vitals:   10/24/20 1430  TempSrc:   PainSc: 0-No pain                 Lillieann Pavlich

## 2020-10-24 NOTE — Transfer of Care (Signed)
Immediate Anesthesia Transfer of Care Note  Patient: Donald Lowery  Procedure(s) Performed: ROBOTIC ASSISTED VIDEO BRONCHOSCOPY WITH ENDOBRONCHIAL NAVIGATION  Patient Location: PACU  Anesthesia Type:General  Level of Consciousness: drowsy and patient cooperative  Airway & Oxygen Therapy: Patient Spontanous Breathing and Patient connected to face mask oxygen  Post-op Assessment: Report given to RN and Post -op Vital signs reviewed and stable  Post vital signs: Reviewed and stable  Last Vitals:  Vitals Value Taken Time  BP 153/89 10/24/20 1407  Temp    Pulse 70 10/24/20 1408  Resp 15 10/24/20 1408  SpO2 100 % 10/24/20 1408  Vitals shown include unvalidated device data.  Last Pain:  Vitals:   10/24/20 1113  TempSrc: Oral  PainSc: 10-Worst pain ever      Patients Stated Pain Goal: 0 (78/24/23 5361)  Complications: No notable events documented.

## 2020-10-25 ENCOUNTER — Encounter: Payer: Self-pay | Admitting: Pulmonary Disease

## 2020-10-25 ENCOUNTER — Other Ambulatory Visit: Payer: Self-pay

## 2020-10-25 NOTE — Telephone Encounter (Signed)
PA started through covermymeds  Donald Lowery Key: [id number] PA Case ID: [id number] Rx #: [id number]  After sending the PA through it stated "Canceled by Provider"

## 2020-10-26 LAB — SURGICAL PATHOLOGY

## 2020-10-26 LAB — CYTOLOGY - NON PAP

## 2020-10-26 NOTE — Progress Notes (Signed)
Patient on schedule for Port placement 10/28/2020, called and spoke with patient's wife with pre procedure instructions given. Made aware to be here @ 0930, NPO after MN prior to procedure, hold am Metformin day of procedure and driver post procedure/recovery/discharge. Stated understanding.

## 2020-10-27 ENCOUNTER — Inpatient Hospital Stay: Payer: Medicare HMO | Attending: Oncology | Admitting: Oncology

## 2020-10-27 ENCOUNTER — Encounter: Payer: Self-pay | Admitting: *Deleted

## 2020-10-27 ENCOUNTER — Encounter: Payer: Self-pay | Admitting: Oncology

## 2020-10-27 ENCOUNTER — Other Ambulatory Visit: Payer: Self-pay

## 2020-10-27 ENCOUNTER — Ambulatory Visit
Admission: RE | Admit: 2020-10-27 | Discharge: 2020-10-27 | Disposition: A | Discharge status: Home / Self Care | Payer: Medicare HMO | Source: Ambulatory Visit | Attending: Radiation Oncology | Admitting: Radiation Oncology

## 2020-10-27 ENCOUNTER — Other Ambulatory Visit: Payer: Self-pay | Admitting: Radiology

## 2020-10-27 VITALS — BP 117/65 | HR 108 | Temp 96.4°F | Resp 16 | Wt 133.6 lb

## 2020-10-27 DIAGNOSIS — Z8616 Personal history of COVID-19: Secondary | ICD-10-CM | POA: Diagnosis not present

## 2020-10-27 DIAGNOSIS — E871 Hypo-osmolality and hyponatremia: Secondary | ICD-10-CM | POA: Insufficient documentation

## 2020-10-27 DIAGNOSIS — R634 Abnormal weight loss: Secondary | ICD-10-CM | POA: Diagnosis not present

## 2020-10-27 DIAGNOSIS — C3490 Malignant neoplasm of unspecified part of unspecified bronchus or lung: Secondary | ICD-10-CM

## 2020-10-27 DIAGNOSIS — I10 Essential (primary) hypertension: Secondary | ICD-10-CM | POA: Insufficient documentation

## 2020-10-27 DIAGNOSIS — I252 Old myocardial infarction: Secondary | ICD-10-CM | POA: Insufficient documentation

## 2020-10-27 DIAGNOSIS — E119 Type 2 diabetes mellitus without complications: Secondary | ICD-10-CM | POA: Insufficient documentation

## 2020-10-27 DIAGNOSIS — Z7982 Long term (current) use of aspirin: Secondary | ICD-10-CM | POA: Diagnosis not present

## 2020-10-27 DIAGNOSIS — R918 Other nonspecific abnormal finding of lung field: Secondary | ICD-10-CM | POA: Diagnosis not present

## 2020-10-27 DIAGNOSIS — I2510 Atherosclerotic heart disease of native coronary artery without angina pectoris: Secondary | ICD-10-CM | POA: Diagnosis not present

## 2020-10-27 DIAGNOSIS — C3412 Malignant neoplasm of upper lobe, left bronchus or lung: Secondary | ICD-10-CM | POA: Insufficient documentation

## 2020-10-27 DIAGNOSIS — Z87891 Personal history of nicotine dependence: Secondary | ICD-10-CM | POA: Diagnosis not present

## 2020-10-27 DIAGNOSIS — E785 Hyperlipidemia, unspecified: Secondary | ICD-10-CM | POA: Diagnosis not present

## 2020-10-27 DIAGNOSIS — Z7984 Long term (current) use of oral hypoglycemic drugs: Secondary | ICD-10-CM | POA: Diagnosis not present

## 2020-10-27 DIAGNOSIS — Z79899 Other long term (current) drug therapy: Secondary | ICD-10-CM | POA: Diagnosis not present

## 2020-10-27 DIAGNOSIS — J3801 Paralysis of vocal cords and larynx, unilateral: Secondary | ICD-10-CM | POA: Diagnosis not present

## 2020-10-27 DIAGNOSIS — M25512 Pain in left shoulder: Secondary | ICD-10-CM | POA: Insufficient documentation

## 2020-10-27 DIAGNOSIS — K760 Fatty (change of) liver, not elsewhere classified: Secondary | ICD-10-CM | POA: Diagnosis not present

## 2020-10-27 DIAGNOSIS — C3492 Malignant neoplasm of unspecified part of left bronchus or lung: Secondary | ICD-10-CM | POA: Diagnosis not present

## 2020-10-27 DIAGNOSIS — R490 Dysphonia: Secondary | ICD-10-CM | POA: Insufficient documentation

## 2020-10-27 DIAGNOSIS — K469 Unspecified abdominal hernia without obstruction or gangrene: Secondary | ICD-10-CM | POA: Diagnosis not present

## 2020-10-27 NOTE — Consult Note (Signed)
NEW PATIENT EVALUATION  Name: Donald Lowery  MRN: [id number]  Date:   10/27/2020     DOB: 1950-09-30   This 70 y.o. 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   PREVIOUS INVESTIGATIONS:  PET/CT CT scans MRI of brain all reviewed Clinical notes reviewed Cytology and pathology reviewed  HPI: Patient is a 70 year old male who presented with several week history of left shoulder pain.  He present to the emergency room where CT scan revealed a 7 cm left upper lobe mass consistent with malignancy.  PET/CT demonstrated 7.1 left apical mass which was hypermetabolic consistent with primary bronchogenic carcinoma.  There are also some small hypermetabolic pulmonary nodules bilaterally consistent with pulmonary metastasis.  No mediastinal or distant metastatic disease was noted.  This was stage T4 N0 M1 a lesion.  He underwent bronchoscopy which was positive on cytology for malignant cells consistent with squamous cell carcinoma.  He has been referred to medical oncology and recommended for concurrent chemoradiation.  The left apical mass abuts the mediastinal causing encasing the left subclavian and vertebral arteries and a problem probable involvement of the left vagal and recurrent laryngeal nerves.  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  He is seen today by radiation college he is doing fairly well still has consistent significant left shoulder pain.  He is having no dysphagia at this time or significant cough or hemoptysis.  PLANNED TREATMENT REGIMEN: Concurrent  chemoradiation  PAST MEDICAL HISTORY:  has a past medical history of Aortic atherosclerosis ([place]), Bochdalek hernia (09/21/2020), Coronary artery disease, Hepatic steatosis, History of 2019 novel coronavirus disease (COVID-19) (10/12/2020), History of echocardiogram, Hyperlipidemia, Hypertension, NSTEMI (non-ST elevated myocardial infarction) ([place]) (05/15/2018), 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[place]) (09/21/2020), Paraseptal emphysema ([place]), T2DM (type 2 diabetes mellitus) ([place]), and Tobacco abuse.    PAST SURGICAL HISTORY:  Past Surgical History:  Procedure Laterality Date   BRAIN SURGERY     CARDIAC CATHETERIZATION     LEFT HEART CATH AND CORONARY ANGIOGRAPHY N/A 05/16/2018   Procedure: LEFT HEART CATH AND CORONARY ANGIOGRAPHY poss PCI;  Surgeon: [name], MD;  Location: [facility];  Service: Cardiovascular;  Laterality: N/A;   VIDEO BRONCHOSCOPY WITH ENDOBRONCHIAL NAVIGATION N/A 10/24/2020   Procedure: ROBOTIC ASSISTED VIDEO BRONCHOSCOPY WITH ENDOBRONCHIAL NAVIGATION;  Surgeon: Tyler Pita, MD;  Location: [facility];  Service: Pulmonary;  Laterality: N/A;    FAMILY HISTORY: family history includes Diabetes in his brother; Heart Problems in his brother and mother; Heart attack in his father.  SOCIAL HISTORY:  reports that he quit smoking about 2 years ago. His smoking use included cigarettes. He has a 45.00 pack-year smoking history. He has never used smokeless tobacco. He reports that he does not drink alcohol and does not use drugs.  ALLERGIES: Patient has no known allergies.  MEDICATIONS:  Current Outpatient Medications  Medication Sig Dispense Refill   aspirin EC 81 MG EC tablet Take 1 tablet (81 mg total) by mouth daily. 30 tablet 0   atorvastatin (LIPITOR) 80 MG tablet Take 1 tablet (80 mg  total) by mouth daily at 6 PM. 90 tablet 0   dicyclomine (BENTYL) 10 MG capsule Take 1 capsule (10 mg total) by mouth 3 (three) times daily as needed for up to 14 days for spasms.  20 capsule 0   ezetimibe (ZETIA) 10 MG tablet Take 1 tablet (10 mg total) by mouth daily. 90 tablet 0   gabapentin (NEURONTIN) 300 MG capsule Take 1 capsule (300 mg total) by mouth 3 (three) times daily. 90 capsule 0   icosapent Ethyl (VASCEPA) 1 g capsule Take 2 capsules (2 g total) by mouth 2 (two) times daily. 120 capsule 6   isosorbide mononitrate (IMDUR) 30 MG 24 hr tablet Take 1 tablet (30 mg total) by mouth daily. 90 tablet 3   losartan (COZAAR) 25 MG tablet Take 1 tablet by mouth once daily 90 tablet 2   Menthol-Methyl Salicylate (ICY HOT) 61-60 % STCK Apply 1 application topically daily as needed (shoulder pain).     metFORMIN (GLUCOPHAGE) 500 MG tablet Take 500 mg by mouth every morning.     nitroGLYCERIN (NITROSTAT) 0.4 MG SL tablet Place 1 tablet (0.4 mg total) under the tongue every 5 (five) minutes as needed for chest pain. 25 tablet 3   Oxycodone HCl 10 MG TABS Take 1 tablet (10 mg total) by mouth every 4 (four) hours as needed for up to 10 days. 60 tablet 0   vitamin B-12 (CYANOCOBALAMIN) 1000 MCG tablet Take 1,000 mcg by mouth daily.     No current facility-administered medications for this encounter.    ECOG PERFORMANCE STATUS:  1 - Symptomatic but completely ambulatory  REVIEW OF SYSTEMS: Patient denies any weight loss, fatigue, weakness, fever, chills or night sweats. Patient denies any loss of vision, blurred vision. Patient denies any ringing  of the ears or hearing loss. No irregular heartbeat. Patient denies heart murmur or history of fainting. Patient denies any chest pain or pain radiating to her upper extremities. Patient denies any shortness of breath, difficulty breathing at night, cough or hemoptysis. Patient denies any swelling in the lower legs. Patient denies any nausea vomiting, vomiting of blood, or coffee ground material in the vomitus. Patient denies any stomach pain. Patient states has had normal bowel movements no significant constipation or diarrhea. Patient  denies any dysuria, hematuria or significant nocturia. Patient denies any problems walking, swelling in the joints or loss of balance. Patient denies any skin changes, loss of hair or loss of weight. Patient denies any excessive worrying or anxiety or significant depression. Patient denies any problems with insomnia. Patient denies excessive thirst, polyuria, polydipsia. Patient denies any swollen glands, patient denies easy bruising or easy bleeding. Patient denies any recent infections, allergies or URI. Patient "s visual fields have not changed significantly in recent time.   PHYSICAL EXAM: There were no vitals taken for this visit. Range of motion of the left left upper extremity does not elicit pain.  Well-developed well-nourished patient in NAD. HEENT reveals PERLA, EOMI, discs not visualized.  Oral cavity is clear. No oral mucosal lesions are identified. Neck is clear without evidence of cervical or supraclavicular adenopathy. Lungs are clear to A&P. Cardiac examination is essentially unremarkable with regular rate and rhythm without murmur rub or thrill. Abdomen is benign with no organomegaly or masses noted. Motor sensory and DTR levels are equal and symmetric in the upper and lower extremities. Cranial nerves II through XII are grossly intact. Proprioception is intact. No peripheral adenopathy or edema is identified. No motor or sensory  levels are noted. Crude visual fields are within normal range.  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  I believe radiation will be beneficial to decrease his left shoulder pain prevent further atelectasis of his lung causing hemoptysis and loss of pulmonary function.  He also has significant significant compression on his pulmonary vessels which also may be alleviated.  Risks and benefits of radiation including production of cough fatigue alteration of blood counts possible skin reaction possible radiation esophagitis all were reviewed in detail with the patient and his wife.  They both seem to comprehend my treatment plan well.  I have personally set up and ordered CT simulation for early next week.  There will be extra effort by both professional staff as well as technical staff to coordinate and manage concurrent chemoradiation and ensuing side effects during his treatments.   I would like to take this opportunity to thank you for allowing me to participate in the care of your patient.Noreene Filbert, MD

## 2020-10-27 NOTE — Progress Notes (Signed)
Patient has a wt loss of 8 lbs since last documented wt on 6/27.  Does have a decrease in appetite.

## 2020-10-27 NOTE — Progress Notes (Signed)
Met with patient during follow up visit with Dr. Grayland Ormond to discuss recent pathology results and treatment options. All questions answered during visit. Pt given resources regarding diagnosis and supportive services. Reviewed upcoming appts and informed will be given print out prior to leaving the clinic. Contact info given and instructed to call with any questions or needs. Pt and his wife verbalized understanding.

## 2020-10-27 NOTE — Progress Notes (Signed)
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  INTERVAL HISTORY: Patient returns to clinic today to discuss his biopsy and imaging results as well as treatment planning.  He is having increasing pain in his left shoulder and now has developed hoarseness of voice. He has no neurologic complaints.  He denies any recent fevers or illnesses.  He has a good appetite and denies weight loss.  He denies any chest pain, shortness of breath, cough, or hemoptysis.  He denies any abdominal pain.  He has no nausea, vomiting, constipation, or diarrhea.  He has no urinary complaints.  Patient offers no further specific complaints today.  REVIEW OF SYSTEMS:   Review of Systems  Constitutional: Negative.  Negative for fever, malaise/fatigue and weight loss.  Respiratory: Negative.  Negative for cough, hemoptysis and shortness of breath.   Cardiovascular: Negative.  Negative for chest pain and leg swelling.  Gastrointestinal:  Negative for abdominal pain.  Genitourinary: Negative.  Negative for dysuria.  Musculoskeletal:  Positive for joint pain.  Skin: Negative.  Negative for rash.  Neurological: Negative.  Negative for dizziness, focal weakness, weakness and headaches.   As per HPI. Otherwise, a complete review of systems is negative.  PAST MEDICAL HISTORY: Past Medical History:  Diagnosis Date   Aortic atherosclerosis ([place])    Bochdalek hernia 09/21/2020   fatty   Coronary artery disease    a. 04/2018 NSTEMI/Cath: LM nl, LAD 50p, 89m/d diffuse-small caliber, LCX heavily Ca2+ sev prox/mid dzs, RCA 90 diff distal dzs into RPL and RPDA. EF 50-55%-->Med  Rx.   Hepatic steatosis    History of 2019 novel coronavirus disease (COVID-19) 10/12/2020   History of echocardiogram    a. 04/2018 Echo: EF 55-60%, no rwma, mild MR, mildly dil LA. Nl RV fxn.   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 Quit 04/2018.    PAST SURGICAL HISTORY: Past Surgical History:  Procedure Laterality Date   BRAIN SURGERY     CARDIAC CATHETERIZATION     IR IMAGING GUIDED PORT INSERTION  10/28/2020   LEFT HEART CATH AND CORONARY ANGIOGRAPHY N/A 05/16/2018   Procedure: LEFT HEART CATH AND CORONARY ANGIOGRAPHY poss PCI;  Surgeon: [name], MD;  Location: [facility];  Service: Cardiovascular;  Laterality: N/A;   VIDEO BRONCHOSCOPY WITH ENDOBRONCHIAL NAVIGATION N/A 10/24/2020   Procedure: ROBOTIC ASSISTED VIDEO BRONCHOSCOPY WITH ENDOBRONCHIAL NAVIGATION;  Surgeon: Tyler Pita, MD;  Location: [facility];  Service: Pulmonary;  Laterality: N/A;    FAMILY HISTORY: Family History  Problem Relation Age of Onset   Heart Problems Mother    Heart attack Father    Diabetes Brother    Heart Problems Brother     ADVANCED DIRECTIVES (Y/N):  N  HEALTH MAINTENANCE: Social History   Tobacco Use   Smoking status: Former    Packs/day: 1.00    Years: 45.00  Pack years: 45.00    Types: Cigarettes    Quit date: 05/15/2018    Years since quitting: 2.4   Smokeless tobacco: Never  Vaping Use   Vaping Use: Never used  Substance Use Topics   Alcohol use: Never   Drug use: Never     Colonoscopy:  PAP:  Bone density:  Lipid panel:  No Known Allergies  Current Outpatient Medications  Medication Sig Dispense Refill   aspirin EC 81 MG EC tablet Take 1 tablet (81 mg total) by mouth  daily. 30 tablet 0   atorvastatin (LIPITOR) 80 MG tablet Take 1 tablet (80 mg total) by mouth daily at 6 PM. 90 tablet 0   ezetimibe (ZETIA) 10 MG tablet Take 1 tablet (10 mg total) by mouth daily. 90 tablet 0   gabapentin (NEURONTIN) 300 MG capsule Take 1 capsule (300 mg total) by mouth 3 (three) times daily. 90 capsule 0   icosapent Ethyl (VASCEPA) 1 g capsule Take 2 capsules (2 g total) by mouth 2 (two) times daily. 120 capsule 6   isosorbide mononitrate (IMDUR) 30 MG 24 hr tablet Take 1 tablet (30 mg total) by mouth daily. 90 tablet 3   losartan (COZAAR) 25 MG tablet Take 1 tablet by mouth once daily 90 tablet 2   Menthol-Methyl Salicylate (ICY HOT) 96-22 % STCK Apply 1 application topically daily as needed (shoulder pain).     metFORMIN (GLUCOPHAGE) 500 MG tablet Take 500 mg by mouth every morning.     nitroGLYCERIN (NITROSTAT) 0.4 MG SL tablet Place 1 tablet (0.4 mg total) under the tongue every 5 (five) minutes as needed for chest pain. (Patient not taking: Reported on 10/28/2020) 25 tablet 3   Oxycodone HCl 10 MG TABS Take 1 tablet (10 mg total) by mouth every 4 (four) hours as needed for up to 10 days. 60 tablet 0   vitamin B-12 (CYANOCOBALAMIN) 1000 MCG tablet Take 1,000 mcg by mouth daily.     dicyclomine (BENTYL) 10 MG capsule Take 1 capsule (10 mg total) by mouth 3 (three) times daily as needed for up to 14 days for spasms. 20 capsule 0   lidocaine-prilocaine (EMLA) cream Apply to affected area once 30 g 3   ondansetron (ZOFRAN) 8 MG tablet Take 1 tablet (8 mg total) by mouth 2 (two) times daily as needed for refractory nausea / vomiting. 60 tablet 1   prochlorperazine (COMPAZINE) 10 MG tablet Take 1 tablet (10 mg total) by mouth every 6 (six) hours as needed (Nausea or vomiting). 60 tablet 1   No current facility-administered medications for this visit.   Facility-Administered Medications Ordered in Other Visits  Medication Dose Route Frequency Provider Last Rate Last Admin    fentaNYL (SUBLIMAZE) 100 MCG/2ML injection            heparin lock flush 100 UNIT/ML injection            midazolam (VERSED) 2 MG/2ML injection             OBJECTIVE: Vitals:   10/27/20 1009  BP: 117/65  Pulse: (!) 108  Resp: 16  Temp: (!) 96.4 F (35.8 C)     Body mass index is 21.56 kg/m.    ECOG FS:1 - Symptomatic but completely ambulatory  General: Well-developed, well-nourished, no acute distress. Eyes: Pink conjunctiva, anicteric sclera. HEENT: Normocephalic, moist mucous membranes. Lungs: No audible wheezing or coughing. Heart: Regular rate and rhythm. Abdomen: Soft, nontender, no obvious distention. Musculoskeletal: No edema, cyanosis, or clubbing.  Neuro: Alert, answering all questions appropriately. Cranial nerves grossly intact. Skin: No rashes or petechiae noted. Psych: Normal affect.   LAB RESULTS:  Lab Results  Component Value Date   NA 129 (L) 10/24/2020   K 3.9 10/24/2020   CL 90 (L) 10/24/2020   CO2 23 09/21/2020   GLUCOSE 150 (H) 10/24/2020   BUN 13 10/24/2020   CREATININE 0.90 10/24/2020   CALCIUM 9.5 09/21/2020   PROT 7.0 09/16/2020   ALBUMIN 3.5 09/16/2020   AST 18 09/16/2020   ALT 14 09/16/2020   ALKPHOS 96 09/16/2020   BILITOT 0.8 09/16/2020   GFRNONAA >60 09/21/2020   GFRAA >60 07/14/2019    Lab Results  Component Value Date   WBC 12.3 (H) 09/21/2020   NEUTROABS 9.5 (H) 09/21/2020   HGB 11.9 (L) 10/24/2020   HCT 35.0 (L) 10/24/2020   MCV 84.8 09/21/2020   PLT 329 09/21/2020     STUDIES: MR Brain W Wo Contrast  Result Date: 10/09/2020 CLINICAL DATA:  Lung cancer, staging EXAM: MRI HEAD WITHOUT AND WITH CONTRAST TECHNIQUE: Multiplanar, multiecho pulse sequences of the brain and surrounding structures were obtained without and with intravenous contrast. CONTRAST:  34mL GADAVIST GADOBUTROL 1 MMOL/ML IV SOLN COMPARISON:  None. FINDINGS: Brain: There is a small subcentimeter focus of diffusion hyperintensity with ADC isointensity and  some mild hypointensity within the right centrum semiovale. No corresponding enhancement or susceptibility. No definite mass identified. No abnormal enhancement. No evidence of intracranial hemorrhage. Prominence of the ventricles and sulci reflects mild generalized parenchymal volume loss. Patchy foci of T2 hyperintensity in the supratentorial white matter are nonspecific but may reflect mild chronic microvascular ischemic changes. There is no hydrocephalus or extra-axial collection. Vascular: Major vessel flow voids at the skull base are preserved. Skull and upper cervical spine: Normal marrow signal is preserved. Sinuses/Orbits: Trace mucosal thickening.  Orbits are unremarkable. Other: Sella is unremarkable.  Mastoid air cells are clear. IMPRESSION: A subcentimeter focus of abnormal diffusion signal in the deep right frontal white matter likely reflects late acute to subacute infarct. There is no associated enhancement to suggest metastasis. No evidence of intracranial metastatic disease. These results will be called to the ordering clinician or representative by the Radiologist Assistant, and communication documented in the PACS or [employer]. Electronically Signed   By: Macy Mis M.D.   On: 10/08/2020 18:57   NM PET Image Initial (PI) Skull Base To Thigh  Result Date: 10/05/2020 CLINICAL DATA:  Initial treatment strategy for left upper lobe lung mass, presumed bronchogenic carcinoma. Back and left shoulder pain. EXAM: NUCLEAR MEDICINE PET SKULL BASE TO THIGH TECHNIQUE: 7.94 mCi F-18 FDG was injected intravenously. Full-ring PET imaging was performed from the skull base to thigh after the radiotracer. CT data was obtained and used for attenuation correction and anatomic localization. Fasting blood glucose: 127 mg/dl COMPARISON:  Chest CT 09/21/2020. Abdominal CT 09/17/2020 and chest CT 02/10/2019. FINDINGS: Mediastinal blood pool activity: SUV max 2.3 NECK: No hypermetabolic cervical lymph nodes  are identified.There are no lesions of the pharyngeal mucosal space. 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 No focal mucosal lesion identified on CT images. Incidental CT findings: Bilateral carotid atherosclerosis. Sequela of prior right occipital craniotomy. CHEST: The previously demonstrated large left apical mass is homogeneously hypermetabolic with an SUV max of 16.0. This measures approximately 7.1 x 6.0 cm on image 76/3. As noted previously, this mass contacts the aortic arch and encases the  proximal left subclavian and vertebral arteries. Probable superior mediastinal fat invasion, but no apparent involvement of the trachea or esophagus. There is no osseous erosion. There are no hypermetabolic mediastinal, hilar or axillary lymph nodes. However, there are several hypermetabolic right lung nodules. The largest nodules include a 9 mm right upper lobe nodule on image 81/3 (SUV max 3.8), a 1.1 cm nodule in the superior segment of the right lower lobe on image 98/3 (SUV max 6.8) and a 1.1 cm right middle lobe nodule on image 115/3 (SUV max 2.3). There are additional small pulmonary nodules bilaterally. Incidental CT findings: Atherosclerosis of the aorta, great vessels and coronary arteries. Underlying mild to moderate paraseptal emphysema with a small left pleural effusion. ABDOMEN/PELVIS: There is no hypermetabolic activity within the liver, adrenal glands, spleen or pancreas. There is no hypermetabolic nodal activity. Incidental CT findings: Calcified gallstones. Aortic and branch vessel atherosclerosis. SKELETON: There is no hypermetabolic activity to suggest osseous metastatic disease. No left apical rib destruction or abnormal metabolic activity. Incidental CT findings: Inferior right occipital craniotomy. IMPRESSION: 1. The 7.1 cm left apical mass is homogeneously hypermetabolic, consistent with bronchogenic carcinoma. There are  several hypermetabolic pulmonary nodules bilaterally consistent with pulmonary metastases. No mediastinal adenopathy or distant metastases identified. By imaging, findings are consistent with stage IVA bronchogenic carcinoma ([id number]). 2. The left apical mass abuts the mediastinum causing encasement of the left subclavian and vertebral arteries and probable involvement of the left vagal and recurrent laryngeal nerves with suggested left vocal cord paralysis. Correlate clinically. No rib destruction identified. 3. Cholelithiasis. 4. Aortic Atherosclerosis (ICD10-I70.0) and Emphysema (ICD10-J43.9). Electronically Signed   By: Richardean Sale M.D.   On: 10/05/2020 09:49   DG Chest Port 1 View  Result Date: 10/24/2020 CLINICAL DATA:  Left upper lobe lung biopsy. EXAM: PORTABLE CHEST 1 VIEW COMPARISON:  Sep 21, 2020. FINDINGS: Normal cardiac size. Stable appearance of left upper lobe mass. No pneumothorax or pleural effusion is noted. Several probable pulmonary nodules are noted. Bony thorax is unremarkable. IMPRESSION: No pneumothorax status post left upper lobe lung biopsy. Electronically Signed   By: Marijo Conception M.D.   On: 10/24/2020 15:47   DG C-Arm 1-60 Min-No Report  Result Date: 10/24/2020 Fluoroscopy was utilized by the requesting physician.  No radiographic interpretation.   IR IMAGING GUIDED PORT INSERTION  Result Date: 10/28/2020 INDICATION: 71 year old male referred for port catheter placement EXAM: IMAGE GUIDED PORT CATHETER PLACEMENT MEDICATIONS: None ANESTHESIA/SEDATION: Moderate (conscious) sedation was employed during this procedure. A total of Versed 1.0 mg and Fentanyl 50 mcg was administered intravenously. Moderate Sedation Time: 20 minutes. The patient's level of consciousness and vital signs were monitored continuously by radiology nursing throughout the procedure under my direct supervision. FLUOROSCOPY TIME:  Fluoroscopy Time: 0 minutes 6 seconds (0.57 mGy). COMPLICATIONS: None  PROCEDURE: The procedure, risks, benefits, and alternatives were explained to the patient. Questions regarding the procedure were encouraged and answered. The patient understands and consents to the procedure. Ultrasound survey was performed with images stored and sent to PACs. The right neck and chest was prepped with chlorhexidine, and draped in the usual sterile fashion using maximum barrier technique (cap and mask, sterile gown, sterile gloves, large sterile sheet, hand hygiene and cutaneous antiseptic). Local anesthesia was attained by infiltration with 1% lidocaine without epinephrine. Ultrasound demonstrated patency of the right internal jugular vein, and this was documented with an image. Under real-time ultrasound guidance, this vein was accessed with a 21 gauge micropuncture needle and image documentation  was performed. A small dermatotomy was made at the access site with an 11 scalpel. A 0.018" wire was advanced into the SVC and used to estimate the length of the internal catheter. The access needle exchanged for a 30F micropuncture vascular sheath. The 0.018" wire was then removed and a 0.035" wire advanced into the IVC. An appropriate location for the subcutaneous reservoir was selected below the clavicle and an incision was made through the skin and underlying soft tissues. The subcutaneous tissues were then dissected using a combination of blunt and sharp surgical technique and a pocket was formed. A single lumen power injectable portacatheter was then tunneled through the subcutaneous tissues from the pocket to the dermatotomy and the port reservoir placed within the subcutaneous pocket. The venous access site was then serially dilated and a peel away vascular sheath placed over the wire. The wire was removed and the port catheter advanced into position under fluoroscopic guidance. The catheter tip is positioned in the cavoatrial junction. This was documented with a spot image. The portacatheter was  then tested and found to flush and aspirate well. The port was flushed with saline followed by 100 units/mL heparinized saline. The pocket was then closed in two layers using first subdermal inverted interrupted absorbable sutures followed by a running subcuticular suture. The epidermis was then sealed with Dermabond. The dermatotomy at the venous access site was also seal with Dermabond. Patient tolerated the procedure well and remained hemodynamically stable throughout. No complications encountered and no significant blood loss encountered IMPRESSION: Status post right IJ port catheter placement. Signed, Dulcy Fanny. Dellia Nims, RPVI [facility] Electronically Signed   By: Corrie Mckusick D.O.   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  PLAN:    1. 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  PET scan results from October 04, 2020 reviewed independently and reported as above with hypermetabolic bilateral pulmonary nodules without mediastinal or hilar lymphadenopathy.  Although imaging suggest stage IV disease, will treat patient as a stage IIIa.  MRI of the brain on October 08, 2020 was negative for disease.  Patient will have port placement tomorrow.  He has consultation with radiation oncology later today.  Plan to give concurrent chemotherapy with weekly carboplatinum and Taxol along with daily XRT.  Will then reimage and if patient has resolution of bilateral pulmonary nodules will consider maintenance immunotherapy for 1 year.  Return to clinic on November 03, 2020 to initiate cycle 1 of weekly carboplatinum and Taxol.  2.  Shoulder pain: XRT as above.  Radiation oncology as above.  Consult palliative care for further evaluation. 3.  Weight loss: Referral to dietary has been placed. 4.  Hyponatremia: Chronic and unchanged, monitor.   Patient  expressed understanding and was in agreement with this plan. He also understands that He can call clinic at any time with any questions, concerns, or complaints.   Cancer Staging Non-small cell cancer of left lung [facility]) Staging form: Lung, AJCC 8th Edition - Clinical stage from 10/28/2020: Stage IVA (cT4, cN0, pM1a) - Signed by Lloyd Huger, MD on 10/28/2020  Lloyd Huger, MD   10/28/2020 3:19 PM

## 2020-10-28 ENCOUNTER — Other Ambulatory Visit: Payer: Self-pay

## 2020-10-28 ENCOUNTER — Encounter: Payer: Self-pay | Admitting: Oncology

## 2020-10-28 ENCOUNTER — Ambulatory Visit
Admission: RE | Admit: 2020-10-28 | Discharge: 2020-10-28 | Disposition: A | Discharge status: Home / Self Care | Payer: Medicare HMO | Source: Ambulatory Visit | Attending: Oncology | Admitting: Oncology

## 2020-10-28 DIAGNOSIS — Z8616 Personal history of COVID-19: Secondary | ICD-10-CM | POA: Insufficient documentation

## 2020-10-28 DIAGNOSIS — Z87891 Personal history of nicotine dependence: Secondary | ICD-10-CM | POA: Diagnosis not present

## 2020-10-28 DIAGNOSIS — C3490 Malignant neoplasm of unspecified part of unspecified bronchus or lung: Secondary | ICD-10-CM

## 2020-10-28 DIAGNOSIS — I2510 Atherosclerotic heart disease of native coronary artery without angina pectoris: Secondary | ICD-10-CM | POA: Diagnosis not present

## 2020-10-28 DIAGNOSIS — E785 Hyperlipidemia, unspecified: Secondary | ICD-10-CM | POA: Insufficient documentation

## 2020-10-28 DIAGNOSIS — C3492 Malignant neoplasm of unspecified part of left bronchus or lung: Secondary | ICD-10-CM | POA: Insufficient documentation

## 2020-10-28 DIAGNOSIS — Z7982 Long term (current) use of aspirin: Secondary | ICD-10-CM | POA: Diagnosis not present

## 2020-10-28 DIAGNOSIS — I10 Essential (primary) hypertension: Secondary | ICD-10-CM | POA: Diagnosis not present

## 2020-10-28 DIAGNOSIS — E119 Type 2 diabetes mellitus without complications: Secondary | ICD-10-CM | POA: Diagnosis not present

## 2020-10-28 DIAGNOSIS — J449 Chronic obstructive pulmonary disease, unspecified: Secondary | ICD-10-CM | POA: Insufficient documentation

## 2020-10-28 DIAGNOSIS — Z7984 Long term (current) use of oral hypoglycemic drugs: Secondary | ICD-10-CM | POA: Diagnosis not present

## 2020-10-28 DIAGNOSIS — Z79899 Other long term (current) drug therapy: Secondary | ICD-10-CM | POA: Insufficient documentation

## 2020-10-28 HISTORY — PX: IR IMAGING GUIDED PORT INSERTION: IMG5740

## 2020-10-28 LAB — GLUCOSE, CAPILLARY: Glucose-Capillary: 117 mg/dL — ABNORMAL HIGH (ref 70–99)

## 2020-10-28 MED ORDER — HEPARIN SOD (PORK) LOCK FLUSH 100 UNIT/ML IV SOLN
INTRAVENOUS | Status: AC
  Filled 2020-10-28: qty 5

## 2020-10-28 MED ORDER — FENTANYL CITRATE (PF) 100 MCG/2ML IJ SOLN
INTRAMUSCULAR | Status: AC | PRN
  Administered 2020-10-28: 50 ug via INTRAVENOUS

## 2020-10-28 MED ORDER — MIDAZOLAM HCL 2 MG/2ML IJ SOLN
INTRAMUSCULAR | Status: AC | PRN
  Administered 2020-10-28: 1 mg via INTRAVENOUS

## 2020-10-28 MED ORDER — MIDAZOLAM HCL 2 MG/2ML IJ SOLN
INTRAMUSCULAR | Status: AC
  Filled 2020-10-28: qty 2

## 2020-10-28 MED ORDER — LIDOCAINE-PRILOCAINE 2.5-2.5 % EX CREA: TOPICAL_CREAM | CUTANEOUS | 3 refills | Status: DC

## 2020-10-28 MED ORDER — FENTANYL CITRATE (PF) 100 MCG/2ML IJ SOLN
INTRAMUSCULAR | Status: AC
  Filled 2020-10-28: qty 2

## 2020-10-28 MED ORDER — ONDANSETRON HCL 8 MG PO TABS: 8.0000 mg | ORAL_TABLET | Freq: Two times a day (BID) | ORAL | 1 refills | Status: DC | PRN

## 2020-10-28 MED ORDER — PROCHLORPERAZINE MALEATE 10 MG PO TABS: 10.0000 mg | ORAL_TABLET | Freq: Four times a day (QID) | ORAL | 1 refills | Status: DC | PRN

## 2020-10-28 NOTE — Progress Notes (Signed)
START ON PATHWAY REGIMEN - Non-Small Cell Lung     Administer weekly:     Paclitaxel      Carboplatin   **Always confirm dose/schedule in your pharmacy ordering system**  Patient Characteristics: Preoperative or Nonsurgical Candidate (Clinical Staging), Stage III - Nonsurgical Candidate (Nonsquamous and Squamous), PS = 0, 1 Therapeutic Status: Preoperative or Nonsurgical Candidate (Clinical Staging) AJCC T Category: cT4 AJCC N Category: cN0 AJCC M Category: cM0 AJCC 8 Stage Grouping: IIIA ECOG Performance Status: 0 Intent of Therapy: Non-Curative / Palliative Intent, Discussed with Patient

## 2020-10-28 NOTE — Consult Note (Signed)
Chief Complaint: Patient was seen in consultation today for Port-A-Cath placement  Referring Physician(s): Finnegan,Timothy J  Supervising Physician: Corrie Mckusick  Patient Status: [place] [facility]  History of Present Illness: Donald Lowery is a 70 y.o. male , ex-smoker, with past medical history of coronary artery disease with prior MI, fatty liver, hyperlipidemia, hypertension, COPD, diabetes and newly diagnosed squamous cell carcinoma of the left lung who presents today for Port-A-Cath placement for chemotherapy.  Patient also tested positive for COVID-19 on 10/12/2020.  Past Medical History:  Diagnosis Date   Aortic atherosclerosis ([place])    [place] hernia 09/21/2020   fatty   Coronary artery disease    a. 04/2018 NSTEMI/Cath: LM nl, LAD 50p, 2m/d diffuse-small caliber, LCX heavily Ca2+ sev prox/mid dzs, RCA 90 diff distal dzs into RPL and RPDA. EF 50-55%-->Med Rx.   Hepatic steatosis    History of 2019 novel coronavirus disease (COVID-19) 10/12/2020   History of echocardiogram    a. 04/2018 Echo: EF 55-60%, no rwma, mild MR, mildly dil LA. Nl RV fxn.   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 Quit 04/2018.    Past Surgical History:  Procedure Laterality Date   BRAIN SURGERY     CARDIAC CATHETERIZATION     LEFT HEART CATH AND CORONARY ANGIOGRAPHY N/A 05/16/2018   Procedure: LEFT HEART CATH AND CORONARY ANGIOGRAPHY poss PCI;  Surgeon: [name], MD;  Location: [facility];  Service: Cardiovascular;  Laterality: N/A;   VIDEO BRONCHOSCOPY WITH ENDOBRONCHIAL NAVIGATION N/A 10/24/2020   Procedure: ROBOTIC ASSISTED VIDEO BRONCHOSCOPY WITH ENDOBRONCHIAL NAVIGATION;  Surgeon: Tyler Pita, MD;   Location: [facility];  Service: Pulmonary;  Laterality: N/A;    Allergies: Patient has no known allergies.  Medications: Prior to Admission medications   Medication Sig Start Date End Date Taking? Authorizing Provider  aspirin EC 81 MG EC tablet Take 1 tablet (81 mg total) by mouth daily. 05/17/18  Yes Hillary Bow, MD  atorvastatin (LIPITOR) 80 MG tablet Take 1 tablet (80 mg total) by mouth daily at 6 PM. 09/27/20  Yes Gollan, Kathlene November, MD  ezetimibe (ZETIA) 10 MG tablet Take 1 tablet (10 mg total) by mouth daily. 10/06/20 01/04/21 Yes Gollan, Kathlene November, MD  gabapentin (NEURONTIN) 300 MG capsule Take 1 capsule (300 mg total) by mouth 3 (three) times daily. 10/24/20 11/23/20 Yes Tyler Pita, MD  icosapent Ethyl (VASCEPA) 1 g capsule Take 2 capsules (2 g total) by mouth 2 (two) times daily. 10/21/20 11/20/20 Yes Gollan, Kathlene November, MD  isosorbide mononitrate (IMDUR) 30 MG 24 hr tablet Take 1 tablet (30 mg total) by mouth daily. 10/12/19  Yes [name], MD  losartan (COZAAR) 25 MG tablet Take 1 tablet by mouth once daily 02/03/20  Yes Gollan, Kathlene November, MD  Menthol-Methyl Salicylate (ICY HOT) 03-54 % STCK Apply 1 application topically daily as needed (shoulder pain).   Yes [provider]  metFORMIN (GLUCOPHAGE) 500 MG tablet Take 500 mg by mouth every morning. 08/27/20  Yes [provider]  Oxycodone HCl 10 MG TABS Take 1 tablet (10 mg total) by mouth every 4 (four) hours as needed for up to 10 days. 10/24/20  11/03/20 Yes Tyler Pita, MD  vitamin B-12 (CYANOCOBALAMIN) 1000 MCG tablet Take 1,000 mcg by mouth daily.   Yes [provider]  dicyclomine (BENTYL) 10 MG capsule Take 1 capsule (10 mg total) by mouth 3 (three) times daily as needed for up to 14 days for spasms. 09/17/20 10/18/20  Harvest Dark, MD  nitroGLYCERIN (NITROSTAT) 0.4 MG SL tablet Place 1 tablet (0.4 mg total) under the tongue every 5 (five) minutes as needed for chest pain. Patient not  taking: Reported on 10/28/2020 06/05/18 07/09/28  Theora Gianotti, NP     Family History  Problem Relation Age of Onset   Heart Problems Mother    Heart attack Father    Diabetes Brother    Heart Problems Brother     Social History   Socioeconomic History   Marital status: Married    Spouse name: Sunday Spillers   Number of children: Not on file   Years of education: Not on file   Highest education level: Not on file  Occupational History   Not on file  Tobacco Use   Smoking status: Former    Packs/day: 1.00    Years: 45.00    Pack years: 45.00    Types: Cigarettes    Quit date: 05/15/2018    Years since quitting: 2.4   Smokeless tobacco: Never  Vaping Use   Vaping Use: Never used  Substance and Sexual Activity   Alcohol use: Never   Drug use: Never   Sexual activity: Not on file  Other Topics Concern   Not on file  Social History Narrative   Traivon Morrical- Wife   Had a son that passed away ( shot)    Social Determinants of Health   Financial Resource Strain: Not on file  Food Insecurity: Not on file  Transportation Needs: Not on file  Physical Activity: Not on file  Stress: Not on file  Social Connections: Not on file      Review of Systems currently denies fever, headache, dyspnea, cough, abdominal pain, back pain, nausea, vomiting or bleeding.  He does have left arm/shoulder discomfort  Vital Signs: BP (!) 152/79   Pulse (!) 112   Temp 97.9 F (36.6 C) (Oral)   Resp 18   Ht 5\' 6"  (1.676 m)   Wt 133 lb 9.6 oz (60.6 kg)   SpO2 97%   BMI 21.56 kg/m   Physical Exam awake, alert.  Chest with distant breath sounds bilaterally.  Heart with tachycardic but regular rhythm.  Abdomen soft, positive bowel sounds, nontender.  No lower extremity edema.  Imaging: MR Brain W Wo Contrast  Result Date: 10/09/2020 CLINICAL DATA:  Lung cancer, staging EXAM: MRI HEAD WITHOUT AND WITH CONTRAST TECHNIQUE: Multiplanar, multiecho pulse sequences of the brain and  surrounding structures were obtained without and with intravenous contrast. CONTRAST:  43mL GADAVIST GADOBUTROL 1 MMOL/ML IV SOLN COMPARISON:  None. FINDINGS: Brain: There is a small subcentimeter focus of diffusion hyperintensity with ADC isointensity and some mild hypointensity within the right centrum semiovale. No corresponding enhancement or susceptibility. No definite mass identified. No abnormal enhancement. No evidence of intracranial hemorrhage. Prominence of the ventricles and sulci reflects mild generalized parenchymal volume loss. Patchy foci of T2 hyperintensity in the supratentorial white matter are nonspecific but may reflect mild chronic microvascular ischemic changes. There is no hydrocephalus or extra-axial collection. Vascular: Major vessel flow voids at the skull base are preserved. Skull and upper cervical spine: Normal marrow signal is preserved. Sinuses/Orbits: Trace  mucosal thickening.  Orbits are unremarkable. Other: Sella is unremarkable.  Mastoid air cells are clear. IMPRESSION: A subcentimeter focus of abnormal diffusion signal in the deep right frontal white matter likely reflects late acute to subacute infarct. There is no associated enhancement to suggest metastasis. No evidence of intracranial metastatic disease. These results will be called to the ordering clinician or representative by the Radiologist Assistant, and communication documented in the PACS or [employer]. Electronically Signed   By: Macy Mis M.D.   On: 10/08/2020 18:57   NM PET Image Initial (PI) Skull Base To Thigh  Result Date: 10/05/2020 CLINICAL DATA:  Initial treatment strategy for left upper lobe lung mass, presumed bronchogenic carcinoma. Back and left shoulder pain. EXAM: NUCLEAR MEDICINE PET SKULL BASE TO THIGH TECHNIQUE: 7.94 mCi F-18 FDG was injected intravenously. Full-ring PET imaging was performed from the skull base to thigh after the radiotracer. CT data was obtained and used for attenuation  correction and anatomic localization. Fasting blood glucose: 127 mg/dl COMPARISON:  Chest CT 09/21/2020. Abdominal CT 09/17/2020 and chest CT 02/10/2019. FINDINGS: Mediastinal blood pool activity: SUV max 2.3 NECK: No hypermetabolic cervical lymph nodes are identified.There are no lesions of the pharyngeal mucosal space. 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 No focal mucosal lesion identified on CT images. Incidental CT findings: Bilateral carotid atherosclerosis. Sequela of prior right occipital craniotomy. CHEST: The previously demonstrated large left apical mass is homogeneously hypermetabolic with an SUV max of 16.0. This measures approximately 7.1 x 6.0 cm on image 76/3. As noted previously, this mass contacts the aortic arch and encases the proximal left subclavian and vertebral arteries. Probable superior mediastinal fat invasion, but no apparent involvement of the trachea or esophagus. There is no osseous erosion. There are no hypermetabolic mediastinal, hilar or axillary lymph nodes. However, there are several hypermetabolic right lung nodules. The largest nodules include a 9 mm right upper lobe nodule on image 81/3 (SUV max 3.8), a 1.1 cm nodule in the superior segment of the right lower lobe on image 98/3 (SUV max 6.8) and a 1.1 cm right middle lobe nodule on image 115/3 (SUV max 2.3). There are additional small pulmonary nodules bilaterally. Incidental CT findings: Atherosclerosis of the aorta, great vessels and coronary arteries. Underlying mild to moderate paraseptal emphysema with a small left pleural effusion. ABDOMEN/PELVIS: There is no hypermetabolic activity within the liver, adrenal glands, spleen or pancreas. There is no hypermetabolic nodal activity. Incidental CT findings: Calcified gallstones. Aortic and branch vessel atherosclerosis. SKELETON: There is no hypermetabolic activity to suggest osseous metastatic disease.  No left apical rib destruction or abnormal metabolic activity. Incidental CT findings: Inferior right occipital craniotomy. IMPRESSION: 1. The 7.1 cm left apical mass is homogeneously hypermetabolic, consistent with bronchogenic carcinoma. There are several hypermetabolic pulmonary nodules bilaterally consistent with pulmonary metastases. No mediastinal adenopathy or distant metastases identified. By imaging, findings are consistent with stage IVA bronchogenic carcinoma ([id number]). 2. The left apical mass abuts the mediastinum causing encasement of the left subclavian and vertebral arteries and probable involvement of the left vagal and recurrent laryngeal nerves with suggested left vocal cord paralysis. Correlate clinically. No rib destruction identified. 3. Cholelithiasis. 4. Aortic Atherosclerosis (ICD10-I70.0) and Emphysema (ICD10-J43.9). Electronically Signed   By: Richardean Sale M.D.   On: 10/05/2020 09:49   DG Chest Port 1 View  Result Date: 10/24/2020 CLINICAL DATA:  Left upper lobe lung biopsy. EXAM: PORTABLE CHEST 1 VIEW COMPARISON:  Sep 21, 2020. FINDINGS: Normal cardiac size. Stable appearance of left upper lobe mass. No pneumothorax or pleural effusion is noted. Several probable pulmonary nodules are noted. Bony thorax is unremarkable. IMPRESSION: No pneumothorax status post left upper lobe lung biopsy. Electronically Signed   By: Marijo Conception M.D.   On: 10/24/2020 15:47   DG C-Arm 1-60 Min-No Report  Result Date: 10/24/2020 Fluoroscopy was utilized by the requesting physician.  No radiographic interpretation.    Labs:  CBC: Recent Labs    09/16/20 2023 09/21/20 0635 10/24/20 1121  WBC 13.1* 12.3*  --   HGB 10.9* 11.2* 11.9*  HCT 31.9* 32.9* 35.0*  PLT 324 329  --     COAGS: No results for input(s): INR, APTT in the last 8760 hours.  BMP: Recent Labs    09/16/20 2023 09/21/20 0635 10/24/20 1121  NA 131* 130* 129*  K 4.3 4.2 3.9  CL 95* 99 90*  CO2 27 23  --    GLUCOSE 156* 160* 150*  BUN 16 13 13   CALCIUM 9.9 9.5  --   CREATININE 0.96 0.96 0.90  GFRNONAA >60 >60  --     LIVER FUNCTION TESTS: Recent Labs    09/16/20 2023  BILITOT 0.8  AST 18  ALT 14  ALKPHOS 96  PROT 7.0  ALBUMIN 3.5    TUMOR MARKERS: No results for input(s): AFPTM, CEA, CA199, CHROMGRNA in the last 8760 hours.  Assessment and Plan: 70 y.o. male , ex-smoker, with past medical history of coronary artery disease with prior MI, fatty liver, hyperlipidemia, hypertension, COPD, diabetes and newly diagnosed squamous cell carcinoma of the left lung who presents today for Port-A-Cath placement for chemotherapy.  Patient also tested positive for COVID-19 on 10/12/2020.Risks and benefits of image guided port-a-catheter placement was discussed with the patient including, but not limited to bleeding, infection, pneumothorax, or fibrin sheath development and need for additional procedures.  All of the patient's questions were answered, patient is agreeable to proceed. Consent signed and in chart.    Thank you for this interesting consult.  I greatly enjoyed meeting MILAD BUBLITZ and look forward to participating in their care.  A copy of this report was sent to the requesting provider on this date.  Electronically Signed: D. Rowe Robert, PA-C 10/28/2020, 10:21 AM   I spent a total of 25 minutes    in face to face in clinical consultation, greater than 50% of which was counseling/coordinating care for Port-A-Cath placement

## 2020-10-28 NOTE — Procedures (Signed)
Interventional Radiology Procedure Note  Procedure: Placement of a right IJ approach single lumen PowerPort.  Tip is positioned at the superior cavoatrial junction and catheter is ready for immediate use.  Complications: None Recommendations:  - Ok to shower tomorrow - Do not submerge for 7 days - Routine line care   Signed,  Mats Jeanlouis S. Charmika Macdonnell, DO   

## 2020-11-01 ENCOUNTER — Other Ambulatory Visit: Payer: Self-pay

## 2020-11-01 ENCOUNTER — Ambulatory Visit: Payer: Medicare HMO | Admitting: Radiation Oncology

## 2020-11-01 ENCOUNTER — Inpatient Hospital Stay: Payer: Medicare HMO | Admitting: Oncology

## 2020-11-01 ENCOUNTER — Ambulatory Visit: Payer: Medicare HMO | Admitting: Oncology

## 2020-11-01 ENCOUNTER — Inpatient Hospital Stay: Payer: Medicare HMO | Attending: Oncology

## 2020-11-01 DIAGNOSIS — D649 Anemia, unspecified: Secondary | ICD-10-CM | POA: Insufficient documentation

## 2020-11-01 DIAGNOSIS — E871 Hypo-osmolality and hyponatremia: Secondary | ICD-10-CM | POA: Insufficient documentation

## 2020-11-01 DIAGNOSIS — Z5111 Encounter for antineoplastic chemotherapy: Secondary | ICD-10-CM | POA: Insufficient documentation

## 2020-11-01 DIAGNOSIS — C3412 Malignant neoplasm of upper lobe, left bronchus or lung: Secondary | ICD-10-CM | POA: Insufficient documentation

## 2020-11-02 ENCOUNTER — Ambulatory Visit
Admission: RE | Admit: 2020-11-02 | Discharge: 2020-11-02 | Disposition: A | Discharge status: Home / Self Care | Payer: Medicare HMO | Source: Ambulatory Visit | Attending: Radiation Oncology | Admitting: Radiation Oncology

## 2020-11-02 ENCOUNTER — Other Ambulatory Visit: Payer: Self-pay

## 2020-11-02 ENCOUNTER — Telehealth: Payer: Self-pay

## 2020-11-02 DIAGNOSIS — Z510 Encounter for antineoplastic radiation therapy: Secondary | ICD-10-CM | POA: Insufficient documentation

## 2020-11-02 DIAGNOSIS — C3412 Malignant neoplasm of upper lobe, left bronchus or lung: Secondary | ICD-10-CM | POA: Insufficient documentation

## 2020-11-02 DIAGNOSIS — C3490 Malignant neoplasm of unspecified part of unspecified bronchus or lung: Secondary | ICD-10-CM

## 2020-11-02 NOTE — Telephone Encounter (Signed)
Research nurse called patient's home phone number to review the [facility] protocol for which the patient is eligible to participate. Message left for him to return call at his earliest convenience to discuss his interest.  Jeral Fruit, RN 11/02/20 9:10 AM   Patient and spouse returned call to research nurse. Research nurse reviewed the protocol consent / hipaa briefly with both. The patient states he wants to participate in the protocol. Research nurse asked patient to come in about 15-30 minutes prior to his visit in the am on 11/03/20 to review and sign the protocol ICF. The patient stated he would be here at that time. He is aware that his blood will be drawn at the same time as his other labs to avoid any unnecessary peripheral sticks and prior to any treatment with chemotherapy for his diagnosed lung cancer.  Jeral Fruit, RN 11/02/20 9:43 AM

## 2020-11-03 ENCOUNTER — Encounter: Payer: Self-pay | Admitting: *Deleted

## 2020-11-03 ENCOUNTER — Inpatient Hospital Stay: Payer: Medicare HMO

## 2020-11-03 ENCOUNTER — Inpatient Hospital Stay (HOSPITAL_BASED_OUTPATIENT_CLINIC_OR_DEPARTMENT_OTHER): Payer: Medicare HMO | Admitting: Hospice and Palliative Medicine

## 2020-11-03 ENCOUNTER — Other Ambulatory Visit: Payer: Self-pay

## 2020-11-03 VITALS — BP 106/57 | HR 82 | Temp 97.3°F | Resp 16 | Wt 130.2 lb

## 2020-11-03 DIAGNOSIS — G893 Neoplasm related pain (acute) (chronic): Secondary | ICD-10-CM | POA: Diagnosis not present

## 2020-11-03 DIAGNOSIS — C3492 Malignant neoplasm of unspecified part of left bronchus or lung: Secondary | ICD-10-CM

## 2020-11-03 DIAGNOSIS — Z515 Encounter for palliative care: Secondary | ICD-10-CM

## 2020-11-03 DIAGNOSIS — D649 Anemia, unspecified: Secondary | ICD-10-CM | POA: Diagnosis not present

## 2020-11-03 DIAGNOSIS — C3412 Malignant neoplasm of upper lobe, left bronchus or lung: Secondary | ICD-10-CM | POA: Diagnosis present

## 2020-11-03 DIAGNOSIS — E871 Hypo-osmolality and hyponatremia: Secondary | ICD-10-CM | POA: Diagnosis not present

## 2020-11-03 DIAGNOSIS — Z5111 Encounter for antineoplastic chemotherapy: Secondary | ICD-10-CM | POA: Diagnosis not present

## 2020-11-03 LAB — COMPREHENSIVE METABOLIC PANEL
ALT: 20 U/L (ref 0–44)
AST: 23 U/L (ref 15–41)
Albumin: 3 g/dL — ABNORMAL LOW (ref 3.5–5.0)
Alkaline Phosphatase: 100 U/L (ref 38–126)
Anion gap: 9 (ref 5–15)
BUN: 23 mg/dL (ref 8–23)
CO2: 28 mmol/L (ref 22–32)
Calcium: 12 mg/dL — ABNORMAL HIGH (ref 8.9–10.3)
Chloride: 88 mmol/L — ABNORMAL LOW (ref 98–111)
Creatinine, Ser: 1.09 mg/dL (ref 0.61–1.24)
GFR, Estimated: >60 mL/min (ref >60)
Glucose, Bld: 196 mg/dL — ABNORMAL HIGH (ref 70–99)
Potassium: 4.1 mmol/L (ref 3.5–5.1)
Sodium: 125 mmol/L — ABNORMAL LOW (ref 135–145)
Total Bilirubin: 0.8 mg/dL (ref 0.3–1.2)
Total Protein: 7.2 g/dL (ref 6.5–8.1)

## 2020-11-03 LAB — CBC WITH DIFFERENTIAL/PLATELET
Abs Immature Granulocytes: 0.26 10*3/uL — ABNORMAL HIGH (ref 0.00–0.07)
Basophils Absolute: 0.1 10*3/uL (ref 0.0–0.1)
Basophils Relative: 0 %
Eosinophils Absolute: 0 10*3/uL (ref 0.0–0.5)
Eosinophils Relative: 0 %
HCT: 33.9 % — ABNORMAL LOW (ref 39.0–52.0)
Hemoglobin: 11.5 g/dL — ABNORMAL LOW (ref 13.0–17.0)
Immature Granulocytes: 1 %
Lymphocytes Relative: 5 %
Lymphs Abs: 1.2 10*3/uL (ref 0.7–4.0)
MCH: 28.5 pg (ref 26.0–34.0)
MCHC: 33.9 g/dL (ref 30.0–36.0)
MCV: 84.1 fL (ref 80.0–100.0)
Monocytes Absolute: 1.2 10*3/uL — ABNORMAL HIGH (ref 0.1–1.0)
Monocytes Relative: 5 %
Neutro Abs: 21 10*3/uL — ABNORMAL HIGH (ref 1.7–7.7)
Neutrophils Relative %: 89 %
Platelets: 402 10*3/uL — ABNORMAL HIGH (ref 150–400)
RBC: 4.03 MIL/uL — ABNORMAL LOW (ref 4.22–5.81)
RDW: 13.3 % (ref 11.5–15.5)
WBC: 23.7 10*3/uL — ABNORMAL HIGH (ref 4.0–10.5)
nRBC: 0 % (ref 0.0–0.2)

## 2020-11-03 MED ORDER — SODIUM CHLORIDE 0.9 % IV SOLN
45.0000 mg/m2 | Freq: Once | INTRAVENOUS | Status: AC
  Administered 2020-11-03: 78 mg via INTRAVENOUS
  Filled 2020-11-03: qty 13

## 2020-11-03 MED ORDER — PALONOSETRON HCL INJECTION 0.25 MG/5ML
0.2500 mg | Freq: Once | INTRAVENOUS | Status: AC
  Administered 2020-11-03: 0.25 mg via INTRAVENOUS
  Filled 2020-11-03: qty 5

## 2020-11-03 MED ORDER — DIPHENHYDRAMINE HCL 50 MG/ML IJ SOLN
25.0000 mg | Freq: Once | INTRAMUSCULAR | Status: AC
  Administered 2020-11-03: 25 mg via INTRAVENOUS
  Filled 2020-11-03: qty 1

## 2020-11-03 MED ORDER — FAMOTIDINE 20 MG IN NS 100 ML IVPB
20.0000 mg | Freq: Once | INTRAVENOUS | Status: AC
  Administered 2020-11-03: 20 mg via INTRAVENOUS
  Filled 2020-11-03: qty 20

## 2020-11-03 MED ORDER — HEPARIN SOD (PORK) LOCK FLUSH 100 UNIT/ML IV SOLN
INTRAVENOUS | Status: AC
  Filled 2020-11-03: qty 5

## 2020-11-03 MED ORDER — SODIUM CHLORIDE 0.9 % IV SOLN
156.6000 mg | Freq: Once | INTRAVENOUS | Status: AC
  Administered 2020-11-03: 160 mg via INTRAVENOUS
  Filled 2020-11-03: qty 16

## 2020-11-03 MED ORDER — SODIUM CHLORIDE 0.9 % IV SOLN
Freq: Once | INTRAVENOUS | Status: AC
  Filled 2020-11-03: qty 250

## 2020-11-03 MED ORDER — SODIUM CHLORIDE 0.9 % IV SOLN
10.0000 mg | Freq: Once | INTRAVENOUS | Status: AC
  Administered 2020-11-03: 10 mg via INTRAVENOUS
  Filled 2020-11-03: qty 10

## 2020-11-03 MED ORDER — HEPARIN SOD (PORK) LOCK FLUSH 100 UNIT/ML IV SOLN
500.0000 [IU] | Freq: Once | INTRAVENOUS | Status: AC | PRN
  Administered 2020-11-03: 500 [IU]
  Filled 2020-11-03: qty 5

## 2020-11-03 NOTE — Progress Notes (Signed)
[place]  Telephone:([phone] [phone]   Name: Donald Lowery Date: 11/03/2020 MRN: [id number]  DOB: 15-Jun-1950  Patient Care Team: [name], NP as PCP - General (Internal Medicine) [name], MD as PCP - Cardiology (Cardiology) Telford Nab, RN as Oncology Nurse Navigator    REASON FOR CONSULTATION: Donald Lowery is a 70 y.o. 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  Patient has had significant left shoulder pain.  PET/CT demonstrated a 7.1 cm left apical mass, which abuts the mediastinum encasing the left subclavian and vertebral arteries and is felt probably to have nerve impingement leading to referred pain.  He also has vocal cord paralysis.  Patient was referred to palliative care to help address goals to manage ongoing symptoms.  SOCIAL HISTORY:     reports that he quit smoking about 2 years ago. His smoking use included cigarettes. He has a 45.00 pack-year smoking history. He has never used smokeless tobacco. He reports that he does not drink alcohol and does not use drugs.  Patient is married and lives at home with his wife.  He had a son who is now deceased.  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  ADVANCE DIRECTIVES:  Not on file  CODE STATUS:   PAST MEDICAL HISTORY: Past Medical History:  Diagnosis Date   Aortic atherosclerosis ([place])    [place] hernia 09/21/2020   fatty   Coronary artery disease    a. 04/2018 NSTEMI/Cath: LM nl, LAD 50p, 47md diffuse-small caliber, LCX heavily Ca2+ sev prox/mid dzs, RCA 90 diff distal dzs into RPL and RPDA. EF 50-55%-->Med Rx.   Hepatic steatosis    History of 2019 novel coronavirus disease (COVID-19) 10/12/2020   History of echocardiogram    a. 04/2018 Echo: EF 55-60%, no rwma, mild MR, mildly dil LA. Nl RV fxn.   Hyperlipidemia     Hypertension    [place]I (non-ST elevated myocardial infarction) [place] 05/[place]   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 09/21[place]   a.) 7 cm LUL mass with left subclavian/proximal vertebral encasement   Paraseptal emphysema ([facility])    T2DM (type 2 diabetes mellitus) [place]    Tobacco abuse    a. Quit 04/2018.    PAST SURGICAL HISTORY:  Past Surgical History:  Procedure Laterality Date   BRAIN SURGERY     CARDIAC CATHETERIZATION     IR IMAGING GUIDED PORT INSERTION  10/28/2020   LEFT HEART CATH AND CORONARY ANGIOGRAPHY N/A 05/16/2018   Procedure: LEFT HEART CATH AND CORONARY ANGIOGRAPHY poss PCI;  Surgeon: GMinna Merritts MD;  [facility];  Service: Cardiovascular;  Laterality: N/A;   VIDEO BRONCHOSCOPY WITH ENDOBRONCHIAL NAVIGATION N/A 10/24/2020   Procedure: ROBOTIC ASSISTED VIDEO BRONCHOSCOPY WITH ENDOBRONCHIAL NAVIGATION;  Surgeon: GTyler Pita MD;  Location: [facility];  Service: Pulmonary;  Laterality: N/A;    HEMATOLOGY/ONCOLOGY HISTORY:  Oncology History  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  09/21/2020 Initial Diagnosis   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    10/28/2020 Cancer Staging   Staging form: Lung, AJCC 8th Edition - Clinical stage from 10/28/2020: Stage IVA (cT4, cN0, pM1a) - Signed by FLloyd Huger MD on 10/28/2020    11/03/2020 -  Chemotherapy    Patient is on Treatment Plan: LUNG CARBOPLATIN / PACLITAXEL + XRT Q7D  ALLERGIES:  has No Known Allergies.  MEDICATIONS:  Current Outpatient Medications  Medication Sig Dispense Refill   aspirin EC 81 MG EC tablet Take 1 tablet (81 mg total) by mouth daily. 30 tablet 0   atorvastatin (LIPITOR) 80 MG tablet Take 1 tablet (80 mg total) by mouth daily at 6 PM. 90 tablet 0   dicyclomine (BENTYL) 10 MG capsule Take 1 capsule (10 mg total) by mouth 3 (three) times daily as needed for up to 14 days for spasms. 20 capsule 0   ezetimibe (ZETIA) 10 MG tablet Take 1 tablet (10 mg total)  by mouth daily. 90 tablet 0   gabapentin (NEURONTIN) 300 MG capsule Take 1 capsule (300 mg total) by mouth 3 (three) times daily. 90 capsule 0   icosapent Ethyl (VASCEPA) 1 g capsule Take 2 capsules (2 g total) by mouth 2 (two) times daily. 120 capsule 6   isosorbide mononitrate (IMDUR) 30 MG 24 hr tablet Take 1 tablet (30 mg total) by mouth daily. 90 tablet 3   lidocaine-prilocaine (EMLA) cream Apply to affected area once 30 g 3   losartan (COZAAR) 25 MG tablet Take 1 tablet by mouth once daily 90 tablet 2   Menthol-Methyl Salicylate (ICY HOT) 20-94 % STCK Apply 1 application topically daily as needed (shoulder pain).     metFORMIN (GLUCOPHAGE) 500 MG tablet Take 500 mg by mouth every morning.     nitroGLYCERIN (NITROSTAT) 0.4 MG SL tablet Place 1 tablet (0.4 mg total) under the tongue every 5 (five) minutes as needed for chest pain. (Patient not taking: Reported on 10/28/2020) 25 tablet 3   ondansetron (ZOFRAN) 8 MG tablet Take 1 tablet (8 mg total) by mouth 2 (two) times daily as needed for refractory nausea / vomiting. 60 tablet 1   Oxycodone HCl 10 MG TABS Take 1 tablet (10 mg total) by mouth every 4 (four) hours as needed for up to 10 days. 60 tablet 0   prochlorperazine (COMPAZINE) 10 MG tablet Take 1 tablet (10 mg total) by mouth every 6 (six) hours as needed (Nausea or vomiting). 60 tablet 1   vitamin B-12 (CYANOCOBALAMIN) 1000 MCG tablet Take 1,000 mcg by mouth daily.     No current facility-administered medications for this visit.   Facility-Administered Medications Ordered in Other Visits  Medication Dose Route Frequency Provider Last Rate Last Admin   CARBOplatin (PARAPLATIN) 160 mg in sodium chloride 0.9 % 100 mL chemo infusion  160 mg Intravenous Once Lloyd Huger, MD       dexamethasone (DECADRON) 10 mg in sodium chloride 0.9 % 50 mL IVPB  10 mg Intravenous Once Lloyd Huger, MD       famotidine (PEPCID) IVPB 20 mg in NS 100 mL IVPB  20 mg Intravenous Once Lloyd Huger, MD       PACLitaxel (TAXOL) 78 mg in sodium chloride 0.9 % 250 mL chemo infusion (</= 21m/m2)  45 mg/m2 (Treatment Plan Recorded) Intravenous Once FLloyd Huger MD        VITAL SIGNS: There were no vitals taken for this visit. There were no vitals filed for this visit.  Estimated body mass index is 21.02 kg/m as calculated from the following:   Height as of 10/28/20: 5' 6" (1.676 m).   Weight as of an earlier encounter on 11/03/20: 130 lb 4 oz (59.1 kg).  LABS: CBC:    Component Value Date/Time   WBC 23.7 (H) 11/03/2020 0904   HGB 11.5 (  L) 11/03/2020 0904   HCT 33.9 (L) 11/03/2020 0904   PLT 402 (H) 11/03/2020 0904   MCV 84.1 11/03/2020 0904   NEUTROABS 21.0 (H) 11/03/2020 0904   LYMPHSABS 1.2 11/03/2020 0904   MONOABS 1.2 (H) 11/03/2020 0904   EOSABS 0.0 11/03/2020 0904   BASOSABS 0.1 11/03/2020 0904   Comprehensive Metabolic Panel:    Component Value Date/Time   NA 125 (L) 11/03/2020 0904   NA 138 12/16/2018 0933   K 4.1 11/03/2020 0904   CL 88 (L) 11/03/2020 0904   CO2 28 11/03/2020 0904   BUN 23 11/03/2020 0904   BUN 14 12/16/2018 0933   CREATININE 1.09 11/03/2020 0904   GLUCOSE 196 (H) 11/03/2020 0904   CALCIUM 12.0 (H) 11/03/2020 0904   AST 23 11/03/2020 0904   ALT 20 11/03/2020 0904   ALKPHOS 100 11/03/2020 0904   BILITOT 0.8 11/03/2020 0904   PROT 7.2 11/03/2020 0904   ALBUMIN 3.0 (L) 11/03/2020 0904    RADIOGRAPHIC STUDIES: MR Brain W Wo Contrast  Result Date: 10/09/2020 CLINICAL DATA:  Lung cancer, staging EXAM: MRI HEAD WITHOUT AND WITH CONTRAST TECHNIQUE: Multiplanar, multiecho pulse sequences of the brain and surrounding structures were obtained without and with intravenous contrast. CONTRAST:  62m GADAVIST GADOBUTROL 1 MMOL/ML IV SOLN COMPARISON:  None. FINDINGS: Brain: There is a small subcentimeter focus of diffusion hyperintensity with ADC isointensity and some mild hypointensity within the right centrum semiovale. No corresponding  enhancement or susceptibility. No definite mass identified. No abnormal enhancement. No evidence of intracranial hemorrhage. Prominence of the ventricles and sulci reflects mild generalized parenchymal volume loss. Patchy foci of T2 hyperintensity in the supratentorial white matter are nonspecific but may reflect mild chronic microvascular ischemic changes. There is no hydrocephalus or extra-axial collection. Vascular: Major vessel flow voids at the skull base are preserved. Skull and upper cervical spine: Normal marrow signal is preserved. Sinuses/Orbits: Trace mucosal thickening.  Orbits are unremarkable. Other: Sella is unremarkable.  Mastoid air cells are clear. IMPRESSION: A subcentimeter focus of abnormal diffusion signal in the deep right frontal white matter likely reflects late acute to subacute infarct. There is no associated enhancement to suggest metastasis. No evidence of intracranial metastatic disease. These results will be called to the ordering clinician or representative by the Radiologist Assistant, and communication documented in the PACS or [employer] Electronically Signed   By: PMacy MisM.D.   On: 10/08/2020 18:57   NM PET Image Initial (PI) Skull Base To Thigh  Result Date: 10/05/2020 CLINICAL DATA:  Initial treatment strategy for left upper lobe lung mass, presumed bronchogenic carcinoma. Back and left shoulder pain. EXAM: NUCLEAR MEDICINE PET SKULL BASE TO THIGH TECHNIQUE: 7.94 mCi F-18 FDG was injected intravenously. Full-ring PET imaging was performed from the skull base to thigh after the radiotracer. CT data was obtained and used for attenuation correction and anatomic localization. Fasting blood glucose: 127 mg/dl COMPARISON:  Chest CT 09/21/2020. Abdominal CT 09/17/2020 and chest CT 02/10/2019. FINDINGS: Mediastinal blood pool activity: SUV max 2.3 NECK: No hypermetabolic cervical lymph nodes are identified.There are no lesions of the pharyngeal mucosal space.  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 No focal mucosal lesion identified on CT images. Incidental CT findings: Bilateral carotid atherosclerosis. Sequela of prior right occipital craniotomy. CHEST: The previously demonstrated large left apical mass is homogeneously hypermetabolic with an SUV max of 16.0. This measures approximately 7.1 x 6.0 cm on image 76/3. As  noted previously, this mass contacts the aortic arch and encases the proximal left subclavian and vertebral arteries. Probable superior mediastinal fat invasion, but no apparent involvement of the trachea or esophagus. There is no osseous erosion. There are no hypermetabolic mediastinal, hilar or axillary lymph nodes. However, there are several hypermetabolic right lung nodules. The largest nodules include a 9 mm right upper lobe nodule on image 81/3 (SUV max 3.8), a 1.1 cm nodule in the superior segment of the right lower lobe on image 98/3 (SUV max 6.8) and a 1.1 cm right middle lobe nodule on image 115/3 (SUV max 2.3). There are additional small pulmonary nodules bilaterally. Incidental CT findings: Atherosclerosis of the aorta, great vessels and coronary arteries. Underlying mild to moderate paraseptal emphysema with a small left pleural effusion. ABDOMEN/PELVIS: There is no hypermetabolic activity within the liver, adrenal glands, spleen or pancreas. There is no hypermetabolic nodal activity. Incidental CT findings: Calcified gallstones. Aortic and branch vessel atherosclerosis. SKELETON: There is no hypermetabolic activity to suggest osseous metastatic disease. No left apical rib destruction or abnormal metabolic activity. Incidental CT findings: Inferior right occipital craniotomy. IMPRESSION: 1. The 7.1 cm left apical mass is homogeneously hypermetabolic, consistent with bronchogenic carcinoma. There are several hypermetabolic pulmonary nodules bilaterally consistent with  pulmonary metastases. No mediastinal adenopathy or distant metastases identified. By imaging, findings are consistent with stage IVA bronchogenic carcinoma ([id number]). 2. The left apical mass abuts the mediastinum causing encasement of the left subclavian and vertebral arteries and probable involvement of the left vagal and recurrent laryngeal nerves with suggested left vocal cord paralysis. Correlate clinically. No rib destruction identified. 3. Cholelithiasis. 4. Aortic Atherosclerosis (ICD10-I70.0) and Emphysema (ICD10-J43.9). Electronically Signed   By: Richardean Sale M.D.   On: 10/05/2020 09:49   DG Chest Port 1 View  Result Date: 10/24/2020 CLINICAL DATA:  Left upper lobe lung biopsy. EXAM: PORTABLE CHEST 1 VIEW COMPARISON:  Sep 21, 2020. FINDINGS: Normal cardiac size. Stable appearance of left upper lobe mass. No pneumothorax or pleural effusion is noted. Several probable pulmonary nodules are noted. Bony thorax is unremarkable. IMPRESSION: No pneumothorax status post left upper lobe lung biopsy. Electronically Signed   By: Marijo Conception M.D.   On: 10/24/2020 15:47   DG C-Arm 1-60 Min-No Report  Result Date: 10/24/2020 Fluoroscopy was utilized by the requesting physician.  No radiographic interpretation.   IR IMAGING GUIDED PORT INSERTION  Result Date: 10/28/2020 INDICATION: 70 year old male referred for port catheter placement EXAM: IMAGE GUIDED PORT CATHETER PLACEMENT MEDICATIONS: None ANESTHESIA/SEDATION: Moderate (conscious) sedation was employed during this procedure. A total of Versed 1.0 mg and Fentanyl 50 mcg was administered intravenously. Moderate Sedation Time: 20 minutes. The patient's level of consciousness and vital signs were monitored continuously by radiology nursing throughout the procedure under my direct supervision. FLUOROSCOPY TIME:  Fluoroscopy Time: 0 minutes 6 seconds (0.57 mGy). COMPLICATIONS: None PROCEDURE: The procedure, risks, benefits, and alternatives were  explained to the patient. Questions regarding the procedure were encouraged and answered. The patient understands and consents to the procedure. Ultrasound survey was performed with images stored and sent to PACs. The right neck and chest was prepped with chlorhexidine, and draped in the usual sterile fashion using maximum barrier technique (cap and mask, sterile gown, sterile gloves, large sterile sheet, hand hygiene and cutaneous antiseptic). Local anesthesia was attained by infiltration with 1% lidocaine without epinephrine. Ultrasound demonstrated patency of the right internal jugular vein, and this was documented with an image. Under real-time ultrasound guidance, this vein  was accessed with a 21 gauge micropuncture needle and image documentation was performed. A small dermatotomy was made at the access site with an 11 scalpel. A 0.018" wire was advanced into the SVC and used to estimate the length of the internal catheter. The access needle exchanged for a 31F micropuncture vascular sheath. The 0.018" wire was then removed and a 0.035" wire advanced into the IVC. An appropriate location for the subcutaneous reservoir was selected below the clavicle and an incision was made through the skin and underlying soft tissues. The subcutaneous tissues were then dissected using a combination of blunt and sharp surgical technique and a pocket was formed. A single lumen power injectable portacatheter was then tunneled through the subcutaneous tissues from the pocket to the dermatotomy and the port reservoir placed within the subcutaneous pocket. The venous access site was then serially dilated and a peel away vascular sheath placed over the wire. The wire was removed and the port catheter advanced into position under fluoroscopic guidance. The catheter tip is positioned in the cavoatrial junction. This was documented with a spot image. The portacatheter was then tested and found to flush and aspirate well. The port was  flushed with saline followed by 100 units/mL heparinized saline. The pocket was then closed in two layers using first subdermal inverted interrupted absorbable sutures followed by a running subcuticular suture. The epidermis was then sealed with Dermabond. The dermatotomy at the venous access site was also seal with Dermabond. Patient tolerated the procedure well and remained hemodynamically stable throughout. No complications encountered and no significant blood loss encountered IMPRESSION: Status post right IJ port catheter placement. Signed, Dulcy Fanny. Dellia Nims, RPVI [facility] Electronically Signed   By: Corrie Mckusick D.O.   On: 10/28/2020 11:30    PERFORMANCE STATUS (ECOG) : 1 - Symptomatic but completely ambulatory  Review of Systems Unless otherwise noted, a complete review of systems is negative.  Physical Exam General: NAD Pulmonary: Unlabored Extremities: no edema, no joint deformities Skin: no rashes Neurological: Weakness but otherwise nonfocal  IMPRESSION: I met with patient in infusion.  Symptomatically, he continues to endorse persistent left shoulder pain, which he describes as severe in intensity.  He currently rates pain as 4 out of 10 but says that it at times gets higher than 10 out of 10.  He is taking gabapentin 3 times daily and oxycodone 10 mg every 4 hours around-the-clock.  Patient reports that the oxycodone has been particularly helpful at stabilizing the pain.  He occasionally reports breakthrough pain before the next dose is due.  Patient is starting XRT today and hopefully that will result in improvement in his pain.  We will plan to follow-up with him next week and if needed could consider starting him on a long-acting opioid at that time.  Patient also endorses poor oral intake.  He has had weight loss over the past month.  We will refer him to nutrition with recommendation that he began oral nutritional  supplements.  Patient endorses constipation and we discussed liberalizing his bowel regimen.  He was not quite clear what he is taking and says that his wife is managing most of his medications.  Attempted to call his wife without success.  Would recommend senna/MiraLAX daily.  Almost certainly his constipation is opioid induced.  At baseline, patient says that he is independent with his own care.  He no longer drives but is still highly functional.  Patient will benefit from conversation  regarding advance care planning.  PLAN: -Continue current scope of treatment -Continue gabapentin/oxycodone -Consider starting OxyContin 15 mg every 12 hours if pain is not improved with XRT -Liberalize bowel regimen with daily senna/MiraLAX -Recommend starting oral nutritional supplements -Referral for nutrition -Will benefit from ACP conversation -RTC in 1-2 weeks   Patient expressed understanding and was in agreement with this plan. He also understands that He can call the clinic at any time with any questions, concerns, or complaints.     Time Total: 25 minutes  Visit consisted of counseling and education dealing with the complex and emotionally intense issues of symptom management and palliative care in the setting of serious and potentially life-threatening illness.Greater than 50%  of this time was spent counseling and coordinating care related to the above assessment and plan.  Signed by: Altha Harm, PhD, NP-C

## 2020-11-03 NOTE — Research (Signed)
Trial: [id number]  Patient Donald Lowery was identified by Jeral Fruit, RN as a potential candidate for the above listed study.  This Clinical Research Nurse met with KEIDAN AUMILLER, [id number], on 11/03/20 in a manner and location that ensures patient privacy to discuss participation in the above listed research study.  Patient is Accompanied by spouse .  A copy of the informed consent document and separate HIPAA Authorization was provided to the patient.  Patient reads, speaks, and understands [place].    Patient was provided with the business card of this Nurse and encouraged to contact the research team with any questions.  Patient was provided the option of taking informed consent documents home to review and was encouraged to review at their convenience with their support network, including other care providers. Patient is comfortable with making a decision regarding study participation today.  As outlined in the informed consent form, this Nurse and [name] discussed the purpose of the research study, the investigational nature of the study, study procedures and requirements for study participation, potential risks and benefits of study participation, as well as alternatives to participation. This study is not blinded. The patient understands participation is voluntary and they may withdraw from study participation at any time.  This study does not involve randomization.  This study does not involve an investigational drug or device. This study does not involve a placebo. Patient understands enrollment is pending full eligibility review.   Confidentiality and how the patient's information will be used as part of study participation were discussed.  Patient was informed there is reimbursement provided for their time and effort spent on trial participation.  The patient is encouraged to discuss research study participation with their insurance provider to determine what costs they may  incur as part of study participation, including research related injury.    All questions were answered to patient's satisfaction.  The informed consent and separate HIPAA Authorization was reviewed page by page.  The patient's mental and emotional status is appropriate to provide informed consent, and the patient verbalizes an understanding of study participation.  Patient has agreed to participate in the above listed research study and has voluntarily signed the informed consent 09/14/2020 version 1 and separate HIPAA Authorization, version 5  on 11/03/20 at 0857AM.  The patient was provided with a copy of the signed informed consent form and separate HIPAA Authorization for their reference.  No study specific procedures were obtained prior to the signing of the informed consent document.  Approximately 20 minutes were spent with the patient reviewing the informed consent documents.  After obtaining informed consent patient, voluntarily signed the optional Release of Information form for use throughout trial participation.  The patient was escorted to the infusion room, port was accessed by Magda Paganini, RN without any difficulty. Vials were drawn via his port per protocol and given to [employer], [place] for processing and shipment at 0912 am. The patient was given his $46 [employer] gift card after his blood was obtained by Mauricio Po, CRS, patient signed receipt for the gift card with instructions for use provided. The patient denied any questions or concerns. The patient was thanked for his participation in the clinical trials. His gift card was placed in his blue bag he brought with him.  Jeral Fruit, RN 11/03/20 9:27 AM

## 2020-11-03 NOTE — Patient Instructions (Signed)
[facility]  Discharge Instructions: Thank you for choosing [facility] to provide your oncology and hematology care.  If you have a lab appointment with the [facility], please go directly to the [facility] and check in at the registration area.  Wear comfortable clothing and clothing appropriate for easy access to any Portacath or PICC line.   We strive to give you quality time with your provider. You may need to reschedule your appointment if you arrive late (15 or more minutes).  Arriving late affects you and other patients whose appointments are after yours.  Also, if you miss three or more appointments without notifying the office, you may be dismissed from the clinic at the provider's discretion.      For prescription refill requests, have your pharmacy contact our office and allow 72 hours for refills to be completed.    Today you received the following chemotherapy and/or immunotherapy agents: Taxol, Carboplatin      To help prevent nausea and vomiting after your treatment, we encourage you to take your nausea medication as directed.  BELOW ARE SYMPTOMS THAT SHOULD BE REPORTED IMMEDIATELY: *FEVER GREATER THAN 100.4 F (38 C) OR HIGHER *CHILLS OR SWEATING *NAUSEA AND VOMITING THAT IS NOT CONTROLLED WITH YOUR NAUSEA MEDICATION *UNUSUAL SHORTNESS OF BREATH *UNUSUAL BRUISING OR BLEEDING *URINARY PROBLEMS (pain or burning when urinating, or frequent urination) *BOWEL PROBLEMS (unusual diarrhea, constipation, pain near the anus) TENDERNESS IN MOUTH AND THROAT WITH OR WITHOUT PRESENCE OF ULCERS (sore throat, sores in mouth, or a toothache) UNUSUAL RASH, SWELLING OR PAIN  UNUSUAL VAGINAL DISCHARGE OR ITCHING   Items with * indicate a potential emergency and should be followed up as soon as possible or go to the Emergency Department if any problems should occur.  Please show the CHEMOTHERAPY ALERT CARD or IMMUNOTHERAPY ALERT CARD at  check-in to the Emergency Department and triage nurse.  Should you have questions after your visit or need to cancel or reschedule your appointment, please contact [facility]  [phone] and follow the prompts.  Office hours are 8:00 a.m. to 4:30 p.m. Monday - Friday. Please note that voicemails left after 4:00 p.m. may not be returned until the following business day.  We are closed weekends and major holidays. You have access to a nurse at all times for urgent questions. Please call the main number to the clinic [phone] and follow the prompts.  For any non-urgent questions, you may also contact your provider using MyChart. We now offer e-Visits for anyone 38 and older to request care online for non-urgent symptoms. For details visit [email or web address].   Also download the MyChart app! Go to the app store, search "MyChart", open the app, select [facility], and log in with your MyChart username and password.  Due to Covid, a mask is required upon entering the hospital/clinic. If you do not have a mask, one will be given to you upon arrival. For doctor visits, patients may have 1 support person aged 32 or older with them. For treatment visits, patients cannot have anyone with them due to current Covid guidelines and our immunocompromised population. Carboplatin injection What is this medication? CARBOPLATIN (KAR boe pla tin) is a chemotherapy drug. It targets fast dividing cells, like cancer cells, and causes these cells to die. This medicine is usedto treat ovarian cancer and many other cancers. This medicine may be used for other purposes; ask your health care provider orpharmacist if you  have questions. COMMON BRAND NAME(S): Paraplatin What should I tell my care team before I take this medication? They need to know if you have any of these conditions: blood disorders hearing problems kidney disease recent or ongoing radiation therapy an unusual  or allergic reaction to carboplatin, cisplatin, other chemotherapy, other medicines, foods, dyes, or preservatives pregnant or trying to get pregnant breast-feeding How should I use this medication? This drug is usually given as an infusion into a vein. It is administered in [place] or clinic by a specially trained health care professional. Talk to your pediatrician regarding the use of this medicine in children.Special care may be needed. Overdosage: If you think you have taken too much of this medicine contact apoison control center or emergency room at once. NOTE: This medicine is only for you. Do not share this medicine with others. What if I miss a dose? It is important not to miss a dose. Call your doctor or health careprofessional if you are unable to keep an appointment. What may interact with this medication? medicines for seizures medicines to increase blood counts like filgrastim, pegfilgrastim, sargramostim some antibiotics like amikacin, gentamicin, neomycin, streptomycin, tobramycin vaccines Talk to your doctor or health care professional before taking any of thesemedicines: acetaminophen aspirin ibuprofen ketoprofen naproxen This list may not describe all possible interactions. Give your health care provider a list of all the medicines, herbs, non-prescription drugs, or dietary supplements you use. Also tell them if you smoke, drink alcohol, or use illegaldrugs. Some items may interact with your medicine. What should I watch for while using this medication? Your condition will be monitored carefully while you are receiving this medicine. You will need important blood work done while you are taking thismedicine. This drug may make you feel generally unwell. This is not uncommon, as chemotherapy can affect healthy cells as well as cancer cells. Report any side effects. Continue your course of treatment even though you feel ill unless yourdoctor tells you to stop. In some  cases, you may be given additional medicines to help with side effects.Follow all directions for their use. Call your doctor or health care professional for advice if you get a fever, chills or sore throat, or other symptoms of a cold or flu. Do not treat yourself. This drug decreases your body's ability to fight infections. Try toavoid being around people who are sick. This medicine may increase your risk to bruise or bleed. Call your doctor orhealth care professional if you notice any unusual bleeding. Be careful brushing and flossing your teeth or using a toothpick because you may get an infection or bleed more easily. If you have any dental work done,tell your dentist you are receiving this medicine. Avoid taking products that contain aspirin, acetaminophen, ibuprofen, naproxen, or ketoprofen unless instructed by your doctor. These medicines may hide afever. Do not become pregnant while taking this medicine. Women should inform their doctor if they wish to become pregnant or think they might be pregnant. There is a potential for serious side effects to an unborn child. Talk to your health care professional or pharmacist for more information. Do not breast-feed aninfant while taking this medicine. What side effects may I notice from receiving this medication? Side effects that you should report to your doctor or health care professionalas soon as possible: allergic reactions like skin rash, itching or hives, swelling of the face, lips, or tongue signs of infection - fever or chills, cough, sore throat, pain or difficulty passing urine signs of decreased  platelets or bleeding - bruising, pinpoint red spots on the skin, black, tarry stools, nosebleeds signs of decreased red blood cells - unusually weak or tired, fainting spells, lightheadedness breathing problems changes in hearing changes in vision chest pain high blood pressure low blood counts - This drug may decrease the number of white blood  cells, red blood cells and platelets. You may be at increased risk for infections and bleeding. nausea and vomiting pain, swelling, redness or irritation at the injection site pain, tingling, numbness in the hands or feet problems with balance, talking, walking trouble passing urine or change in the amount of urine Side effects that usually do not require medical attention (report to yourdoctor or health care professional if they continue or are bothersome): hair loss loss of appetite metallic taste in the mouth or changes in taste This list may not describe all possible side effects. Call your doctor for medical advice about side effects. You may report side effects to FDA at1-800-FDA-1088. Where should I keep my medication? This drug is given in a hospital or clinic and will not be stored at home. NOTE: This sheet is a summary. It may not cover all possible information. If you have questions about this medicine, talk to your doctor, pharmacist, orhealth care provider.  2022 Elsevier/Gold Standard (2007-07-22 14:38:05) Paclitaxel injection What is this medication? PACLITAXEL (PAK li TAX el) is a chemotherapy drug. It targets fast dividing cells, like cancer cells, and causes these cells to die. This medicine is used to treat ovarian cancer, breast cancer, lung cancer, Kaposi's sarcoma, andother cancers. This medicine may be used for other purposes; ask your health care provider orpharmacist if you have questions. COMMON BRAND NAME(S): Onxol, Taxol What should I tell my care team before I take this medication? They need to know if you have any of these conditions: history of irregular heartbeat liver disease low blood counts, like low white cell, platelet, or red cell counts lung or breathing disease, like asthma tingling of the fingers or toes, or other nerve disorder an unusual or allergic reaction to paclitaxel, alcohol, polyoxyethylated castor oil, other chemotherapy, other medicines,  foods, dyes, or preservatives pregnant or trying to get pregnant breast-feeding How should I use this medication? This drug is given as an infusion into a vein. It is administered in a hospitalor clinic by a specially trained health care professional. Talk to your pediatrician regarding the use of this medicine in children.Special care may be needed. Overdosage: If you think you have taken too much of this medicine contact apoison control center or emergency room at once. NOTE: This medicine is only for you. Do not share this medicine with others. What if I miss a dose? It is important not to miss your dose. Call your doctor or health careprofessional if you are unable to keep an appointment. What may interact with this medication? Do not take this medicine with any of the following medications: live virus vaccines This medicine may also interact with the following medications: antiviral medicines for hepatitis, HIV or AIDS certain antibiotics like erythromycin and clarithromycin certain medicines for fungal infections like ketoconazole and itraconazole certain medicines for seizures like carbamazepine, phenobarbital, phenytoin gemfibrozil nefazodone rifampin St. John's wort This list may not describe all possible interactions. Give your health care provider a list of all the medicines, herbs, non-prescription drugs, or dietary supplements you use. Also tell them if you smoke, drink alcohol, or use illegaldrugs. Some items may interact with your medicine. What should I watch  for while using this medication? Your condition will be monitored carefully while you are receiving this medicine. You will need important blood work done while you are taking thismedicine. This medicine can cause serious allergic reactions. To reduce your risk you will need to take other medicine(s) before treatment with this medicine. If you experience allergic reactions like skin rash, itching or hives, swelling of  theface, lips, or tongue, tell your doctor or health care professional right away. In some cases, you may be given additional medicines to help with side effects.Follow all directions for their use. This drug may make you feel generally unwell. This is not uncommon, as chemotherapy can affect healthy cells as well as cancer cells. Report any side effects. Continue your course of treatment even though you feel ill unless yourdoctor tells you to stop. Call your doctor or health care professional for advice if you get a fever, chills or sore throat, or other symptoms of a cold or flu. Do not treat yourself. This drug decreases your body's ability to fight infections. Try toavoid being around people who are sick. This medicine may increase your risk to bruise or bleed. Call your doctor orhealth care professional if you notice any unusual bleeding. Be careful brushing and flossing your teeth or using a toothpick because you may get an infection or bleed more easily. If you have any dental work done,tell your dentist you are receiving this medicine. Avoid taking products that contain aspirin, acetaminophen, ibuprofen, naproxen, or ketoprofen unless instructed by your doctor. These medicines may hide afever. Do not become pregnant while taking this medicine. Women should inform their doctor if they wish to become pregnant or think they might be pregnant. There is a potential for serious side effects to an unborn child. Talk to your health care professional or pharmacist for more information. Do not breast-feed aninfant while taking this medicine. Men are advised not to father a child while receiving this medicine. This product may contain alcohol. Ask your pharmacist or healthcare provider if this medicine contains alcohol. Be sure to tell all healthcare providers you are taking this medicine. Certain medicines, like metronidazole and disulfiram, can cause an unpleasant reaction when taken with alcohol. The  reaction includes flushing, headache, nausea, vomiting, sweating, and increased thirst. Thereaction can last from 30 minutes to several hours. What side effects may I notice from receiving this medication? Side effects that you should report to your doctor or health care professionalas soon as possible: allergic reactions like skin rash, itching or hives, swelling of the face, lips, or tongue breathing problems changes in vision fast, irregular heartbeat high or low blood pressure mouth sores pain, tingling, numbness in the hands or feet signs of decreased platelets or bleeding - bruising, pinpoint red spots on the skin, black, tarry stools, blood in the urine signs of decreased red blood cells - unusually weak or tired, feeling faint or lightheaded, falls signs of infection - fever or chills, cough, sore throat, pain or difficulty passing urine signs and symptoms of liver injury like dark yellow or brown urine; general ill feeling or flu-like symptoms; light-colored stools; loss of appetite; nausea; right upper belly pain; unusually weak or tired; yellowing of the eyes or skin swelling of the ankles, feet, hands unusually slow heartbeat Side effects that usually do not require medical attention (report to yourdoctor or health care professional if they continue or are bothersome): diarrhea hair loss loss of appetite muscle or joint pain nausea, vomiting pain, redness, or irritation at  site where injected tiredness This list may not describe all possible side effects. Call your doctor for medical advice about side effects. You may report side effects to FDA at1-800-FDA-1088. Where should I keep my medication? This drug is given in a hospital or clinic and will not be stored at home. NOTE: This sheet is a summary. It may not cover all possible information. If you have questions about this medicine, talk to your doctor, pharmacist, orhealth care provider.  2022 Elsevier/Gold Standard  (2019-03-18 13:37:23)

## 2020-11-04 ENCOUNTER — Encounter: Payer: Self-pay | Admitting: Oncology

## 2020-11-04 ENCOUNTER — Telehealth: Payer: Self-pay

## 2020-11-04 DIAGNOSIS — Z510 Encounter for antineoplastic radiation therapy: Secondary | ICD-10-CM | POA: Diagnosis not present

## 2020-11-04 NOTE — Telephone Encounter (Signed)
Telephone call to patient for follow up after receiving first infusion.   No answer but left message stating we were calling to check on them.  Encouraged patient to call for any questions or concerns.   

## 2020-11-08 ENCOUNTER — Telehealth: Payer: Self-pay | Admitting: Cardiovascular Disease

## 2020-11-08 DIAGNOSIS — I10 Essential (primary) hypertension: Secondary | ICD-10-CM

## 2020-11-08 MED ORDER — LOSARTAN POTASSIUM 25 MG PO TABS: 25.0000 mg | ORAL_TABLET | Freq: Every day | ORAL | 1 refills | Status: DC

## 2020-11-08 NOTE — Telephone Encounter (Signed)
*  STAT* If patient is at the pharmacy, call can be transferred to refill team.   1. Which medications need to be refilled? (please list name of each medication and dose if known)  Losartan 25 MG 1 tablet a day  2. Which pharmacy/location (including street and city if local pharmacy) is medication to be sent to? [facility] in [place]  3. Do they need a 30 day or 90 day supply? 90 day

## 2020-11-09 ENCOUNTER — Ambulatory Visit: Admission: RE | Admit: 2020-11-09 | Payer: Medicare HMO | Source: Ambulatory Visit

## 2020-11-09 DIAGNOSIS — Z510 Encounter for antineoplastic radiation therapy: Secondary | ICD-10-CM | POA: Diagnosis not present

## 2020-11-10 ENCOUNTER — Inpatient Hospital Stay: Payer: Medicare HMO | Attending: Oncology

## 2020-11-10 ENCOUNTER — Other Ambulatory Visit: Payer: Self-pay | Admitting: *Deleted

## 2020-11-10 ENCOUNTER — Inpatient Hospital Stay (HOSPITAL_BASED_OUTPATIENT_CLINIC_OR_DEPARTMENT_OTHER): Payer: Medicare HMO | Admitting: Nurse Practitioner

## 2020-11-10 ENCOUNTER — Inpatient Hospital Stay (HOSPITAL_BASED_OUTPATIENT_CLINIC_OR_DEPARTMENT_OTHER): Payer: Medicare HMO | Admitting: Hospice and Palliative Medicine

## 2020-11-10 ENCOUNTER — Inpatient Hospital Stay: Payer: Medicare HMO

## 2020-11-10 ENCOUNTER — Encounter: Payer: Self-pay | Admitting: *Deleted

## 2020-11-10 ENCOUNTER — Ambulatory Visit
Admission: RE | Admit: 2020-11-10 | Discharge: 2020-11-10 | Disposition: A | Discharge status: Home / Self Care | Payer: Medicare HMO | Source: Ambulatory Visit | Attending: Radiation Oncology | Admitting: Radiation Oncology

## 2020-11-10 ENCOUNTER — Telehealth: Payer: Self-pay | Admitting: Oncology

## 2020-11-10 ENCOUNTER — Encounter: Payer: Self-pay | Admitting: Nurse Practitioner

## 2020-11-10 ENCOUNTER — Other Ambulatory Visit: Payer: Self-pay

## 2020-11-10 VITALS — BP 122/64 | HR 97 | Temp 96.0°F | Resp 17 | Wt 126.8 lb

## 2020-11-10 DIAGNOSIS — C3490 Malignant neoplasm of unspecified part of unspecified bronchus or lung: Secondary | ICD-10-CM | POA: Diagnosis not present

## 2020-11-10 DIAGNOSIS — G893 Neoplasm related pain (acute) (chronic): Secondary | ICD-10-CM

## 2020-11-10 DIAGNOSIS — Z87891 Personal history of nicotine dependence: Secondary | ICD-10-CM | POA: Diagnosis not present

## 2020-11-10 DIAGNOSIS — C3412 Malignant neoplasm of upper lobe, left bronchus or lung: Secondary | ICD-10-CM | POA: Diagnosis present

## 2020-11-10 DIAGNOSIS — R630 Anorexia: Secondary | ICD-10-CM | POA: Diagnosis not present

## 2020-11-10 DIAGNOSIS — C3492 Malignant neoplasm of unspecified part of left bronchus or lung: Secondary | ICD-10-CM

## 2020-11-10 DIAGNOSIS — Z515 Encounter for palliative care: Secondary | ICD-10-CM

## 2020-11-10 DIAGNOSIS — E871 Hypo-osmolality and hyponatremia: Secondary | ICD-10-CM | POA: Diagnosis not present

## 2020-11-10 DIAGNOSIS — Z5111 Encounter for antineoplastic chemotherapy: Secondary | ICD-10-CM

## 2020-11-10 DIAGNOSIS — Z510 Encounter for antineoplastic radiation therapy: Secondary | ICD-10-CM | POA: Diagnosis not present

## 2020-11-10 LAB — COMPREHENSIVE METABOLIC PANEL
ALT: 21 U/L (ref 0–44)
AST: 23 U/L (ref 15–41)
Albumin: 3.3 g/dL — ABNORMAL LOW (ref 3.5–5.0)
Alkaline Phosphatase: 98 U/L (ref 38–126)
Anion gap: 6 (ref 5–15)
BUN: 18 mg/dL (ref 8–23)
CO2: 27 mmol/L (ref 22–32)
Calcium: 11 mg/dL — ABNORMAL HIGH (ref 8.9–10.3)
Chloride: 92 mmol/L — ABNORMAL LOW (ref 98–111)
Creatinine, Ser: 0.97 mg/dL (ref 0.61–1.24)
GFR, Estimated: >60 mL/min (ref >60)
Glucose, Bld: 144 mg/dL — ABNORMAL HIGH (ref 70–99)
Potassium: 3.8 mmol/L (ref 3.5–5.1)
Sodium: 125 mmol/L — ABNORMAL LOW (ref 135–145)
Total Bilirubin: 0.9 mg/dL (ref 0.3–1.2)
Total Protein: 6.9 g/dL (ref 6.5–8.1)

## 2020-11-10 LAB — CBC WITH DIFFERENTIAL/PLATELET
Abs Immature Granulocytes: 0.12 10*3/uL — ABNORMAL HIGH (ref 0.00–0.07)
Basophils Absolute: 0.1 10*3/uL (ref 0.0–0.1)
Basophils Relative: 0 %
Eosinophils Absolute: 0.1 10*3/uL (ref 0.0–0.5)
Eosinophils Relative: 0 %
HCT: 29.8 % — ABNORMAL LOW (ref 39.0–52.0)
Hemoglobin: 10.1 g/dL — ABNORMAL LOW (ref 13.0–17.0)
Immature Granulocytes: 1 %
Lymphocytes Relative: 7 %
Lymphs Abs: 1 10*3/uL (ref 0.7–4.0)
MCH: 28.1 pg (ref 26.0–34.0)
MCHC: 33.9 g/dL (ref 30.0–36.0)
MCV: 83 fL (ref 80.0–100.0)
Monocytes Absolute: 0.8 10*3/uL (ref 0.1–1.0)
Monocytes Relative: 5 %
Neutro Abs: 12.4 10*3/uL — ABNORMAL HIGH (ref 1.7–7.7)
Neutrophils Relative %: 87 %
Platelets: 393 10*3/uL (ref 150–400)
RBC: 3.59 MIL/uL — ABNORMAL LOW (ref 4.22–5.81)
RDW: 13.1 % (ref 11.5–15.5)
WBC: 14.4 10*3/uL — ABNORMAL HIGH (ref 4.0–10.5)
nRBC: 0 % (ref 0.0–0.2)

## 2020-11-10 LAB — OSMOLALITY, URINE: Osmolality, Ur: 178 mOsm/kg — ABNORMAL LOW (ref 300–900)

## 2020-11-10 LAB — SODIUM, URINE, RANDOM: Sodium, Ur: <10 mmol/L

## 2020-11-10 MED ORDER — DIPHENHYDRAMINE HCL 50 MG/ML IJ SOLN
25.0000 mg | Freq: Once | INTRAMUSCULAR | Status: AC
  Administered 2020-11-10: 25 mg via INTRAVENOUS
  Filled 2020-11-10: qty 1

## 2020-11-10 MED ORDER — HEPARIN SOD (PORK) LOCK FLUSH 100 UNIT/ML IV SOLN
INTRAVENOUS | Status: AC
  Filled 2020-11-10: qty 5

## 2020-11-10 MED ORDER — SODIUM CHLORIDE 0.9 % IV SOLN
10.0000 mg | Freq: Once | INTRAVENOUS | Status: AC
  Administered 2020-11-10: 10 mg via INTRAVENOUS
  Filled 2020-11-10: qty 10

## 2020-11-10 MED ORDER — SODIUM CHLORIDE 0.9% FLUSH
10.0000 mL | INTRAVENOUS | Status: DC | PRN
  Administered 2020-11-10 (×2): 10 mL
  Filled 2020-11-10: qty 10

## 2020-11-10 MED ORDER — FAMOTIDINE 20 MG IN NS 100 ML IVPB
20.0000 mg | Freq: Once | INTRAVENOUS | Status: AC
Start: 2020-11-10 — End: 2020-11-10
  Administered 2020-11-10: 20 mg via INTRAVENOUS
  Filled 2020-11-10: qty 20

## 2020-11-10 MED ORDER — PALONOSETRON HCL INJECTION 0.25 MG/5ML
0.2500 mg | Freq: Once | INTRAVENOUS | Status: AC
  Administered 2020-11-10: 0.25 mg via INTRAVENOUS
  Filled 2020-11-10: qty 5

## 2020-11-10 MED ORDER — SODIUM CHLORIDE 0.9 % IV SOLN
160.0000 mg | Freq: Once | INTRAVENOUS | Status: AC
  Administered 2020-11-10: 160 mg via INTRAVENOUS
  Filled 2020-11-10: qty 16

## 2020-11-10 MED ORDER — SODIUM CHLORIDE 0.9 % IV SOLN
45.0000 mg/m2 | Freq: Once | INTRAVENOUS | Status: AC
  Administered 2020-11-10: 78 mg via INTRAVENOUS
  Filled 2020-11-10: qty 13

## 2020-11-10 MED ORDER — HEPARIN SOD (PORK) LOCK FLUSH 100 UNIT/ML IV SOLN
500.0000 [IU] | Freq: Once | INTRAVENOUS | Status: AC | PRN
  Administered 2020-11-10: 500 [IU]
  Filled 2020-11-10: qty 5

## 2020-11-10 MED ORDER — ZOLEDRONIC ACID 4 MG/100ML IV SOLN
4.0000 mg | Freq: Once | INTRAVENOUS | Status: AC
  Administered 2020-11-10: 4 mg via INTRAVENOUS
  Filled 2020-11-10: qty 100

## 2020-11-10 MED ORDER — OXYCODONE HCL 10 MG PO TABS: 10.0000 mg | ORAL_TABLET | ORAL | 0 refills | Status: DC | PRN

## 2020-11-10 MED ORDER — SODIUM CHLORIDE 0.9 % IV SOLN
Freq: Once | INTRAVENOUS | Status: AC
  Filled 2020-11-10: qty 250

## 2020-11-10 NOTE — Patient Instructions (Signed)
[facility]  Discharge Instructions: Thank you for choosing [facility] to provide your oncology and hematology care.  If you have a lab appointment with the [facility], please go directly to the [facility] and check in at the registration area.  Wear comfortable clothing and clothing appropriate for easy access to any Portacath or PICC line.   We strive to give you quality time with your provider. You may need to reschedule your appointment if you arrive late (15 or more minutes).  Arriving late affects you and other patients whose appointments are after yours.  Also, if you miss three or more appointments without notifying the office, you may be dismissed from the clinic at the provider's discretion.      For prescription refill requests, have your pharmacy contact our office and allow 72 hours for refills to be completed.    Today you received the following chemotherapy and/or immunotherapy agents - paclitaxel, carboplatin      To help prevent nausea and vomiting after your treatment, we encourage you to take your nausea medication as directed.  BELOW ARE SYMPTOMS THAT SHOULD BE REPORTED IMMEDIATELY: *FEVER GREATER THAN 100.4 F (38 C) OR HIGHER *CHILLS OR SWEATING *NAUSEA AND VOMITING THAT IS NOT CONTROLLED WITH YOUR NAUSEA MEDICATION *UNUSUAL SHORTNESS OF BREATH *UNUSUAL BRUISING OR BLEEDING *URINARY PROBLEMS (pain or burning when urinating, or frequent urination) *BOWEL PROBLEMS (unusual diarrhea, constipation, pain near the anus) TENDERNESS IN MOUTH AND THROAT WITH OR WITHOUT PRESENCE OF ULCERS (sore throat, sores in mouth, or a toothache) UNUSUAL RASH, SWELLING OR PAIN  UNUSUAL VAGINAL DISCHARGE OR ITCHING   Items with * indicate a potential emergency and should be followed up as soon as possible or go to the Emergency Department if any problems should occur.  Please show the CHEMOTHERAPY ALERT CARD or IMMUNOTHERAPY ALERT  CARD at check-in to the Emergency Department and triage nurse.  Should you have questions after your visit or need to cancel or reschedule your appointment, please contact [facility]  [phone] and follow the prompts.  Office hours are 8:00 a.m. to 4:30 p.m. Monday - Friday. Please note that voicemails left after 4:00 p.m. may not be returned until the following business day.  We are closed weekends and major holidays. You have access to a nurse at all times for urgent questions. Please call the main number to the clinic [phone] and follow the prompts.  For any non-urgent questions, you may also contact your provider using MyChart. We now offer e-Visits for anyone 11 and older to request care online for non-urgent symptoms. For details visit [email or web address].   Also download the MyChart app! Go to the app store, search "MyChart", open the app, select [facility], and log in with your MyChart username and password.  Due to Covid, a mask is required upon entering the hospital/clinic. If you do not have a mask, one will be given to you upon arrival. For doctor visits, patients may have 1 support person aged 62 or older with them. For treatment visits, patients cannot have anyone with them due to current Covid guidelines and our immunocompromised population.   Zoledronic Acid Injection (Hypercalcemia, Oncology) What is this medication? ZOLEDRONIC ACID (ZOE le dron ik AS id) slows calcium loss from bones. It high calcium levels in the blood from some kinds of cancer. It may be used in otherpeople at risk for bone loss. This medicine may be used for other purposes;  ask your health care provider orpharmacist if you have questions. COMMON BRAND NAME(S): Zometa What should I tell my care team before I take this medication? They need to know if you have any of these conditions: cancer dehydration dental disease kidney disease liver disease low levels of  calcium in the blood lung or breathing disease (asthma) receiving steroids like dexamethasone or prednisone an unusual or allergic reaction to zoledronic acid, other medicines, foods, dyes, or preservatives pregnant or trying to get pregnant breast-feeding How should I use this medication? This drug is injected into a vein. It is given by a health care provider in [place] or clinic setting. Talk to your health care provider about the use of this drug in children.Special care may be needed. Overdosage: If you think you have taken too much of this medicine contact apoison control center or emergency room at once. NOTE: This medicine is only for you. Do not share this medicine with others. What if I miss a dose? Keep appointments for follow-up doses. It is important not to miss your dose.Call your health care provider if you are unable to keep an appointment. What may interact with this medication? certain antibiotics given by injection NSAIDs, medicines for pain and inflammation, like ibuprofen or naproxen some diuretics like bumetanide, furosemide teriparatide thalidomide This list may not describe all possible interactions. Give your health care provider a list of all the medicines, herbs, non-prescription drugs, or dietary supplements you use. Also tell them if you smoke, drink alcohol, or use illegaldrugs. Some items may interact with your medicine. What should I watch for while using this medication? Visit your health care provider for regular checks on your progress. It may besome time before you see the benefit from this drug. Some people who take this drug have severe bone, joint, or muscle pain. This drug may also increase your risk for jaw problems or a broken thigh bone. Tell your health care provider right away if you have severe pain in your jaw, bones, joints, or muscles. Tell you health care provider if you have any painthat does not go away or that gets worse. Tell your dentist  and dental surgeon that you are taking this drug. You should not have major dental surgery while on this drug. See your dentist to have a dental exam and fix any dental problems before starting this drug. Take good care of your teeth while on this drug. Make sure you see your dentist forregular follow-up appointments. You should make sure you get enough calcium and vitamin D while you are taking this drug. Discuss the foods you eat and the vitamins you take with your healthcare provider. Check with your health care provider if you have severe diarrhea, nausea, and vomiting, or if you sweat a lot. The loss of too much body fluid may make itdangerous for you to take this drug. You may need blood work done while you are taking this drug. Do not become pregnant while taking this drug. Women should inform their health care provider if they wish to become pregnant or think they might be pregnant. There is potential for serious harm to an unborn child. Talk to your healthcare provider for more information. What side effects may I notice from receiving this medication? Side effects that you should report to your doctor or health care provider assoon as possible: allergic reactions (skin rash, itching or hives; swelling of the face, lips, or tongue) bone pain infection (fever, chills, cough, sore throat, pain or trouble  passing urine) jaw pain, especially after dental work joint pain kidney injury (trouble passing urine or change in the amount of urine) low blood pressure (dizziness; feeling faint or lightheaded, falls; unusually weak or tired) low calcium levels (fast heartbeat; muscle cramps or pain; pain, tingling, or numbness in the hands or feet; seizures) low magnesium levels (fast, irregular heartbeat; muscle cramp or pain; muscle weakness; tremors; seizures) low red blood cell counts (trouble breathing; feeling faint; lightheaded, falls; unusually weak or tired) muscle pain redness, blistering,  peeling, or loosening of the skin, including inside the mouth severe diarrhea swelling of the ankles, feet, hands trouble breathing Side effects that usually do not require medical attention (report to yourdoctor or health care provider if they continue or are bothersome): anxious constipation coughing depressed mood eye irritation, itching, or pain fever general ill feeling or flu-like symptoms nausea pain, redness, or irritation at site where injected trouble sleeping This list may not describe all possible side effects. Call your doctor for medical advice about side effects. You may report side effects to FDA at1-800-FDA-1088. Where should I keep my medication? This drug is given in a hospital or clinic. It will not be stored at home. NOTE: This sheet is a summary. It may not cover all possible information. If you have questions about this medicine, talk to your doctor, pharmacist, orhealth care provider.  2022 Elsevier/Gold Standard (2019-01-29 09:13:00)  Paclitaxel injection What is this medication? PACLITAXEL (PAK li TAX el) is a chemotherapy drug. It targets fast dividing cells, like cancer cells, and causes these cells to die. This medicine is used to treat ovarian cancer, breast cancer, lung cancer, Kaposi's sarcoma, andother cancers. This medicine may be used for other purposes; ask your health care provider orpharmacist if you have questions. COMMON BRAND NAME(S): Onxol, Taxol What should I tell my care team before I take this medication? They need to know if you have any of these conditions: history of irregular heartbeat liver disease low blood counts, like low white cell, platelet, or red cell counts lung or breathing disease, like asthma tingling of the fingers or toes, or other nerve disorder an unusual or allergic reaction to paclitaxel, alcohol, polyoxyethylated castor oil, other chemotherapy, other medicines, foods, dyes, or preservatives pregnant or trying to  get pregnant breast-feeding How should I use this medication? This drug is given as an infusion into a vein. It is administered in a hospitalor clinic by a specially trained health care professional. Talk to your pediatrician regarding the use of this medicine in children.Special care may be needed. Overdosage: If you think you have taken too much of this medicine contact apoison control center or emergency room at once. NOTE: This medicine is only for you. Do not share this medicine with others. What if I miss a dose? It is important not to miss your dose. Call your doctor or health careprofessional if you are unable to keep an appointment. What may interact with this medication? Do not take this medicine with any of the following medications: live virus vaccines This medicine may also interact with the following medications: antiviral medicines for hepatitis, HIV or AIDS certain antibiotics like erythromycin and clarithromycin certain medicines for fungal infections like ketoconazole and itraconazole certain medicines for seizures like carbamazepine, phenobarbital, phenytoin gemfibrozil nefazodone rifampin St. John's wort This list may not describe all possible interactions. Give your health care provider a list of all the medicines, herbs, non-prescription drugs, or dietary supplements you use. Also tell them if you smoke,  drink alcohol, or use illegaldrugs. Some items may interact with your medicine. What should I watch for while using this medication? Your condition will be monitored carefully while you are receiving this medicine. You will need important blood work done while you are taking thismedicine. This medicine can cause serious allergic reactions. To reduce your risk you will need to take other medicine(s) before treatment with this medicine. If you experience allergic reactions like skin rash, itching or hives, swelling of theface, lips, or tongue, tell your doctor or health  care professional right away. In some cases, you may be given additional medicines to help with side effects.Follow all directions for their use. This drug may make you feel generally unwell. This is not uncommon, as chemotherapy can affect healthy cells as well as cancer cells. Report any side effects. Continue your course of treatment even though you feel ill unless yourdoctor tells you to stop. Call your doctor or health care professional for advice if you get a fever, chills or sore throat, or other symptoms of a cold or flu. Do not treat yourself. This drug decreases your body's ability to fight infections. Try toavoid being around people who are sick. This medicine may increase your risk to bruise or bleed. Call your doctor orhealth care professional if you notice any unusual bleeding. Be careful brushing and flossing your teeth or using a toothpick because you may get an infection or bleed more easily. If you have any dental work done,tell your dentist you are receiving this medicine. Avoid taking products that contain aspirin, acetaminophen, ibuprofen, naproxen, or ketoprofen unless instructed by your doctor. These medicines may hide afever. Do not become pregnant while taking this medicine. Women should inform their doctor if they wish to become pregnant or think they might be pregnant. There is a potential for serious side effects to an unborn child. Talk to your health care professional or pharmacist for more information. Do not breast-feed aninfant while taking this medicine. Men are advised not to father a child while receiving this medicine. This product may contain alcohol. Ask your pharmacist or healthcare provider if this medicine contains alcohol. Be sure to tell all healthcare providers you are taking this medicine. Certain medicines, like metronidazole and disulfiram, can cause an unpleasant reaction when taken with alcohol. The reaction includes flushing, headache, nausea, vomiting,  sweating, and increased thirst. Thereaction can last from 30 minutes to several hours. What side effects may I notice from receiving this medication? Side effects that you should report to your doctor or health care professionalas soon as possible: allergic reactions like skin rash, itching or hives, swelling of the face, lips, or tongue breathing problems changes in vision fast, irregular heartbeat high or low blood pressure mouth sores pain, tingling, numbness in the hands or feet signs of decreased platelets or bleeding - bruising, pinpoint red spots on the skin, black, tarry stools, blood in the urine signs of decreased red blood cells - unusually weak or tired, feeling faint or lightheaded, falls signs of infection - fever or chills, cough, sore throat, pain or difficulty passing urine signs and symptoms of liver injury like dark yellow or brown urine; general ill feeling or flu-like symptoms; light-colored stools; loss of appetite; nausea; right upper belly pain; unusually weak or tired; yellowing of the eyes or skin swelling of the ankles, feet, hands unusually slow heartbeat Side effects that usually do not require medical attention (report to yourdoctor or health care professional if they continue or are bothersome): diarrhea  hair loss loss of appetite muscle or joint pain nausea, vomiting pain, redness, or irritation at site where injected tiredness This list may not describe all possible side effects. Call your doctor for medical advice about side effects. You may report side effects to FDA at1-800-FDA-1088. Where should I keep my medication? This drug is given in a hospital or clinic and will not be stored at home. NOTE: This sheet is a summary. It may not cover all possible information. If you have questions about this medicine, talk to your doctor, pharmacist, orhealth care provider.  2022 Elsevier/Gold Standard (2019-03-18 13:37:23)  Carboplatin injection What is this  medication? CARBOPLATIN (KAR boe pla tin) is a chemotherapy drug. It targets fast dividing cells, like cancer cells, and causes these cells to die. This medicine is usedto treat ovarian cancer and many other cancers. This medicine may be used for other purposes; ask your health care provider orpharmacist if you have questions. COMMON BRAND NAME(S): Paraplatin What should I tell my care team before I take this medication? They need to know if you have any of these conditions: blood disorders hearing problems kidney disease recent or ongoing radiation therapy an unusual or allergic reaction to carboplatin, cisplatin, other chemotherapy, other medicines, foods, dyes, or preservatives pregnant or trying to get pregnant breast-feeding How should I use this medication? This drug is usually given as an infusion into a vein. It is administered in [facility] or clinic by a specially trained health care professional. Talk to your pediatrician regarding the use of this medicine in children.Special care may be needed. Overdosage: If you think you have taken too much of this medicine contact apoison control center or emergency room at once. NOTE: This medicine is only for you. Do not share this medicine with others. What if I miss a dose? It is important not to miss a dose. Call your doctor or health careprofessional if you are unable to keep an appointment. What may interact with this medication? medicines for seizures medicines to increase blood counts like filgrastim, pegfilgrastim, sargramostim some antibiotics like amikacin, gentamicin, neomycin, streptomycin, tobramycin vaccines Talk to your doctor or health care professional before taking any of thesemedicines: acetaminophen aspirin ibuprofen ketoprofen naproxen This list may not describe all possible interactions. Give your health care provider a list of all the medicines, herbs, non-prescription drugs, or dietary supplements you use. Also  tell them if you smoke, drink alcohol, or use illegaldrugs. Some items may interact with your medicine. What should I watch for while using this medication? Your condition will be monitored carefully while you are receiving this medicine. You will need important blood work done while you are taking thismedicine. This drug may make you feel generally unwell. This is not uncommon, as chemotherapy can affect healthy cells as well as cancer cells. Report any side effects. Continue your course of treatment even though you feel ill unless yourdoctor tells you to stop. In some cases, you may be given additional medicines to help with side effects.Follow all directions for their use. Call your doctor or health care professional for advice if you get a fever, chills or sore throat, or other symptoms of a cold or flu. Do not treat yourself. This drug decreases your body's ability to fight infections. Try toavoid being around people who are sick. This medicine may increase your risk to bruise or bleed. Call your doctor orhealth care professional if you notice any unusual bleeding. Be careful brushing and flossing your teeth or using a toothpick because  you may get an infection or bleed more easily. If you have any dental work done,tell your dentist you are receiving this medicine. Avoid taking products that contain aspirin, acetaminophen, ibuprofen, naproxen, or ketoprofen unless instructed by your doctor. These medicines may hide afever. Do not become pregnant while taking this medicine. Women should inform their doctor if they wish to become pregnant or think they might be pregnant. There is a potential for serious side effects to an unborn child. Talk to your health care professional or pharmacist for more information. Do not breast-feed aninfant while taking this medicine. What side effects may I notice from receiving this medication? Side effects that you should report to your doctor or health care  professionalas soon as possible: allergic reactions like skin rash, itching or hives, swelling of the face, lips, or tongue signs of infection - fever or chills, cough, sore throat, pain or difficulty passing urine signs of decreased platelets or bleeding - bruising, pinpoint red spots on the skin, black, tarry stools, nosebleeds signs of decreased red blood cells - unusually weak or tired, fainting spells, lightheadedness breathing problems changes in hearing changes in vision chest pain high blood pressure low blood counts - This drug may decrease the number of white blood cells, red blood cells and platelets. You may be at increased risk for infections and bleeding. nausea and vomiting pain, swelling, redness or irritation at the injection site pain, tingling, numbness in the hands or feet problems with balance, talking, walking trouble passing urine or change in the amount of urine Side effects that usually do not require medical attention (report to yourdoctor or health care professional if they continue or are bothersome): hair loss loss of appetite metallic taste in the mouth or changes in taste This list may not describe all possible side effects. Call your doctor for medical advice about side effects. You may report side effects to FDA at1-800-FDA-1088. Where should I keep my medication? This drug is given in a hospital or clinic and will not be stored at home. NOTE: This sheet is a summary. It may not cover all possible information. If you have questions about this medicine, talk to your doctor, pharmacist, orhealth care provider.  2022 Elsevier/Gold Standard (2007-07-22 14:38:05)

## 2020-11-10 NOTE — Progress Notes (Signed)
Pt here for poss chemotherapy today. Chronic fatigue. Appetite fair, up and down. Sleeps well. 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

## 2020-11-10 NOTE — Progress Notes (Signed)
[place]  [phone] [phone]   Name: Donald Lowery Date: 11/10/2020 MRN: [id number]  DOB: 1951/03/05  Patient Care Team: [name], NP as PCP - General (Internal Medicine) [name], MD as PCP - Cardiology (Cardiology) Telford Nab, RN as Oncology Nurse Navigator    REASON FOR CONSULTATION: Donald Lowery is a 70 y.o. 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  Patient has had significant left shoulder pain.  PET/CT demonstrated a 7.1 cm left apical mass, which abuts the mediastinum encasing the left subclavian and vertebral arteries and is felt probably to have nerve impingement leading to referred pain.  He also has vocal cord paralysis.  Patient was referred to palliative care to help address goals to manage ongoing symptoms.  SOCIAL HISTORY:     reports that he quit smoking about 2 years ago. His smoking use included cigarettes. He has a 45.00 pack-year smoking history. He has never used smokeless tobacco. He reports that he does not drink alcohol and does not use drugs.  Patient is married and lives at home with his wife.  He had a son who is now deceased.  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  ADVANCE DIRECTIVES:  Not on file  CODE STATUS:   PAST MEDICAL HISTORY: Past Medical History:  Diagnosis Date   Aortic atherosclerosis ([place])    [place] hernia 09/21/2020   fatty   Coronary artery disease    a. 04/2018 NSTEMI/Cath: LM nl, LAD 50p, 21md diffuse-small caliber, LCX heavily Ca2+ sev prox/mid dzs, RCA 90 diff distal dzs into RPL and RPDA. EF 50-55%-->Med Rx.   Hepatic steatosis    History of 2019 novel coronavirus disease (COVID-19) 10/12/2020   History of echocardiogram    a. 04/2018 Echo: EF 55-60%, no rwma, mild MR, mildly dil LA. Nl RV fxn.   Hyperlipidemia     Hypertension    NSTEMI (non-ST elevated myocardial infarction) ([facility] 05/15/2018   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[facility] 09/21/2020   a.) 7 cm LUL mass with left subclavian/proximal vertebral encasement   Paraseptal emphysema ([facility])    T2[place] (type 2 diabetes mellitus) ([facility]    Tobacco abuse    a. Quit 04/2018.    PAST SURGICAL HISTORY:  Past Surgical History:  Procedure Laterality Date   BRAIN SURGERY     CARDIAC CATHETERIZATION     IR IMAGING GUIDED PORT INSERTION  10/28/2020   LEFT HEART CATH AND CORONARY ANGIOGRAPHY N/A 05/16/2018   Procedure: LEFT HEART CATH AND CORONARY ANGIOGRAPHY poss PCI;  Surgeon: GMinna Merritts MD;  [facility];  Service: Cardiovascular;  Laterality: N/A;   VIDEO BRONCHOSCOPY WITH ENDOBRONCHIAL NAVIGATION N/A 10/24/2020   Procedure: ROBOTIC ASSISTED VIDEO BRONCHOSCOPY WITH ENDOBRONCHIAL NAVIGATION;  Surgeon: GTyler Pita MD;  Location: [facility];  Service: Pulmonary;  Laterality: N/A;    HEMATOLOGY/ONCOLOGY HISTORY:  Oncology History  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[facility]  09/21/2020 Initial Diagnosis   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[facility]   10/28/2020 Cancer Staging   Staging form: Lung, AJCC 8th Edition - Clinical stage from 10/28/2020: Stage IVA (cT4, cN0, pM1a) - Signed by FLloyd Huger MD on 10/28/2020   11/03/2020 -  Chemotherapy    Patient is on Treatment Plan: LUNG CARBOPLATIN / PACLITAXEL + XRT Q7D        ALLERGIES:  has No Known Allergies.  MEDICATIONS:  Current Outpatient Medications  Medication Sig Dispense Refill   aspirin EC 81 MG EC tablet Take 1 tablet (81 mg total) by mouth daily. 30 tablet 0   atorvastatin (LIPITOR) 80 MG tablet Take 1 tablet (80 mg total) by mouth daily at 6 PM. 90 tablet 0   dicyclomine (BENTYL) 10 MG capsule Take 1 capsule (10 mg total) by mouth 3 (three) times daily as needed for up to 14 days for spasms. 20 capsule 0   ezetimibe (ZETIA) 10 MG tablet Take 1 tablet (10 mg total) by  mouth daily. 90 tablet 0   gabapentin (NEURONTIN) 300 MG capsule Take 1 capsule (300 mg total) by mouth 3 (three) times daily. 90 capsule 0   icosapent Ethyl (VASCEPA) 1 g capsule Take 2 capsules (2 g total) by mouth 2 (two) times daily. 120 capsule 6   isosorbide mononitrate (IMDUR) 30 MG 24 hr tablet Take 1 tablet (30 mg total) by mouth daily. 90 tablet 3   lidocaine-prilocaine (EMLA) cream Apply to affected area once 30 g 3   losartan (COZAAR) 25 MG tablet Take 1 tablet (25 mg total) by mouth daily. 90 tablet 1   Menthol-Methyl Salicylate (ICY HOT) 65-68 % STCK Apply 1 application topically daily as needed (shoulder pain).     metFORMIN (GLUCOPHAGE) 500 MG tablet Take 500 mg by mouth every morning.     nitroGLYCERIN (NITROSTAT) 0.4 MG SL tablet Place 1 tablet (0.4 mg total) under the tongue every 5 (five) minutes as needed for chest pain. (Patient not taking: No sig reported) 25 tablet 3   ondansetron (ZOFRAN) 8 MG tablet Take 1 tablet (8 mg total) by mouth 2 (two) times daily as needed for refractory nausea / vomiting. (Patient not taking: Reported on 11/10/2020) 60 tablet 1   Oxycodone HCl 10 MG TABS Take 10 mg by mouth every 4 (four) hours as needed.     prochlorperazine (COMPAZINE) 10 MG tablet Take 1 tablet (10 mg total) by mouth every 6 (six) hours as needed (Nausea or vomiting). (Patient not taking: Reported on 11/10/2020) 60 tablet 1   vitamin B-12 (CYANOCOBALAMIN) 1000 MCG tablet Take 1,000 mcg by mouth daily. (Patient not taking: Reported on 11/10/2020)     No current facility-administered medications for this visit.   Facility-Administered Medications Ordered in Other Visits  Medication Dose Route Frequency Provider Last Rate Last Admin   CARBOplatin (PARAPLATIN) 160 mg in sodium chloride 0.9 % 100 mL chemo infusion  160 mg Intravenous Once Lloyd Huger, MD       famotidine (PEPCID) IVPB 20 mg in NS 100 mL IVPB  20 mg Intravenous Once Lloyd Huger, MD 400 mL/hr at 11/10/20  1039 20 mg at 11/10/20 1039   heparin lock flush 100 unit/mL  500 Units Intracatheter Once PRN Lloyd Huger, MD       PACLitaxel (TAXOL) 78 mg in sodium chloride 0.9 % 250 mL chemo infusion (</= 2m/m2)  45 mg/m2 (Treatment Plan Recorded) Intravenous Once FLloyd Huger MD       sodium chloride flush (NS) 0.9 % injection 10 mL  10 mL Intracatheter PRN FLloyd Huger MD   10 mL at 11/10/20 1015   Zoledronic Acid (ZOMETA) IVPB 4 mg  4 mg Intravenous Once AVerlon Au NP        VITAL SIGNS: There were no vitals taken for this visit. There were no vitals filed for this visit.  Estimated  body mass index is 20.47 kg/m as calculated from the following:   Height as of 10/28/20: _0  (1.676 m).   Weight as of an earlier encounter on 11/10/20: 126 lb 12.8 oz (57.5 kg).  LABS: CBC:    Component Value Date/Time   WBC 14.4 (H) 11/10/2020 0859   HGB 10.1 (L) 11/10/2020 0859   HCT 29.8 (L) 11/10/2020 0859   PLT 393 11/10/2020 0859   MCV 83.0 11/10/2020 0859   NEUTROABS 12.4 (H) 11/10/2020 0859   LYMPHSABS 1.0 11/10/2020 0859   MONOABS 0.8 11/10/2020 0859   EOSABS 0.1 11/10/2020 0859   BASOSABS 0.1 11/10/2020 0859   Comprehensive Metabolic Panel:    Component Value Date/Time   NA 125 (L) 11/10/2020 0859   NA 138 12/16/2018 0933   K 3.8 11/10/2020 0859   CL 92 (L) 11/10/2020 0859   CO2 27 11/10/2020 0859   BUN 18 11/10/2020 0859   BUN 14 12/16/2018 0933   CREATININE 0.97 11/10/2020 0859   GLUCOSE 144 (H) 11/10/2020 0859   CALCIUM 11.0 (H) 11/10/2020 0859   AST 23 11/10/2020 0859   ALT 21 11/10/2020 0859   ALKPHOS 98 11/10/2020 0859   BILITOT 0.9 11/10/2020 0859   PROT 6.9 11/10/2020 0859   ALBUMIN 3.3 (L) 11/10/2020 0859    RADIOGRAPHIC STUDIES: DG Chest Port 1 View  Result Date: 10/24/2020 CLINICAL DATA:  Left upper lobe lung biopsy. EXAM: PORTABLE CHEST 1 VIEW COMPARISON:  Sep 21, 2020. FINDINGS: Normal cardiac size. Stable appearance of left upper lobe  mass. No pneumothorax or pleural effusion is noted. Several probable pulmonary nodules are noted. Bony thorax is unremarkable. IMPRESSION: No pneumothorax status post left upper lobe lung biopsy. Electronically Signed   By: Marijo Conception M.D.   On: 10/24/2020 15:47   DG C-Arm 1-60 Min-No Report  Result Date: 10/24/2020 Fluoroscopy was utilized by the requesting physician.  No radiographic interpretation.   IR IMAGING GUIDED PORT INSERTION  Result Date: 10/28/2020 INDICATION: 70 year old male referred for port catheter placement EXAM: IMAGE GUIDED PORT CATHETER PLACEMENT MEDICATIONS: None ANESTHESIA/SEDATION: Moderate (conscious) sedation was employed during this procedure. A total of Versed 1.0 mg and Fentanyl 50 mcg was administered intravenously. Moderate Sedation Time: 20 minutes. The patient's level of consciousness and vital signs were monitored continuously by radiology nursing throughout the procedure under my direct supervision. FLUOROSCOPY TIME:  Fluoroscopy Time: 0 minutes 6 seconds (0.57 mGy). COMPLICATIONS: None PROCEDURE: The procedure, risks, benefits, and alternatives were explained to the patient. Questions regarding the procedure were encouraged and answered. The patient understands and consents to the procedure. Ultrasound survey was performed with images stored and sent to PACs. The right neck and chest was prepped with chlorhexidine, and draped in the usual sterile fashion using maximum barrier technique (cap and mask, sterile gown, sterile gloves, large sterile sheet, hand hygiene and cutaneous antiseptic). Local anesthesia was attained by infiltration with 1% lidocaine without epinephrine. Ultrasound demonstrated patency of the right internal jugular vein, and this was documented with an image. Under real-time ultrasound guidance, this vein was accessed with a 21 gauge micropuncture needle and image documentation was performed. A small dermatotomy was made at the access site with an 11  scalpel. A 0.018" wire was advanced into the SVC and used to estimate the length of the internal catheter. The access needle exchanged for a 62F micropuncture vascular sheath. The 0.018" wire was then removed and a 0.035" wire advanced into the IVC. An appropriate location for the subcutaneous  reservoir was selected below the clavicle and an incision was made through the skin and underlying soft tissues. The subcutaneous tissues were then dissected using a combination of blunt and sharp surgical technique and a pocket was formed. A single lumen power injectable portacatheter was then tunneled through the subcutaneous tissues from the pocket to the dermatotomy and the port reservoir placed within the subcutaneous pocket. The venous access site was then serially dilated and a peel away vascular sheath placed over the wire. The wire was removed and the port catheter advanced into position under fluoroscopic guidance. The catheter tip is positioned in the cavoatrial junction. This was documented with a spot image. The portacatheter was then tested and found to flush and aspirate well. The port was flushed with saline followed by 100 units/mL heparinized saline. The pocket was then closed in two layers using first subdermal inverted interrupted absorbable sutures followed by a running subcuticular suture. The epidermis was then sealed with Dermabond. The dermatotomy at the venous access site was also seal with Dermabond. Patient tolerated the procedure well and remained hemodynamically stable throughout. No complications encountered and no significant blood loss encountered IMPRESSION: Status post right IJ port catheter placement. Signed, Dulcy Fanny. Dellia Nims, RPVI [facility] Electronically Signed   By: Corrie Mckusick D.O.   On: 10/28/2020 11:30    PERFORMANCE STATUS (ECOG) : 1 - Symptomatic but completely ambulatory  Review of Systems Unless otherwise noted,  a complete review of systems is negative.  Physical Exam General: NAD Pulmonary: Unlabored Extremities: no edema, no joint deformities Skin: no rashes Neurological: Weakness but otherwise nonfocal  IMPRESSION: I met with patient in infusion.   Patient reports that his pain is overall improved with XRT.  He continues to endorse shoulder pain but now says that it is at most 5 or 6 out of 10.  He has continued to take oxycodone for breakthrough pain but less frequently than before.  As pain appears to be improving, we will hold on starting a long-acting.  We will refill oxycodone.  Patient denies other significant changes or concerns.  His appetite is somewhat poor.  And weight has been downtrending over the past month.  Will refer to nutritionist.  Patient will benefit from conversation regarding advance care planning.  PLAN: -Continue current scope of treatment -Continue gabapentin/oxycodone -Continue daily bowel regimen -Recommend starting oral nutritional supplements -Referral for nutrition -Will benefit from ACP conversation -Follow-up MyChart visit 1 month   Patient expressed understanding and was in agreement with this plan. He also understands that He can call the clinic at any time with any questions, concerns, or complaints.     Time Total: 15 minutes  Visit consisted of counseling and education dealing with the complex and emotionally intense issues of symptom management and palliative care in the setting of serious and potentially life-threatening illness.Greater than 50%  of this time was spent counseling and coordinating care related to the above assessment and plan.  Signed by: Altha Harm, PhD, NP-C

## 2020-11-10 NOTE — Progress Notes (Signed)
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  INTERVAL HISTORY: Patient returns to clinic for further evaluation and consideration of carboplatin-paclitaxel plus XRT every 7 days.  Continues to have left shoulder pain.  Palliative medicine.  Appetite reduced.  Reports constipation.  He denies neurologic complaints.  No recent fevers or illness.  Denies chest pain, shortness of breath, cough, hemoptysis.  Denies abdominal pain.  No nausea, vomiting, diarrhea.  No urinary complaints.  No further specific complaints today.  REVIEW OF SYSTEMS:   Review of Systems  Constitutional:  Positive for weight loss. Negative for chills, fever and malaise/fatigue.  HENT:  Negative for hearing loss, nosebleeds, sore throat and tinnitus.   Eyes:  Negative for blurred vision and double vision.  Respiratory: Negative.  Negative for cough, hemoptysis, shortness of breath and wheezing.   Cardiovascular: Negative.  Negative for chest pain, palpitations and leg swelling.  Gastrointestinal:  Positive for constipation. Negative for abdominal pain, blood in stool, diarrhea, melena, nausea and vomiting.  Genitourinary: Negative.  Negative for dysuria and urgency.  Musculoskeletal:  Positive for joint pain. Negative for back pain, falls and myalgias.  Skin: Negative.  Negative for itching and rash.  Neurological: Negative.  Negative for dizziness, tingling, sensory change, focal weakness, loss of consciousness, weakness and headaches.  Endo/Heme/Allergies:  Negative for environmental allergies. Does not bruise/bleed easily.  Psychiatric/Behavioral:  Negative for  depression. The patient is not nervous/anxious and does not have insomnia.   As per HPI. Otherwise, a complete review of systems is negative.  PAST MEDICAL HISTORY: Past Medical History:  Diagnosis Date   Aortic atherosclerosis ([place])    [place] hernia 09/21/2020   fatty   Coronary artery disease    a. 04/2018 NSTEMI/Cath: LM nl, LAD 50p, 36m/d diffuse-small caliber, LCX heavily Ca2+ sev prox/mid dzs, RCA 90 diff distal dzs into RPL and RPDA. EF 50-55%-->Med Rx.   Hepatic steatosis    History of 2019 novel coronavirus disease (COVID-19) 10/12/2020   History of echocardiogram    a. 04/2018 Echo: EF 55-60%, no rwma, mild MR, mildly dil LA. Nl RV fxn.   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 Quit 04/2018.    PAST SURGICAL HISTORY: Past Surgical History:  Procedure Laterality Date   BRAIN SURGERY     CARDIAC CATHETERIZATION     IR IMAGING GUIDED PORT INSERTION  10/28/2020   LEFT HEART CATH AND CORONARY ANGIOGRAPHY N/A 05/16/2018   Procedure: LEFT HEART CATH AND CORONARY ANGIOGRAPHY poss PCI;  Surgeon: [name], MD;  Location: [facility];  Service: Cardiovascular;  Laterality: N/A;   VIDEO BRONCHOSCOPY WITH ENDOBRONCHIAL NAVIGATION N/A 10/24/2020   Procedure: ROBOTIC ASSISTED VIDEO BRONCHOSCOPY WITH ENDOBRONCHIAL NAVIGATION;  Surgeon: Tyler Pita, MD;  Location: [facility];  Service: Pulmonary;  Laterality: N/A;    FAMILY HISTORY: Family History  Problem Relation Age of Onset  Heart Problems Mother    Heart attack Father    Diabetes Brother    Heart Problems Brother     ADVANCED DIRECTIVES (Y/N):  N  HEALTH MAINTENANCE: Social History   Tobacco Use   Smoking status: Former     Packs/day: 1.00    Years: 45.00    Pack years: 45.00    Types: Cigarettes    Quit date: 05/15/2018    Years since quitting: 2.4   Smokeless tobacco: Never  Vaping Use   Vaping Use: Never used  Substance Use Topics   Alcohol use: Never   Drug use: Never     Colonoscopy:  Bone density:  Lipid panel:  No Known Allergies  Current Outpatient Medications  Medication Sig Dispense Refill   aspirin EC 81 MG EC tablet Take 1 tablet (81 mg total) by mouth daily. 30 tablet 0   atorvastatin (LIPITOR) 80 MG tablet Take 1 tablet (80 mg total) by mouth daily at 6 PM. 90 tablet 0   ezetimibe (ZETIA) 10 MG tablet Take 1 tablet (10 mg total) by mouth daily. 90 tablet 0   gabapentin (NEURONTIN) 300 MG capsule Take 1 capsule (300 mg total) by mouth 3 (three) times daily. 90 capsule 0   icosapent Ethyl (VASCEPA) 1 g capsule Take 2 capsules (2 g total) by mouth 2 (two) times daily. 120 capsule 6   isosorbide mononitrate (IMDUR) 30 MG 24 hr tablet Take 1 tablet (30 mg total) by mouth daily. 90 tablet 3   lidocaine-prilocaine (EMLA) cream Apply to affected area once 30 g 3   losartan (COZAAR) 25 MG tablet Take 1 tablet (25 mg total) by mouth daily. 90 tablet 1   Menthol-Methyl Salicylate (ICY HOT) 63-33 % STCK Apply 1 application topically daily as needed (shoulder pain).     metFORMIN (GLUCOPHAGE) 500 MG tablet Take 500 mg by mouth every morning.     Oxycodone HCl 10 MG TABS Take 10 mg by mouth every 4 (four) hours as needed.     dicyclomine (BENTYL) 10 MG capsule Take 1 capsule (10 mg total) by mouth 3 (three) times daily as needed for up to 14 days for spasms. 20 capsule 0   nitroGLYCERIN (NITROSTAT) 0.4 MG SL tablet Place 1 tablet (0.4 mg total) under the tongue every 5 (five) minutes as needed for chest pain. (Patient not taking: No sig reported) 25 tablet 3   ondansetron (ZOFRAN) 8 MG tablet Take 1 tablet (8 mg total) by mouth 2 (two) times daily as needed for refractory nausea / vomiting. (Patient  not taking: Reported on 11/10/2020) 60 tablet 1   prochlorperazine (COMPAZINE) 10 MG tablet Take 1 tablet (10 mg total) by mouth every 6 (six) hours as needed (Nausea or vomiting). (Patient not taking: Reported on 11/10/2020) 60 tablet 1   vitamin B-12 (CYANOCOBALAMIN) 1000 MCG tablet Take 1,000 mcg by mouth daily. (Patient not taking: Reported on 11/10/2020)     No current facility-administered medications for this visit.   Facility-Administered Medications Ordered in Other Visits  Medication Dose Route Frequency Provider Last Rate Last Admin   0.9 %  sodium chloride infusion   Intravenous Once Grayland Ormond, Kathlene November, MD       CARBOplatin (PARAPLATIN) 160 mg in sodium chloride 0.9 % 100 mL chemo infusion  160 mg Intravenous Once Lloyd Huger, MD       dexamethasone (DECADRON) 10 mg in sodium chloride 0.9 % 50 mL IVPB  10 mg Intravenous Once Finnegan, Timothy J,  MD       diphenhydrAMINE (BENADRYL) injection 25 mg  25 mg Intravenous Once Lloyd Huger, MD       famotidine (PEPCID) IVPB 20 mg in NS 100 mL IVPB  20 mg Intravenous Once Lloyd Huger, MD       heparin lock flush 100 unit/mL  500 Units Intracatheter Once PRN Lloyd Huger, MD       PACLitaxel (TAXOL) 78 mg in sodium chloride 0.9 % 250 mL chemo infusion (</= 80mg /m2)  45 mg/m2 (Treatment Plan Recorded) Intravenous Once Lloyd Huger, MD       palonosetron (ALOXI) injection 0.25 mg  0.25 mg Intravenous Once Lloyd Huger, MD       sodium chloride flush (NS) 0.9 % injection 10 mL  10 mL Intracatheter PRN Lloyd Huger, MD        OBJECTIVE: Vitals:   11/10/20 0921  BP: 122/64  Pulse: 97  Resp: 17  Temp: (!) 96 F (35.6 C)  SpO2: 99%     Body mass index is 20.47 kg/m.     ECOG FS:1 - Symptomatic but completely ambulatory  General: Well-developed, well-nourished, no acute distress. Eyes: Pink conjunctiva, anicteric sclera. Lungs: Clear to auscultation bilaterally.  No audible wheezing or  coughing Heart: Regular rate and rhythm.  Abdomen: Soft, nontender, nondistended.  Musculoskeletal: No edema, cyanosis, or clubbing. Neuro: Alert, answering all questions appropriately. Cranial nerves grossly intact. Skin: No rashes or petechiae noted. Psych: Normal affect.    LAB RESULTS:  Lab Results  Component Value Date   NA 125 (L) 11/10/2020   K 3.8 11/10/2020   CL 92 (L) 11/10/2020   CO2 27 11/10/2020   GLUCOSE 144 (H) 11/10/2020   BUN 18 11/10/2020   CREATININE 0.97 11/10/2020   CALCIUM 11.0 (H) 11/10/2020   PROT 6.9 11/10/2020   ALBUMIN 3.3 (L) 11/10/2020   AST 23 11/10/2020   ALT 21 11/10/2020   ALKPHOS 98 11/10/2020   BILITOT 0.9 11/10/2020   GFRNONAA >60 11/10/2020   GFRAA >60 07/14/2019    Lab Results  Component Value Date   WBC 14.4 (H) 11/10/2020   NEUTROABS 12.4 (H) 11/10/2020   HGB 10.1 (L) 11/10/2020   HCT 29.8 (L) 11/10/2020   MCV 83.0 11/10/2020   PLT 393 11/10/2020     STUDIES: DG Chest Port 1 View  Result Date: 10/24/2020 CLINICAL DATA:  Left upper lobe lung biopsy. EXAM: PORTABLE CHEST 1 VIEW COMPARISON:  Sep 21, 2020. FINDINGS: Normal cardiac size. Stable appearance of left upper lobe mass. No pneumothorax or pleural effusion is noted. Several probable pulmonary nodules are noted. Bony thorax is unremarkable. IMPRESSION: No pneumothorax status post left upper lobe lung biopsy. Electronically Signed   By: Marijo Conception M.D.   On: 10/24/2020 15:47   DG C-Arm 1-60 Min-No Report  Result Date: 10/24/2020 Fluoroscopy was utilized by the requesting physician.  No radiographic interpretation.   IR IMAGING GUIDED PORT INSERTION  Result Date: 10/28/2020 INDICATION: 70 year old male referred for port catheter placement EXAM: IMAGE GUIDED PORT CATHETER PLACEMENT MEDICATIONS: None ANESTHESIA/SEDATION: Moderate (conscious) sedation was employed during this procedure. A total of Versed 1.0 mg and Fentanyl 50 mcg was administered intravenously. Moderate  Sedation Time: 20 minutes. The patient's level of consciousness and vital signs were monitored continuously by radiology nursing throughout the procedure under my direct supervision. FLUOROSCOPY TIME:  Fluoroscopy Time: 0 minutes 6 seconds (0.57 mGy). COMPLICATIONS: None PROCEDURE: The procedure, risks, benefits, and alternatives  were explained to the patient. Questions regarding the procedure were encouraged and answered. The patient understands and consents to the procedure. Ultrasound survey was performed with images stored and sent to PACs. The right neck and chest was prepped with chlorhexidine, and draped in the usual sterile fashion using maximum barrier technique (cap and mask, sterile gown, sterile gloves, large sterile sheet, hand hygiene and cutaneous antiseptic). Local anesthesia was attained by infiltration with 1% lidocaine without epinephrine. Ultrasound demonstrated patency of the right internal jugular vein, and this was documented with an image. Under real-time ultrasound guidance, this vein was accessed with a 21 gauge micropuncture needle and image documentation was performed. A small dermatotomy was made at the access site with an 11 scalpel. A 0.018" wire was advanced into the SVC and used to estimate the length of the internal catheter. The access needle exchanged for a 53F micropuncture vascular sheath. The 0.018" wire was then removed and a 0.035" wire advanced into the IVC. An appropriate location for the subcutaneous reservoir was selected below the clavicle and an incision was made through the skin and underlying soft tissues. The subcutaneous tissues were then dissected using a combination of blunt and sharp surgical technique and a pocket was formed. A single lumen power injectable portacatheter was then tunneled through the subcutaneous tissues from the pocket to the dermatotomy and the port reservoir placed within the subcutaneous pocket. The venous access site was then serially  dilated and a peel away vascular sheath placed over the wire. The wire was removed and the port catheter advanced into position under fluoroscopic guidance. The catheter tip is positioned in the cavoatrial junction. This was documented with a spot image. The portacatheter was then tested and found to flush and aspirate well. The port was flushed with saline followed by 100 units/mL heparinized saline. The pocket was then closed in two layers using first subdermal inverted interrupted absorbable sutures followed by a running subcuticular suture. The epidermis was then sealed with Dermabond. The dermatotomy at the venous access site was also seal with Dermabond. Patient tolerated the procedure well and remained hemodynamically stable throughout. No complications encountered and no significant blood loss encountered IMPRESSION: Status post right IJ port catheter placement. Signed, Dulcy Fanny. Dellia Nims, RPVI [facility] Rad[facility] Electronically Signed   By: Corrie Mckusick D.O.   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  PLAN:    1. 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 Pathology consistent with squamous cell carcinoma. PET scan from 10/04/2020 revealed hypermetabolic bilateral pulmonary nodules without mediastinal or hilar lymphadenopathy.  Imaging suggestive of stage IV disease however, patient being treated as stage IIIa.  MRI of the brain was negative for metastatic disease.  Patient had port placed for administration of chemotherapy.  Currently status post cycle 1 of concurrent carboplatinum-Taxol with daily XRT.  Tolerating treatment well without significant side effects.  Proceed with cycle 2 of carboplatinum and Taxol today.  Plan to reimage following completion completion of XRT and if patient has resolution of bilateral pulmonary nodules, will consider  maintenance immunotherapy for 1 year.  Return to clinic in 1 week for evaluation and consideration of cycle 3 of weekly carboplatinum and Taxol. 2.  Shoulder pain: XRT as above.  Radiation oncology as above.  Managed by palliative care 3.  Weight loss: awaiting appt with Jennet Maduro scheduled for 11/17/20.  4.  Hyponatremia:  Na 125. Suspect SIADH. Will check urine sodium and urine osmolality today.  5. Constipation: managed by palliative care, r/t opioid use. Encouraged liberal use of miralax.  6. Hypercalcemia- question paraneoplastic syndrome. Will check pth and PTHrP. Plan for Zometa 4 mg today. Corrected calcium 11.57. Not on calcium or vitamin d supplements. No thiazide diuretics. Monitor closely.    Patient expressed understanding and was in agreement with this plan. He also understands that He can call clinic at any time with any questions, concerns, or complaints.   Cancer Staging Non-small cell cancer of left lung [facility]) Staging form: Lung, AJCC 8th Edition - Clinical stage from 10/28/2020: Stage IVA (cT4, cN0, pM1a) - Signed by Lloyd Huger, MD on 10/28/2020  Verlon Au, NP   11/10/2020 10:17 AM

## 2020-11-10 NOTE — Telephone Encounter (Signed)
Lab appointment moved to 8am prior to radiation per [place].  Left voicemail notifying patient of change.

## 2020-11-11 ENCOUNTER — Encounter: Payer: Self-pay | Admitting: Nurse Practitioner

## 2020-11-11 ENCOUNTER — Ambulatory Visit
Admission: RE | Admit: 2020-11-11 | Discharge: 2020-11-11 | Disposition: A | Discharge status: Home / Self Care | Payer: Medicare HMO | Source: Ambulatory Visit | Attending: Radiation Oncology | Admitting: Radiation Oncology

## 2020-11-11 ENCOUNTER — Encounter: Payer: Self-pay | Admitting: Oncology

## 2020-11-11 DIAGNOSIS — Z510 Encounter for antineoplastic radiation therapy: Secondary | ICD-10-CM | POA: Diagnosis not present

## 2020-11-11 LAB — PARATHYROID HORMONE, INTACT (NO CA): PTH: 12 pg/mL — ABNORMAL LOW (ref 15–65)

## 2020-11-14 ENCOUNTER — Ambulatory Visit
Admission: RE | Admit: 2020-11-14 | Discharge: 2020-11-14 | Disposition: A | Discharge status: Home / Self Care | Payer: Medicare HMO | Source: Ambulatory Visit | Attending: Radiation Oncology | Admitting: Radiation Oncology

## 2020-11-14 DIAGNOSIS — Z510 Encounter for antineoplastic radiation therapy: Secondary | ICD-10-CM | POA: Diagnosis not present

## 2020-11-15 ENCOUNTER — Other Ambulatory Visit: Payer: Self-pay | Admitting: *Deleted

## 2020-11-15 ENCOUNTER — Ambulatory Visit
Admission: RE | Admit: 2020-11-15 | Discharge: 2020-11-15 | Disposition: A | Discharge status: Home / Self Care | Payer: Medicare HMO | Source: Ambulatory Visit | Attending: Radiation Oncology | Admitting: Radiation Oncology

## 2020-11-15 DIAGNOSIS — Z510 Encounter for antineoplastic radiation therapy: Secondary | ICD-10-CM | POA: Diagnosis not present

## 2020-11-15 MED ORDER — SUCRALFATE 1 G PO TABS: 0.5000 g | ORAL_TABLET | Freq: Three times a day (TID) | ORAL | 5 refills | Status: AC

## 2020-11-15 NOTE — Progress Notes (Signed)
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  INTERVAL HISTORY: Patient returns to clinic today for further evaluation and consideration of cycle 3 of weekly carboplatinum and Taxol.  He also has daily XRT.  He is tolerating his treatments well without significant side effects.  Pain in the shoulder has improved, although he continues to have hoarseness of voice.  He has no neurologic complaints.  He denies any recent fevers or illnesses.  He has a good appetite and denies weight loss.  He denies any chest pain, shortness of breath, cough, or hemoptysis.  He denies any abdominal pain.  He has no nausea, vomiting, constipation, or diarrhea.  He has no urinary complaints.  Patient offers no further specific complaints today.  REVIEW OF SYSTEMS:   Review of Systems  Constitutional: Negative.  Negative for fever, malaise/fatigue and weight loss.  Respiratory: Negative.  Negative for cough, hemoptysis and shortness of breath.   Cardiovascular: Negative.  Negative for chest pain and leg swelling.  Gastrointestinal:  Negative for abdominal pain.  Genitourinary: Negative.  Negative for dysuria.  Musculoskeletal:  Positive for joint pain.  Skin: Negative.  Negative for rash.  Neurological: Negative.  Negative for dizziness, focal weakness, weakness and headaches.   As per HPI. Otherwise, a complete review of systems is negative.  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 04/2018 NSTEMI/Cath: LM nl, LAD 50p, 39m/d diffuse-small  caliber, LCX heavily Ca2+ sev prox/mid dzs, RCA 90 diff distal dzs into RPL and RPDA. EF 50-55%-->Med Rx.   Hepatic steatosis    History of 2019 novel coronavirus disease (COVID-19) 10/12/2020   History of echocardiogram    a. 04/2018 Echo: EF 55-60%, no rwma, mild MR, mildly dil LA. Nl RV fxn.   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 Quit 04/2018.    PAST SURGICAL HISTORY: Past Surgical History:  Procedure Laterality Date   BRAIN SURGERY     CARDIAC CATHETERIZATION     IR IMAGING GUIDED PORT INSERTION  10/28/2020   LEFT HEART CATH AND CORONARY ANGIOGRAPHY N/A 05/16/2018   Procedure: LEFT HEART CATH AND CORONARY ANGIOGRAPHY poss PCI;  Surgeon: [name], MD;  Location: [facility];  Service: Cardiovascular;  Laterality: N/A;   VIDEO BRONCHOSCOPY WITH ENDOBRONCHIAL NAVIGATION N/A 10/24/2020   Procedure: ROBOTIC ASSISTED VIDEO BRONCHOSCOPY WITH ENDOBRONCHIAL NAVIGATION;  Surgeon: Tyler Pita, MD;  Location: [facility];  Service: Pulmonary;  Laterality: N/A;    FAMILY HISTORY: Family History  Problem Relation Age of Onset   Heart Problems Mother    Heart attack Father    Diabetes Brother    Heart Problems Brother     ADVANCED DIRECTIVES (Y/N):  N  HEALTH MAINTENANCE: Social History   Tobacco Use  Smoking status: Former    Packs/day: 1.00    Years: 45.00    Pack years: 45.00    Types: Cigarettes    Quit date: 05/15/2018    Years since quitting: 2.5   Smokeless tobacco: Never  Vaping Use   Vaping Use: Never used  Substance Use Topics   Alcohol use: Never   Drug use: Never     Colonoscopy:  PAP:  Bone density:  Lipid panel:  No Known Allergies  Current Outpatient Medications   Medication Sig Dispense Refill   aspirin EC 81 MG EC tablet Take 1 tablet (81 mg total) by mouth daily. 30 tablet 0   atorvastatin (LIPITOR) 80 MG tablet Take 1 tablet (80 mg total) by mouth daily at 6 PM. 90 tablet 0   ezetimibe (ZETIA) 10 MG tablet Take 1 tablet (10 mg total) by mouth daily. 90 tablet 0   gabapentin (NEURONTIN) 300 MG capsule Take 1 capsule (300 mg total) by mouth 3 (three) times daily. 90 capsule 0   icosapent Ethyl (VASCEPA) 1 g capsule Take 2 capsules (2 g total) by mouth 2 (two) times daily. 120 capsule 6   isosorbide mononitrate (IMDUR) 30 MG 24 hr tablet Take 1 tablet (30 mg total) by mouth daily. 90 tablet 3   lidocaine-prilocaine (EMLA) cream Apply to affected area once 30 g 3   losartan (COZAAR) 25 MG tablet Take 1 tablet (25 mg total) by mouth daily. 90 tablet 1   Menthol-Methyl Salicylate (ICY HOT) 16-10 % STCK Apply 1 application topically daily as needed (shoulder pain).     metFORMIN (GLUCOPHAGE) 500 MG tablet Take 500 mg by mouth every morning.     Oxycodone HCl 10 MG TABS Take 1 tablet (10 mg total) by mouth every 4 (four) hours as needed. 60 tablet 0   sucralfate (CARAFATE) 1 g tablet Take 0.5 tablets (0.5 g total) by mouth 3 (three) times daily before meals. 60 tablet 5   vitamin B-12 (CYANOCOBALAMIN) 1000 MCG tablet Take 1,000 mcg by mouth daily.     dicyclomine (BENTYL) 10 MG capsule Take 1 capsule (10 mg total) by mouth 3 (three) times daily as needed for up to 14 days for spasms. 20 capsule 0   nitroGLYCERIN (NITROSTAT) 0.4 MG SL tablet Place 1 tablet (0.4 mg total) under the tongue every 5 (five) minutes as needed for chest pain. (Patient not taking: No sig reported) 25 tablet 3   ondansetron (ZOFRAN) 8 MG tablet Take 1 tablet (8 mg total) by mouth 2 (two) times daily as needed for refractory nausea / vomiting. (Patient not taking: No sig reported) 60 tablet 1   prochlorperazine (COMPAZINE) 10 MG tablet Take 1 tablet (10 mg total) by mouth every 6 (six)  hours as needed (Nausea or vomiting). (Patient not taking: No sig reported) 60 tablet 1   No current facility-administered medications for this visit.    OBJECTIVE: Vitals:   11/17/20 0829  BP: 102/78  Pulse: (!) 116  Resp: 18  Temp: (!) 96.9 F (36.1 C)  SpO2: 100%     Body mass index is 20.66 kg/m.    ECOG FS:1 - Symptomatic but completely ambulatory  General: Well-developed, well-nourished, no acute distress. Eyes: Pink conjunctiva, anicteric sclera. HEENT: Normocephalic, moist mucous membranes. Lungs: No audible wheezing or coughing. Heart: Regular rate and rhythm. Abdomen: Soft, nontender, no obvious distention. Musculoskeletal: No edema, cyanosis, or clubbing. Neuro: Alert, answering all questions appropriately. Cranial nerves grossly intact. Skin: No rashes or petechiae  noted. Psych: Normal affect.    LAB RESULTS:  Lab Results  Component Value Date   NA 127 (L) 11/17/2020   K 4.0 11/17/2020   CL 93 (L) 11/17/2020   CO2 24 11/17/2020   GLUCOSE 190 (H) 11/17/2020   BUN 12 11/17/2020   CREATININE 0.96 11/17/2020   CALCIUM 9.1 11/17/2020   PROT 7.0 11/17/2020   ALBUMIN 3.1 (L) 11/17/2020   AST 25 11/17/2020   ALT 22 11/17/2020   ALKPHOS 106 11/17/2020   BILITOT 0.8 11/17/2020   GFRNONAA >60 11/17/2020   GFRAA >60 07/14/2019    Lab Results  Component Value Date   WBC 11.0 (H) 11/17/2020   NEUTROABS 9.2 (H) 11/17/2020   HGB 9.5 (L) 11/17/2020   HCT 27.8 (L) 11/17/2020   MCV 85.3 11/17/2020   PLT 367 11/17/2020     STUDIES: DG Chest Port 1 View  Result Date: 10/24/2020 CLINICAL DATA:  Left upper lobe lung biopsy. EXAM: PORTABLE CHEST 1 VIEW COMPARISON:  Sep 21, 2020. FINDINGS: Normal cardiac size. Stable appearance of left upper lobe mass. No pneumothorax or pleural effusion is noted. Several probable pulmonary nodules are noted. Bony thorax is unremarkable. IMPRESSION: No pneumothorax status post left upper lobe lung biopsy. Electronically Signed    By: Marijo Conception M.D.   On: 10/24/2020 15:47   DG C-Arm 1-60 Min-No Report  Result Date: 10/24/2020 Fluoroscopy was utilized by the requesting physician.  No radiographic interpretation.   IR IMAGING GUIDED PORT INSERTION  Result Date: 10/28/2020 INDICATION: 70 year old male referred for port catheter placement EXAM: IMAGE GUIDED PORT CATHETER PLACEMENT MEDICATIONS: None ANESTHESIA/SEDATION: Moderate (conscious) sedation was employed during this procedure. A total of Versed 1.0 mg and Fentanyl 50 mcg was administered intravenously. Moderate Sedation Time: 20 minutes. The patient's level of consciousness and vital signs were monitored continuously by radiology nursing throughout the procedure under my direct supervision. FLUOROSCOPY TIME:  Fluoroscopy Time: 0 minutes 6 seconds (0.57 mGy). COMPLICATIONS: None PROCEDURE: The procedure, risks, benefits, and alternatives were explained to the patient. Questions regarding the procedure were encouraged and answered. The patient understands and consents to the procedure. Ultrasound survey was performed with images stored and sent to PACs. The right neck and chest was prepped with chlorhexidine, and draped in the usual sterile fashion using maximum barrier technique (cap and mask, sterile gown, sterile gloves, large sterile sheet, hand hygiene and cutaneous antiseptic). Local anesthesia was attained by infiltration with 1% lidocaine without epinephrine. Ultrasound demonstrated patency of the right internal jugular vein, and this was documented with an image. Under real-time ultrasound guidance, this vein was accessed with a 21 gauge micropuncture needle and image documentation was performed. A small dermatotomy was made at the access site with an 11 scalpel. A 0.018" wire was advanced into the SVC and used to estimate the length of the internal catheter. The access needle exchanged for a 25F micropuncture vascular sheath. The 0.018" wire was then removed and a  0.035" wire advanced into the IVC. An appropriate location for the subcutaneous reservoir was selected below the clavicle and an incision was made through the skin and underlying soft tissues. The subcutaneous tissues were then dissected using a combination of blunt and sharp surgical technique and a pocket was formed. A single lumen power injectable portacatheter was then tunneled through the subcutaneous tissues from the pocket to the dermatotomy and the port reservoir placed within the subcutaneous pocket. The venous access site was then serially dilated and a peel away vascular  sheath placed over the wire. The wire was removed and the port catheter advanced into position under fluoroscopic guidance. The catheter tip is positioned in the cavoatrial junction. This was documented with a spot image. The portacatheter was then tested and found to flush and aspirate well. The port was flushed with saline followed by 100 units/mL heparinized saline. The pocket was then closed in two layers using first subdermal inverted interrupted absorbable sutures followed by a running subcuticular suture. The epidermis was then sealed with Dermabond. The dermatotomy at the venous access site was also seal with Dermabond. Patient tolerated the procedure well and remained hemodynamically stable throughout. No complications encountered and no significant blood loss encountered IMPRESSION: Status post right IJ port catheter placement. Signed, Dulcy Fanny. Dellia Nims, RPVI [facility] Electronically Signed   By: Corrie Mckusick D.O.   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  PLAN:    1. 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  PET scan results from October 04, 2020 reviewed independently with hypermetabolic bilateral pulmonary nodules without mediastinal or hilar  lymphadenopathy.  Although imaging suggest stage IV disease, will treat patient as a stage IIIa.  MRI of the brain on October 08, 2020 was negative for disease.  Continue daily XRT.  Proceed with cycle 3 of weekly carboplatinum and Taxol today.  Return to clinic in 1 week for further evaluation and consideration of cycle 4.  Plan to reimage at the conclusion of his XRT and if patient has resolution of bilateral pulmonary nodules will consider maintenance immunotherapy for 1 year.   2.  Shoulder pain: Improving.  XRT as above.  Radiation oncology as above.  Appreciate palliative care input. 3.  Weight loss: Referral to dietary has been placed. 4.  Hyponatremia: Chronic and unchanged.  Patient's sodium level is 127 today. 5.  Leukocytosis: Likely reactive, monitor. 5.  Anemia: Chronic and unchanged, monitor.  Patient's hemoglobin is 9.5 today.   Patient expressed understanding and was in agreement with this plan. He also understands that He can call clinic at any time with any questions, concerns, or complaints.   Cancer Staging Non-small cell cancer of left lung [facility]) Staging form: Lung, AJCC 8th Edition - Clinical stage from 10/28/2020: Stage IVA (cT4, cN0, pM1a) - Signed by Lloyd Huger, MD on 10/28/2020  Lloyd Huger, MD   11/21/2020 6:31 AM

## 2020-11-16 ENCOUNTER — Ambulatory Visit
Admission: RE | Admit: 2020-11-16 | Discharge: 2020-11-16 | Disposition: A | Discharge status: Home / Self Care | Payer: Medicare HMO | Source: Ambulatory Visit | Attending: Radiation Oncology | Admitting: Radiation Oncology

## 2020-11-16 DIAGNOSIS — Z510 Encounter for antineoplastic radiation therapy: Secondary | ICD-10-CM | POA: Diagnosis not present

## 2020-11-16 LAB — PTH-RELATED PEPTIDE: PTH-related peptide: <2 pmol/L

## 2020-11-17 ENCOUNTER — Encounter: Payer: Self-pay | Admitting: Oncology

## 2020-11-17 ENCOUNTER — Other Ambulatory Visit: Payer: Self-pay

## 2020-11-17 ENCOUNTER — Inpatient Hospital Stay: Payer: Medicare HMO

## 2020-11-17 ENCOUNTER — Ambulatory Visit
Admission: RE | Admit: 2020-11-17 | Discharge: 2020-11-17 | Disposition: A | Discharge status: Home / Self Care | Payer: Medicare HMO | Source: Ambulatory Visit | Attending: Radiation Oncology | Admitting: Radiation Oncology

## 2020-11-17 ENCOUNTER — Inpatient Hospital Stay (HOSPITAL_BASED_OUTPATIENT_CLINIC_OR_DEPARTMENT_OTHER): Payer: Medicare HMO | Admitting: Oncology

## 2020-11-17 VITALS — BP 102/78 | HR 116 | Temp 96.9°F | Resp 18 | Wt 128.0 lb

## 2020-11-17 VITALS — HR 99

## 2020-11-17 DIAGNOSIS — C3492 Malignant neoplasm of unspecified part of left bronchus or lung: Secondary | ICD-10-CM

## 2020-11-17 DIAGNOSIS — C3490 Malignant neoplasm of unspecified part of unspecified bronchus or lung: Secondary | ICD-10-CM

## 2020-11-17 DIAGNOSIS — Z5111 Encounter for antineoplastic chemotherapy: Secondary | ICD-10-CM | POA: Diagnosis not present

## 2020-11-17 DIAGNOSIS — Z510 Encounter for antineoplastic radiation therapy: Secondary | ICD-10-CM | POA: Diagnosis not present

## 2020-11-17 LAB — COMPREHENSIVE METABOLIC PANEL
ALT: 22 U/L (ref 0–44)
AST: 25 U/L (ref 15–41)
Albumin: 3.1 g/dL — ABNORMAL LOW (ref 3.5–5.0)
Alkaline Phosphatase: 106 U/L (ref 38–126)
Anion gap: 10 (ref 5–15)
BUN: 12 mg/dL (ref 8–23)
CO2: 24 mmol/L (ref 22–32)
Calcium: 9.1 mg/dL (ref 8.9–10.3)
Chloride: 93 mmol/L — ABNORMAL LOW (ref 98–111)
Creatinine, Ser: 0.96 mg/dL (ref 0.61–1.24)
GFR, Estimated: >60 mL/min (ref >60)
Glucose, Bld: 190 mg/dL — ABNORMAL HIGH (ref 70–99)
Potassium: 4 mmol/L (ref 3.5–5.1)
Sodium: 127 mmol/L — ABNORMAL LOW (ref 135–145)
Total Bilirubin: 0.8 mg/dL (ref 0.3–1.2)
Total Protein: 7 g/dL (ref 6.5–8.1)

## 2020-11-17 LAB — CBC WITH DIFFERENTIAL/PLATELET
Abs Immature Granulocytes: 0.14 10*3/uL — ABNORMAL HIGH (ref 0.00–0.07)
Basophils Absolute: 0.1 10*3/uL (ref 0.0–0.1)
Basophils Relative: 1 %
Eosinophils Absolute: 0.1 10*3/uL (ref 0.0–0.5)
Eosinophils Relative: 1 %
HCT: 27.8 % — ABNORMAL LOW (ref 39.0–52.0)
Hemoglobin: 9.5 g/dL — ABNORMAL LOW (ref 13.0–17.0)
Immature Granulocytes: 1 %
Lymphocytes Relative: 8 %
Lymphs Abs: 0.9 10*3/uL (ref 0.7–4.0)
MCH: 29.1 pg (ref 26.0–34.0)
MCHC: 34.2 g/dL (ref 30.0–36.0)
MCV: 85.3 fL (ref 80.0–100.0)
Monocytes Absolute: 0.6 10*3/uL (ref 0.1–1.0)
Monocytes Relative: 6 %
Neutro Abs: 9.2 10*3/uL — ABNORMAL HIGH (ref 1.7–7.7)
Neutrophils Relative %: 83 %
Platelets: 367 10*3/uL (ref 150–400)
RBC: 3.26 MIL/uL — ABNORMAL LOW (ref 4.22–5.81)
RDW: 14.1 % (ref 11.5–15.5)
WBC: 11 10*3/uL — ABNORMAL HIGH (ref 4.0–10.5)
nRBC: 0 % (ref 0.0–0.2)

## 2020-11-17 LAB — TSH: TSH: 1.93 u[IU]/mL (ref 0.350–4.500)

## 2020-11-17 MED ORDER — HEPARIN SOD (PORK) LOCK FLUSH 100 UNIT/ML IV SOLN
INTRAVENOUS | Status: AC
  Filled 2020-11-17: qty 5

## 2020-11-17 MED ORDER — SODIUM CHLORIDE 0.9 % IV SOLN
160.0000 mg | Freq: Once | INTRAVENOUS | Status: AC
  Administered 2020-11-17: 160 mg via INTRAVENOUS
  Filled 2020-11-17: qty 16

## 2020-11-17 MED ORDER — HEPARIN SOD (PORK) LOCK FLUSH 100 UNIT/ML IV SOLN
500.0000 [IU] | Freq: Once | INTRAVENOUS | Status: DC
  Filled 2020-11-17: qty 5

## 2020-11-17 MED ORDER — SODIUM CHLORIDE 0.9 % IV SOLN
Freq: Once | INTRAVENOUS | Status: AC
  Filled 2020-11-17: qty 250

## 2020-11-17 MED ORDER — SODIUM CHLORIDE 0.9% FLUSH
10.0000 mL | INTRAVENOUS | Status: DC | PRN
  Administered 2020-11-17: 10 mL via INTRAVENOUS
  Filled 2020-11-17: qty 10

## 2020-11-17 MED ORDER — PALONOSETRON HCL INJECTION 0.25 MG/5ML
0.2500 mg | Freq: Once | INTRAVENOUS | Status: AC
  Administered 2020-11-17: 0.25 mg via INTRAVENOUS
  Filled 2020-11-17: qty 5

## 2020-11-17 MED ORDER — HEPARIN SOD (PORK) LOCK FLUSH 100 UNIT/ML IV SOLN
500.0000 [IU] | Freq: Once | INTRAVENOUS | Status: AC | PRN
  Administered 2020-11-17: 500 [IU]
  Filled 2020-11-17: qty 5

## 2020-11-17 MED ORDER — SODIUM CHLORIDE 0.9 % IV SOLN
10.0000 mg | Freq: Once | INTRAVENOUS | Status: AC
  Administered 2020-11-17: 10 mg via INTRAVENOUS
  Filled 2020-11-17: qty 10

## 2020-11-17 MED ORDER — FAMOTIDINE 20 MG IN NS 100 ML IVPB
20.0000 mg | Freq: Once | INTRAVENOUS | Status: AC
  Administered 2020-11-17: 20 mg via INTRAVENOUS
  Filled 2020-11-17: qty 20

## 2020-11-17 MED ORDER — SODIUM CHLORIDE 0.9 % IV SOLN
45.0000 mg/m2 | Freq: Once | INTRAVENOUS | Status: AC
  Administered 2020-11-17: 78 mg via INTRAVENOUS
  Filled 2020-11-17: qty 13

## 2020-11-17 MED ORDER — DIPHENHYDRAMINE HCL 50 MG/ML IJ SOLN
25.0000 mg | Freq: Once | INTRAMUSCULAR | Status: AC
  Administered 2020-11-17: 25 mg via INTRAVENOUS
  Filled 2020-11-17: qty 1

## 2020-11-17 NOTE — Patient Instructions (Signed)
[facility]  Discharge Instructions: Thank you for choosing [facility] to provide your oncology and hematology care.  If you have a lab appointment with the Cancer Center, please go directly to the Cancer Center and check in at the registration area.  Wear comfortable clothing and clothing appropriate for easy access to any Portacath or PICC line.   We strive to give you quality time with your provider. You may need to reschedule your appointment if you arrive late (15 or more minutes).  Arriving late affects you and other patients whose appointments are after yours.  Also, if you miss three or more appointments without notifying the office, you may be dismissed from the clinic at the provider's discretion.      For prescription refill requests, have your pharmacy contact our office and allow 72 hours for refills to be completed.    Today you received the following chemotherapy and/or immunotherapy agents - paclitaxel, carboplatin      To help prevent nausea and vomiting after your treatment, we encourage you to take your nausea medication as directed.  BELOW ARE SYMPTOMS THAT SHOULD BE REPORTED IMMEDIATELY: *FEVER GREATER THAN 100.4 F (38 C) OR HIGHER *CHILLS OR SWEATING *NAUSEA AND VOMITING THAT IS NOT CONTROLLED WITH YOUR NAUSEA MEDICATION *UNUSUAL SHORTNESS OF BREATH *UNUSUAL BRUISING OR BLEEDING *URINARY PROBLEMS (pain or burning when urinating, or frequent urination) *BOWEL PROBLEMS (unusual diarrhea, constipation, pain near the anus) TENDERNESS IN MOUTH AND THROAT WITH OR WITHOUT PRESENCE OF ULCERS (sore throat, sores in mouth, or a toothache) UNUSUAL RASH, SWELLING OR PAIN  UNUSUAL VAGINAL DISCHARGE OR ITCHING   Items with * indicate a potential emergency and should be followed up as soon as possible or go to the Emergency Department if any problems should occur.  Please show the CHEMOTHERAPY ALERT CARD or IMMUNOTHERAPY ALERT  CARD at check-in to the Emergency Department and triage nurse.  Should you have questions after your visit or need to cancel or reschedule your appointment, please contact [facility]  [phone] and follow the prompts.  Office hours are 8:00 a.m. to 4:30 p.m. Monday - Friday. Please note that voicemails left after 4:00 p.m. may not be returned until the following business day.  We are closed weekends and major holidays. You have access to a nurse at all times for urgent questions. Please call the main number to the clinic [phone] and follow the prompts.  For any non-urgent questions, you may also contact your provider using MyChart. We now offer e-Visits for anyone 18 and older to request care online for non-urgent symptoms. For details visit [email or web address].   Also download the MyChart app! Go to the app store, search "MyChart", open the app, select [facility], and log in with your MyChart username and password.  Due to Covid, a mask is required upon entering the hospital/clinic. If you do not have a mask, one will be given to you upon arrival. For doctor visits, patients may have 1 support person aged 18 or older with them. For treatment visits, patients cannot have anyone with them due to current Covid guidelines and our immunocompromised population.   Paclitaxel injection What is this medication? PACLITAXEL (PAK li TAX el) is a chemotherapy drug. It targets fast dividing cells, like cancer cells, and causes these cells to die. This medicine is used to treat ovarian cancer, breast cancer, lung cancer, Kaposi's sarcoma, andother cancers. This medicine may be used for other purposes;   ask your health care provider orpharmacist if you have questions. COMMON BRAND NAME(S): Onxol, Taxol What should I tell my care team before I take this medication? They need to know if you have any of these conditions: history of irregular heartbeat liver  disease low blood counts, like low white cell, platelet, or red cell counts lung or breathing disease, like asthma tingling of the fingers or toes, or other nerve disorder an unusual or allergic reaction to paclitaxel, alcohol, polyoxyethylated castor oil, other chemotherapy, other medicines, foods, dyes, or preservatives pregnant or trying to get pregnant breast-feeding How should I use this medication? This drug is given as an infusion into a vein. It is administered in a hospitalor clinic by a specially trained health care professional. Talk to your pediatrician regarding the use of this medicine in children.Special care may be needed. Overdosage: If you think you have taken too much of this medicine contact apoison control center or emergency room at once. NOTE: This medicine is only for you. Do not share this medicine with others. What if I miss a dose? It is important not to miss your dose. Call your doctor or health careprofessional if you are unable to keep an appointment. What may interact with this medication? Do not take this medicine with any of the following medications: live virus vaccines This medicine may also interact with the following medications: antiviral medicines for hepatitis, HIV or AIDS certain antibiotics like erythromycin and clarithromycin certain medicines for fungal infections like ketoconazole and itraconazole certain medicines for seizures like carbamazepine, phenobarbital, phenytoin gemfibrozil nefazodone rifampin St. John's wort This list may not describe all possible interactions. Give your health care provider a list of all the medicines, herbs, non-prescription drugs, or dietary supplements you use. Also tell them if you smoke, drink alcohol, or use illegaldrugs. Some items may interact with your medicine. What should I watch for while using this medication? Your condition will be monitored carefully while you are receiving this medicine. You will  need important blood work done while you are taking thismedicine. This medicine can cause serious allergic reactions. To reduce your risk you will need to take other medicine(s) before treatment with this medicine. If you experience allergic reactions like skin rash, itching or hives, swelling of theface, lips, or tongue, tell your doctor or health care professional right away. In some cases, you may be given additional medicines to help with side effects.Follow all directions for their use. This drug may make you feel generally unwell. This is not uncommon, as chemotherapy can affect healthy cells as well as cancer cells. Report any side effects. Continue your course of treatment even though you feel ill unless yourdoctor tells you to stop. Call your doctor or health care professional for advice if you get a fever, chills or sore throat, or other symptoms of a cold or flu. Do not treat yourself. This drug decreases your body's ability to fight infections. Try toavoid being around people who are sick. This medicine may increase your risk to bruise or bleed. Call your doctor orhealth care professional if you notice any unusual bleeding. Be careful brushing and flossing your teeth or using a toothpick because you may get an infection or bleed more easily. If you have any dental work done,tell your dentist you are receiving this medicine. Avoid taking products that contain aspirin, acetaminophen, ibuprofen, naproxen, or ketoprofen unless instructed by your doctor. These medicines may hide afever. Do not become pregnant while taking this medicine. Women should inform their   doctor if they wish to become pregnant or think they might be pregnant. There is a potential for serious side effects to an unborn child. Talk to your health care professional or pharmacist for more information. Do not breast-feed aninfant while taking this medicine. Men are advised not to father a child while receiving this medicine. This  product may contain alcohol. Ask your pharmacist or healthcare provider if this medicine contains alcohol. Be sure to tell all healthcare providers you are taking this medicine. Certain medicines, like metronidazole and disulfiram, can cause an unpleasant reaction when taken with alcohol. The reaction includes flushing, headache, nausea, vomiting, sweating, and increased thirst. Thereaction can last from 30 minutes to several hours. What side effects may I notice from receiving this medication? Side effects that you should report to your doctor or health care professionalas soon as possible: allergic reactions like skin rash, itching or hives, swelling of the face, lips, or tongue breathing problems changes in vision fast, irregular heartbeat high or low blood pressure mouth sores pain, tingling, numbness in the hands or feet signs of decreased platelets or bleeding - bruising, pinpoint red spots on the skin, black, tarry stools, blood in the urine signs of decreased red blood cells - unusually weak or tired, feeling faint or lightheaded, falls signs of infection - fever or chills, cough, sore throat, pain or difficulty passing urine signs and symptoms of liver injury like dark yellow or brown urine; general ill feeling or flu-like symptoms; light-colored stools; loss of appetite; nausea; right upper belly pain; unusually weak or tired; yellowing of the eyes or skin swelling of the ankles, feet, hands unusually slow heartbeat Side effects that usually do not require medical attention (report to yourdoctor or health care professional if they continue or are bothersome): diarrhea hair loss loss of appetite muscle or joint pain nausea, vomiting pain, redness, or irritation at site where injected tiredness This list may not describe all possible side effects. Call your doctor for medical advice about side effects. You may report side effects to FDA at1-800-FDA-1088. Where should I keep my  medication? This drug is given in a hospital or clinic and will not be stored at home. NOTE: This sheet is a summary. It may not cover all possible information. If you have questions about this medicine, talk to your doctor, pharmacist, orhealth care provider.  2022 Elsevier/Gold Standard (2019-03-18 13:37:23)  Carboplatin injection What is this medication? CARBOPLATIN (KAR boe pla tin) is a chemotherapy drug. It targets fast dividing cells, like cancer cells, and causes these cells to die. This medicine is usedto treat ovarian cancer and many other cancers. This medicine may be used for other purposes; ask your health care provider orpharmacist if you have questions. COMMON BRAND NAME(S): Paraplatin What should I tell my care team before I take this medication? They need to know if you have any of these conditions: blood disorders hearing problems kidney disease recent or ongoing radiation therapy an unusual or allergic reaction to carboplatin, cisplatin, other chemotherapy, other medicines, foods, dyes, or preservatives pregnant or trying to get pregnant breast-feeding How should I use this medication? This drug is usually given as an infusion into a vein. It is administered in ahospital or clinic by a specially trained health care professional. Talk to your pediatrician regarding the use of this medicine in children.Special care may be needed. Overdosage: If you think you have taken too much of this medicine contact apoison control center or emergency room at once. NOTE: This medicine   is only for you. Do not share this medicine with others. What if I miss a dose? It is important not to miss a dose. Call your doctor or health careprofessional if you are unable to keep an appointment. What may interact with this medication? medicines for seizures medicines to increase blood counts like filgrastim, pegfilgrastim, sargramostim some antibiotics like amikacin, gentamicin, neomycin,  streptomycin, tobramycin vaccines Talk to your doctor or health care professional before taking any of thesemedicines: acetaminophen aspirin ibuprofen ketoprofen naproxen This list may not describe all possible interactions. Give your health care provider a list of all the medicines, herbs, non-prescription drugs, or dietary supplements you use. Also tell them if you smoke, drink alcohol, or use illegaldrugs. Some items may interact with your medicine. What should I watch for while using this medication? Your condition will be monitored carefully while you are receiving this medicine. You will need important blood work done while you are taking thismedicine. This drug may make you feel generally unwell. This is not uncommon, as chemotherapy can affect healthy cells as well as cancer cells. Report any side effects. Continue your course of treatment even though you feel ill unless yourdoctor tells you to stop. In some cases, you may be given additional medicines to help with side effects.Follow all directions for their use. Call your doctor or health care professional for advice if you get a fever, chills or sore throat, or other symptoms of a cold or flu. Do not treat yourself. This drug decreases your body's ability to fight infections. Try toavoid being around people who are sick. This medicine may increase your risk to bruise or bleed. Call your doctor orhealth care professional if you notice any unusual bleeding. Be careful brushing and flossing your teeth or using a toothpick because you may get an infection or bleed more easily. If you have any dental work done,tell your dentist you are receiving this medicine. Avoid taking products that contain aspirin, acetaminophen, ibuprofen, naproxen, or ketoprofen unless instructed by your doctor. These medicines may hide afever. Do not become pregnant while taking this medicine. Women should inform their doctor if they wish to become pregnant or think  they might be pregnant. There is a potential for serious side effects to an unborn child. Talk to your health care professional or pharmacist for more information. Do not breast-feed aninfant while taking this medicine. What side effects may I notice from receiving this medication? Side effects that you should report to your doctor or health care professionalas soon as possible: allergic reactions like skin rash, itching or hives, swelling of the face, lips, or tongue signs of infection - fever or chills, cough, sore throat, pain or difficulty passing urine signs of decreased platelets or bleeding - bruising, pinpoint red spots on the skin, black, tarry stools, nosebleeds signs of decreased red blood cells - unusually weak or tired, fainting spells, lightheadedness breathing problems changes in hearing changes in vision chest pain high blood pressure low blood counts - This drug may decrease the number of white blood cells, red blood cells and platelets. You may be at increased risk for infections and bleeding. nausea and vomiting pain, swelling, redness or irritation at the injection site pain, tingling, numbness in the hands or feet problems with balance, talking, walking trouble passing urine or change in the amount of urine Side effects that usually do not require medical attention (report to yourdoctor or health care professional if they continue or are bothersome): hair loss loss of appetite metallic   taste in the mouth or changes in taste This list may not describe all possible side effects. Call your doctor for medical advice about side effects. You may report side effects to FDA at1-800-FDA-1088. Where should I keep my medication? This drug is given in a hospital or clinic and will not be stored at home. NOTE: This sheet is a summary. It may not cover all possible information. If you have questions about this medicine, talk to your doctor, pharmacist, orhealth care provider.  2022  Elsevier/Gold Standard (2007-07-22 14:38:05)  

## 2020-11-17 NOTE — Progress Notes (Signed)
Patient here for oncology follow-up appointment, concerns of constipation and difficulty swallowing

## 2020-11-17 NOTE — Progress Notes (Addendum)
Nutrition Assessment:  Referral for weight loss, poor appetite   Patient with stage IV squamous cell lung cancer.  Past medical history of smoking, HLD, HTN, DM, CAD, hepatic stenosis.  Patient receiving carbo/taxol and radiation.  Followed by Palliative care.   Met with patient during infusion.  Patient hard of hearing.  Says that his appetite is better than it has been but still not good.  Says that he ate a taco yesterday some creamed potatoes, 4 popsciles and ensure.  Says that he has been taking the medicine to help his swallowing.  Reports constipation.  RD asked patient if RD could speak with wife and he agreed.  RD called wife and she states that patient is a very plain eater.  Does not like butter on baked potato but just salt.  Does not like many condiments (no mustard, mayo, ketchup, gravy).  Likes glucerna shakes better than ensure per wife.  He does not really like the ensure shakes.  Drinks mostly water, some whole milk and chocolate milk.  Wife says that he likes cereal for breakfast, nabs for lunch and then eats supper.      Medications: reviewed  Labs: reviewed  Anthropometrics:   Weight 128 lb today  7/1 133 lb 6/10 141 lb 5/31 146 lb  5/20 163 Wife reports UBW of 160s  21% weight loss in the last 2 months, significant  Estimated Energy Needs  Kcals: 1700-2000 Protein: 85-100g Fluid: 1.7 L  NUTRITION DIAGNOSIS: Inadequate oral intake related to cancer treatment side effects as evidenced by 21% weight loss in the last 2 months and poor appetite   INTERVENTION:  Patient only likes glucerna shakes and would encouraged 2-3 in between meals Discussed with wife ways to add calories and protein to foods (challenging and patient does not like many of the foods/condiments). Discussed making foods soft and moist for ease of swallowing with patient and wife.  Handout given patient of soft moist foods. Encouraged milk with meals and snack if able for added calories  and protein Touched base with Donald Pew, NP regarding clarification of senna and miralax recommendations for constipation. Clarified instructions with wife.  Per NP can add senna daily to miralax BID. If no bowel movement then increase senna to BID along with miralax. If no bowel movement 2 tabs of senna BID and miralax BID.      MONITORING, EVALUATION, GOAL: weight trends, intake   NEXT VISIT: Thursday, July 28 during infusion  Donald Lowery B. Donald Lowery, [place], [place] Registered Dietitian [phone] (mobile)

## 2020-11-18 ENCOUNTER — Ambulatory Visit
Admission: RE | Admit: 2020-11-18 | Discharge: 2020-11-18 | Disposition: A | Discharge status: Home / Self Care | Payer: Medicare HMO | Source: Ambulatory Visit | Attending: Radiation Oncology | Admitting: Radiation Oncology

## 2020-11-18 DIAGNOSIS — Z510 Encounter for antineoplastic radiation therapy: Secondary | ICD-10-CM | POA: Diagnosis not present

## 2020-11-21 ENCOUNTER — Encounter: Payer: Self-pay | Admitting: Nurse Practitioner

## 2020-11-21 ENCOUNTER — Ambulatory Visit
Admission: RE | Admit: 2020-11-21 | Discharge: 2020-11-21 | Disposition: A | Discharge status: Home / Self Care | Payer: Medicare HMO | Source: Ambulatory Visit | Attending: Radiation Oncology | Admitting: Radiation Oncology

## 2020-11-21 ENCOUNTER — Encounter: Payer: Self-pay | Admitting: Oncology

## 2020-11-21 DIAGNOSIS — Z510 Encounter for antineoplastic radiation therapy: Secondary | ICD-10-CM | POA: Diagnosis not present

## 2020-11-21 NOTE — Progress Notes (Signed)
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  INTERVAL HISTORY: Patient returns to clinic today for further evaluation and consideration of cycle 4 of weekly carboplatinum and Taxol.  This past week he had a significant increase in left shoulder pain, but after adjustment of his medications by palliative care yesterday his pain has significantly improved.  He is tolerating his treatments well without significant side effects. He has no neurologic complaints.  He denies any recent fevers or illnesses.  He has a good appetite and denies weight loss.  He denies any chest pain, shortness of breath, cough, or hemoptysis.  He denies any abdominal pain.  He has no nausea, vomiting, constipation, or diarrhea.  He has no urinary complaints.  Patient offers no further specific complaints today.  REVIEW OF SYSTEMS:   Review of Systems  Constitutional: Negative.  Negative for fever, malaise/fatigue and weight loss.  Respiratory: Negative.  Negative for cough, hemoptysis and shortness of breath.   Cardiovascular: Negative.  Negative for chest pain and leg swelling.  Gastrointestinal:  Negative for abdominal pain.  Genitourinary: Negative.  Negative for dysuria.  Musculoskeletal:  Positive for joint pain.  Skin: Negative.  Negative for rash.  Neurological: Negative.  Negative for dizziness, focal weakness, weakness and headaches.   As per HPI. Otherwise, a complete review of systems is negative.  PAST MEDICAL HISTORY: Past Medical History:  Diagnosis Date   Aortic atherosclerosis ([place])    Bochdalek hernia 09/21/2020   fatty   Coronary artery disease     a. 04/2018 NSTEMI/Cath: LM nl, LAD 50p, 80m/d diffuse-small caliber, LCX heavily Ca2+ sev prox/mid dzs, RCA 90 diff distal dzs into RPL and RPDA. EF 50-55%-->Med Rx.   Hepatic steatosis    History of 2019 novel coronavirus disease (COVID-19) 10/12/2020   History of echocardiogram    a. 04/2018 Echo: EF 55-60%, no rwma, mild MR, mildly dil LA. Nl RV fxn.   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 Quit 04/2018.    PAST SURGICAL HISTORY: Past Surgical History:  Procedure Laterality Date   BRAIN SURGERY     CARDIAC CATHETERIZATION     IR IMAGING GUIDED PORT INSERTION  10/28/2020   LEFT HEART CATH AND CORONARY ANGIOGRAPHY N/A 05/16/2018   Procedure: LEFT HEART CATH AND CORONARY ANGIOGRAPHY poss PCI;  Surgeon: [name], MD;  Location: [facility];  Service: Cardiovascular;  Laterality: N/A;   VIDEO BRONCHOSCOPY WITH ENDOBRONCHIAL NAVIGATION N/A 10/24/2020   Procedure: ROBOTIC ASSISTED VIDEO BRONCHOSCOPY WITH ENDOBRONCHIAL NAVIGATION;  Surgeon: Tyler Pita, MD;  Location: [facility];  Service: Pulmonary;  Laterality: N/A;    FAMILY HISTORY: Family History  Problem Relation Age of Onset   Heart Problems Mother    Heart attack Father    Diabetes Brother    Heart Problems Brother     ADVANCED DIRECTIVES (Y/N):  N  HEALTH MAINTENANCE:  Social History   Tobacco Use   Smoking status: Former    Packs/day: 1.00    Years: 45.00    Pack years: 45.00    Types: Cigarettes    Quit date: 05/15/2018    Years since quitting: 2.5   Smokeless tobacco: Never  Vaping Use   Vaping Use: Never used  Substance Use Topics   Alcohol use: Never   Drug use: Never     Colonoscopy:  PAP:  Bone density:  Lipid  panel:  No Known Allergies  Current Outpatient Medications  Medication Sig Dispense Refill   aspirin EC 81 MG EC tablet Take 1 tablet (81 mg total) by mouth daily. 30 tablet 0   atorvastatin (LIPITOR) 80 MG tablet Take 1 tablet (80 mg total) by mouth daily at 6 PM. 90 tablet 0   dicyclomine (BENTYL) 10 MG capsule Take 1 capsule (10 mg total) by mouth 3 (three) times daily as needed for up to 14 days for spasms. 20 capsule 0   ezetimibe (ZETIA) 10 MG tablet Take 1 tablet (10 mg total) by mouth daily. 90 tablet 0   gabapentin (NEURONTIN) 300 MG capsule Take 1 capsule (300 mg total) by mouth 3 (three) times daily. 90 capsule 0   icosapent Ethyl (VASCEPA) 1 g capsule Take 2 capsules (2 g total) by mouth 2 (two) times daily. 120 capsule 6   isosorbide mononitrate (IMDUR) 30 MG 24 hr tablet Take 1 tablet (30 mg total) by mouth daily. 90 tablet 3   lidocaine-prilocaine (EMLA) cream Apply to affected area once 30 g 3   losartan (COZAAR) 25 MG tablet Take 1 tablet (25 mg total) by mouth daily. 90 tablet 1   Menthol-Methyl Salicylate (ICY HOT) 94-76 % STCK Apply 1 application topically daily as needed (shoulder pain).     metFORMIN (GLUCOPHAGE) 500 MG tablet Take 500 mg by mouth every morning.     morphine (MS CONTIN) 15 MG 12 hr tablet Take 2 tablets (30 mg total) by mouth every 12 (twelve) hours. 30 tablet 0   naloxone (NARCAN) nasal spray 4 mg/0.1 mL SPRAY 1 SPRAY INTO ONE NOSTRIL AS DIRECTED FOR OPIOID OVERDOSE (TURN PERSON ON SIDE AFTER DOSE. IF NO RESPONSE IN 2-3 MINUTES OR PERSON RESPONDS BUT RELAPSES, REPEAT USING A NEW SPRAY DEVICE AND SPRAY INTO THE OTHER NOSTRIL. CALL 911 AFTER USE.) * EMERGENCY USE ONLY * 1 each 0   nitroGLYCERIN (NITROSTAT) 0.4 MG SL tablet Place 1 tablet (0.4 mg total) under the tongue every 5 (five) minutes as needed for chest pain. 25 tablet 3   ondansetron (ZOFRAN) 8 MG tablet Take 1 tablet (8 mg total) by mouth 2 (two) times daily as needed for refractory nausea /  vomiting. 60 tablet 1   Oxycodone HCl 10 MG TABS Take 1 tablet (10 mg total) by mouth every 4 (four) hours as needed. 60 tablet 0   prochlorperazine (COMPAZINE) 10 MG tablet Take 1 tablet (10 mg total) by mouth every 6 (six) hours as needed (Nausea or vomiting). 60 tablet 1   sucralfate (CARAFATE) 1 g tablet Take 0.5 tablets (0.5 g total) by mouth 3 (three) times daily before meals. 60 tablet 5   vitamin B-12 (CYANOCOBALAMIN) 1000 MCG tablet Take 1,000 mcg by mouth daily.     No current facility-administered medications for this visit.   Facility-Administered Medications Ordered in Other Visits  Medication Dose Route Frequency Provider Last Rate Last Admin   CARBOplatin (PARAPLATIN) 160 mg in sodium chloride 0.9 %  100 mL chemo infusion  160 mg Intravenous Once Lloyd Huger, MD       heparin lock flush 100 unit/mL  500 Units Intravenous Once Grayland Ormond, Kathlene November, MD       heparin lock flush 100 unit/mL  500 Units Intracatheter Once PRN Lloyd Huger, MD       PACLitaxel (TAXOL) 78 mg in sodium chloride 0.9 % 250 mL chemo infusion (</= 80mg /m2)  45 mg/m2 (Treatment Plan Recorded) Intravenous Once Lloyd Huger, MD 263 mL/hr at 11/24/20 1210 78 mg at 11/24/20 1210   sodium chloride flush (NS) 0.9 % injection 10 mL  10 mL Intravenous PRN Lloyd Huger, MD   10 mL at 11/24/20 0945    OBJECTIVE: Vitals:   11/24/20 1017  BP: 121/64  Pulse: 94  Resp: 20  Temp: 97.8 F (36.6 C)  SpO2: 100%     Body mass index is 20.56 kg/m.    ECOG FS:1 - Symptomatic but completely ambulatory  General: Well-developed, well-nourished, no acute distress. Eyes: Pink conjunctiva, anicteric sclera. HEENT: Normocephalic, moist mucous membranes. Lungs: No audible wheezing or coughing. Heart: Regular rate and rhythm. Abdomen: Soft, nontender, no obvious distention. Musculoskeletal: No edema, cyanosis, or clubbing. Neuro: Alert, answering all questions appropriately. Cranial nerves grossly  intact. Skin: No rashes or petechiae noted. Psych: Normal affect.   LAB RESULTS:  Lab Results  Component Value Date   NA 127 (L) 11/24/2020   K 4.1 11/24/2020   CL 91 (L) 11/24/2020   CO2 26 11/24/2020   GLUCOSE 172 (H) 11/24/2020   BUN 14 11/24/2020   CREATININE 0.86 11/24/2020   CALCIUM 9.0 11/24/2020   PROT 6.9 11/24/2020   ALBUMIN 2.9 (L) 11/24/2020   AST 20 11/24/2020   ALT 21 11/24/2020   ALKPHOS 104 11/24/2020   BILITOT 0.8 11/24/2020   GFRNONAA >60 11/24/2020   GFRAA >60 07/14/2019    Lab Results  Component Value Date   WBC 8.8 11/24/2020   NEUTROABS 6.9 11/24/2020   HGB 9.0 (L) 11/24/2020   HCT 26.8 (L) 11/24/2020   MCV 86.2 11/24/2020   PLT 330 11/24/2020     STUDIES: IR IMAGING GUIDED PORT INSERTION  Result Date: 10/28/2020 INDICATION: 70 year old male referred for port catheter placement EXAM: IMAGE GUIDED PORT CATHETER PLACEMENT MEDICATIONS: None ANESTHESIA/SEDATION: Moderate (conscious) sedation was employed during this procedure. A total of Versed 1.0 mg and Fentanyl 50 mcg was administered intravenously. Moderate Sedation Time: 20 minutes. The patient's level of consciousness and vital signs were monitored continuously by radiology nursing throughout the procedure under my direct supervision. FLUOROSCOPY TIME:  Fluoroscopy Time: 0 minutes 6 seconds (0.57 mGy). COMPLICATIONS: None PROCEDURE: The procedure, risks, benefits, and alternatives were explained to the patient. Questions regarding the procedure were encouraged and answered. The patient understands and consents to the procedure. Ultrasound survey was performed with images stored and sent to PACs. The right neck and chest was prepped with chlorhexidine, and draped in the usual sterile fashion using maximum barrier technique (cap and mask, sterile gown, sterile gloves, large sterile sheet, hand hygiene and cutaneous antiseptic). Local anesthesia was attained by infiltration with 1% lidocaine without  epinephrine. Ultrasound demonstrated patency of the right internal jugular vein, and this was documented with an image. Under real-time ultrasound guidance, this vein was accessed with a 21 gauge micropuncture needle and image documentation was performed. A small dermatotomy was made at the access site with an 11 scalpel. A 0.018" wire was advanced into the SVC  and used to estimate the length of the internal catheter. The access needle exchanged for a 32F micropuncture vascular sheath. The 0.018" wire was then removed and a 0.035" wire advanced into the IVC. An appropriate location for the subcutaneous reservoir was selected below the clavicle and an incision was made through the skin and underlying soft tissues. The subcutaneous tissues were then dissected using a combination of blunt and sharp surgical technique and a pocket was formed. A single lumen power injectable portacatheter was then tunneled through the subcutaneous tissues from the pocket to the dermatotomy and the port reservoir placed within the subcutaneous pocket. The venous access site was then serially dilated and a peel away vascular sheath placed over the wire. The wire was removed and the port catheter advanced into position under fluoroscopic guidance. The catheter tip is positioned in the cavoatrial junction. This was documented with a spot image. The portacatheter was then tested and found to flush and aspirate well. The port was flushed with saline followed by 100 units/mL heparinized saline. The pocket was then closed in two layers using first subdermal inverted interrupted absorbable sutures followed by a running subcuticular suture. The epidermis was then sealed with Dermabond. The dermatotomy at the venous access site was also seal with Dermabond. Patient tolerated the procedure well and remained hemodynamically stable throughout. No complications encountered and no significant blood loss encountered IMPRESSION: Status post right IJ port  catheter placement. Signed, Dulcy Fanny. Dellia Nims, RPVI [facility] Electronically Signed   By: Corrie Mckusick D.O.   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  PLAN:    1. 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  PET scan results from October 04, 2020 reviewed independently with hypermetabolic bilateral pulmonary nodules without mediastinal or hilar lymphadenopathy.  Although imaging suggests stage IV disease, will treat patient as a stage IIIa.  MRI of the brain on October 08, 2020 was negative for disease.  Continue daily XRT.  Proceed with cycle 4 of weekly carboplatinum and Taxol today.  Return to clinic in 1 week for further evaluation and consideration of cycle 5. Plan to reimage at the conclusion of his XRT and if patient has resolution of bilateral pulmonary nodules will consider maintenance immunotherapy for 1 year.   2.  Shoulder pain: Significantly improved on new regimen prescribed by palliative care yesterday.  Continue MS Contin, oxycodone, and gabapentin as prescribed.  Continue daily XRT. 3.  Weight loss: Weight has stabilized.  Appreciate dietary input. 4.  Hyponatremia: Chronic and unchanged.  Patient sodium was 127 today. 5.  Leukocytosis: Resolved. 6.  Anemia: Hemoglobin has trended down slightly to 9.0, monitor.  Patient expressed understanding and was in agreement with this plan. He also understands that He can call clinic at any time with any questions, concerns, or complaints.   Cancer Staging Non-small cell cancer of left lung [facility]) Staging form: Lung, AJCC 8th Edition - Clinical stage from 10/28/2020: Stage IVA (cT4, cN0, pM1a) - Signed by Lloyd Huger, MD on 10/28/2020  Lloyd Huger, MD   11/24/2020 12:45 PM

## 2020-11-22 ENCOUNTER — Ambulatory Visit
Admission: RE | Admit: 2020-11-22 | Discharge: 2020-11-22 | Disposition: A | Discharge status: Home / Self Care | Payer: Medicare HMO | Source: Ambulatory Visit | Attending: Radiation Oncology | Admitting: Radiation Oncology

## 2020-11-22 ENCOUNTER — Inpatient Hospital Stay (HOSPITAL_BASED_OUTPATIENT_CLINIC_OR_DEPARTMENT_OTHER): Payer: Medicare HMO | Admitting: Hospice and Palliative Medicine

## 2020-11-22 DIAGNOSIS — C3492 Malignant neoplasm of unspecified part of left bronchus or lung: Secondary | ICD-10-CM

## 2020-11-22 DIAGNOSIS — Z510 Encounter for antineoplastic radiation therapy: Secondary | ICD-10-CM | POA: Diagnosis not present

## 2020-11-22 DIAGNOSIS — Z515 Encounter for palliative care: Secondary | ICD-10-CM | POA: Diagnosis not present

## 2020-11-22 DIAGNOSIS — G893 Neoplasm related pain (acute) (chronic): Secondary | ICD-10-CM

## 2020-11-22 DIAGNOSIS — Z5111 Encounter for antineoplastic chemotherapy: Secondary | ICD-10-CM | POA: Diagnosis not present

## 2020-11-22 MED ORDER — MORPHINE SULFATE ER 15 MG PO TBCR: 15.0000 mg | EXTENDED_RELEASE_TABLET | Freq: Two times a day (BID) | ORAL | 0 refills | Status: DC

## 2020-11-22 MED ORDER — NALOXONE HCL 4 MG/0.1ML NA LIQD: NASAL | 0 refills | Status: DC

## 2020-11-22 NOTE — Progress Notes (Signed)
[place]  [phone] [phone]   Name: Donald Lowery Date: 11/22/2020 MRN: [id number]  DOB: [birth year]  Patient Care Team: [name], NP as PCP - General (Internal Medicine) [name], MD as PCP - Cardiology (Cardiology) Telford Nab, RN as Oncology Nurse Navigator    REASON FOR CONSULTATION: [name] is a 70 y.o. 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  Patient has had significant left shoulder pain.  PET/CT demonstrated a 7.1 cm left apical mass, which abuts the mediastinum encasing the left subclavian and vertebral arteries and is felt probably to have nerve impingement leading to referred pain.  He also has vocal cord paralysis.  Patient was referred to palliative care to help address goals to manage ongoing symptoms.  SOCIAL HISTORY:     reports that he quit smoking about 2 years ago. His smoking use included cigarettes. He has a 45.00 pack-year smoking history. He has never used smokeless tobacco. He reports that he does not drink alcohol and does not use drugs.  Patient is married and lives at home with his wife.  He had a son who is now deceased.  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  ADVANCE DIRECTIVES:  Not on file  CODE STATUS:   PAST MEDICAL HISTORY: Past Medical History:  Diagnosis Date   Aortic atherosclerosis ([place])    [place] hernia 09/21/2020   fatty   Coronary artery disease    a. 04/2018 NSTEMI/Cath: LM nl, LAD 50p, 44md diffuse-small caliber, LCX heavily Ca2+ sev prox/mid dzs, RCA 90 diff distal dzs into RPL and RPDA. EF 50-55%-->Med Rx.   Hepatic steatosis    History of 2019 novel coronavirus disease (COVID-19) 10/12/2020   History of echocardiogram    a. 04/2018 Echo: EF 55-60%, no rwma, mild MR, mildly dil LA. Nl RV fxn.   Hyperlipidemia     Hypertension    NSTEMI (non-ST elevated myocardial infarction) ([facility] 05/15/2018   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[facility] 09/21/2020   a.) 7 cm LUL mass with left subclavian/proximal vertebral encasement   Paraseptal emphysema ([facility])    T2DM (type 2 diabetes mellitus) ([facility]    Tobacco abuse    a. Quit 04/2018.    PAST SURGICAL HISTORY:  Past Surgical History:  Procedure Laterality Date   BRAIN SURGERY     CARDIAC CATHETERIZATION     IR IMAGING GUIDED PORT INSERTION  10/28/2020   LEFT HEART CATH AND CORONARY ANGIOGRAPHY N/A 05/16/2018   Procedure: LEFT HEART CATH AND CORONARY ANGIOGRAPHY poss PCI;  Surgeon: GMinna Merritts MD;  [facility];  Service: Cardiovascular;  Laterality: N/A;   VIDEO BRONCHOSCOPY WITH ENDOBRONCHIAL NAVIGATION N/A 10/24/2020   Procedure: ROBOTIC ASSISTED VIDEO BRONCHOSCOPY WITH ENDOBRONCHIAL NAVIGATION;  Surgeon: GTyler Pita MD;  Location: [facility];  Service: Pulmonary;  Laterality: N/A;    HEMATOLOGY/ONCOLOGY HISTORY:  Oncology History  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  09/21/2020 Initial Diagnosis   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[facility]   10/28/2020 Cancer Staging   Staging form: Lung, AJCC 8th Edition - Clinical stage from 10/28/2020: Stage IVA (cT4, cN0, pM1a) - Signed by FLloyd Huger MD on 10/28/2020   11/03/2020 -  Chemotherapy    Patient is on Treatment Plan: LUNG CARBOPLATIN / PACLITAXEL + XRT Q7D        ALLERGIES:  has No Known Allergies.  MEDICATIONS:  Current Outpatient Medications  Medication Sig Dispense Refill   morphine (MS CONTIN) 15 MG 12 hr tablet Take 1 tablet (15 mg total) by mouth every 12 (twelve) hours. 30 tablet 0   naloxone (NARCAN) nasal spray 4 mg/0.1 mL SPRAY 1 SPRAY INTO ONE NOSTRIL AS DIRECTED FOR OPIOID OVERDOSE (TURN PERSON ON SIDE AFTER DOSE. IF NO RESPONSE IN 2-3 MINUTES OR PERSON RESPONDS BUT RELAPSES, REPEAT USING A NEW SPRAY DEVICE AND SPRAY INTO THE OTHER NOSTRIL. CALL 911 AFTER USE.)  * EMERGENCY USE ONLY * 1 each 0   aspirin EC 81 MG EC tablet Take 1 tablet (81 mg total) by mouth daily. 30 tablet 0   atorvastatin (LIPITOR) 80 MG tablet Take 1 tablet (80 mg total) by mouth daily at 6 PM. 90 tablet 0   dicyclomine (BENTYL) 10 MG capsule Take 1 capsule (10 mg total) by mouth 3 (three) times daily as needed for up to 14 days for spasms. 20 capsule 0   ezetimibe (ZETIA) 10 MG tablet Take 1 tablet (10 mg total) by mouth daily. 90 tablet 0   gabapentin (NEURONTIN) 300 MG capsule Take 1 capsule (300 mg total) by mouth 3 (three) times daily. 90 capsule 0   icosapent Ethyl (VASCEPA) 1 g capsule Take 2 capsules (2 g total) by mouth 2 (two) times daily. 120 capsule 6   isosorbide mononitrate (IMDUR) 30 MG 24 hr tablet Take 1 tablet (30 mg total) by mouth daily. 90 tablet 3   lidocaine-prilocaine (EMLA) cream Apply to affected area once 30 g 3   losartan (COZAAR) 25 MG tablet Take 1 tablet (25 mg total) by mouth daily. 90 tablet 1   Menthol-Methyl Salicylate (ICY HOT) 19-50 % STCK Apply 1 application topically daily as needed (shoulder pain).     metFORMIN (GLUCOPHAGE) 500 MG tablet Take 500 mg by mouth every morning.     nitroGLYCERIN (NITROSTAT) 0.4 MG SL tablet Place 1 tablet (0.4 mg total) under the tongue every 5 (five) minutes as needed for chest pain. (Patient not taking: No sig reported) 25 tablet 3   ondansetron (ZOFRAN) 8 MG tablet Take 1 tablet (8 mg total) by mouth 2 (two) times daily as needed for refractory nausea / vomiting. (Patient not taking: No sig reported) 60 tablet 1   Oxycodone HCl 10 MG TABS Take 1 tablet (10 mg total) by mouth every 4 (four) hours as needed. 60 tablet 0   prochlorperazine (COMPAZINE) 10 MG tablet Take 1 tablet (10 mg total) by mouth every 6 (six) hours as needed (Nausea or vomiting). (Patient not taking: No sig reported) 60 tablet 1   sucralfate (CARAFATE) 1 g tablet Take 0.5 tablets (0.5 g total) by mouth 3 (three) times daily before meals. 60 tablet  5   vitamin B-12 (CYANOCOBALAMIN) 1000 MCG tablet Take 1,000 mcg by mouth daily.     No current facility-administered medications for this visit.    VITAL SIGNS: There were no vitals taken for this visit. There were no vitals filed for this visit.  Estimated body mass index is 20.66 kg/m as calculated from the following:   Height as of 10/28/20: _0  (1.676 m).   Weight as of 11/17/20: 128 lb (58.1 kg).  LABS: CBC:    Component Value Date/Time   WBC 11.0 (H) 11/17/2020 0801   HGB 9.5 (L) 11/17/2020 0801   HCT 27.8 (L) 11/17/2020 0801   PLT 367 11/17/2020 0801   MCV 85.3  11/17/2020 0801   NEUTROABS 9.2 (H) 11/17/2020 0801   LYMPHSABS 0.9 11/17/2020 0801   MONOABS 0.6 11/17/2020 0801   EOSABS 0.1 11/17/2020 0801   BASOSABS 0.1 11/17/2020 0801   Comprehensive Metabolic Panel:    Component Value Date/Time   NA 127 (L) 11/17/2020 0801   NA 138 12/16/2018 0933   K 4.0 11/17/2020 0801   CL 93 (L) 11/17/2020 0801   CO2 24 11/17/2020 0801   BUN 12 11/17/2020 0801   BUN 14 12/16/2018 0933   CREATININE 0.96 11/17/2020 0801   GLUCOSE 190 (H) 11/17/2020 0801   CALCIUM 9.1 11/17/2020 0801   AST 25 11/17/2020 0801   ALT 22 11/17/2020 0801   ALKPHOS 106 11/17/2020 0801   BILITOT 0.8 11/17/2020 0801   PROT 7.0 11/17/2020 0801   ALBUMIN 3.1 (L) 11/17/2020 0801    RADIOGRAPHIC STUDIES: DG Chest Port 1 View  Result Date: 10/24/2020 CLINICAL DATA:  Left upper lobe lung biopsy. EXAM: PORTABLE CHEST 1 VIEW COMPARISON:  Sep 21, 2020. FINDINGS: Normal cardiac size. Stable appearance of left upper lobe mass. No pneumothorax or pleural effusion is noted. Several probable pulmonary nodules are noted. Bony thorax is unremarkable. IMPRESSION: No pneumothorax status post left upper lobe lung biopsy. Electronically Signed   By: Marijo Conception M.D.   On: 10/24/2020 15:47   DG C-Arm 1-60 Min-No Report  Result Date: 10/24/2020 Fluoroscopy was utilized by the requesting physician.  No  radiographic interpretation.   IR IMAGING GUIDED PORT INSERTION  Result Date: 10/28/2020 INDICATION: 70 year old male referred for port catheter placement EXAM: IMAGE GUIDED PORT CATHETER PLACEMENT MEDICATIONS: None ANESTHESIA/SEDATION: Moderate (conscious) sedation was employed during this procedure. A total of Versed 1.0 mg and Fentanyl 50 mcg was administered intravenously. Moderate Sedation Time: 20 minutes. The patient's level of consciousness and vital signs were monitored continuously by radiology nursing throughout the procedure under my direct supervision. FLUOROSCOPY TIME:  Fluoroscopy Time: 0 minutes 6 seconds (0.57 mGy). COMPLICATIONS: None PROCEDURE: The procedure, risks, benefits, and alternatives were explained to the patient. Questions regarding the procedure were encouraged and answered. The patient understands and consents to the procedure. Ultrasound survey was performed with images stored and sent to PACs. The right neck and chest was prepped with chlorhexidine, and draped in the usual sterile fashion using maximum barrier technique (cap and mask, sterile gown, sterile gloves, large sterile sheet, hand hygiene and cutaneous antiseptic). Local anesthesia was attained by infiltration with 1% lidocaine without epinephrine. Ultrasound demonstrated patency of the right internal jugular vein, and this was documented with an image. Under real-time ultrasound guidance, this vein was accessed with a 21 gauge micropuncture needle and image documentation was performed. A small dermatotomy was made at the access site with an 11 scalpel. A 0.018" wire was advanced into the SVC and used to estimate the length of the internal catheter. The access needle exchanged for a 80F micropuncture vascular sheath. The 0.018" wire was then removed and a 0.035" wire advanced into the IVC. An appropriate location for the subcutaneous reservoir was selected below the clavicle and an incision was made through the skin and  underlying soft tissues. The subcutaneous tissues were then dissected using a combination of blunt and sharp surgical technique and a pocket was formed. A single lumen power injectable portacatheter was then tunneled through the subcutaneous tissues from the pocket to the dermatotomy and the port reservoir placed within the subcutaneous pocket. The venous access site was then serially dilated and a peel away  vascular sheath placed over the wire. The wire was removed and the port catheter advanced into position under fluoroscopic guidance. The catheter tip is positioned in the cavoatrial junction. This was documented with a spot image. The portacatheter was then tested and found to flush and aspirate well. The port was flushed with saline followed by 100 units/mL heparinized saline. The pocket was then closed in two layers using first subdermal inverted interrupted absorbable sutures followed by a running subcuticular suture. The epidermis was then sealed with Dermabond. The dermatotomy at the venous access site was also seal with Dermabond. Patient tolerated the procedure well and remained hemodynamically stable throughout. No complications encountered and no significant blood loss encountered IMPRESSION: Status post right IJ port catheter placement. Signed, Dulcy Fanny. Dellia Nims, RPVI [facility] Electronically Signed   By: Corrie Mckusick D.O.   On: 10/28/2020 11:30    PERFORMANCE STATUS (ECOG) : 1 - Symptomatic but completely ambulatory  Review of Systems Unless otherwise noted, a complete review of systems is negative.  Physical Exam General: NAD Pulmonary: Unlabored Extremities: no edema, no joint deformities Skin: no rashes Neurological: Weakness but otherwise nonfocal  IMPRESSION: Patient was added onto my clinic schedule today to evaluate pain.  He was seen following his visit with Dr. Baruch Gouty.  Unfortunately, patient reports that he is  not receiving significant improvement with XRT.  He continues to endorse persistent and severe left shoulder/arm pain.  Patient says that he is taking oxycodone 1 to 2 tablets every 4 hours around-the-clock and does not feel like it is helping.  He is also on gabapentin but does not find that effective.  Patient requests "something stronger."  Note that I saw him two weeks ago and at the time of that visit pain seem to be more stable.  Discussed options with patient including liberalizing short acting opioid versus starting him on a long-acting opioid.  Patient verbalized preference for a longer acting pain medication.  We will start MS Contin 15 mg every 12 hours with conservative dosing.  May have to liberalize dosing and frequency in the coming days/weeks.  We will also refer him to interventional pain management for consideration of nerve block.  Discussed bowel regimen.  Patient is taking daily MiraLAX.  Suggested that he obtain senna to take if needed.  PLAN: -Continue current scope of treatment -Start MS Contin 15 mg every 12, #30 -Naloxone kit -Continue gabapentin/oxycodone -Continue daily bowel regimen -Referral for interventional pain management -Will benefit from ACP conversation -RTC later this week  Case and plan discussed with Dr. Grayland Ormond   Patient expressed understanding and was in agreement with this plan. He also understands that He can call the clinic at any time with any questions, concerns, or complaints.     Time Total: 15 minutes  Visit consisted of counseling and education dealing with the complex and emotionally intense issues of symptom management and palliative care in the setting of serious and potentially life-threatening illness.Greater than 50%  of this time was spent counseling and coordinating care related to the above assessment and plan.  Signed by: Altha Harm, PhD, NP-C

## 2020-11-23 ENCOUNTER — Inpatient Hospital Stay (HOSPITAL_BASED_OUTPATIENT_CLINIC_OR_DEPARTMENT_OTHER): Payer: Medicare HMO | Admitting: Hospice and Palliative Medicine

## 2020-11-23 ENCOUNTER — Other Ambulatory Visit: Payer: Self-pay

## 2020-11-23 ENCOUNTER — Telehealth: Payer: Self-pay | Admitting: *Deleted

## 2020-11-23 ENCOUNTER — Ambulatory Visit
Admission: RE | Admit: 2020-11-23 | Discharge: 2020-11-23 | Disposition: A | Discharge status: Home / Self Care | Payer: Medicare HMO | Source: Ambulatory Visit | Attending: Radiation Oncology | Admitting: Radiation Oncology

## 2020-11-23 DIAGNOSIS — Z515 Encounter for palliative care: Secondary | ICD-10-CM | POA: Diagnosis not present

## 2020-11-23 DIAGNOSIS — G893 Neoplasm related pain (acute) (chronic): Secondary | ICD-10-CM | POA: Diagnosis not present

## 2020-11-23 DIAGNOSIS — C3492 Malignant neoplasm of unspecified part of left bronchus or lung: Secondary | ICD-10-CM

## 2020-11-23 DIAGNOSIS — Z510 Encounter for antineoplastic radiation therapy: Secondary | ICD-10-CM | POA: Diagnosis not present

## 2020-11-23 MED ORDER — MORPHINE SULFATE ER 15 MG PO TBCR: 30.0000 mg | EXTENDED_RELEASE_TABLET | Freq: Two times a day (BID) | ORAL | 0 refills | Status: DC

## 2020-11-23 NOTE — Progress Notes (Signed)
[place]  [phone] [phone]   Name: Donald Lowery Date: 11/23/2020 MRN: [id number]  DOB: 1950/05/22  Patient Care Team: [name], NP as PCP - General (Internal Medicine) [name], MD as PCP - Cardiology (Cardiology) Telford Nab, RN as Oncology Nurse Navigator    REASON FOR CONSULTATION: Donald Lowery is a 70 y.o. 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  Patient has had significant left shoulder pain.  PET/CT demonstrated a 7.1 cm left apical mass, which abuts the mediastinum encasing the left subclavian and vertebral arteries and is felt probably to have nerve impingement leading to referred pain.  He also has vocal cord paralysis.  Patient was referred to palliative care to help address goals to manage ongoing symptoms.  SOCIAL HISTORY:     reports that he quit smoking about 2 years ago. His smoking use included cigarettes. He has a 45.00 pack-year smoking history. He has never used smokeless tobacco. He reports that he does not drink alcohol and does not use drugs.  Patient is married and lives at home with his wife.  He had a son who is now deceased.  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  ADVANCE DIRECTIVES:  Not on file  CODE STATUS:   PAST MEDICAL HISTORY: Past Medical History:  Diagnosis Date   Aortic atherosclerosis ([place])    [place] hernia 09/21/2020   fatty   Coronary artery disease    a. 04/2018 NSTEMI/Cath: LM nl, LAD 50p, 44m/d diffuse-small caliber, LCX heavily Ca2+ sev prox/mid dzs, RCA 90 diff distal dzs into RPL and RPDA. EF 50-55%-->Med Rx.   Hepatic steatosis    History of 2019 novel coronavirus disease (COVID-19) 10/12/2020   History of echocardiogram    a. 04/2018 Echo: EF 55-60%, no rwma, mild MR, mildly dil LA. Nl RV fxn.   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 Quit 04/2018.    PAST SURGICAL HISTORY:  Past Surgical History:  Procedure Laterality Date   BRAIN SURGERY     CARDIAC CATHETERIZATION     IR IMAGING GUIDED PORT INSERTION  10/28/2020   LEFT HEART CATH AND CORONARY ANGIOGRAPHY N/A 05/16/2018   Procedure: LEFT HEART CATH AND CORONARY ANGIOGRAPHY poss PCI;  Surgeon: [name], MD;  Location: [facility];  Service: Cardiovascular;  Laterality: N/A;   VIDEO BRONCHOSCOPY WITH ENDOBRONCHIAL NAVIGATION N/A 10/24/2020   Procedure: ROBOTIC ASSISTED VIDEO BRONCHOSCOPY WITH ENDOBRONCHIAL NAVIGATION;  Surgeon: Tyler Pita, MD;  Location: [facility];  Service: Pulmonary;  Laterality: N/A;    HEMATOLOGY/ONCOLOGY HISTORY:  Oncology History  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[place])  09/21/2020 Initial Diagnosis   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[place])   10/28/2020 Cancer Staging   Staging form: Lung, AJCC 8th Edition - Clinical stage from 10/28/2020: Stage IVA (cT4, cN0, pM1a) - Signed by Lloyd [place], MD on 10/28/2020   11/03/2020 -  Chemotherapy    Patient is on Treatment Plan: LUNG CARBOPLATIN / PACLITAXEL + XRT Q7D        ALLERGIES:  has No Known Allergies.  MEDICATIONS:  Current Outpatient Medications  Medication Sig Dispense Refill   aspirin EC 81 MG EC tablet Take 1 tablet (81 mg total) by mouth daily. 30 tablet 0   atorvastatin (LIPITOR) 80 MG tablet Take 1 tablet (80 mg total) by mouth daily at 6 PM. 90 tablet 0   dicyclomine (BENTYL) 10 MG capsule Take 1 capsule (10 mg total) by mouth 3 (three) times daily as needed for up to 14 days for spasms. 20 capsule 0   ezetimibe (ZETIA) 10 MG tablet Take 1 tablet (10 mg total) by  mouth daily. 90 tablet 0   gabapentin (NEURONTIN) 300 MG capsule Take 1 capsule (300 mg total) by mouth 3 (three) times daily. 90 capsule 0   icosapent Ethyl (VASCEPA) 1 g capsule Take 2 capsules (2 g total) by mouth 2 (two) times daily. 120 capsule 6   isosorbide mononitrate (IMDUR) 30 MG 24 hr tablet Take 1 tablet (30 mg total) by mouth daily. 90 tablet 3   lidocaine-prilocaine (EMLA) cream Apply to affected area once 30 g 3   losartan (COZAAR) 25 MG tablet Take 1 tablet (25 mg total) by mouth daily. 90 tablet 1   Menthol-Methyl Salicylate (ICY HOT) 69-48 % STCK Apply 1 application topically daily as needed (shoulder pain).     metFORMIN (GLUCOPHAGE) 500 MG tablet Take 500 mg by mouth every morning.     morphine (MS CONTIN) 15 MG 12 hr tablet Take 1 tablet (15 mg total) by mouth every 12 (twelve) hours. 30 tablet 0   naloxone (NARCAN) nasal spray 4 mg/0.1 mL SPRAY 1 SPRAY INTO ONE NOSTRIL AS DIRECTED FOR OPIOID OVERDOSE (TURN PERSON ON SIDE AFTER DOSE. IF NO RESPONSE IN 2-3 MINUTES OR PERSON RESPONDS BUT RELAPSES, REPEAT USING A NEW SPRAY DEVICE AND SPRAY INTO THE OTHER NOSTRIL. CALL 911 AFTER USE.) * EMERGENCY USE ONLY * 1 each 0   nitroGLYCERIN (NITROSTAT) 0.4 MG SL tablet Place 1 tablet (0.4 mg total) under the tongue every 5 (five) minutes as needed for chest pain. (Patient not taking: No sig reported) 25 tablet 3   ondansetron (ZOFRAN) 8 MG tablet Take 1 tablet (8 mg total) by mouth 2 (two) times daily as needed for refractory nausea / vomiting. (Patient not taking: No sig reported) 60 tablet 1   Oxycodone HCl 10 MG TABS Take 1 tablet (10 mg total) by mouth every 4 (four) hours as needed. 60 tablet 0   prochlorperazine (COMPAZINE) 10 MG tablet Take 1 tablet (10 mg total) by mouth every 6 (six) hours as needed (Nausea or vomiting). (Patient not taking: No sig reported) 60 tablet 1   sucralfate (CARAFATE) 1 g tablet Take 0.5 tablets (0.5 g total) by mouth 3 (three) times daily before meals. 60  tablet 5   vitamin B-12 (CYANOCOBALAMIN) 1000 MCG tablet Take 1,000 mcg by mouth daily.     No current facility-administered medications for this visit.    VITAL SIGNS: There were no vitals taken for this visit. There were no vitals filed for this visit.  Estimated body mass index is 20.66 kg/m as calculated from the following:   Height as of 10/28/20: 5\' 6"  (1.676 m).   Weight as of 11/17/20: 128 lb (58.1 kg).  LABS: CBC:    Component Value Date/Time   WBC 11.0 (H) 11/17/2020 0801   HGB 9.5 (L) 11/17/2020 0801   HCT 27.8 (L) 11/17/2020 0801   PLT 367 11/17/2020 0801   MCV 85.3  11/17/2020 0801   NEUTROABS 9.2 (H) 11/17/2020 0801   LYMPHSABS 0.9 11/17/2020 0801   MONOABS 0.6 11/17/2020 0801   EOSABS 0.1 11/17/2020 0801   BASOSABS 0.1 11/17/2020 0801   Comprehensive Metabolic Panel:    Component Value Date/Time   NA 127 (L) 11/17/2020 0801   NA 138 12/16/2018 0933   K 4.0 11/17/2020 0801   CL 93 (L) 11/17/2020 0801   CO2 24 11/17/2020 0801   BUN 12 11/17/2020 0801   BUN 14 12/16/2018 0933   CREATININE 0.96 11/17/2020 0801   GLUCOSE 190 (H) 11/17/2020 0801   CALCIUM 9.1 11/17/2020 0801   AST 25 11/17/2020 0801   ALT 22 11/17/2020 0801   ALKPHOS 106 11/17/2020 0801   BILITOT 0.8 11/17/2020 0801   PROT 7.0 11/17/2020 0801   ALBUMIN 3.1 (L) 11/17/2020 0801    RADIOGRAPHIC STUDIES: DG Chest Port 1 View  Result Date: 10/24/2020 CLINICAL DATA:  Left upper lobe lung biopsy. EXAM: PORTABLE CHEST 1 VIEW COMPARISON:  Sep 21, 2020. FINDINGS: Normal cardiac size. Stable appearance of left upper lobe mass. No pneumothorax or pleural effusion is noted. Several probable pulmonary nodules are noted. Bony thorax is unremarkable. IMPRESSION: No pneumothorax status post left upper lobe lung biopsy. Electronically Signed   By: Marijo Conception M.D.   On: 10/24/2020 15:47   DG C-Arm 1-60 Min-No Report  Result Date: 10/24/2020 Fluoroscopy was utilized by the requesting physician.  No  radiographic interpretation.   IR IMAGING GUIDED PORT INSERTION  Result Date: 10/28/2020 INDICATION: 70 year old male referred for port catheter placement EXAM: IMAGE GUIDED PORT CATHETER PLACEMENT MEDICATIONS: None ANESTHESIA/SEDATION: Moderate (conscious) sedation was employed during this procedure. A total of Versed 1.0 mg and Fentanyl 50 mcg was administered intravenously. Moderate Sedation Time: 20 minutes. The patient's level of consciousness and vital signs were monitored continuously by radiology nursing throughout the procedure under my direct supervision. FLUOROSCOPY TIME:  Fluoroscopy Time: 0 minutes 6 seconds (0.57 mGy). COMPLICATIONS: None PROCEDURE: The procedure, risks, benefits, and alternatives were explained to the patient. Questions regarding the procedure were encouraged and answered. The patient understands and consents to the procedure. Ultrasound survey was performed with images stored and sent to PACs. The right neck and chest was prepped with chlorhexidine, and draped in the usual sterile fashion using maximum barrier technique (cap and mask, sterile gown, sterile gloves, large sterile sheet, hand hygiene and cutaneous antiseptic). Local anesthesia was attained by infiltration with 1% lidocaine without epinephrine. Ultrasound demonstrated patency of the right internal jugular vein, and this was documented with an image. Under real-time ultrasound guidance, this vein was accessed with a 21 gauge micropuncture needle and image documentation was performed. A small dermatotomy was made at the access site with an 11 scalpel. A 0.018" wire was advanced into the SVC and used to estimate the length of the internal catheter. The access needle exchanged for a 79F micropuncture vascular sheath. The 0.018" wire was then removed and a 0.035" wire advanced into the IVC. An appropriate location for the subcutaneous reservoir was selected below the clavicle and an incision was made through the skin and  underlying soft tissues. The subcutaneous tissues were then dissected using a combination of blunt and sharp surgical technique and a pocket was formed. A single lumen power injectable portacatheter was then tunneled through the subcutaneous tissues from the pocket to the dermatotomy and the port reservoir placed within the subcutaneous pocket. The venous access site was then serially dilated and a peel away  vascular sheath placed over the wire. The wire was removed and the port catheter advanced into position under fluoroscopic guidance. The catheter tip is positioned in the cavoatrial junction. This was documented with a spot image. The portacatheter was then tested and found to flush and aspirate well. The port was flushed with saline followed by 100 units/mL heparinized saline. The pocket was then closed in two layers using first subdermal inverted interrupted absorbable sutures followed by a running subcuticular suture. The epidermis was then sealed with Dermabond. The dermatotomy at the venous access site was also seal with Dermabond. Patient tolerated the procedure well and remained hemodynamically stable throughout. No complications encountered and no significant blood loss encountered IMPRESSION: Status post right IJ port catheter placement. Signed, Dulcy Fanny. Dellia Nims, RPVI [facility] Radiology Electronically Signed   By: Corrie Mckusick D.O.   On: 10/28/2020 11:30    PERFORMANCE STATUS (ECOG) : 1 - Symptomatic but completely ambulatory  Review of Systems Unless otherwise noted, a complete review of systems is negative.  Physical Exam General: NAD Pulmonary: Unlabored Extremities: no edema, no joint deformities Skin: no rashes Neurological: Weakness but otherwise nonfocal  IMPRESSION: Patient requested to see me in radiation oncology to discuss pain management.  Yesterday, he started taking MS Contin 15 mg tablets and has taken 2 doses so far  and tolerated well without adverse effect.  However, he says that the pain is worse today and his arm/shoulder.  He does not seem to be improving on XRT.  I have referred him to pain management for consideration of a nerve block.  Again discussed the differences between long-acting and short acting pain medications.  We will increase MS Contin to 30 mg every 12 hours.  Continue oxycodone 1 to 2 tablets as needed for breakthrough pain.  Continue gabapentin.  We will see him tomorrow and consider adding a steroid if needed for possible inflammatory pain.  PLAN: -Continue current scope of treatment -Increase MS Contin 30 mg every 12 hours -Continue gabapentin/oxycodone -Continue daily bowel regimen -Pending referral for interventional pain management -Will benefit from ACP conversation -RTC tomorrow  Case and plan discussed with Dr. Grayland Ormond   Patient expressed understanding and was in agreement with this plan. He also understands that He can call the clinic at any time with any questions, concerns, or complaints.     Time Total: 15 minutes  Visit consisted of counseling and education dealing with the complex and emotionally intense issues of symptom management and palliative care in the setting of serious and potentially life-threatening illness.Greater than 50%  of this time was spent counseling and coordinating care related to the above assessment and plan.  Signed by: Altha Harm, PhD, NP-C

## 2020-11-23 NOTE — Telephone Encounter (Signed)
Patients wife called requesting for patient that he see Dr Grayland Ormond today due to his pain not being controlled. Please contact them for appointment with physician or [facility]

## 2020-11-24 ENCOUNTER — Inpatient Hospital Stay: Payer: Medicare HMO

## 2020-11-24 ENCOUNTER — Encounter: Payer: Self-pay | Admitting: Oncology

## 2020-11-24 ENCOUNTER — Inpatient Hospital Stay (HOSPITAL_BASED_OUTPATIENT_CLINIC_OR_DEPARTMENT_OTHER): Payer: Medicare HMO | Admitting: Oncology

## 2020-11-24 ENCOUNTER — Ambulatory Visit
Admission: RE | Admit: 2020-11-24 | Discharge: 2020-11-24 | Disposition: A | Discharge status: Home / Self Care | Payer: Medicare HMO | Source: Ambulatory Visit | Attending: Radiation Oncology | Admitting: Radiation Oncology

## 2020-11-24 ENCOUNTER — Inpatient Hospital Stay (HOSPITAL_BASED_OUTPATIENT_CLINIC_OR_DEPARTMENT_OTHER): Payer: Medicare HMO | Admitting: Hospice and Palliative Medicine

## 2020-11-24 VITALS — BP 121/64 | HR 94 | Temp 97.8°F | Resp 20 | Wt 127.4 lb

## 2020-11-24 DIAGNOSIS — Z79891 Long term (current) use of opiate analgesic: Secondary | ICD-10-CM | POA: Diagnosis not present

## 2020-11-24 DIAGNOSIS — C3492 Malignant neoplasm of unspecified part of left bronchus or lung: Secondary | ICD-10-CM | POA: Diagnosis not present

## 2020-11-24 DIAGNOSIS — Z515 Encounter for palliative care: Secondary | ICD-10-CM

## 2020-11-24 DIAGNOSIS — Z510 Encounter for antineoplastic radiation therapy: Secondary | ICD-10-CM | POA: Diagnosis not present

## 2020-11-24 DIAGNOSIS — Z5111 Encounter for antineoplastic chemotherapy: Secondary | ICD-10-CM | POA: Diagnosis not present

## 2020-11-24 DIAGNOSIS — G893 Neoplasm related pain (acute) (chronic): Secondary | ICD-10-CM

## 2020-11-24 LAB — CBC WITH DIFFERENTIAL/PLATELET
Abs Immature Granulocytes: 0.23 10*3/uL — ABNORMAL HIGH (ref 0.00–0.07)
Basophils Absolute: 0.1 10*3/uL (ref 0.0–0.1)
Basophils Relative: 1 %
Eosinophils Absolute: 0 10*3/uL (ref 0.0–0.5)
Eosinophils Relative: 1 %
HCT: 26.8 % — ABNORMAL LOW (ref 39.0–52.0)
Hemoglobin: 9 g/dL — ABNORMAL LOW (ref 13.0–17.0)
Immature Granulocytes: 3 %
Lymphocytes Relative: 11 %
Lymphs Abs: 0.9 10*3/uL (ref 0.7–4.0)
MCH: 28.9 pg (ref 26.0–34.0)
MCHC: 33.6 g/dL (ref 30.0–36.0)
MCV: 86.2 fL (ref 80.0–100.0)
Monocytes Absolute: 0.6 10*3/uL (ref 0.1–1.0)
Monocytes Relative: 7 %
Neutro Abs: 6.9 10*3/uL (ref 1.7–7.7)
Neutrophils Relative %: 77 %
Platelets: 330 10*3/uL (ref 150–400)
RBC: 3.11 MIL/uL — ABNORMAL LOW (ref 4.22–5.81)
RDW: 14.9 % (ref 11.5–15.5)
WBC: 8.8 10*3/uL (ref 4.0–10.5)
nRBC: 0 % (ref 0.0–0.2)

## 2020-11-24 LAB — COMPREHENSIVE METABOLIC PANEL
ALT: 21 U/L (ref 0–44)
AST: 20 U/L (ref 15–41)
Albumin: 2.9 g/dL — ABNORMAL LOW (ref 3.5–5.0)
Alkaline Phosphatase: 104 U/L (ref 38–126)
Anion gap: 10 (ref 5–15)
BUN: 14 mg/dL (ref 8–23)
CO2: 26 mmol/L (ref 22–32)
Calcium: 9 mg/dL (ref 8.9–10.3)
Chloride: 91 mmol/L — ABNORMAL LOW (ref 98–111)
Creatinine, Ser: 0.86 mg/dL (ref 0.61–1.24)
GFR, Estimated: >60 mL/min (ref >60)
Glucose, Bld: 172 mg/dL — ABNORMAL HIGH (ref 70–99)
Potassium: 4.1 mmol/L (ref 3.5–5.1)
Sodium: 127 mmol/L — ABNORMAL LOW (ref 135–145)
Total Bilirubin: 0.8 mg/dL (ref 0.3–1.2)
Total Protein: 6.9 g/dL (ref 6.5–8.1)

## 2020-11-24 MED ORDER — HEPARIN SOD (PORK) LOCK FLUSH 100 UNIT/ML IV SOLN
INTRAVENOUS | Status: AC
  Filled 2020-11-24: qty 5

## 2020-11-24 MED ORDER — SODIUM CHLORIDE 0.9 % IV SOLN
45.0000 mg/m2 | Freq: Once | INTRAVENOUS | Status: AC
  Administered 2020-11-24: 78 mg via INTRAVENOUS
  Filled 2020-11-24: qty 13

## 2020-11-24 MED ORDER — HEPARIN SOD (PORK) LOCK FLUSH 100 UNIT/ML IV SOLN
500.0000 [IU] | Freq: Once | INTRAVENOUS | Status: DC | PRN
  Filled 2020-11-24: qty 5

## 2020-11-24 MED ORDER — SODIUM CHLORIDE 0.9 % IV SOLN
Freq: Once | INTRAVENOUS | Status: AC
  Filled 2020-11-24: qty 250

## 2020-11-24 MED ORDER — SODIUM CHLORIDE 0.9% FLUSH
10.0000 mL | INTRAVENOUS | Status: DC | PRN
  Administered 2020-11-24: 10 mL via INTRAVENOUS
  Filled 2020-11-24: qty 10

## 2020-11-24 MED ORDER — PALONOSETRON HCL INJECTION 0.25 MG/5ML
0.2500 mg | Freq: Once | INTRAVENOUS | Status: AC
  Administered 2020-11-24: 0.25 mg via INTRAVENOUS
  Filled 2020-11-24: qty 5

## 2020-11-24 MED ORDER — HEPARIN SOD (PORK) LOCK FLUSH 100 UNIT/ML IV SOLN
500.0000 [IU] | Freq: Once | INTRAVENOUS | Status: AC
  Administered 2020-11-24: 500 [IU] via INTRAVENOUS
  Filled 2020-11-24: qty 5

## 2020-11-24 MED ORDER — DIPHENHYDRAMINE HCL 50 MG/ML IJ SOLN
25.0000 mg | Freq: Once | INTRAMUSCULAR | Status: AC
  Administered 2020-11-24: 25 mg via INTRAVENOUS
  Filled 2020-11-24: qty 1

## 2020-11-24 MED ORDER — SODIUM CHLORIDE 0.9 % IV SOLN
160.0000 mg | Freq: Once | INTRAVENOUS | Status: AC
  Administered 2020-11-24: 160 mg via INTRAVENOUS
  Filled 2020-11-24: qty 16

## 2020-11-24 MED ORDER — FAMOTIDINE 20 MG IN NS 100 ML IVPB
20.0000 mg | Freq: Once | INTRAVENOUS | Status: AC
  Administered 2020-11-24: 20 mg via INTRAVENOUS
  Filled 2020-11-24: qty 20

## 2020-11-24 MED ORDER — SODIUM CHLORIDE 0.9 % IV SOLN
10.0000 mg | Freq: Once | INTRAVENOUS | Status: AC
  Administered 2020-11-24: 10 mg via INTRAVENOUS
  Filled 2020-11-24: qty 10

## 2020-11-24 MED ORDER — OXYCODONE HCL 10 MG PO TABS: 10.0000 mg | ORAL_TABLET | ORAL | 0 refills | Status: DC | PRN

## 2020-11-24 NOTE — Progress Notes (Signed)
Nutrition Follow-up:  Patient with stage IV squamous cell lung cancer.  Patient receiving carbo/taxol and radiation.    Met with patient during infusion.  Patient reports that appetite is a little bit better.  Says that he is drinking about 2 shakes per day (per wife likes glucerna) Did not eat breakfast this am before coming to treatment.  Last night ate 2 small, thin pieces of pizza.    Medications: reviewed  Labs: reviewed  Anthropometrics:   Weight 127 lb today  128 lb on 7/21 133 lb on 7/1 141 lb on 6/10 146 lb on 5/31 163 lb on 5/20  UBW of 160s   NUTRITION DIAGNOSIS: Inadequate oral intake continues   INTERVENTION:  Continue glucerna shakes 2-3 times per day.  Encouraged patient to drink a shake if unable to eat solid food at a meal (ie breakfast)     MONITORING, EVALUATION, GOAL: weight trends, intake   NEXT VISIT: ~ 2-4 weeks   Donald Lowery, [place], [place] Registered Dietitian [phone] (mobile)

## 2020-11-24 NOTE — Progress Notes (Signed)
[facility]  [phone] [phone]   Name: Donald Lowery Date: 11/24/2020 MRN: [id number]  DOB: 1951/01/21  Patient Care Team: [name], NP as PCP - General (Internal Medicine) [name], MD as PCP - Cardiology (Cardiology) Telford Nab, RN as Oncology Nurse Navigator    REASON FOR CONSULTATION: Donald Lowery is a 70 y.o. 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  Patient has had significant left shoulder pain.  PET/CT demonstrated a 7.1 cm left apical mass, which abuts the mediastinum encasing the left subclavian and vertebral arteries and is felt probably to have nerve impingement leading to referred pain.  He also has vocal cord paralysis.  Patient was referred to palliative care to help address goals to manage ongoing symptoms.  SOCIAL HISTORY:     reports that he quit smoking about 2 years ago. His smoking use included cigarettes. He has a 45.00 pack-year smoking history. He has never used smokeless tobacco. He reports that he does not drink alcohol and does not use drugs.  Patient is married and lives at home with his wife.  He had a son who is now deceased.  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  ADVANCE DIRECTIVES:  Not on file  CODE STATUS:   PAST MEDICAL HISTORY: Past Medical History:  Diagnosis Date   Aortic atherosclerosis ([place])    Bochdalek hernia 09/21/2020   fatty   Coronary artery disease    a. 04/2018 NSTEMI/Cath: LM nl, LAD 50p, 95m/d diffuse-small caliber, LCX heavily Ca2+ sev prox/mid dzs, RCA 90 diff distal dzs into RPL and RPDA. EF 50-55%-->Med Rx.   Hepatic steatosis    History of 2019 novel coronavirus disease (COVID-19) 10/12/2020   History of echocardiogram    a. 04/2018 Echo: EF 55-60%, no rwma, mild MR, mildly dil LA. Nl RV fxn.   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 Quit 04/2018.    PAST SURGICAL HISTORY:  Past Surgical History:  Procedure Laterality Date   BRAIN SURGERY     CARDIAC CATHETERIZATION     IR IMAGING GUIDED PORT INSERTION  10/28/2020   LEFT HEART CATH AND CORONARY ANGIOGRAPHY N/A 05/16/2018   Procedure: LEFT HEART CATH AND CORONARY ANGIOGRAPHY poss PCI;  Surgeon: [name], MD;  Location: [facility];  Service: Cardiovascular;  Laterality: N/A;   VIDEO BRONCHOSCOPY WITH ENDOBRONCHIAL NAVIGATION N/A 10/24/2020   Procedure: ROBOTIC ASSISTED VIDEO BRONCHOSCOPY WITH ENDOBRONCHIAL NAVIGATION;  Surgeon: Tyler Pita, MD;  Location: [facility];  Service: Pulmonary;  Laterality: N/A;    HEMATOLOGY/ONCOLOGY HISTORY:  Oncology History  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[facility])  09/21/2020 Initial Diagnosis   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[place])   10/28/2020 Cancer Staging   Staging form: Lung, AJCC 8th Edition - Clinical stage from 10/28/2020: Stage IVA (cT4, cN0, pM1a) - Signed by Lloyd Huger, MD on 10/28/2020   11/03/2020 -  Chemotherapy    Patient is on Treatment Plan: LUNG CARBOPLATIN / PACLITAXEL + XRT Q7D        ALLERGIES:  has No Known Allergies.  MEDICATIONS:  Current Outpatient Medications  Medication Sig Dispense Refill   aspirin EC 81 MG EC tablet Take 1 tablet (81 mg total) by mouth daily. 30 tablet 0   atorvastatin (LIPITOR) 80 MG tablet Take 1 tablet (80 mg total) by mouth daily at 6 PM. 90 tablet 0   dicyclomine (BENTYL) 10 MG capsule Take 1 capsule (10 mg total) by mouth 3 (three) times daily as needed for up to 14 days for spasms. 20 capsule 0   ezetimibe (ZETIA) 10 MG tablet Take 1 tablet (10 mg total) by  mouth daily. 90 tablet 0   gabapentin (NEURONTIN) 300 MG capsule Take 1 capsule (300 mg total) by mouth 3 (three) times daily. 90 capsule 0   icosapent Ethyl (VASCEPA) 1 g capsule Take 2 capsules (2 g total) by mouth 2 (two) times daily. 120 capsule 6   isosorbide mononitrate (IMDUR) 30 MG 24 hr tablet Take 1 tablet (30 mg total) by mouth daily. 90 tablet 3   lidocaine-prilocaine (EMLA) cream Apply to affected area once 30 g 3   losartan (COZAAR) 25 MG tablet Take 1 tablet (25 mg total) by mouth daily. 90 tablet 1   Menthol-Methyl Salicylate (ICY HOT) 67-01 % STCK Apply 1 application topically daily as needed (shoulder pain).     metFORMIN (GLUCOPHAGE) 500 MG tablet Take 500 mg by mouth every morning.     morphine (MS CONTIN) 15 MG 12 hr tablet Take 2 tablets (30 mg total) by mouth every 12 (twelve) hours. 30 tablet 0   naloxone (NARCAN) nasal spray 4 mg/0.1 mL SPRAY 1 SPRAY INTO ONE NOSTRIL AS DIRECTED FOR OPIOID OVERDOSE (TURN PERSON ON SIDE AFTER DOSE. IF NO RESPONSE IN 2-3 MINUTES OR PERSON RESPONDS BUT RELAPSES, REPEAT USING A NEW SPRAY DEVICE AND SPRAY INTO THE OTHER NOSTRIL. CALL 911 AFTER USE.) * EMERGENCY USE ONLY * 1 each 0   nitroGLYCERIN (NITROSTAT) 0.4 MG SL tablet Place 1 tablet (0.4 mg total) under the tongue every 5 (five) minutes as needed for chest pain. 25 tablet 3   ondansetron (ZOFRAN) 8 MG tablet Take 1 tablet (8 mg total) by mouth 2 (two) times daily as needed for refractory nausea / vomiting. 60 tablet 1   Oxycodone HCl 10 MG TABS Take 1 tablet (10 mg total) by mouth every 4 (four) hours as needed. 60 tablet 0   prochlorperazine (COMPAZINE) 10 MG tablet Take 1 tablet (10 mg total) by mouth every 6 (six) hours as needed (Nausea or vomiting). 60 tablet 1   sucralfate (CARAFATE) 1 g tablet Take 0.5 tablets (0.5 g total) by mouth 3 (three) times daily before meals. 60 tablet 5   vitamin B-12 (CYANOCOBALAMIN) 1000 MCG tablet Take 1,000 mcg by mouth daily.     No current  facility-administered medications for this visit.   Facility-Administered Medications Ordered in Other Visits  Medication Dose Route Frequency Provider Last Rate Last Admin   CARBOplatin (PARAPLATIN) 160 mg in sodium chloride 0.9 % 100 mL chemo infusion  160 mg Intravenous Once Lloyd Huger, MD       heparin lock flush 100 unit/mL  500 Units Intravenous Once Lloyd Huger, MD       heparin lock flush 100 unit/mL  500 Units Intracatheter Once PRN Lloyd Huger, MD       PACLitaxel (TAXOL) 78 mg in sodium chloride 0.9 % 250 mL chemo infusion (</= 80mg /m2)  45 mg/m2 (Treatment Plan Recorded) Intravenous Once  Lloyd Huger, MD 263 mL/hr at 11/24/20 1210 78 mg at 11/24/20 1210   sodium chloride flush (NS) 0.9 % injection 10 mL  10 mL Intravenous PRN Lloyd Huger, MD   10 mL at 11/24/20 0945    VITAL SIGNS: There were no vitals taken for this visit. There were no vitals filed for this visit.  Estimated body mass index is 20.56 kg/m as calculated from the following:   Height as of 10/28/20: 5\' 6"  (1.676 m).   Weight as of an earlier encounter on 11/24/20: 127 lb 6.4 oz (57.8 kg).  LABS: CBC:    Component Value Date/Time   WBC 8.8 11/24/2020 0939   HGB 9.0 (L) 11/24/2020 0939   HCT 26.8 (L) 11/24/2020 0939   PLT 330 11/24/2020 0939   MCV 86.2 11/24/2020 0939   NEUTROABS 6.9 11/24/2020 0939   LYMPHSABS 0.9 11/24/2020 0939   MONOABS 0.6 11/24/2020 0939   EOSABS 0.0 11/24/2020 0939   BASOSABS 0.1 11/24/2020 0939   Comprehensive Metabolic Panel:    Component Value Date/Time   NA 127 (L) 11/24/2020 0939   NA 138 12/16/2018 0933   K 4.1 11/24/2020 0939   CL 91 (L) 11/24/2020 0939   CO2 26 11/24/2020 0939   BUN 14 11/24/2020 0939   BUN 14 12/16/2018 0933   CREATININE 0.86 11/24/2020 0939   GLUCOSE 172 (H) 11/24/2020 0939   CALCIUM 9.0 11/24/2020 0939   AST 20 11/24/2020 0939   ALT 21 11/24/2020 0939   ALKPHOS 104 11/24/2020 0939   BILITOT 0.8 11/24/2020  0939   PROT 6.9 11/24/2020 0939   ALBUMIN 2.9 (L) 11/24/2020 0939    RADIOGRAPHIC STUDIES: IR IMAGING GUIDED PORT INSERTION  Result Date: 10/28/2020 INDICATION: 70 year old male referred for port catheter placement EXAM: IMAGE GUIDED PORT CATHETER PLACEMENT MEDICATIONS: None ANESTHESIA/SEDATION: Moderate (conscious) sedation was employed during this procedure. A total of Versed 1.0 mg and Fentanyl 50 mcg was administered intravenously. Moderate Sedation Time: 20 minutes. The patient's level of consciousness and vital signs were monitored continuously by radiology nursing throughout the procedure under my direct supervision. FLUOROSCOPY TIME:  Fluoroscopy Time: 0 minutes 6 seconds (0.57 mGy). COMPLICATIONS: None PROCEDURE: The procedure, risks, benefits, and alternatives were explained to the patient. Questions regarding the procedure were encouraged and answered. The patient understands and consents to the procedure. Ultrasound survey was performed with images stored and sent to PACs. The right neck and chest was prepped with chlorhexidine, and draped in the usual sterile fashion using maximum barrier technique (cap and mask, sterile gown, sterile gloves, large sterile sheet, hand hygiene and cutaneous antiseptic). Local anesthesia was attained by infiltration with 1% lidocaine without epinephrine. Ultrasound demonstrated patency of the right internal jugular vein, and this was documented with an image. Under real-time ultrasound guidance, this vein was accessed with a 21 gauge micropuncture needle and image documentation was performed. A small dermatotomy was made at the access site with an 11 scalpel. A 0.018" wire was advanced into the SVC and used to estimate the length of the internal catheter. The access needle exchanged for a 57F micropuncture vascular sheath. The 0.018" wire was then removed and a 0.035" wire advanced into the IVC. An appropriate location for the subcutaneous reservoir was selected  below the clavicle and an incision was made through the skin and underlying soft tissues. The subcutaneous tissues were then dissected using a combination of blunt and sharp surgical technique and a pocket was formed. A single lumen power injectable  portacatheter was then tunneled through the subcutaneous tissues from the pocket to the dermatotomy and the port reservoir placed within the subcutaneous pocket. The venous access site was then serially dilated and a peel away vascular sheath placed over the wire. The wire was removed and the port catheter advanced into position under fluoroscopic guidance. The catheter tip is positioned in the cavoatrial junction. This was documented with a spot image. The portacatheter was then tested and found to flush and aspirate well. The port was flushed with saline followed by 100 units/mL heparinized saline. The pocket was then closed in two layers using first subdermal inverted interrupted absorbable sutures followed by a running subcuticular suture. The epidermis was then sealed with Dermabond. The dermatotomy at the venous access site was also seal with Dermabond. Patient tolerated the procedure well and remained hemodynamically stable throughout. No complications encountered and no significant blood loss encountered IMPRESSION: Status post right IJ port catheter placement. Signed, Dulcy Fanny. Dellia Nims, RPVI [facility] Electronically Signed   By: Corrie Mckusick D.O.   On: 10/28/2020 11:30    PERFORMANCE STATUS (ECOG) : 1 - Symptomatic but completely ambulatory  Review of Systems Unless otherwise noted, a complete review of systems is negative.  Physical Exam General: NAD Pulmonary: Unlabored Extremities: no edema, no joint deformities Skin: no rashes Neurological: Weakness but otherwise nonfocal  IMPRESSION: Follow-up visit.  Patient seen in infusion.  Patient reports that pain is significantly  improved today.  He says he is the best he has felt in a couple of months.  He currently rates pain as 6-7 out of 10.  He is in agreement with continuing current pain regimen.  Discussed with Dr. Grayland Ormond who will see him next week and can further adjust pain medications if needed.  PLAN: -Continue current scope of treatment -Continue MS Contin 30 mg every 12 hours -Refill oxycodone 10 mg tablets every 4 hours as needed for breakthrough pain, #60 -Continue gabapentin 300 mg 3 times daily -Continue daily bowel regimen -Will benefit from ACP conversation -Follow-up MyChart visit 2 to 3 weeks  Case and plan discussed with Dr. Grayland Ormond   Patient expressed understanding and was in agreement with this plan. He also understands that He can call the clinic at any time with any questions, concerns, or complaints.     Time Total: 15 minutes  Visit consisted of counseling and education dealing with the complex and emotionally intense issues of symptom management and palliative care in the setting of serious and potentially life-threatening illness.Greater than 50%  of this time was spent counseling and coordinating care related to the above assessment and plan.  Signed by: Altha Harm, PhD, NP-C

## 2020-11-24 NOTE — Patient Instructions (Signed)
[facility]  Discharge Instructions: Thank you for choosing [facility] to provide your oncology and hematology care.  If you have a lab appointment with the [facility], please go directly to the [facility] and check in at the registration area.  Wear comfortable clothing and clothing appropriate for easy access to any Portacath or PICC line.   We strive to give you quality time with your provider. You may need to reschedule your appointment if you arrive late (15 or more minutes).  Arriving late affects you and other patients whose appointments are after yours.  Also, if you miss three or more appointments without notifying the office, you may be dismissed from the clinic at the provider's discretion.      For prescription refill requests, have your pharmacy contact our office and allow 72 hours for refills to be completed.    Today you received the following chemotherapy and/or immunotherapy agents Taxol and Carboplatin      To help prevent nausea and vomiting after your treatment, we encourage you to take your nausea medication as directed.  BELOW ARE SYMPTOMS THAT SHOULD BE REPORTED IMMEDIATELY: *FEVER GREATER THAN 100.4 F (38 C) OR HIGHER *CHILLS OR SWEATING *NAUSEA AND VOMITING THAT IS NOT CONTROLLED WITH YOUR NAUSEA MEDICATION *UNUSUAL SHORTNESS OF BREATH *UNUSUAL BRUISING OR BLEEDING *URINARY PROBLEMS (pain or burning when urinating, or frequent urination) *BOWEL PROBLEMS (unusual diarrhea, constipation, pain near the anus) TENDERNESS IN MOUTH AND THROAT WITH OR WITHOUT PRESENCE OF ULCERS (sore throat, sores in mouth, or a toothache) UNUSUAL RASH, SWELLING OR PAIN  UNUSUAL VAGINAL DISCHARGE OR ITCHING   Items with * indicate a potential emergency and should be followed up as soon as possible or go to the Emergency Department if any problems should occur.  Please show the CHEMOTHERAPY ALERT CARD or IMMUNOTHERAPY ALERT CARD  at check-in to the Emergency Department and triage nurse.  Should you have questions after your visit or need to cancel or reschedule your appointment, please contact [facility]  [phone] and follow the prompts.  Office hours are 8:00 a.m. to 4:30 p.m. Monday - Friday. Please note that voicemails left after 4:00 p.m. may not be returned until the following business day.  We are closed weekends and major holidays. You have access to a nurse at all times for urgent questions. Please call the main number to the clinic [phone] and follow the prompts.  For any non-urgent questions, you may also contact your provider using MyChart. We now offer e-Visits for anyone 64 and older to request care online for non-urgent symptoms. For details visit [email or web address].   Also download the MyChart app! Go to the app store, search "MyChart", open the app, select [facility], and log in with your MyChart username and password.  Due to Covid, a mask is required upon entering the hospital/clinic. If you do not have a mask, one will be given to you upon arrival. For doctor visits, patients may have 1 support person aged 10 or older with them. For treatment visits, patients cannot have anyone with them due to current Covid guidelines and our immunocompromised population.

## 2020-11-25 ENCOUNTER — Ambulatory Visit
Admission: RE | Admit: 2020-11-25 | Discharge: 2020-11-25 | Disposition: A | Discharge status: Home / Self Care | Payer: Medicare HMO | Source: Ambulatory Visit | Attending: Radiation Oncology | Admitting: Radiation Oncology

## 2020-11-25 DIAGNOSIS — Z510 Encounter for antineoplastic radiation therapy: Secondary | ICD-10-CM | POA: Diagnosis not present

## 2020-11-27 NOTE — Progress Notes (Signed)
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  INTERVAL HISTORY: Patient returns to clinic today for further evaluation and consideration of cycle 5 and his final treatment of carboplatin and Taxol.  He continues to have left shoulder pain, but it is much better controlled on his current dose of narcotics.  He otherwise feels well and is asymptomatic.  He is tolerating his treatments well without significant side effects. He has no neurologic complaints.  He denies any recent fevers or illnesses.  He has a good appetite and denies weight loss.  He denies any chest pain, shortness of breath, cough, or hemoptysis.  He denies any abdominal pain.  He has no nausea, vomiting, constipation, or diarrhea.  He has no urinary complaints.  Patient offers no further specific complaints today.  REVIEW OF SYSTEMS:   Review of Systems  Constitutional: Negative.  Negative for fever, malaise/fatigue and weight loss.  Respiratory: Negative.  Negative for cough, hemoptysis and shortness of breath.   Cardiovascular: Negative.  Negative for chest pain and leg swelling.  Gastrointestinal:  Negative for abdominal pain.  Genitourinary: Negative.  Negative for dysuria.  Musculoskeletal:  Positive for joint pain.  Skin: Negative.  Negative for rash.  Neurological: Negative.  Negative for dizziness, focal weakness, weakness and headaches.   As per HPI. Otherwise, a complete review of systems is negative.  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 04/2018 NSTEMI/Cath: LM nl, LAD 50p, 46m/d diffuse-small caliber, LCX heavily Ca2+ sev prox/mid dzs, RCA 90 diff distal dzs into RPL and RPDA. EF 50-55%-->Med Rx.   Hepatic steatosis    History of 2019 novel coronavirus disease (COVID-19) 10/12/2020   History of echocardiogram    a. 04/2018 Echo: EF 55-60%, no rwma, mild MR, mildly dil LA. Nl RV fxn.   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 Quit 04/2018.    PAST SURGICAL HISTORY: Past Surgical History:  Procedure Laterality Date   BRAIN SURGERY     CARDIAC CATHETERIZATION     IR IMAGING GUIDED PORT INSERTION  10/28/2020   LEFT HEART CATH AND CORONARY ANGIOGRAPHY N/A 05/16/2018   Procedure: LEFT HEART CATH AND CORONARY ANGIOGRAPHY poss PCI;  Surgeon: [name], MD;  Location: [facility];  Service: Cardiovascular;  Laterality: N/A;   VIDEO BRONCHOSCOPY WITH ENDOBRONCHIAL NAVIGATION N/A 10/24/2020   Procedure: ROBOTIC ASSISTED VIDEO BRONCHOSCOPY WITH ENDOBRONCHIAL NAVIGATION;  Surgeon: Tyler Pita, MD;  Location: [facility];  Service: Pulmonary;  Laterality: N/A;    FAMILY HISTORY: Family History  Problem Relation Age of Onset   Heart Problems Mother    Heart attack Father    Diabetes Brother    Heart Problems Brother     ADVANCED DIRECTIVES (Y/N):  N  HEALTH MAINTENANCE: Social History   Tobacco Use   Smoking status: Former    Packs/day: 1.00    Years: 45.00    Pack years: 45.00    Types: Cigarettes    Quit date: 05/15/2018    Years since quitting: 2.5   Smokeless tobacco: Never  Vaping Use   Vaping Use: Never used  Substance Use Topics   Alcohol use: Never   Drug use: Never     Colonoscopy:  PAP:  Bone density:  Lipid  panel:  No Known Allergies  Current Outpatient Medications  Medication Sig Dispense Refill   aspirin EC 81 MG EC tablet Take 1 tablet (81 mg total) by mouth daily. 30 tablet 0   atorvastatin (LIPITOR) 80 MG tablet Take 1 tablet (80 mg total) by mouth daily at 6 PM. 90 tablet 0   ezetimibe (ZETIA) 10 MG tablet Take 1 tablet (10 mg total) by mouth daily. 90 tablet 0   gabapentin (NEURONTIN) 100 MG capsule Take 2 capsules (200 mg total) by mouth at bedtime for 7 days, THEN 3 capsules (300 mg total) at bedtime for 7 days, THEN 4 capsules (400 mg total) at bedtime for 16 days. Follow written titration schedule. 99 capsule 0   isosorbide mononitrate (IMDUR) 30 MG 24 hr tablet Take 1 tablet (30 mg total) by mouth daily. 90 tablet 3   lidocaine-prilocaine (EMLA) cream Apply to affected area once 30 g 3   losartan (COZAAR) 25 MG tablet Take 1 tablet (25 mg total) by mouth daily. 90 tablet 1   Menthol-Methyl Salicylate (ICY HOT) 01-09 % STCK Apply 1 application topically daily as needed (shoulder pain).     metFORMIN (GLUCOPHAGE) 500 MG tablet Take 500 mg by mouth every morning.     morphine (MS CONTIN) 15 MG 12 hr tablet Take 2 tablets (30 mg total) by mouth every 12 (twelve) hours. 30 tablet 0   naloxone (NARCAN) nasal spray 4 mg/0.1 mL SPRAY 1 SPRAY INTO ONE NOSTRIL AS DIRECTED FOR OPIOID OVERDOSE (TURN PERSON ON SIDE AFTER DOSE. IF NO RESPONSE IN 2-3 MINUTES OR PERSON RESPONDS BUT RELAPSES, REPEAT USING A NEW SPRAY DEVICE AND SPRAY INTO THE OTHER NOSTRIL. CALL 911 AFTER USE.) * EMERGENCY USE ONLY * 1 each 0   nitroGLYCERIN (NITROSTAT) 0.4 MG SL tablet Place 1 tablet (0.4 mg total) under the tongue every 5 (five) minutes as needed for chest pain. 25 tablet 3   ondansetron (ZOFRAN) 8 MG tablet Take 1 tablet (8 mg total) by mouth 2 (two) times daily as needed for refractory nausea / vomiting. 60 tablet 1   Oxycodone HCl 10 MG TABS Take 1 tablet (10 mg total) by mouth every 4 (four) hours as needed. 60  tablet 0   prochlorperazine (COMPAZINE) 10 MG tablet Take 1 tablet (10 mg total) by mouth every 6 (six) hours as needed (Nausea or vomiting). 60 tablet 1   sucralfate (CARAFATE) 1 g tablet Take 0.5 tablets (0.5 g total) by mouth 3 (three) times daily before meals. 60 tablet 5   vitamin B-12 (CYANOCOBALAMIN) 1000 MCG tablet Take 1,000 mcg by mouth daily.     dicyclomine (BENTYL) 10 MG capsule Take 1 capsule (10 mg total) by mouth 3 (three) times daily as needed for up to 14 days for spasms. 20 capsule 0   gabapentin (NEURONTIN) 300 MG capsule Take 1 capsule (300 mg total) by mouth 3 (three) times daily. 90 capsule 0   icosapent Ethyl (VASCEPA) 1 g capsule Take  2 capsules (2 g total) by mouth 2 (two) times daily. 120 capsule 6   No current facility-administered medications for this visit.    OBJECTIVE: Vitals:   12/01/20 0923  BP: 137/76  Pulse: (!) 103  Resp: 18  Temp: (!) 96.8 F (36 C)     Body mass index is 20.98 kg/m.    ECOG FS:1 - Symptomatic but completely ambulatory  General: Well-developed, well-nourished, no acute distress. Eyes: Pink conjunctiva, anicteric sclera. HEENT: Normocephalic, moist mucous membranes. Lungs: No audible wheezing or coughing. Heart: Regular rate and rhythm. Abdomen: Soft, nontender, no obvious distention. Musculoskeletal: No edema, cyanosis, or clubbing. Neuro: Alert, answering all questions appropriately. Cranial nerves grossly intact. Skin: No rashes or petechiae noted. Psych: Normal affect.  LAB RESULTS:  Lab Results  Component Value Date   NA 127 (L) 12/01/2020   K 3.9 12/01/2020   CL 91 (L) 12/01/2020   CO2 26 12/01/2020   GLUCOSE 187 (H) 12/01/2020   BUN 17 12/01/2020   CREATININE 0.73 12/01/2020   CALCIUM 8.9 12/01/2020   PROT 7.1 12/01/2020   ALBUMIN 3.1 (L) 12/01/2020   AST 21 12/01/2020   ALT 19 12/01/2020   ALKPHOS 107 12/01/2020   BILITOT 0.8 12/01/2020   GFRNONAA >60 12/01/2020   GFRAA >60 07/14/2019    Lab Results   Component Value Date   WBC 7.1 12/01/2020   NEUTROABS 5.3 12/01/2020   HGB 8.5 (L) 12/01/2020   HCT 25.4 (L) 12/01/2020   MCV 85.8 12/01/2020   PLT 331 12/01/2020     STUDIES: DG Cervical Spine With Flex & Extend  Result Date: 12/01/2020 CLINICAL DATA:  Neck pain. EXAM: CERVICAL SPINE COMPLETE WITH FLEXION AND EXTENSION VIEWS COMPARISON:  None. FINDINGS: There is no acute fracture or subluxation of the cervical spine. There is straightening of normal cervical lordosis which may be positional or due to muscle spasm. There is apparent bony fusion C1-C2. grade 1 C4-C5 retrolisthesis. The visualized posterior elements appear intact. Multilevel facet arthropathy. The soft tissues are unremarkable. Bilateral carotid bulb calcified plaques. Partially visualized right-sided Port-A-Cath. IMPRESSION: 1. No acute fracture or subluxation of the cervical spine. 2. Multilevel facet arthropathy. 3. Grade 1 C4-C5 retrolisthesis. Electronically Signed   By: Anner Crete M.D.   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  PLAN:    1. 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  PET scan results from October 04, 2020 reviewed independently with hypermetabolic bilateral pulmonary nodules without mediastinal or hilar lymphadenopathy.  Although imaging suggests stage IV disease, will treat patient as a stage IIIa.  MRI of the brain on October 08, 2020 was negative for disease.  Continue daily XRT completing treatment on December 07, 2020.  Proceed with cycle 5 of carboplatin and Taxol today.  Return to clinic in approximately 3 weeks with repeat imaging using PET scan.  If patient has resolution of bilateral pulmonary nodules will consider maintenance immunotherapy for 1 year.   2.  Shoulder pain: Significantly improved. Continue MS Contin, oxycodone, and gabapentin as prescribed.  XRT as above. 3.  Weight loss:  Weight has stabilized.  Appreciate dietary input. 4.  Hyponatremia: Chronic and unchanged.  Patient sodium is 127. 5.  Leukocytosis: Resolved. 6.  Anemia: Hemoglobin continues to slowly trend down and is now 8.5, monitor.  Patient expressed understanding and was in agreement with this plan. He also understands that  He can call clinic at any time with any questions, concerns, or complaints.   Cancer Staging Non-small cell cancer of left lung [facility]) Staging form: Lung, AJCC 8th Edition - Clinical stage from 10/28/2020: Stage IVA (cT4, cN0, pM1a) - Signed by Lloyd Huger, MD on 10/28/2020  Lloyd Huger, MD   12/01/2020 1:38 PM

## 2020-11-28 ENCOUNTER — Ambulatory Visit
Admission: RE | Admit: 2020-11-28 | Discharge: 2020-11-28 | Disposition: A | Discharge status: Home / Self Care | Payer: Medicare HMO | Source: Ambulatory Visit | Attending: Radiation Oncology | Admitting: Radiation Oncology

## 2020-11-28 DIAGNOSIS — C3412 Malignant neoplasm of upper lobe, left bronchus or lung: Secondary | ICD-10-CM | POA: Diagnosis not present

## 2020-11-28 DIAGNOSIS — Z510 Encounter for antineoplastic radiation therapy: Secondary | ICD-10-CM | POA: Insufficient documentation

## 2020-11-28 NOTE — Progress Notes (Signed)
Patient: Donald Lowery  Service Category: E/M  Provider: Gaspar Cola, MD  DOB: 1950-05-06  DOS: 11/29/2020  Referring Provider: Irean Hong, NP  MRN: [id number]  Setting: Ambulatory outpatient  PCP: [name], NP  Type: New Patient  Specialty: Interventional Pain Management    Location: Office  Delivery: Face-to-face     Primary Reason(s) for Visit: Encounter for initial evaluation of one or more chronic problems (new to examiner) potentially causing chronic pain, and posing a threat to normal musculoskeletal function. (Level of risk: High) CC: Arm Pain (left)  HPI  Donald Lowery is a 70 y.o. year old, male patient, who comes for the first time to our practice referred by Borders, [name], NP for our initial evaluation of his chronic pain. He has NSTEMI (non-ST elevated myocardial infarction) ([place]); 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[place]); Type 2 diabetes mellitus without complication, without long-term current use of insulin ([place]); Hypertension; Hyperlipidemia; Emphysema of lung ([place]); CAD (coronary artery disease); Aortic atherosclerosis ([place]); Hypercalcemia of malignancy; 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[place]); Cancer related pain; Chronic intractable pain; Radicular pain of shoulder (Left); Shoulder blade pain (Left); Cervicalgia (Left); and Chronic neuropathic pain on their problem list. Today he comes in for evaluation of his Arm Pain (left)  Pain Assessment: Location: Left, Right Arm (back) Radiating: Denies Onset: More than a month ago Duration: Chronic pain Quality: Aching, Throbbing Severity: 8 /10 (subjective, self-reported pain score)  Effect on ADL: limits my daily activities Timing: Constant Modifying factors: meds and heating pad BP: 128/71  HR: 92  Onset and Duration: Sudden Cause of pain:  Cancer Severity: Getting worse, NAS-11 at its worse: 10/10, NAS-11 at its best: 5/10, NAS-11 now: 6/10, and NAS-11 on the average: 6/10 Timing: Not influenced by the  time of the day Aggravating Factors: Bending, Kneeling, Lifiting, Motion, and Working Alleviating Factors: Hot packs, Lying down, Medications, Resting, and Walking Associated Problems: Constipation, Fatigue, Pain that wakes patient up, and Pain that does not allow patient to sleep Quality of Pain: Aching, Constant, Distressing, Nagging, Pulsating, Throbbing, and Uncomfortable Previous Examinations or Tests: Bone scan, CT scan, MRI scan, and X-rays Previous Treatments: Narcotic medications, Chemo and Rad treatment  According to the patient the primary area of pain is that of the left shoulder.  The patient was referred to our clinic secondary to cancer pain from a Pancoast tumor on the left lung apical area.  He has a 7.1 cm left apical mass is homogeneously hypermetabolic, consistent with bronchogenic carcinoma. The left apical mass abuts the mediastinum causing encasement of the left subclavian and vertebral arteries and probable involvement of the left vagal and recurrent laryngeal nerves with suggested left vocal cord paralysis.  In the anterior aspect of the left shoulder the pain seems to be affecting the area of the supraclavicular nerve, a portion of the axillary and superficial lateral brachial cutaneous nerve as well as a portion also affecting the radial, inferior lateral brachial cutaneous nerve distribution while on the posterior aspect of the shoulder it seems to be affecting the posterior aspect of the supraclavicular and the superficial lateral brachial cutaneous nerve distribution.  The patient presents with hoarseness secondary to possible left vocal cord paralysis.  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  Although he does not complain of cervicalgia, he does have decreased range of motion of the cervical spine with decreased lateral rotation towards the affected left  side and reproduction of the right shoulder pain upon attempting the  maneuver suggesting the possibility of some of the shoulder pain coming from the C3/C4 nerve root area.  The patient denies any upper extremity pain below the deltoid muscle area.  No numbness or weakness in the distal upper extremities.  The patient also does not have a winged scapula.  Based on the patient's symptoms and physical exam, he seems to have involvement of the recurrent laryngeal nerve, the upper portion of the cervical sympathetic chain, C 3, C4, and a portion of C5 nerve roots.  The patient also presented with some reproduction of symptoms with the left lateral rotation of the cervical spine.  Today I took the time to provide the patient with information regarding my pain practice. The patient was informed that my practice is divided into two sections: an interventional pain management section, as well as a completely separate and distinct medication management section. I explained that I have procedure days for my interventional therapies, and evaluation days for follow-ups and medication management. Because of the amount of documentation required during both, they are kept separated. This means that there is the possibility that he may be scheduled for a procedure on one day, and medication management the next. I have also informed him that because of staffing and facility limitations, I no longer take patients for medication management only. To illustrate the reasons for this, I gave the patient the example of surgeons, and how inappropriate it would be to refer a patient to his/her care, just to write for the post-surgical antibiotics on a surgery done by a different surgeon.   Pharmacotherapy: The patient indicates taking morphine ER 15 mg p.o. twice daily, PRN oxycodone IR 10 mg, and occasionally taking gabapentin 300 mg p.o. 3 times daily.  However, he indicated that he has not been taking the gabapentin regularly due to side effects.  Today we had a very long conversation with regards to  the use of the gabapentin, morphine, and the breakthrough medicine and how he should be using them for maximum effect.  I did provide him with a temporary prescription for gabapentin 100 mg with instructions to slowly titrated up as tolerated, using only the at bedtime dosing.  One of the complaints that he had was being too sleepy during the day and therefore I have recommended that he not take the gabapentin during the day.  Because of the triggering of the pain with the rotation of the cervical spine, today I have ordered x-rays of the cervical spine on flexion and extension to evaluate for possible pathology that may explain the patient's current symptoms.  The obvious thing would be to blame the Pancoast tumor for his current pain, but the distribution of his pain does not really go along with involvement of the inferior trunk or nerve roots of the brachial plexus suggesting that the problem may be elsewhere.  In addition to the x-rays, I will be scheduling the patient to return for a possible cervical epidural steroid injection as I believe that a portion of the pathology seems to reside in that region.  Today I took the time to inform the patient of the planned procedure including the risk and possible complications.  He understood and accepted.  Historic Controlled Substance Pharmacotherapy Review  PMP and historical list of controlled substances: Oxycodone IR 10 mg 1 tablet p.o. every 4 hours as needed for pain; morphine ER 15 mg, 1 tab p.o. twice daily; oxycodone IR  5 mg 1 tab p.o. 4 times daily Current opioid analgesics: Morphine ER 15 mg, 1 tab p.o. twice daily (filled 11/22/2020) (30 MME), oxycodone IR 10 mg, 1 tab p.o. every 4 hours as needed for breakthrough pain (filled 11/24/2020) (90 MME) MME/day: 120 mg/day  Historical Monitoring: The patient  reports no history of drug use. List of all UDS Test(s): No results found for: MDMA, COCAINSCRNUR, [place], [place], CANNABQUANT, THCU,  [place] List of other Serum/Urine Drug Screening Test(s):  No results found for: AMPHSCRSER, BARBSCRSER, BENZOSCRSER, COCAINSCRSER, COCAINSCRNUR, PCPSCRSER, PCPQUANT, THCSCRSER, THCU, CANNABQUANT, OPIATESCRSER, OXYSCRSER, PROPOXSCRSER, ETH Historical Background Evaluation: [place] PMP: PDMP reviewed during this encounter. Online review of the past 14-monthperiod conducted.             PMP NARX Score Report:  Narcotic: 372 Sedative: 160 Stimulant: 000 [place] Department of public safety, offender search: (Editor, commissioningInformation) Non-contributory Risk Assessment Profile: Aberrant behavior: None observed or detected today Risk factors for fatal opioid overdose: None identified today PMP NARX Overdose Risk Score: 370 Fatal overdose hazard ratio (HR): Calculation deferred Non-fatal overdose hazard ratio (HR): Calculation deferred Risk of opioid abuse or dependence: 0.7-3.0% with doses ? 36 MME/day and 6.1-26% with doses ? 120 MME/day. Substance use disorder (SUD) risk level: See below Personal History of Substance Abuse (SUD-Substance use disorder):  Alcohol: Negative  Illegal Drugs: Negative  Rx Drugs: Negative  ORT Risk Level calculation: Low Risk  Opioid Risk Tool - 11/29/20 1406       Family History of Substance Abuse   Alcohol Negative    Illegal Drugs Negative    Rx Drugs Negative      Personal History of Substance Abuse   Alcohol Negative    Illegal Drugs Negative    Rx Drugs Negative      Age   Age between 14-45years  No      History of Preadolescent Sexual Abuse   History of Preadolescent Sexual Abuse Negative or Male      Psychological Disease   Psychological Disease Negative    Depression Negative      Total Score   Opioid Risk Tool Scoring 0    Opioid Risk Interpretation Low Risk            ORT Scoring interpretation table:  Score <3 = Low Risk for SUD  Score between 4-7 = Moderate Risk for SUD  Score >8 = High Risk for Opioid Abuse   PHQ-2 Depression Scale:  Total  score:    PHQ-2 Scoring interpretation table: (Score and probability of major depressive disorder)  Score 0 = No depression  Score 1 = 15.4% Probability  Score 2 = 21.1% Probability  Score 3 = 38.4% Probability  Score 4 = 45.5% Probability  Score 5 = 56.4% Probability  Score 6 = 78.6% Probability   PHQ-9 Depression Scale:  Total score:    PHQ-9 Scoring interpretation table:  Score 0-4 = No depression  Score 5-9 = Mild depression  Score 10-14 = Moderate depression  Score 15-19 = Moderately severe depression  Score 20-27 = Severe depression (2.4 times higher risk of SUD and 2.89 times higher risk of overuse)   Pharmacologic Plan: As per protocol, I have not taken over any controlled substance management, pending the results of ordered tests and/or consults.            Initial impression: Pending review of available data and ordered tests.  Meds   Current Outpatient Medications:    aspirin EC 81  MG EC tablet, Take 1 tablet (81 mg total) by mouth daily., Disp: 30 tablet, Rfl: 0   atorvastatin (LIPITOR) 80 MG tablet, Take 1 tablet (80 mg total) by mouth daily at 6 PM., Disp: 90 tablet, Rfl: 0   ezetimibe (ZETIA) 10 MG tablet, Take 1 tablet (10 mg total) by mouth daily., Disp: 90 tablet, Rfl: 0   gabapentin (NEURONTIN) 100 MG capsule, Take 2 capsules (200 mg total) by mouth at bedtime for 7 days, THEN 3 capsules (300 mg total) at bedtime for 7 days, THEN 4 capsules (400 mg total) at bedtime for 16 days. Follow written titration schedule., Disp: 99 capsule, Rfl: 0   lidocaine-prilocaine (EMLA) cream, Apply to affected area once, Disp: 30 g, Rfl: 3   losartan (COZAAR) 25 MG tablet, Take 1 tablet (25 mg total) by mouth daily., Disp: 90 tablet, Rfl: 1   Menthol-Methyl Salicylate (ICY HOT) 26-37 % STCK, Apply 1 application topically daily as needed (shoulder pain)., Disp: , Rfl:    metFORMIN (GLUCOPHAGE) 500 MG tablet, Take 500 mg by mouth every morning., Disp: , Rfl:    morphine (MS CONTIN)  15 MG 12 hr tablet, Take 2 tablets (30 mg total) by mouth every 12 (twelve) hours., Disp: 30 tablet, Rfl: 0   naloxone (NARCAN) nasal spray 4 mg/0.1 mL, SPRAY 1 SPRAY INTO ONE NOSTRIL AS DIRECTED FOR OPIOID OVERDOSE (TURN PERSON ON SIDE AFTER DOSE. IF NO RESPONSE IN 2-3 MINUTES OR PERSON RESPONDS BUT RELAPSES, REPEAT USING A NEW SPRAY DEVICE AND SPRAY INTO THE OTHER NOSTRIL. CALL 911 AFTER USE.) * EMERGENCY USE ONLY *, Disp: 1 each, Rfl: 0   nitroGLYCERIN (NITROSTAT) 0.4 MG SL tablet, Place 1 tablet (0.4 mg total) under the tongue every 5 (five) minutes as needed for chest pain., Disp: 25 tablet, Rfl: 3   ondansetron (ZOFRAN) 8 MG tablet, Take 1 tablet (8 mg total) by mouth 2 (two) times daily as needed for refractory nausea / vomiting., Disp: 60 tablet, Rfl: 1   Oxycodone HCl 10 MG TABS, Take 1 tablet (10 mg total) by mouth every 4 (four) hours as needed., Disp: 60 tablet, Rfl: 0   prochlorperazine (COMPAZINE) 10 MG tablet, Take 1 tablet (10 mg total) by mouth every 6 (six) hours as needed (Nausea or vomiting)., Disp: 60 tablet, Rfl: 1   sucralfate (CARAFATE) 1 g tablet, Take 0.5 tablets (0.5 g total) by mouth 3 (three) times daily before meals., Disp: 60 tablet, Rfl: 5   vitamin B-12 (CYANOCOBALAMIN) 1000 MCG tablet, Take 1,000 mcg by mouth daily., Disp: , Rfl:    dicyclomine (BENTYL) 10 MG capsule, Take 1 capsule (10 mg total) by mouth 3 (three) times daily as needed for up to 14 days for spasms., Disp: 20 capsule, Rfl: 0   gabapentin (NEURONTIN) 300 MG capsule, Take 1 capsule (300 mg total) by mouth 3 (three) times daily., Disp: 90 capsule, Rfl: 0   icosapent Ethyl (VASCEPA) 1 g capsule, Take 2 capsules (2 g total) by mouth 2 (two) times daily., Disp: 120 capsule, Rfl: 6   isosorbide mononitrate (IMDUR) 30 MG 24 hr tablet, Take 1 tablet (30 mg total) by mouth daily., Disp: 90 tablet, Rfl: 3  Imaging Review  Shoulder Imaging: Shoulder-L DG: Results for orders placed during the hospital encounter of  09/21/20 DG Shoulder Left  Narrative CLINICAL DATA:  Left arm pain.  EXAM: LEFT SHOULDER - 2+ VIEW  COMPARISON:  CT chest 05/15/2018  FINDINGS: There is no evidence of fracture or dislocation.  Degenerative changes of the acromioclavicular joint. Likely enthesopathy along the inferior distal clavicle. Soft tissues are unremarkable.  IMPRESSION: No acute displaced fracture or dislocation.   Electronically Signed By: Iven Finn M.D. On: 09/21/2020 06:31  NM PET SCAN CLINICAL DATA:  Initial treatment strategy for left upper lobe lung mass, presumed bronchogenic carcinoma. Back and left shoulder pain.   EXAM: NUCLEAR MEDICINE PET SKULL BASE TO THIGH   TECHNIQUE: 7.94 mCi F-18 FDG was injected intravenously. Full-ring PET imaging was performed from the skull base to thigh after the radiotracer. CT data was obtained and used for attenuation correction and anatomic localization.   Fasting blood glucose: 127 mg/dl   COMPARISON:  Chest CT 09/21/2020. Abdominal CT 09/17/2020 and chest CT 02/10/2019.   FINDINGS: Mediastinal blood pool activity: SUV max 2.3   NECK:   No hypermetabolic cervical lymph nodes are identified.There are no lesions of the pharyngeal mucosal space. 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 No focal mucosal lesion identified on CT images.   Incidental CT findings: Bilateral carotid atherosclerosis. [place]ela of prior right occipital craniotomy.   CHEST:   The previously demonstrated large left apical mass is homogeneously hypermetabolic with an SUV max of 16.0. This measures approximately 7.1 x 6.0 cm on image 76/3. As noted previously, this mass contacts the aortic arch and encases the proximal left subclavian and vertebral arteries. Probable superior mediastinal fat invasion, but no apparent involvement of the trachea or esophagus. There is no osseous erosion. There  are no hypermetabolic mediastinal, hilar or axillary lymph nodes. However, there are several hypermetabolic right lung nodules. The largest nodules include a 9 mm right upper lobe nodule on image 81/3 (SUV max 3.8), a 1.1 cm nodule in the superior segment of the right lower lobe on image 98/3 (SUV max 6.8) and a 1.1 cm right middle lobe nodule on image 115/3 (SUV max 2.3). There are additional small pulmonary nodules bilaterally.   Incidental CT findings: Atherosclerosis of the aorta, great vessels and coronary arteries. Underlying mild to moderate paraseptal emphysema with a small left pleural effusion.   ABDOMEN/PELVIS:   There is no hypermetabolic activity within the liver, adrenal glands, spleen or pancreas. There is no hypermetabolic nodal activity.   Incidental CT findings: Calcified gallstones. Aortic and branch vessel atherosclerosis.   SKELETON:   There is no hypermetabolic activity to suggest osseous metastatic disease. No left apical rib destruction or abnormal metabolic activity.   Incidental CT findings: Inferior right occipital craniotomy.   IMPRESSION: 1. The 7.1 cm left apical mass is homogeneously hypermetabolic, consistent with bronchogenic carcinoma. There are several hypermetabolic pulmonary nodules bilaterally consistent with pulmonary metastases. No mediastinal adenopathy or distant metastases identified. 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 2. The left apical mass abuts the mediastinum causing encasement of the left subclavian and vertebral arteries and probable involvement of the left vagal and recurrent laryngeal nerves with suggested left vocal cord paralysis. Correlate clinically. No rib destruction identified. 3. Cholelithiasis. 4. Aortic Atherosclerosis (ICD10-I70.0) and Emphysema (ICD10-J43.9).     Electronically Signed   By: Richardean Sale M.D.   On: 10/05/2020 09:49     CHEST CT W/  CONTRAST CLINICAL DATA:  Abnormal x-ray.  Cancer of unknown primary   EXAM: CT CHEST WITH CONTRAST   TECHNIQUE: Multidetector CT imaging of the chest was performed during intravenous contrast administration.   CONTRAST:  75m OMNIPAQUE IOHEXOL 300 MG/ML  SOLN  COMPARISON:  Chest x-ray from earlier today and chest CT 02/10/2019   FINDINGS: Cardiovascular: Normal heart size. Trace low-density pericardial fluid anteriorly. Aortic and coronary atherosclerosis. Left subclavian and vertebral encasement described below.   Mediastinum/Nodes: A left apical tumor invades the mediastinum to the level of contacting the left aspects of the esophagus and trachea and encasing the left subclavian artery and vertebral origin. No visible fat plane between the mass and T3 vertebra but also no bony erosion seen. No contralateral adenopathy noted.   Lungs/Pleura: 7 cm left upper lobe mass with left mediastinal/subpleural invasion noted above. Bronchiectatic scar-like appearance is seen more laterally. New pulmonary nodules scattered in the right lung, most likely metastatic. The largest measures 8 mm at the superior segment right lower lobe. No edema, effusion, or pneumothorax. Paraseptal emphysema   Upper Abdomen: Cholelithiasis. Incomplete distension of the gallbladder. No visible upper abdominal metastasis. Fatty Bochdalek's hernia.   Musculoskeletal: No acute or aggressive finding.   IMPRESSION: 1. 7 cm left upper lobe mass with mediastinal invasion and left subclavian/proximal vertebral encasement. The mass contacts the esophagus, trachea, and thoracic spine without visible invasion of these structures. 2. Subcentimeter pulmonary nodules that are new from 2020 and most likely pulmonary metastases.     Electronically Signed   By: Monte Fantasia M.D.   On: 09/21/2020 08:05   Complexity Note: Imaging results reviewed. Results shared with Mr. Hartsell, using Layman's terms.                         ROS  Cardiovascular: Heart trouble and Heart attack ( Date: 2 years ago) Pulmonary or Respiratory: Lung problems, cancer of the lung Neurological: No reported neurological signs or symptoms such as seizures, abnormal skin sensations, urinary and/or fecal incontinence, being born with an abnormal open spine and/or a tethered spinal cord Psychological-Psychiatric: No reported psychological or psychiatric signs or symptoms such as difficulty sleeping, anxiety, depression, delusions or hallucinations (schizophrenial), mood swings (bipolar disorders) or suicidal ideations or attempts Gastrointestinal: Irregular, infrequent bowel movements (Constipation) Genitourinary: No reported renal or genitourinary signs or symptoms such as difficulty voiding or producing urine, peeing blood, non-functioning kidney, kidney stones, difficulty emptying the bladder, difficulty controlling the flow of urine, or chronic kidney disease Hematological: No reported hematological signs or symptoms such as prolonged bleeding, low or poor functioning platelets, bruising or bleeding easily, hereditary bleeding problems, low energy levels due to low hemoglobin or being anemic Endocrine: High blood sugar controlled without the use of insulin (NIDDM) Rheumatologic: No reported rheumatological signs and symptoms such as fatigue, joint pain, tenderness, swelling, redness, heat, stiffness, decreased range of motion, with or without associated rash Musculoskeletal: Negative for myasthenia gravis, muscular dystrophy, multiple sclerosis or malignant hyperthermia Work History: Disabled  Allergies  Mr. Grimaldo has No Known Allergies.  Laboratory Chemistry Profile   Renal Lab Results  Component Value Date   BUN 14 11/24/2020   CREATININE 0.86 11/24/2020   BCR 14 12/16/2018   GFRAA >60 07/14/2019   GFRNONAA >60 11/24/2020     Electrolytes Lab Results  Component Value Date   NA 127 (L) 11/24/2020   K 4.1 11/24/2020    CL 91 (L) 11/24/2020   CALCIUM 9.0 11/24/2020     Hepatic Lab Results  Component Value Date   AST 20 11/24/2020   ALT 21 11/24/2020   ALBUMIN 2.9 (L) 11/24/2020   ALKPHOS 104 11/24/2020   LIPASE 36 09/16/2020     ID Lab Results  Component Value Date   HIV Non Reactive 05/15/2018   SARSCOV2NAA POSITIVE (A) 10/12/2020     Bone No results found for: VD25OH, [id number], ZY2482NO0, BB0488QB1, 25OHVITD1, 25OHVITD2, 25OHVITD3, TESTOFREE, TESTOSTERONE   Endocrine Lab Results  Component Value Date   GLUCOSE 172 (H) 11/24/2020   HGBA1C 5.8 (H) 05/15/2018   TSH 1.930 11/17/2020     Neuropathy Lab Results  Component Value Date   HGBA1C 5.8 (H) 05/15/2018   HIV Non Reactive 05/15/2018     CNS No results found for: COLORCSF, APPEARCSF, RBCCOUNTCSF, WBCCSF, POLYSCSF, LYMPHSCSF, EOSCSF, PROTEINCSF, GLUCCSF, JCVIRUS, CSFOLI, IGGCSF, LABACHR, ACETBL, LABACHR, ACETBL   Inflammation (CRP: Acute  ESR: Chronic) No results found for: CRP, ESRSEDRATE, LATICACIDVEN   Rheumatology No results found for: RF, ANA, LABURIC, URICUR, LYMEIGGIGMAB, LYMEABIGMQN, HLAB27   Coagulation Lab Results  Component Value Date   INR 0.94 05/15/2018   LABPROT 12.5 05/15/2018   APTT 31 05/15/2018   PLT 330 11/24/2020     Cardiovascular Lab Results  Component Value Date   TROPONINI 5.69 (HH) 05/17/2018   HGB 9.0 (L) 11/24/2020   HCT 26.8 (L) 11/24/2020     Screening Lab Results  Component Value Date   SARSCOV2NAA POSITIVE (A) 10/12/2020   HIV Non Reactive 05/15/2018     Cancer No results found for: CEA, CA125, LABCA2   Allergens No results found for: ALMOND, APPLE, ASPARAGUS, AVOCADO, BANANA, BARLEY, BASIL, BAYLEAF, GREENBEAN, LIMABEAN, WHITEBEAN, BEEFIGE, REDBEET, BLUEBERRY, BROCCOLI, CABBAGE, MELON, CARROT, CASEIN, CASHEWNUT, CAULIFLOWER, CELERY     Note: Lab results reviewed.  [place]  Drug: Mr. Berrios  reports no history of drug use. Alcohol:  reports no history of alcohol  use. Tobacco:  reports that he quit smoking about 2 years ago. His smoking use included cigarettes. He has a 45.00 pack-year smoking history. He has never used smokeless tobacco. Medical:  has a past medical history of Aortic atherosclerosis ([place]), Bochdalek hernia (09/21/2020), Coronary artery disease, Hepatic steatosis, History of 2019 novel coronavirus disease (COVID-19) (10/12/2020), History of echocardiogram, Hyperlipidemia, Hypertension, NSTEMI (non-ST elevated myocardial infarction) ([place]) (05/15/2018), 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[place]) (09/21/2020), Paraseptal emphysema ([place]), T2DM (type 2 diabetes mellitus) ([place]), and Tobacco abuse. Family: family history includes Diabetes in his brother; Heart Problems in his brother and mother; Heart attack in his father.  Past Surgical History:  Procedure Laterality Date   BRAIN SURGERY     CARDIAC CATHETERIZATION     IR IMAGING GUIDED PORT INSERTION  10/28/2020   LEFT HEART CATH AND CORONARY ANGIOGRAPHY N/A 05/16/2018   Procedure: LEFT HEART CATH AND CORONARY ANGIOGRAPHY poss PCI;  Surgeon: [name], MD;  Location: [facility];  Service: Cardiovascular;  Laterality: N/A;   VIDEO BRONCHOSCOPY WITH ENDOBRONCHIAL NAVIGATION N/A 10/24/2020   Procedure: ROBOTIC ASSISTED VIDEO BRONCHOSCOPY WITH ENDOBRONCHIAL NAVIGATION;  Surgeon: Tyler Pita, MD;  Location: [facility];  Service: Pulmonary;  Laterality: N/A;   Active Ambulatory Problems    Diagnosis Date Noted   NSTEMI (non-ST elevated myocardial infarction) ([place]) 05/15/2018   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[place]) 09/21/2020   Type 2 diabetes mellitus without complication, without long-term current use of insulin ([place]) 05/31/2020   Hypertension 10/05/2020   Hyperlipidemia 10/05/2020   Emphysema of lung ([place]) 05/15/2018   CAD (coronary artery disease) 05/16/2018   Aortic atherosclerosis ([place]) 02/10/2019   Hypercalcemia of malignancy 11/10/2020   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[place])  09/21/2020   Cancer related pain 09/21/2020   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/21/2020  Radicular pain of shoulder (Left) 11/29/2020   Shoulder blade pain (Left) 11/29/2020   Cervicalgia (Left) 11/29/2020   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   Resolved Ambulatory Problems    Diagnosis Date Noted   No Resolved Ambulatory Problems   Past Medical History:  Diagnosis Date   [place] hernia 09/21/2020   Coronary artery disease    Hepatic steatosis    History of 2019 novel coronavirus disease (COVID-19) 10/12/2020   History of echocardiogram    Paraseptal emphysema (HCC)    T2DM (type 2 diabetes mellitus) ([place])    Tobacco abuse    Constitutional Exam  General appearance: Well nourished, well developed, and well hydrated. In no apparent acute distress Vitals:   11/29/20 1355  BP: 128/71  Pulse: 92  Temp: (!) 97.3 F (36.3 C)  SpO2: 100%  Weight: 127 lb (57.6 kg)  Height: _0  (1.651 m)   BMI Assessment: Estimated body mass index is 21.13 kg/m as calculated from the following:   Height as of this encounter: _1  (1.651 m).   Weight as of this encounter: 127 lb (57.6 kg).  BMI interpretation table: BMI level Category Range association with higher incidence of chronic pain  <18 kg/m2 Underweight   18.5-24.9 kg/m2 Ideal body weight   25-29.9 kg/m2 Overweight Increased incidence by 20%  30-34.9 kg/m2 Obese (Class I) Increased incidence by 68%  35-39.9 kg/m2 Severe obesity (Class II) Increased incidence by 136%  >40 kg/m2 Extreme obesity (Class III) Increased incidence by 254%   Patient's current BMI Ideal Body weight  Body mass index is 21.13 kg/m. Ideal body weight: 61.5 kg (135 lb 9.3 oz)   BMI Readings from Last 4 Encounters:  11/29/20 21.13 kg/m  11/24/20 20.56 kg/m  11/17/20 20.66 kg/m  11/10/20 20.47 kg/m   Wt Readings from Last 4 Encounters:  11/29/20 127 lb (57.6 kg)  11/24/20 127 lb 6.4 oz (57.8 kg)  11/17/20 128 lb (58.1 kg)  11/10/20 126 lb 12.8 oz  (57.5 kg)    Psych/Mental status: Alert, oriented x 3 (person, place, & time)       Eyes: PERLA Respiratory: No evidence of acute respiratory distress  Assessment  Primary Diagnosis & Pertinent Problem List: The primary encounter diagnosis was Chronic pain syndrome. 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  Visit Diagnosis (New problems to examiner): 1. Chronic pain syndrome   2. Cancer related pain   3. 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 Chronic intractable pain   6. Radicular pain of shoulder (Left)   7. Shoulder blade pain (Left)   8. Cervicalgia (Left)   9. Chronic neuropathic pain    Plan of Care (Initial workup plan)  Note: Mr. Galluzzo was reminded that as per protocol, today's visit has been an evaluation only. We have not taken over the patient's controlled substance management.  Problem-specific plan: No problem-specific Assessment & Plan notes found for this encounter. Lab Orders  No laboratory test(s) ordered today   Imaging Orders  DG Cervical Spine With Flex & Extend   Referral Orders  No referral(s) requested today   Procedure Orders  Cervical Epidural Injection   Pharmacotherapy (current): Medications ordered:  Meds ordered this encounter  Medications   gabapentin (  NEURONTIN) 100 MG capsule    Sig: Take 2 capsules (200 mg total) by mouth at bedtime for 7 days, THEN 3 capsules (300 mg total) at bedtime for 7 days, THEN 4 capsules (400 mg total) at bedtime for 16 days. Follow written titration schedule.    Dispense:  99 capsule    Refill:  0    Fill one day early if pharmacy is closed on scheduled refill date. May substitute for generic if available.    Medications  administered during this visit: Abdelaziz Westenberger. Portman had no medications administered during this visit.   Pharmacological management options:  Opioid Analgesics: The patient was informed that there is no guarantee that he would be a candidate for opioid analgesics. The decision will be made following CDC guidelines. This decision will be based on the results of diagnostic studies, as well as Mr. Nicolls risk profile.   Membrane stabilizer: To be determined at a later time  Muscle relaxant: To be determined at a later time  NSAID: To be determined at a later time  Other analgesic(s): To be determined at a later time   Interventional management options: Mr. Streater was informed that there is no guarantee that he would be a candidate for interventional therapies. The decision will be based on the results of diagnostic studies, as well as Mr. Warzecha risk profile.  Procedure(s) under consideration:  Pending results of ordered studies    Interventional Therapies  Risk  Complexity Considerations:   Estimated body mass index is 21.13 kg/m as calculated from the following:   Height as of this encounter: _0  (1.651 m).   Weight as of this encounter: 127 lb (57.6 kg). WNL   Planned  Pending:   Diagnostic left C7-T1 cervical ESI #1    Under consideration:   Diagnostic left C7-T1 cervical ESI #1    Completed:   None at this time   Therapeutic  Palliative (PRN) options:   None established   Provider-requested follow-up: Return for Procedure (no sedation): (L) Cervical ESI #1.  Future Appointments  Date Time Provider [place]  11/30/2020  9:45 AM [facility]-RO LINAC 1 [facility]RAD[facility] None  12/01/2020  9:15 AM CC[facility]-PORT FLUSH [facility]MED[facility] None  12/01/2020  9:45 AM [place] Ormond, Kathlene November, MD [facility] None  12/01/2020 10:15 AM [facility]- MO INFUSION CHAIR 7 [facility]MED[facility] None  12/01/2020  1:30 PM CCAR-RO LINAC 1 [facility]-RAD[facility] None  12/02/2020 10:00 AM CCAR-RO LINAC 1 [facility]RAD[facility]C None  12/05/2020 10:00 AM CCAR-RO  LINAC 1 [facility]-RADONC None  12/06/2020 10:00 AM CCAR-RO LINAC 1 [facility]RAD[facility]C None  12/07/2020 10:00 AM CCAR-RO LINAC 1 [facility]-RAD[facility]C None  12/08/2020 10:30 AM Borders, [name], NP [facility]MED[facility] None    Note by: Gaspar Cola, MD Date: 11/29/2020; Time: 5:16 PM

## 2020-11-29 ENCOUNTER — Ambulatory Visit
Admission: RE | Admit: 2020-11-29 | Discharge: 2020-11-29 | Disposition: A | Discharge status: Home / Self Care | Payer: Medicare HMO | Source: Ambulatory Visit | Attending: Radiation Oncology | Admitting: Radiation Oncology

## 2020-11-29 ENCOUNTER — Other Ambulatory Visit: Payer: Self-pay

## 2020-11-29 ENCOUNTER — Ambulatory Visit
Admission: RE | Admit: 2020-11-29 | Discharge: 2020-11-29 | Disposition: A | Discharge status: Home / Self Care | Payer: Medicare HMO | Attending: Pain Medicine | Admitting: Pain Medicine

## 2020-11-29 ENCOUNTER — Encounter: Payer: Self-pay | Admitting: Pain Medicine

## 2020-11-29 ENCOUNTER — Ambulatory Visit (HOSPITAL_BASED_OUTPATIENT_CLINIC_OR_DEPARTMENT_OTHER): Payer: Medicare HMO | Admitting: Pain Medicine

## 2020-11-29 ENCOUNTER — Ambulatory Visit
Admission: RE | Admit: 2020-11-29 | Discharge: 2020-11-29 | Disposition: A | Discharge status: Home / Self Care | Payer: Medicare HMO | Source: Ambulatory Visit | Attending: Pain Medicine | Admitting: Pain Medicine

## 2020-11-29 VITALS — BP 128/71 | HR 92 | Temp 97.3°F | Ht 65.0 in | Wt 127.0 lb

## 2020-11-29 DIAGNOSIS — M5412 Radiculopathy, cervical region: Secondary | ICD-10-CM

## 2020-11-29 DIAGNOSIS — M898X1 Other specified disorders of bone, shoulder: Secondary | ICD-10-CM | POA: Insufficient documentation

## 2020-11-29 DIAGNOSIS — G8929 Other chronic pain: Secondary | ICD-10-CM | POA: Insufficient documentation

## 2020-11-29 DIAGNOSIS — G894 Chronic pain syndrome: Secondary | ICD-10-CM | POA: Insufficient documentation

## 2020-11-29 DIAGNOSIS — C3412 Malignant neoplasm of upper lobe, left bronchus or lung: Secondary | ICD-10-CM | POA: Insufficient documentation

## 2020-11-29 DIAGNOSIS — M542 Cervicalgia: Secondary | ICD-10-CM | POA: Insufficient documentation

## 2020-11-29 DIAGNOSIS — Z510 Encounter for antineoplastic radiation therapy: Secondary | ICD-10-CM | POA: Diagnosis not present

## 2020-11-29 DIAGNOSIS — C3492 Malignant neoplasm of unspecified part of left bronchus or lung: Secondary | ICD-10-CM | POA: Insufficient documentation

## 2020-11-29 DIAGNOSIS — G893 Neoplasm related pain (acute) (chronic): Secondary | ICD-10-CM | POA: Insufficient documentation

## 2020-11-29 DIAGNOSIS — M792 Neuralgia and neuritis, unspecified: Secondary | ICD-10-CM | POA: Insufficient documentation

## 2020-11-29 MED ORDER — GABAPENTIN 100 MG PO CAPS: ORAL_CAPSULE | ORAL | 0 refills | Status: DC

## 2020-11-29 NOTE — Progress Notes (Signed)
Safety precautions to be maintained throughout the outpatient stay will include: orient to surroundings, keep bed in low position, maintain call bell within reach at all times, provide assistance with transfer out of bed and ambulation.  

## 2020-11-30 ENCOUNTER — Ambulatory Visit
Admission: RE | Admit: 2020-11-30 | Discharge: 2020-11-30 | Disposition: A | Discharge status: Home / Self Care | Payer: Medicare HMO | Source: Ambulatory Visit | Attending: Radiation Oncology | Admitting: Radiation Oncology

## 2020-11-30 DIAGNOSIS — Z510 Encounter for antineoplastic radiation therapy: Secondary | ICD-10-CM | POA: Diagnosis not present

## 2020-12-01 ENCOUNTER — Inpatient Hospital Stay (HOSPITAL_BASED_OUTPATIENT_CLINIC_OR_DEPARTMENT_OTHER): Payer: Medicare HMO | Admitting: Oncology

## 2020-12-01 ENCOUNTER — Ambulatory Visit
Admission: RE | Admit: 2020-12-01 | Discharge: 2020-12-01 | Disposition: A | Discharge status: Home / Self Care | Payer: Medicare HMO | Source: Ambulatory Visit | Attending: Radiation Oncology | Admitting: Radiation Oncology

## 2020-12-01 ENCOUNTER — Other Ambulatory Visit: Payer: Self-pay

## 2020-12-01 ENCOUNTER — Encounter: Payer: Self-pay | Admitting: Oncology

## 2020-12-01 ENCOUNTER — Inpatient Hospital Stay: Payer: Medicare HMO | Attending: Oncology

## 2020-12-01 ENCOUNTER — Inpatient Hospital Stay: Payer: Medicare HMO

## 2020-12-01 VITALS — BP 137/76 | HR 103 | Temp 96.8°F | Resp 18 | Wt 126.1 lb

## 2020-12-01 VITALS — HR 97

## 2020-12-01 DIAGNOSIS — E871 Hypo-osmolality and hyponatremia: Secondary | ICD-10-CM | POA: Insufficient documentation

## 2020-12-01 DIAGNOSIS — C3412 Malignant neoplasm of upper lobe, left bronchus or lung: Secondary | ICD-10-CM | POA: Insufficient documentation

## 2020-12-01 DIAGNOSIS — D649 Anemia, unspecified: Secondary | ICD-10-CM | POA: Insufficient documentation

## 2020-12-01 DIAGNOSIS — C3492 Malignant neoplasm of unspecified part of left bronchus or lung: Secondary | ICD-10-CM

## 2020-12-01 DIAGNOSIS — Z5111 Encounter for antineoplastic chemotherapy: Secondary | ICD-10-CM | POA: Insufficient documentation

## 2020-12-01 DIAGNOSIS — Z510 Encounter for antineoplastic radiation therapy: Secondary | ICD-10-CM | POA: Diagnosis not present

## 2020-12-01 LAB — CBC WITH DIFFERENTIAL/PLATELET
Abs Immature Granulocytes: 0.33 10*3/uL — ABNORMAL HIGH (ref 0.00–0.07)
Basophils Absolute: 0.1 10*3/uL (ref 0.0–0.1)
Basophils Relative: 1 %
Eosinophils Absolute: 0 10*3/uL (ref 0.0–0.5)
Eosinophils Relative: 0 %
HCT: 25.4 % — ABNORMAL LOW (ref 39.0–52.0)
Hemoglobin: 8.5 g/dL — ABNORMAL LOW (ref 13.0–17.0)
Immature Granulocytes: 5 %
Lymphocytes Relative: 10 %
Lymphs Abs: 0.7 10*3/uL (ref 0.7–4.0)
MCH: 28.7 pg (ref 26.0–34.0)
MCHC: 33.5 g/dL (ref 30.0–36.0)
MCV: 85.8 fL (ref 80.0–100.0)
Monocytes Absolute: 0.7 10*3/uL (ref 0.1–1.0)
Monocytes Relative: 10 %
Neutro Abs: 5.3 10*3/uL (ref 1.7–7.7)
Neutrophils Relative %: 74 %
Platelets: 331 10*3/uL (ref 150–400)
RBC: 2.96 MIL/uL — ABNORMAL LOW (ref 4.22–5.81)
RDW: 15.8 % — ABNORMAL HIGH (ref 11.5–15.5)
WBC: 7.1 10*3/uL (ref 4.0–10.5)
nRBC: 0 % (ref 0.0–0.2)

## 2020-12-01 LAB — COMPREHENSIVE METABOLIC PANEL
ALT: 19 U/L (ref 0–44)
AST: 21 U/L (ref 15–41)
Albumin: 3.1 g/dL — ABNORMAL LOW (ref 3.5–5.0)
Alkaline Phosphatase: 107 U/L (ref 38–126)
Anion gap: 10 (ref 5–15)
BUN: 17 mg/dL (ref 8–23)
CO2: 26 mmol/L (ref 22–32)
Calcium: 8.9 mg/dL (ref 8.9–10.3)
Chloride: 91 mmol/L — ABNORMAL LOW (ref 98–111)
Creatinine, Ser: 0.73 mg/dL (ref 0.61–1.24)
GFR, Estimated: >60 mL/min (ref >60)
Glucose, Bld: 187 mg/dL — ABNORMAL HIGH (ref 70–99)
Potassium: 3.9 mmol/L (ref 3.5–5.1)
Sodium: 127 mmol/L — ABNORMAL LOW (ref 135–145)
Total Bilirubin: 0.8 mg/dL (ref 0.3–1.2)
Total Protein: 7.1 g/dL (ref 6.5–8.1)

## 2020-12-01 MED ORDER — FAMOTIDINE 20 MG IN NS 100 ML IVPB
20.0000 mg | Freq: Once | INTRAVENOUS | Status: AC
  Administered 2020-12-01: 20 mg via INTRAVENOUS
  Filled 2020-12-01: qty 20
  Filled 2020-12-01: qty 100

## 2020-12-01 MED ORDER — SODIUM CHLORIDE 0.9% FLUSH
10.0000 mL | Freq: Once | INTRAVENOUS | Status: AC
  Administered 2020-12-01: 10 mL via INTRAVENOUS
  Filled 2020-12-01: qty 10

## 2020-12-01 MED ORDER — SODIUM CHLORIDE 0.9 % IV SOLN
160.0000 mg | Freq: Once | INTRAVENOUS | Status: AC
  Administered 2020-12-01: 160 mg via INTRAVENOUS
  Filled 2020-12-01: qty 16

## 2020-12-01 MED ORDER — HEPARIN SOD (PORK) LOCK FLUSH 100 UNIT/ML IV SOLN
500.0000 [IU] | Freq: Once | INTRAVENOUS | Status: AC | PRN
  Administered 2020-12-01: 500 [IU]
  Filled 2020-12-01: qty 5

## 2020-12-01 MED ORDER — HEPARIN SOD (PORK) LOCK FLUSH 100 UNIT/ML IV SOLN
INTRAVENOUS | Status: AC
  Filled 2020-12-01: qty 5

## 2020-12-01 MED ORDER — PALONOSETRON HCL INJECTION 0.25 MG/5ML
0.2500 mg | Freq: Once | INTRAVENOUS | Status: AC
  Administered 2020-12-01: 0.25 mg via INTRAVENOUS
  Filled 2020-12-01: qty 5

## 2020-12-01 MED ORDER — DIPHENHYDRAMINE HCL 50 MG/ML IJ SOLN
25.0000 mg | Freq: Once | INTRAMUSCULAR | Status: AC
  Administered 2020-12-01: 25 mg via INTRAVENOUS
  Filled 2020-12-01: qty 1

## 2020-12-01 MED ORDER — SODIUM CHLORIDE 0.9 % IV SOLN
10.0000 mg | Freq: Once | INTRAVENOUS | Status: AC
  Administered 2020-12-01: 10 mg via INTRAVENOUS
  Filled 2020-12-01: qty 10

## 2020-12-01 MED ORDER — SODIUM CHLORIDE 0.9 % IV SOLN
Freq: Once | INTRAVENOUS | Status: AC
Start: 2020-12-01 — End: 2020-12-01
  Filled 2020-12-01: qty 250

## 2020-12-01 MED ORDER — SODIUM CHLORIDE 0.9 % IV SOLN
45.0000 mg/m2 | Freq: Once | INTRAVENOUS | Status: AC
  Administered 2020-12-01: 78 mg via INTRAVENOUS
  Filled 2020-12-01: qty 13

## 2020-12-01 NOTE — Patient Instructions (Signed)
[facility]  Discharge Instructions: Thank you for choosing [facility] to provide your oncology and hematology care.  If you have a lab appointment with the Cancer Center, please go directly to the Cancer Center and check in at the registration area.  Wear comfortable clothing and clothing appropriate for easy access to any Portacath or PICC line.   We strive to give you quality time with your provider. You may need to reschedule your appointment if you arrive late (15 or more minutes).  Arriving late affects you and other patients whose appointments are after yours.  Also, if you miss three or more appointments without notifying the office, you may be dismissed from the clinic at the provider's discretion.      For prescription refill requests, have your pharmacy contact our office and allow 72 hours for refills to be completed.    Today you received the following chemotherapy and/or immunotherapy agents Taxol & Carboplatin      To help prevent nausea and vomiting after your treatment, we encourage you to take your nausea medication as directed.  BELOW ARE SYMPTOMS THAT SHOULD BE REPORTED IMMEDIATELY: *FEVER GREATER THAN 100.4 F (38 C) OR HIGHER *CHILLS OR SWEATING *NAUSEA AND VOMITING THAT IS NOT CONTROLLED WITH YOUR NAUSEA MEDICATION *UNUSUAL SHORTNESS OF BREATH *UNUSUAL BRUISING OR BLEEDING *URINARY PROBLEMS (pain or burning when urinating, or frequent urination) *BOWEL PROBLEMS (unusual diarrhea, constipation, pain near the anus) TENDERNESS IN MOUTH AND THROAT WITH OR WITHOUT PRESENCE OF ULCERS (sore throat, sores in mouth, or a toothache) UNUSUAL RASH, SWELLING OR PAIN  UNUSUAL VAGINAL DISCHARGE OR ITCHING   Items with * indicate a potential emergency and should be followed up as soon as possible or go to the Emergency Department if any problems should occur.  Please show the CHEMOTHERAPY ALERT CARD or IMMUNOTHERAPY ALERT CARD at  check-in to the Emergency Department and triage nurse.  Should you have questions after your visit or need to cancel or reschedule your appointment, please contact [facility]  [phone] and follow the prompts.  Office hours are 8:00 a.m. to 4:30 p.m. Monday - Friday. Please note that voicemails left after 4:00 p.m. may not be returned until the following business day.  We are closed weekends and major holidays. You have access to a nurse at all times for urgent questions. Please call the main number to the clinic [phone] and follow the prompts.  For any non-urgent questions, you may also contact your provider using MyChart. We now offer e-Visits for anyone 18 and older to request care online for non-urgent symptoms. For details visit [email or web address].   Also download the MyChart app! Go to the app store, search "MyChart", open the app, select [facility], and log in with your MyChart username and password.  Due to Covid, a mask is required upon entering the hospital/clinic. If you do not have a mask, one will be given to you upon arrival. For doctor visits, patients may have 1 support person aged 18 or older with them. For treatment visits, patients cannot have anyone with them due to current Covid guidelines and our immunocompromised population.  

## 2020-12-01 NOTE — Progress Notes (Signed)
Left arm and back still achy but not as bad.

## 2020-12-02 ENCOUNTER — Ambulatory Visit
Admission: RE | Admit: 2020-12-02 | Discharge: 2020-12-02 | Disposition: A | Discharge status: Home / Self Care | Payer: Medicare HMO | Source: Ambulatory Visit | Attending: Radiation Oncology | Admitting: Radiation Oncology

## 2020-12-02 DIAGNOSIS — Z510 Encounter for antineoplastic radiation therapy: Secondary | ICD-10-CM | POA: Diagnosis not present

## 2020-12-05 ENCOUNTER — Telehealth: Payer: Self-pay | Admitting: Cardiovascular Disease

## 2020-12-05 ENCOUNTER — Ambulatory Visit
Admission: RE | Admit: 2020-12-05 | Discharge: 2020-12-05 | Disposition: A | Discharge status: Home / Self Care | Payer: Medicare HMO | Source: Ambulatory Visit | Attending: Radiation Oncology | Admitting: Radiation Oncology

## 2020-12-05 DIAGNOSIS — Z510 Encounter for antineoplastic radiation therapy: Secondary | ICD-10-CM | POA: Diagnosis not present

## 2020-12-05 MED ORDER — ISOSORBIDE MONONITRATE ER 30 MG PO TB24: 30.0000 mg | ORAL_TABLET | Freq: Every day | ORAL | 2 refills | Status: DC

## 2020-12-05 NOTE — Telephone Encounter (Signed)
*  STAT* If patient is at the pharmacy, call can be transferred to refill team.   1. Which medications need to be refilled? (please list name of each medication and dose if known)  Isosorbide mononitrate (IMDUR) 30 MG 1 tablet daily   2. Which pharmacy/location (including street and city if local pharmacy) is medication to be sent to? [facility] in [place]   3. Do they need a 30 day or 90 day supply? 90 day

## 2020-12-05 NOTE — Telephone Encounter (Signed)
Requested Prescriptions   Signed Prescriptions Disp Refills   isosorbide mononitrate (IMDUR) 30 MG 24 hr tablet 90 tablet 2    Sig: Take 1 tablet (30 mg total) by mouth daily.    Authorizing Provider: [name]    Ordering User: Raelene Bott, Adamarys Shall L

## 2020-12-06 ENCOUNTER — Ambulatory Visit
Admission: RE | Admit: 2020-12-06 | Discharge: 2020-12-06 | Disposition: A | Discharge status: Home / Self Care | Payer: Medicare HMO | Source: Ambulatory Visit | Attending: Radiation Oncology | Admitting: Radiation Oncology

## 2020-12-06 DIAGNOSIS — Z510 Encounter for antineoplastic radiation therapy: Secondary | ICD-10-CM | POA: Diagnosis not present

## 2020-12-07 ENCOUNTER — Encounter: Payer: Self-pay | Admitting: Oncology

## 2020-12-07 ENCOUNTER — Ambulatory Visit
Admission: RE | Admit: 2020-12-07 | Discharge: 2020-12-07 | Disposition: A | Discharge status: Home / Self Care | Payer: Medicare HMO | Source: Ambulatory Visit | Attending: Radiation Oncology | Admitting: Radiation Oncology

## 2020-12-07 DIAGNOSIS — Z510 Encounter for antineoplastic radiation therapy: Secondary | ICD-10-CM | POA: Diagnosis not present

## 2020-12-08 ENCOUNTER — Encounter: Payer: Medicare HMO | Admitting: Hospice and Palliative Medicine

## 2020-12-08 ENCOUNTER — Inpatient Hospital Stay: Payer: Medicare HMO

## 2020-12-08 ENCOUNTER — Inpatient Hospital Stay (HOSPITAL_BASED_OUTPATIENT_CLINIC_OR_DEPARTMENT_OTHER): Payer: Medicare HMO | Admitting: Hospice and Palliative Medicine

## 2020-12-08 ENCOUNTER — Ambulatory Visit: Payer: Medicare HMO | Admitting: Oncology

## 2020-12-08 ENCOUNTER — Other Ambulatory Visit: Payer: Medicare HMO

## 2020-12-08 ENCOUNTER — Ambulatory Visit: Payer: Medicare HMO

## 2020-12-08 ENCOUNTER — Telehealth: Payer: Medicare HMO | Admitting: Hospice and Palliative Medicine

## 2020-12-08 ENCOUNTER — Telehealth: Payer: Self-pay | Admitting: *Deleted

## 2020-12-08 DIAGNOSIS — Z515 Encounter for palliative care: Secondary | ICD-10-CM

## 2020-12-08 MED ORDER — MORPHINE SULFATE ER 15 MG PO TBCR: 30.0000 mg | EXTENDED_RELEASE_TABLET | Freq: Two times a day (BID) | ORAL | 0 refills | Status: DC

## 2020-12-08 NOTE — Telephone Encounter (Signed)
Spoke with patient/wife by phone.

## 2020-12-08 NOTE — Progress Notes (Signed)
Nutrition Follow-up:    Patient with stage IV squamous cell lung cancer.  Patient has completed radiation and chemotherapy.    Spoke with wife via phone for nutrition follow-up.  Wife reports that patient's appetite has improved.  Says that he is eating better, snacking more.  Had late breakfast this am of link sausage, 2 scrambled eggs, boost and chocolate milk.  Lunch ate some ice cream as had late breakfast.  Planning fish, fries and slaw for dinner.  Likes the glucerna shake and boost shake.  Has been drinking 2-3 per day.    Anthropometrics:   Weight 126 lb 1.6 oz ib 8/4  127 lb on 7/28 128 lb on 7/21 133 lb on 7/1 141 lb on 6/10 146 lb on 5/31 163 lb on 5/20   NUTRITION DIAGNOSIS: Inadequate oral intake improving   INTERVENTION:  Continue high calorie, high protein foods and oral nutrition supplements    MONITORING, EVALUATION, GOAL: weight trends, intake   NEXT VISIT: as needed  Anishka Bushard B. Zenia Resides, [place], [place] Registered Dietitian [phone] (mobile)

## 2020-12-08 NOTE — Telephone Encounter (Signed)
Patient was expecting a call today from [facility] NP. Wife would like a call back if the appointment has been canceled

## 2020-12-08 NOTE — Addendum Note (Signed)
Addended by: Irean Hong on: 12/08/2020 03:28 PM   Modules accepted: Orders, Level of Service

## 2020-12-08 NOTE — Progress Notes (Addendum)
Virtual Visit via Telephone Note  I connected with Donald Lowery on 12/08/20 at  1:45 PM EDT by telephone and verified that I am speaking with the correct person using two identifiers.  Location: Patient: Home Provider: Clinic   I discussed the limitations, risks, security and privacy concerns of performing an evaluation and management service by telephone and the availability of in person appointments. I also discussed with the patient that there may be a patient responsible charge related to this service. The patient expressed understanding and agreed to proceed.   History of Present Illness: Donald Lowery is a 70 y.o. 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  Patient has had significant left shoulder pain.  PET/CT demonstrated a 7.1 cm left apical mass, which abuts the mediastinum encasing the left subclavian and vertebral arteries and is felt probably to have nerve impingement leading to referred pain.  He also has vocal cord paralysis.  Patient was referred to palliative care to help address goals to manage ongoing symptoms.   Observations/Objective: Patient saw Dr. Dossie Arbour on 11/29/2020 with dose adjustment of patient's gabapentin.  Spoke with patient and wife by phone.  Patient reports that his pain has improved with MS Contin/oxycodone.  Patient is trying to take less of the oxycodone and sometimes is only taking a single MS Contin vs two (15mg  vs 30mg  at a time).  Refill of MS Contin was requested.  Wife reports that Dr. Dossie Arbour had discussed option of an epidural injection.  She is awaiting notification regarding insurance status.  Assessment and Plan: Neoplasm related pain -continue MS Contin/oxycodone.  We will refill MS Contin today #60.  Patient is being followed by Dr. Dossie Arbour  Follow Up Instructions: Follow-up MyChart visit 1 month   I discussed the assessment and treatment plan with the  patient. The patient was provided an opportunity to ask questions and all were answered. The patient agreed with the plan and demonstrated an understanding of the instructions.   The patient was advised to call back or seek an in-person evaluation if the symptoms worsen or if the condition fails to improve as anticipated.  I provided 10 minutes of non-face-to-face time during this encounter.   Irean Hong, NP

## 2020-12-09 ENCOUNTER — Encounter: Payer: Self-pay | Admitting: Oncology

## 2020-12-09 ENCOUNTER — Encounter: Payer: Self-pay | Admitting: Nurse Practitioner

## 2020-12-10 NOTE — Progress Notes (Signed)
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  INTERVAL HISTORY: Patient returns to clinic today for further evaluation, discussion of his PET scan results, and additional treatment planning.  He states his left shoulder pain is worse in his current narcotic regimen is no longer working.  He is yet to have an appointment in pain clinic secondary to insurance purposes.  He otherwise feels well.  He has no neurologic complaints.  He denies any recent fevers or illnesses.  He has a good appetite and denies weight loss.  He denies any chest pain, shortness of breath, cough, or hemoptysis.  He denies any abdominal pain.  He has no nausea, vomiting, constipation, or diarrhea.  He has no urinary complaints.  Patient offers no further specific complaints today.  REVIEW OF SYSTEMS:   Review of Systems  Constitutional: Negative.  Negative for fever, malaise/fatigue and weight loss.  Respiratory: Negative.  Negative for cough, hemoptysis and shortness of breath.   Cardiovascular: Negative.  Negative for chest pain and leg swelling.  Gastrointestinal:  Negative for abdominal pain.  Genitourinary: Negative.  Negative for dysuria.  Musculoskeletal:  Positive for joint pain.  Skin: Negative.  Negative for rash.  Neurological: Negative.  Negative for dizziness, focal weakness, weakness and headaches.   As per HPI. Otherwise, a complete review of systems is negative.  PAST MEDICAL HISTORY: Past Medical History:  Diagnosis Date   Aortic atherosclerosis ([place])    [place] hernia 09/21/2020   fatty   Coronary artery disease    a. 04/2018 NSTEMI/Cath: LM nl,  LAD 50p, 42m/d diffuse-small caliber, LCX heavily Ca2+ sev prox/mid dzs, RCA 90 diff distal dzs into RPL and RPDA. EF 50-55%-->Med Rx.   Hepatic steatosis    History of 2019 novel coronavirus disease (COVID-19) 10/12/2020   History of echocardiogram    a. 04/2018 Echo: EF 55-60%, no rwma, mild MR, mildly dil LA. Nl RV fxn.   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 Quit 04/2018.    PAST SURGICAL HISTORY: Past Surgical History:  Procedure Laterality Date   BRAIN SURGERY     CARDIAC CATHETERIZATION     IR IMAGING GUIDED PORT INSERTION  10/28/2020   LEFT HEART CATH AND CORONARY ANGIOGRAPHY N/A 05/16/2018   Procedure: LEFT HEART CATH AND CORONARY ANGIOGRAPHY poss PCI;  Surgeon: [name], MD;  Location: [facility];  Service: Cardiovascular;  Laterality: N/A;   VIDEO BRONCHOSCOPY WITH ENDOBRONCHIAL NAVIGATION N/A 10/24/2020   Procedure: ROBOTIC ASSISTED VIDEO BRONCHOSCOPY WITH ENDOBRONCHIAL NAVIGATION;  Surgeon: Tyler Pita, MD;  Location: [facility];  Service: Pulmonary;  Laterality: N/A;    FAMILY HISTORY: Family History  Problem Relation Age of Onset   Heart Problems Mother    Heart attack Father    Diabetes Brother    Heart Problems Brother     ADVANCED DIRECTIVES (Y/N):  N  HEALTH MAINTENANCE:  Social History   Tobacco Use   Smoking status: Former    Packs/day: 1.00    Years: 45.00    Pack years: 45.00    Types: Cigarettes    Quit date: 05/15/2018    Years since quitting: 2.5   Smokeless tobacco: Never  Vaping Use   Vaping Use: Never used  Substance Use Topics   Alcohol use: Never   Drug use: Never     Colonoscopy:  PAP:  Bone density:  Lipid panel:  No Known  Allergies  Current Outpatient Medications  Medication Sig Dispense Refill   aspirin EC 81 MG EC tablet Take 1 tablet (81 mg total) by mouth daily. 30 tablet 0   atorvastatin (LIPITOR) 80 MG tablet Take 1 tablet (80 mg total) by mouth daily at 6 PM. 90 tablet 0   ezetimibe (ZETIA) 10 MG tablet Take 1 tablet (10 mg total) by mouth daily. 90 tablet 0   gabapentin (NEURONTIN) 300 MG capsule Take 1 capsule (300 mg total) by mouth 3 (three) times daily. 90 capsule 0   icosapent Ethyl (VASCEPA) 1 g capsule Take 2 capsules (2 g total) by mouth 2 (two) times daily. 120 capsule 6   isosorbide mononitrate (IMDUR) 30 MG 24 hr tablet Take 1 tablet (30 mg total) by mouth daily. 90 tablet 2   lidocaine-prilocaine (EMLA) cream Apply to affected area once 30 g 3   losartan (COZAAR) 25 MG tablet Take 1 tablet (25 mg total) by mouth daily. 90 tablet 1   Menthol-Methyl Salicylate (ICY HOT) 38-25 % STCK Apply 1 application topically daily as needed (shoulder pain).     metFORMIN (GLUCOPHAGE) 500 MG tablet Take 500 mg by mouth every morning.     morphine (MS CONTIN) 15 MG 12 hr tablet Take 2 tablets (30 mg total) by mouth every 12 (twelve) hours. 60 tablet 0   nitroGLYCERIN (NITROSTAT) 0.4 MG SL tablet Place 1 tablet (0.4 mg total) under the tongue every 5 (five) minutes as needed for chest pain. 25 tablet 3   ondansetron (ZOFRAN) 8 MG tablet Take 1 tablet (8 mg total) by mouth 2 (two) times daily as needed for refractory nausea / vomiting. 60 tablet 1   Oxycodone HCl 10 MG TABS Take 1 tablet (10 mg total) by mouth every 4 (four) hours as needed. 60 tablet 0   prochlorperazine (COMPAZINE) 10 MG tablet Take 1 tablet (10 mg total) by mouth every 6 (six) hours as needed (Nausea or vomiting). 60 tablet 1   sucralfate (CARAFATE) 1 g tablet Take 0.5 tablets (0.5 g total) by mouth 3 (three) times daily before meals. 60 tablet 5   vitamin B-12 (CYANOCOBALAMIN) 1000 MCG tablet Take 1,000 mcg by mouth daily.     dicyclomine  (BENTYL) 10 MG capsule Take 1 capsule (10 mg total) by mouth 3 (three) times daily as needed for up to 14 days for spasms. 20 capsule 0   gabapentin (NEURONTIN) 100 MG capsule Take 2 capsules (200 mg total) by mouth at bedtime for 7 days, THEN 3 capsules (300 mg total) at bedtime for 7 days, THEN 4 capsules (400 mg total) at bedtime for 16 days. Follow written titration schedule. (Patient not taking: Reported on 12/14/2020) 99 capsule 0   naloxone (NARCAN) nasal spray 4 mg/0.1 mL SPRAY 1 SPRAY INTO ONE NOSTRIL AS DIRECTED FOR OPIOID OVERDOSE (TURN PERSON ON SIDE AFTER DOSE. IF NO RESPONSE IN 2-3 MINUTES OR PERSON RESPONDS BUT RELAPSES, REPEAT USING A NEW SPRAY DEVICE  AND SPRAY INTO THE OTHER NOSTRIL. CALL 911 AFTER USE.) * EMERGENCY USE ONLY * (Patient not taking: Reported on 12/14/2020) 1 each 0   No current facility-administered medications for this visit.    OBJECTIVE: Vitals:   12/14/20 0925  BP: 140/80  Pulse: (!) 105  Resp: 18  Temp: (!) 97.3 F (36.3 C)     Body mass index is 20.87 kg/m.    ECOG FS:1 - Symptomatic but completely ambulatory  General: Well-developed, well-nourished, no acute distress. Eyes: Pink conjunctiva, anicteric sclera. HEENT: Normocephalic, moist mucous membranes. Lungs: No audible wheezing or coughing. Heart: Regular rate and rhythm. Abdomen: Soft, nontender, no obvious distention. Musculoskeletal: No edema, cyanosis, or clubbing. Neuro: Alert, answering all questions appropriately. Cranial nerves grossly intact. Skin: No rashes or petechiae noted. Psych: Normal affect.,  LAB RESULTS:  Lab Results  Component Value Date   NA 127 (L) 12/14/2020   K 4.1 12/14/2020   CL 91 (L) 12/14/2020   CO2 29 12/14/2020   GLUCOSE 214 (H) 12/14/2020   BUN 12 12/14/2020   CREATININE 0.72 12/14/2020   CALCIUM 8.5 (L) 12/14/2020   PROT 6.5 12/14/2020   ALBUMIN 2.8 (L) 12/14/2020   AST 18 12/14/2020   ALT 17 12/14/2020   ALKPHOS 97 12/14/2020   BILITOT 0.5  12/14/2020   GFRNONAA >60 12/14/2020   GFRAA >60 07/14/2019    Lab Results  Component Value Date   WBC 7.5 12/14/2020   NEUTROABS 5.6 12/14/2020   HGB 8.4 (L) 12/14/2020   HCT 26.1 (L) 12/14/2020   MCV 89.4 12/14/2020   PLT 310 12/14/2020     STUDIES: DG Cervical Spine With Flex & Extend  Result Date: 12/01/2020 CLINICAL DATA:  Neck pain. EXAM: CERVICAL SPINE COMPLETE WITH FLEXION AND EXTENSION VIEWS COMPARISON:  None. FINDINGS: There is no acute fracture or subluxation of the cervical spine. There is straightening of normal cervical lordosis which may be positional or due to muscle spasm. There is apparent bony fusion C1-C2. grade 1 C4-C5 retrolisthesis. The visualized posterior elements appear intact. Multilevel facet arthropathy. The soft tissues are unremarkable. Bilateral carotid bulb calcified plaques. Partially visualized right-sided Port-A-Cath. IMPRESSION: 1. No acute fracture or subluxation of the cervical spine. 2. Multilevel facet arthropathy. 3. Grade 1 C4-C5 retrolisthesis. Electronically Signed   By: Anner Crete M.D.   On: 12/01/2020 01:18   NM PET Image Restage (PS) Skull Base to Thigh  Result Date: 12/13/2020 CLINICAL DATA:  Follow-up treatment strategy for non-small-cell lung cancer. EXAM: NUCLEAR MEDICINE PET SKULL BASE TO THIGH TECHNIQUE: 6.7 mCi F-18 FDG was injected intravenously. Full-ring PET imaging was performed from the skull base to thigh after the radiotracer. CT data was obtained and used for attenuation correction and anatomic localization. Fasting blood glucose: 130 mg/dl COMPARISON:  PET-CT dated October 04, 2020 FINDINGS: Mediastinal blood pool activity: SUV max 2.3 Liver activity: SUV max 2.8 NECK: No hypermetabolic cervical lymph nodes. 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 Incidental CT findings: none CHEST: Left apical mass measures 5.8 x 5.2 cm, previously 7.1 x 6.0 cm and demonstrates decreased  hypermetabolic activity with an SUV max of 5.3, previously 16.0. Left apical mass abuts the aortic arch and encases the left subclavian and vertebral arteries. Bilateral pulmonary nodules are increased in size compared to prior exam. Reference nodule of the right upper lobe measures 10 mm on image 80, previously 4 mm, within SUV max of 2.4, previously 1.0. Incidental CT findings:  New small left pleural effusion. Paraseptal emphysema. Bibasilar atelectasis. ABDOMEN/PELVIS: No abnormal hypermetabolic activity within the liver, pancreas, adrenal glands, or spleen. No hypermetabolic lymph nodes in the abdomen or pelvis. Incidental CT findings: Cholecystectomy clips. Diverticula of the descending colon. Mild wall thickening of the urinary bladder, similar to prior. SKELETON: New hypermetabolic lytic lesion of the left iliac crest with an SUV max of 8.7. IMPRESSION: Decreased size and hypermetabolic activity of left apical mass. Bilateral solid pulmonary nodules are stable to increased in size compared to prior. New hypermetabolic lytic lesion of the left iliac crest, compatible with osseous metastatic disease. Aortic Atherosclerosis (ICD10-I70.0) and Emphysema (ICD10-J43.9). Electronically Signed   By: Yetta Glassman M.D.   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  PLAN:    1. 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  PET scan results from October 04, 2020 reviewed independently with hypermetabolic bilateral pulmonary nodules without mediastinal or hilar lymphadenopathy.  Although imaging suggests stage IV disease, will treat patient as a stage IIIa.  MRI of the brain on October 08, 2020 was negative for disease.  Repeat PET scan results from December 13, 2020 reviewed independently and reported as above with improvement of hypermetabolic left apical mass as well as bilateral pulmonary nodules, patient noted to  have a new hypermetabolic lytic lesion in his left iliac crest.  Radiation oncology has planned 3 additional weeks of XRT, therefore patient will return to clinic in 1 week to resume weekly carboplatinum and Taxol throughout the duration of his XRT.  Given his PD-L1 is 5%, will likely transition to [facility] once concurrent treatment has completed.  Return to clinic in 1 week for consideration of cycle 6 of carboplatin and Taxol.   2.  Shoulder pain: Patient reports this is worse today.  Will increase MS Contin to every 8 hours and continue current dose of oxycodone and gabapentin.  Pain clinic evaluation is pending.  Appreciate palliative care input. 3.  Weight loss: Weight has stabilized.  Appreciate dietary input. 4.  Hyponatremia: Chronic and unchanged.  Patient sodium is 127. 5.  Leukocytosis: Resolved. 6.  Anemia: Chronic and unchanged.  Patient's hemoglobin is 8.4.  Monitor.  Patient expressed understanding and was in agreement with this plan. He also understands that He can call clinic at any time with any questions, concerns, or complaints.   Cancer Staging Non-small cell cancer of left lung [facility]) Staging form: Lung, AJCC 8th Edition - Clinical stage from 10/28/2020: Stage IVA (cT4, cN0, pM1a) - Signed by Lloyd Huger, MD on 10/28/2020  Lloyd Huger, MD   12/14/2020 3:46 PM

## 2020-12-12 ENCOUNTER — Other Ambulatory Visit: Payer: Self-pay

## 2020-12-12 ENCOUNTER — Ambulatory Visit
Admission: RE | Admit: 2020-12-12 | Discharge: 2020-12-12 | Disposition: A | Discharge status: Home / Self Care | Payer: Medicare HMO | Source: Ambulatory Visit | Attending: Oncology | Admitting: Oncology

## 2020-12-12 DIAGNOSIS — R918 Other nonspecific abnormal finding of lung field: Secondary | ICD-10-CM | POA: Diagnosis not present

## 2020-12-12 DIAGNOSIS — C3492 Malignant neoplasm of unspecified part of left bronchus or lung: Secondary | ICD-10-CM | POA: Diagnosis present

## 2020-12-12 DIAGNOSIS — J439 Emphysema, unspecified: Secondary | ICD-10-CM | POA: Diagnosis not present

## 2020-12-12 DIAGNOSIS — I700 Atherosclerosis of aorta: Secondary | ICD-10-CM | POA: Insufficient documentation

## 2020-12-12 LAB — GLUCOSE, CAPILLARY: Glucose-Capillary: 130 mg/dL — ABNORMAL HIGH (ref 70–99)

## 2020-12-12 MED ORDER — FLUDEOXYGLUCOSE F - 18 (FDG) INJECTION
6.7000 | Freq: Once | INTRAVENOUS | Status: AC | PRN
  Administered 2020-12-12: 6.7 via INTRAVENOUS

## 2020-12-14 ENCOUNTER — Inpatient Hospital Stay: Payer: Medicare HMO

## 2020-12-14 ENCOUNTER — Encounter: Payer: Self-pay | Admitting: Oncology

## 2020-12-14 ENCOUNTER — Inpatient Hospital Stay (HOSPITAL_BASED_OUTPATIENT_CLINIC_OR_DEPARTMENT_OTHER): Payer: Medicare HMO | Admitting: Oncology

## 2020-12-14 ENCOUNTER — Ambulatory Visit
Admission: RE | Admit: 2020-12-14 | Discharge: 2020-12-14 | Disposition: A | Discharge status: Home / Self Care | Payer: Medicare HMO | Source: Ambulatory Visit | Attending: Radiation Oncology | Admitting: Radiation Oncology

## 2020-12-14 VITALS — BP 140/80 | HR 105 | Temp 97.3°F | Resp 18 | Wt 125.4 lb

## 2020-12-14 DIAGNOSIS — C3492 Malignant neoplasm of unspecified part of left bronchus or lung: Secondary | ICD-10-CM

## 2020-12-14 DIAGNOSIS — Z510 Encounter for antineoplastic radiation therapy: Secondary | ICD-10-CM | POA: Diagnosis not present

## 2020-12-14 DIAGNOSIS — Z5111 Encounter for antineoplastic chemotherapy: Secondary | ICD-10-CM | POA: Diagnosis not present

## 2020-12-14 LAB — CBC WITH DIFFERENTIAL/PLATELET
Abs Immature Granulocytes: 0.36 10*3/uL — ABNORMAL HIGH (ref 0.00–0.07)
Basophils Absolute: 0 10*3/uL (ref 0.0–0.1)
Basophils Relative: 1 %
Eosinophils Absolute: 0 10*3/uL (ref 0.0–0.5)
Eosinophils Relative: 0 %
HCT: 26.1 % — ABNORMAL LOW (ref 39.0–52.0)
Hemoglobin: 8.4 g/dL — ABNORMAL LOW (ref 13.0–17.0)
Immature Granulocytes: 5 %
Lymphocytes Relative: 10 %
Lymphs Abs: 0.8 10*3/uL (ref 0.7–4.0)
MCH: 28.8 pg (ref 26.0–34.0)
MCHC: 32.2 g/dL (ref 30.0–36.0)
MCV: 89.4 fL (ref 80.0–100.0)
Monocytes Absolute: 0.7 10*3/uL (ref 0.1–1.0)
Monocytes Relative: 10 %
Neutro Abs: 5.6 10*3/uL (ref 1.7–7.7)
Neutrophils Relative %: 74 %
Platelets: 310 10*3/uL (ref 150–400)
RBC: 2.92 MIL/uL — ABNORMAL LOW (ref 4.22–5.81)
RDW: 17.2 % — ABNORMAL HIGH (ref 11.5–15.5)
WBC: 7.5 10*3/uL (ref 4.0–10.5)
nRBC: 0 % (ref 0.0–0.2)

## 2020-12-14 LAB — COMPREHENSIVE METABOLIC PANEL
ALT: 17 U/L (ref 0–44)
AST: 18 U/L (ref 15–41)
Albumin: 2.8 g/dL — ABNORMAL LOW (ref 3.5–5.0)
Alkaline Phosphatase: 97 U/L (ref 38–126)
Anion gap: 7 (ref 5–15)
BUN: 12 mg/dL (ref 8–23)
CO2: 29 mmol/L (ref 22–32)
Calcium: 8.5 mg/dL — ABNORMAL LOW (ref 8.9–10.3)
Chloride: 91 mmol/L — ABNORMAL LOW (ref 98–111)
Creatinine, Ser: 0.72 mg/dL (ref 0.61–1.24)
GFR, Estimated: >60 mL/min (ref >60)
Glucose, Bld: 214 mg/dL — ABNORMAL HIGH (ref 70–99)
Potassium: 4.1 mmol/L (ref 3.5–5.1)
Sodium: 127 mmol/L — ABNORMAL LOW (ref 135–145)
Total Bilirubin: 0.5 mg/dL (ref 0.3–1.2)
Total Protein: 6.5 g/dL (ref 6.5–8.1)

## 2020-12-14 LAB — SAMPLE TO BLOOD BANK

## 2020-12-14 MED ORDER — OXYCODONE HCL 10 MG PO TABS: 10.0000 mg | ORAL_TABLET | ORAL | 0 refills | Status: DC | PRN

## 2020-12-14 NOTE — Progress Notes (Signed)
Patient still having a lot of pain in left arm and back.  The pain medication regimen is not helping at all, 10/10 pain scale constant.

## 2020-12-19 DIAGNOSIS — Z510 Encounter for antineoplastic radiation therapy: Secondary | ICD-10-CM | POA: Diagnosis not present

## 2020-12-20 ENCOUNTER — Ambulatory Visit: Admission: RE | Admit: 2020-12-20 | Payer: Medicare HMO | Source: Ambulatory Visit

## 2020-12-20 DIAGNOSIS — Z510 Encounter for antineoplastic radiation therapy: Secondary | ICD-10-CM | POA: Diagnosis not present

## 2020-12-21 ENCOUNTER — Ambulatory Visit
Admission: RE | Admit: 2020-12-21 | Discharge: 2020-12-21 | Disposition: A | Discharge status: Home / Self Care | Payer: Medicare HMO | Source: Ambulatory Visit | Attending: Radiation Oncology | Admitting: Radiation Oncology

## 2020-12-21 ENCOUNTER — Other Ambulatory Visit: Payer: Self-pay

## 2020-12-21 ENCOUNTER — Inpatient Hospital Stay: Payer: Medicare HMO

## 2020-12-21 ENCOUNTER — Encounter: Payer: Self-pay | Admitting: Oncology

## 2020-12-21 ENCOUNTER — Inpatient Hospital Stay (HOSPITAL_BASED_OUTPATIENT_CLINIC_OR_DEPARTMENT_OTHER): Payer: Medicare HMO | Admitting: Hospice and Palliative Medicine

## 2020-12-21 ENCOUNTER — Inpatient Hospital Stay (HOSPITAL_BASED_OUTPATIENT_CLINIC_OR_DEPARTMENT_OTHER): Payer: Medicare HMO | Admitting: Oncology

## 2020-12-21 VITALS — BP 111/66 | HR 97 | Temp 97.6°F | Resp 18 | Wt 123.2 lb

## 2020-12-21 DIAGNOSIS — C3492 Malignant neoplasm of unspecified part of left bronchus or lung: Secondary | ICD-10-CM

## 2020-12-21 DIAGNOSIS — Z515 Encounter for palliative care: Secondary | ICD-10-CM | POA: Diagnosis not present

## 2020-12-21 DIAGNOSIS — Z510 Encounter for antineoplastic radiation therapy: Secondary | ICD-10-CM | POA: Diagnosis not present

## 2020-12-21 DIAGNOSIS — Z5111 Encounter for antineoplastic chemotherapy: Secondary | ICD-10-CM | POA: Diagnosis not present

## 2020-12-21 DIAGNOSIS — G893 Neoplasm related pain (acute) (chronic): Secondary | ICD-10-CM | POA: Diagnosis not present

## 2020-12-21 DIAGNOSIS — Z95828 Presence of other vascular implants and grafts: Secondary | ICD-10-CM

## 2020-12-21 LAB — COMPREHENSIVE METABOLIC PANEL
ALT: 32 U/L (ref 0–44)
AST: 24 U/L (ref 15–41)
Albumin: 2.8 g/dL — ABNORMAL LOW (ref 3.5–5.0)
Alkaline Phosphatase: 107 U/L (ref 38–126)
Anion gap: 6 (ref 5–15)
BUN: 17 mg/dL (ref 8–23)
CO2: 30 mmol/L (ref 22–32)
Calcium: 8.9 mg/dL (ref 8.9–10.3)
Chloride: 92 mmol/L — ABNORMAL LOW (ref 98–111)
Creatinine, Ser: 0.73 mg/dL (ref 0.61–1.24)
GFR, Estimated: >60 mL/min (ref >60)
Glucose, Bld: 225 mg/dL — ABNORMAL HIGH (ref 70–99)
Potassium: 4.2 mmol/L (ref 3.5–5.1)
Sodium: 128 mmol/L — ABNORMAL LOW (ref 135–145)
Total Bilirubin: 0.7 mg/dL (ref 0.3–1.2)
Total Protein: 7 g/dL (ref 6.5–8.1)

## 2020-12-21 LAB — CBC WITH DIFFERENTIAL/PLATELET
Abs Immature Granulocytes: 0.52 10*3/uL — ABNORMAL HIGH (ref 0.00–0.07)
Basophils Absolute: 0.1 10*3/uL (ref 0.0–0.1)
Basophils Relative: 1 %
Eosinophils Absolute: 0.1 10*3/uL (ref 0.0–0.5)
Eosinophils Relative: 0 %
HCT: 25.7 % — ABNORMAL LOW (ref 39.0–52.0)
Hemoglobin: 8.4 g/dL — ABNORMAL LOW (ref 13.0–17.0)
Immature Granulocytes: 5 %
Lymphocytes Relative: 8 %
Lymphs Abs: 0.9 10*3/uL (ref 0.7–4.0)
MCH: 28.9 pg (ref 26.0–34.0)
MCHC: 32.7 g/dL (ref 30.0–36.0)
MCV: 88.3 fL (ref 80.0–100.0)
Monocytes Absolute: 0.8 10*3/uL (ref 0.1–1.0)
Monocytes Relative: 7 %
Neutro Abs: 8.8 10*3/uL — ABNORMAL HIGH (ref 1.7–7.7)
Neutrophils Relative %: 79 %
Platelets: 369 10*3/uL (ref 150–400)
RBC: 2.91 MIL/uL — ABNORMAL LOW (ref 4.22–5.81)
RDW: 17.2 % — ABNORMAL HIGH (ref 11.5–15.5)
WBC: 11.1 10*3/uL — ABNORMAL HIGH (ref 4.0–10.5)
nRBC: 0.3 % — ABNORMAL HIGH (ref 0.0–0.2)

## 2020-12-21 MED ORDER — SODIUM CHLORIDE 0.9 % IV SOLN
10.0000 mg | Freq: Once | INTRAVENOUS | Status: AC
  Administered 2020-12-21: 10 mg via INTRAVENOUS
  Filled 2020-12-21: qty 10

## 2020-12-21 MED ORDER — SODIUM CHLORIDE 0.9 % IV SOLN
45.0000 mg/m2 | Freq: Once | INTRAVENOUS | Status: AC
  Administered 2020-12-21: 78 mg via INTRAVENOUS
  Filled 2020-12-21: qty 13

## 2020-12-21 MED ORDER — OXYCODONE HCL 5 MG PO TABS
10.0000 mg | ORAL_TABLET | Freq: Once | ORAL | Status: AC
  Administered 2020-12-21: 10 mg via ORAL
  Filled 2020-12-21: qty 2

## 2020-12-21 MED ORDER — FAMOTIDINE 20 MG IN NS 100 ML IVPB
20.0000 mg | Freq: Once | INTRAVENOUS | Status: AC
Start: 2020-12-21 — End: 2020-12-21
  Administered 2020-12-21: 20 mg via INTRAVENOUS
  Filled 2020-12-21: qty 20

## 2020-12-21 MED ORDER — DIPHENHYDRAMINE HCL 50 MG/ML IJ SOLN
25.0000 mg | Freq: Once | INTRAMUSCULAR | Status: AC
  Administered 2020-12-21: 25 mg via INTRAVENOUS
  Filled 2020-12-21: qty 1

## 2020-12-21 MED ORDER — HEPARIN SOD (PORK) LOCK FLUSH 100 UNIT/ML IV SOLN
500.0000 [IU] | Freq: Once | INTRAVENOUS | Status: AC
  Filled 2020-12-21: qty 5

## 2020-12-21 MED ORDER — HEPARIN SOD (PORK) LOCK FLUSH 100 UNIT/ML IV SOLN
500.0000 [IU] | Freq: Once | INTRAVENOUS | Status: AC | PRN
  Administered 2020-12-21: 500 [IU]
  Filled 2020-12-21: qty 5

## 2020-12-21 MED ORDER — HEPARIN SOD (PORK) LOCK FLUSH 100 UNIT/ML IV SOLN
INTRAVENOUS | Status: AC
  Filled 2020-12-21: qty 5

## 2020-12-21 MED ORDER — SODIUM CHLORIDE 0.9 % IV SOLN
Freq: Once | INTRAVENOUS | Status: AC
  Filled 2020-12-21: qty 250

## 2020-12-21 MED ORDER — PALONOSETRON HCL INJECTION 0.25 MG/5ML
0.2500 mg | Freq: Once | INTRAVENOUS | Status: AC
  Administered 2020-12-21: 0.25 mg via INTRAVENOUS
  Filled 2020-12-21: qty 5

## 2020-12-21 MED ORDER — SODIUM CHLORIDE 0.9% FLUSH
10.0000 mL | Freq: Once | INTRAVENOUS | Status: AC
  Administered 2020-12-21: 10 mL via INTRAVENOUS
  Filled 2020-12-21: qty 10

## 2020-12-21 MED ORDER — SODIUM CHLORIDE 0.9 % IV SOLN
160.0000 mg | Freq: Once | INTRAVENOUS | Status: AC
  Administered 2020-12-21: 160 mg via INTRAVENOUS
  Filled 2020-12-21: qty 16

## 2020-12-21 NOTE — Progress Notes (Signed)
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  INTERVAL HISTORY: Patient returns to clinic today for further evaluation and continuation of weekly carboplatin and Taxol along with his XRT boost.  Despite adjusting his narcotics last week, patient states his shoulder pain is unchanged.  He otherwise feels well.  He has no neurologic complaints.  He denies any recent fevers or illnesses.  He has a good appetite and denies weight loss.  He denies any chest pain, shortness of breath, cough, or hemoptysis.  He denies any abdominal pain.  He has no nausea, vomiting, constipation, or diarrhea.  He has no urinary complaints.  Patient offers no further specific complaints today.  REVIEW OF SYSTEMS:   Review of Systems  Constitutional: Negative.  Negative for fever, malaise/fatigue and weight loss.  Respiratory: Negative.  Negative for cough, hemoptysis and shortness of breath.   Cardiovascular: Negative.  Negative for chest pain and leg swelling.  Gastrointestinal:  Negative for abdominal pain.  Genitourinary: Negative.  Negative for dysuria.  Musculoskeletal:  Positive for joint pain.  Skin: Negative.  Negative for rash.  Neurological: Negative.  Negative for dizziness, focal weakness, weakness and headaches.   As per HPI. Otherwise, a complete review of systems is negative.  PAST MEDICAL HISTORY: Past Medical History:  Diagnosis Date   Aortic atherosclerosis ([place])    Bochdalek hernia 09/21/2020   fatty   Coronary artery disease    a. 04/2018 NSTEMI/Cath: LM nl, LAD 50p, 72md diffuse-small caliber, LCX heavily Ca2+ sev prox/mid dzs, RCA 90 diff  distal dzs into RPL and RPDA. EF 50-55%-->Med Rx.   Hepatic steatosis    History of 2019 novel coronavirus disease (COVID-19) 10/12/2020   History of echocardiogram    a. 04/2018 Echo: EF 55-60%, no rwma, mild MR, mildly dil LA. Nl RV fxn.   Hyperlipidemia    Hypertension    NSTEMI (non-ST elevated myocardial infarction) ([facility] 05/15/2018   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[facility] 09/21/2020   a.) 7 cm LUL mass with left subclavian/proximal vertebral encasement   Paraseptal emphysema ([facility])    T2DM (type 2 diabetes mellitus) ([facility]    Tobacco abuse    a. Quit 04/2018.    PAST SURGICAL HISTORY: Past Surgical History:  Procedure Laterality Date   BRAIN SURGERY     CARDIAC CATHETERIZATION     IR IMAGING GUIDED PORT INSERTION  10/28/2020   LEFT HEART CATH AND CORONARY ANGIOGRAPHY N/A 05/16/2018   Procedure: LEFT HEART CATH AND CORONARY ANGIOGRAPHY poss PCI;  Surgeon: GMinna Merritts MD;  [facility];  Service: Cardiovascular;  Laterality: N/A;   VIDEO BRONCHOSCOPY WITH ENDOBRONCHIAL NAVIGATION N/A 10/24/2020   Procedure: ROBOTIC ASSISTED VIDEO BRONCHOSCOPY WITH ENDOBRONCHIAL NAVIGATION;  Surgeon: GTyler Pita MD;  Location: [facility];  Service: Pulmonary;  Laterality: N/A;    FAMILY HISTORY: Family History  Problem Relation Age of Onset   Heart Problems Mother    Heart attack Father    Diabetes Brother    Heart Problems Brother     ADVANCED DIRECTIVES (Y/N):  N  HEALTH MAINTENANCE: Social History   Tobacco Use   Smoking status: Former    Packs/day: 1.00  Years: 45.00    Pack years: 45.00    Types: Cigarettes    Quit date: 05/15/2018    Years since quitting: 2.6   Smokeless tobacco: Never  Vaping Use   Vaping Use: Never used  Substance Use Topics   Alcohol use: Never   Drug use: Never     Colonoscopy:  PAP:  Bone density:  Lipid panel:  No Known Allergies  Current Outpatient Medications  Medication Sig Dispense Refill   aspirin EC 81 MG EC  tablet Take 1 tablet (81 mg total) by mouth daily. 30 tablet 0   atorvastatin (LIPITOR) 80 MG tablet Take 1 tablet (80 mg total) by mouth daily at 6 PM. 90 tablet 0   dicyclomine (BENTYL) 10 MG capsule Take 1 capsule (10 mg total) by mouth 3 (three) times daily as needed for up to 14 days for spasms. 20 capsule 0   ezetimibe (ZETIA) 10 MG tablet Take 1 tablet (10 mg total) by mouth daily. 90 tablet 0   gabapentin (NEURONTIN) 100 MG capsule Take 2 capsules (200 mg total) by mouth at bedtime for 7 days, THEN 3 capsules (300 mg total) at bedtime for 7 days, THEN 4 capsules (400 mg total) at bedtime for 16 days. Follow written titration schedule. 99 capsule 0   icosapent Ethyl (VASCEPA) 1 g capsule Take 2 capsules (2 g total) by mouth 2 (two) times daily. 120 capsule 6   isosorbide mononitrate (IMDUR) 30 MG 24 hr tablet Take 1 tablet (30 mg total) by mouth daily. 90 tablet 2   lidocaine-prilocaine (EMLA) cream Apply to affected area once 30 g 3   losartan (COZAAR) 25 MG tablet Take 1 tablet (25 mg total) by mouth daily. 90 tablet 1   Menthol-Methyl Salicylate (ICY HOT) 57-84 % STCK Apply 1 application topically daily as needed (shoulder pain).     metFORMIN (GLUCOPHAGE) 500 MG tablet Take 500 mg by mouth every morning.     morphine (MS CONTIN) 15 MG 12 hr tablet Take 2 tablets (30 mg total) by mouth every 12 (twelve) hours. 60 tablet 0   nitroGLYCERIN (NITROSTAT) 0.4 MG SL tablet Place 1 tablet (0.4 mg total) under the tongue every 5 (five) minutes as needed for chest pain. 25 tablet 3   ondansetron (ZOFRAN) 8 MG tablet Take 1 tablet (8 mg total) by mouth 2 (two) times daily as needed for refractory nausea / vomiting. 60 tablet 1   Oxycodone HCl 10 MG TABS Take 1 tablet (10 mg total) by mouth every 4 (four) hours as needed. 60 tablet 0   prochlorperazine (COMPAZINE) 10 MG tablet Take 1 tablet (10 mg total) by mouth every 6 (six) hours as needed (Nausea or vomiting). 60 tablet 1   sucralfate (CARAFATE) 1  g tablet Take 0.5 tablets (0.5 g total) by mouth 3 (three) times daily before meals. 60 tablet 5   vitamin B-12 (CYANOCOBALAMIN) 1000 MCG tablet Take 1,000 mcg by mouth daily.     gabapentin (NEURONTIN) 300 MG capsule Take 1 capsule (300 mg total) by mouth 3 (three) times daily. (Patient not taking: Reported on 12/21/2020) 90 capsule 0   naloxone (NARCAN) nasal spray 4 mg/0.1 mL SPRAY 1 SPRAY INTO ONE NOSTRIL AS DIRECTED FOR OPIOID OVERDOSE (TURN PERSON ON SIDE AFTER DOSE. IF NO RESPONSE IN 2-3 MINUTES OR PERSON RESPONDS BUT RELAPSES, REPEAT USING A NEW SPRAY DEVICE AND SPRAY INTO THE OTHER NOSTRIL. CALL 911 AFTER USE.) * EMERGENCY USE ONLY * (Patient not taking: No  sig reported) 1 each 0   No current facility-administered medications for this visit.   Facility-Administered Medications Ordered in Other Visits  Medication Dose Route Frequency Provider Last Rate Last Admin   CARBOplatin (PARAPLATIN) 160 mg in sodium chloride 0.9 % 100 mL chemo infusion  160 mg Intravenous Once Lloyd Huger, MD       dexamethasone (DECADRON) 10 mg in sodium chloride 0.9 % 50 mL IVPB  10 mg Intravenous Once Lloyd Huger, MD       diphenhydrAMINE (BENADRYL) injection 25 mg  25 mg Intravenous Once Lloyd Huger, MD       famotidine (PEPCID) IVPB 20 mg in NS 100 mL IVPB  20 mg Intravenous Once Lloyd Huger, MD       heparin lock flush 100 UNIT/ML injection            heparin lock flush 100 unit/mL  500 Units Intravenous Once Lloyd Huger, MD       heparin lock flush 100 unit/mL  500 Units Intracatheter Once PRN Lloyd Huger, MD       PACLitaxel (TAXOL) 78 mg in sodium chloride 0.9 % 250 mL chemo infusion (</= 4m/m2)  45 mg/m2 (Treatment Plan Recorded) Intravenous Once FLloyd Huger MD        OBJECTIVE: Vitals:   12/21/20 0903  BP: 111/66  Pulse: 97  Resp: 18  Temp: 97.6 F (36.4 C)     Body mass index is 20.5 kg/m.    ECOG FS:1 - Symptomatic but completely  ambulatory  General: Well-developed, well-nourished, no acute distress. Eyes: Pink conjunctiva, anicteric sclera. HEENT: Normocephalic, moist mucous membranes. Lungs: No audible wheezing or coughing. Heart: Regular rate and rhythm. Abdomen: Soft, nontender, no obvious distention. Musculoskeletal: No edema, cyanosis, or clubbing. Neuro: Alert, answering all questions appropriately. Cranial nerves grossly intact. Skin: No rashes or petechiae noted. Psych: Normal affect.   LAB RESULTS:  Lab Results  Component Value Date   NA 128 (L) 12/21/2020   K 4.2 12/21/2020   CL 92 (L) 12/21/2020   CO2 30 12/21/2020   GLUCOSE 225 (H) 12/21/2020   BUN 17 12/21/2020   CREATININE 0.73 12/21/2020   CALCIUM 8.9 12/21/2020   PROT 7.0 12/21/2020   ALBUMIN 2.8 (L) 12/21/2020   AST 24 12/21/2020   ALT 32 12/21/2020   ALKPHOS 107 12/21/2020   BILITOT 0.7 12/21/2020   GFRNONAA >60 12/21/2020   GFRAA >60 07/14/2019    Lab Results  Component Value Date   WBC 11.1 (H) 12/21/2020   NEUTROABS 8.8 (H) 12/21/2020   HGB 8.4 (L) 12/21/2020   HCT 25.7 (L) 12/21/2020   MCV 88.3 12/21/2020   PLT 369 12/21/2020     STUDIES: DG Cervical Spine With Flex & Extend  Result Date: 12/01/2020 CLINICAL DATA:  Neck pain. EXAM: CERVICAL SPINE COMPLETE WITH FLEXION AND EXTENSION VIEWS COMPARISON:  None. FINDINGS: There is no acute fracture or subluxation of the cervical spine. There is straightening of normal cervical lordosis which may be positional or due to muscle spasm. There is apparent bony fusion C1-C2. grade 1 C4-C5 retrolisthesis. The visualized posterior elements appear intact. Multilevel facet arthropathy. The soft tissues are unremarkable. Bilateral carotid bulb calcified plaques. Partially visualized right-sided Port-A-Cath. IMPRESSION: 1. No acute fracture or subluxation of the cervical spine. 2. Multilevel facet arthropathy. 3. Grade 1 C4-C5 retrolisthesis. Electronically Signed   By: AAnner Crete M.D.   On: 12/01/2020 01:18   NM PET Image Restage (  PS) Skull Base to Thigh  Result Date: 12/13/2020 CLINICAL DATA:  Follow-up treatment strategy for non-small-cell lung cancer. EXAM: NUCLEAR MEDICINE PET SKULL BASE TO THIGH TECHNIQUE: 6.7 mCi F-18 FDG was injected intravenously. Full-ring PET imaging was performed from the skull base to thigh after the radiotracer. CT data was obtained and used for attenuation correction and anatomic localization. Fasting blood glucose: 130 mg/dl COMPARISON:  PET-CT dated October 04, 2020 FINDINGS: Mediastinal blood pool activity: SUV max 2.3 Liver activity: SUV max 2.8 NECK: No hypermetabolic cervical lymph nodes. 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 Incidental CT findings: none CHEST: Left apical mass measures 5.8 x 5.2 cm, previously 7.1 x 6.0 cm and demonstrates decreased hypermetabolic activity with an SUV max of 5.3, previously 16.0. Left apical mass abuts the aortic arch and encases the left subclavian and vertebral arteries. Bilateral pulmonary nodules are increased in size compared to prior exam. Reference nodule of the right upper lobe measures 10 mm on image 80, previously 4 mm, within SUV max of 2.4, previously 1.0. Incidental CT findings: New small left pleural effusion. Paraseptal emphysema. Bibasilar atelectasis. ABDOMEN/PELVIS: No abnormal hypermetabolic activity within the liver, pancreas, adrenal glands, or spleen. No hypermetabolic lymph nodes in the abdomen or pelvis. Incidental CT findings: Cholecystectomy clips. Diverticula of the descending colon. Mild wall thickening of the urinary bladder, similar to prior. SKELETON: New hypermetabolic lytic lesion of the left iliac crest with an SUV max of 8.7. IMPRESSION: Decreased size and hypermetabolic activity of left apical mass. Bilateral solid pulmonary nodules are stable to increased in size compared to prior. New hypermetabolic lytic lesion of the left  iliac crest, compatible with osseous metastatic disease. Aortic Atherosclerosis (ICD10-I70.0) and Emphysema (ICD10-J43.9). Electronically Signed   By: Yetta Glassman M.D.   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  PLAN:    1. 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  PET scan results from October 04, 2020 reviewed independently with hypermetabolic bilateral pulmonary nodules without mediastinal or hilar lymphadenopathy.  Although imaging suggests stage IV disease, will treat patient as a stage IIIa.  MRI of the brain on October 08, 2020 was negative for disease.  Repeat PET scan results from December 13, 2020 reviewed independently and reported as above with improvement of hypermetabolic left apical mass as well as bilateral pulmonary nodules, patient noted to have a new hypermetabolic lytic lesion in his left iliac crest.  Radiation oncology has planned 3 additional weeks of XRT completing on January 11, 2021.  Proceed with cycle 6 of weekly carboplatinum and Taxol.  Given his PD-L1 is 5%, will likely transition to [facility] once concurrent treatment has completed.  Return to clinic in 1 week for further evaluation and consideration of cycle 7.   2.  Shoulder pain: Unchanged.  Patient currently on MS Contin to every 8 hours and gabapentin along with PRN oxycodone. Appreciate palliative care input. 3.  Weight loss: Weight has stabilized.  Appreciate dietary input. 4.  Hyponatremia: Chronic and unchanged.  Patient's sodium is 128 today. 5.  Leukocytosis: Mild, likely reactive.  Monitor. 6.  Anemia: Chronic and unchanged.  Patient's hemoglobin is stable at 8.4 today.  Patient expressed understanding and was in agreement with this plan. He also understands that He can call clinic at any time with any questions, concerns, or complaints.   Cancer Staging Non-small  cell cancer of left lung  [facility]) Staging form: Lung, AJCC 8th Edition - Clinical stage from 10/28/2020: Stage IVA (cT4, cN0, pM1a) - Signed by Lloyd Huger, MD on 10/28/2020  Lloyd Huger, MD   12/21/2020 9:59 AM

## 2020-12-21 NOTE — Patient Instructions (Signed)
[facility]  Discharge Instructions: Thank you for choosing [facility] to provide your oncology and hematology care.  If you have a lab appointment with the [facility], please go directly to the [facility] and check in at the registration area.  Wear comfortable clothing and clothing appropriate for easy access to any Portacath or PICC line.   We strive to give you quality time with your provider. You may need to reschedule your appointment if you arrive late (15 or more minutes).  Arriving late affects you and other patients whose appointments are after yours.  Also, if you miss three or more appointments without notifying the office, you may be dismissed from the clinic at the provider's discretion.      For prescription refill requests, have your pharmacy contact our office and allow 72 hours for refills to be completed.    Today you received the following chemotherapy and/or immunotherapy agents Taxol and Carboplatin       To help prevent nausea and vomiting after your treatment, we encourage you to take your nausea medication as directed.  BELOW ARE SYMPTOMS THAT SHOULD BE REPORTED IMMEDIATELY: *FEVER GREATER THAN 100.4 F (38 C) OR HIGHER *CHILLS OR SWEATING *NAUSEA AND VOMITING THAT IS NOT CONTROLLED WITH YOUR NAUSEA MEDICATION *UNUSUAL SHORTNESS OF BREATH *UNUSUAL BRUISING OR BLEEDING *URINARY PROBLEMS (pain or burning when urinating, or frequent urination) *BOWEL PROBLEMS (unusual diarrhea, constipation, pain near the anus) TENDERNESS IN MOUTH AND THROAT WITH OR WITHOUT PRESENCE OF ULCERS (sore throat, sores in mouth, or a toothache) UNUSUAL RASH, SWELLING OR PAIN  UNUSUAL VAGINAL DISCHARGE OR ITCHING   Items with * indicate a potential emergency and should be followed up as soon as possible or go to the Emergency Department if any problems should occur.  Please show the CHEMOTHERAPY ALERT CARD or IMMUNOTHERAPY ALERT CARD  at check-in to the Emergency Department and triage nurse.  Should you have questions after your visit or need to cancel or reschedule your appointment, please contact [facility]  [phone] and follow the prompts.  Office hours are 8:00 a.m. to 4:30 p.m. Monday - Friday. Please note that voicemails left after 4:00 p.m. may not be returned until the following business day.  We are closed weekends and major holidays. You have access to a nurse at all times for urgent questions. Please call the main number to the clinic [phone] and follow the prompts.  For any non-urgent questions, you may also contact your provider using MyChart. We now offer e-Visits for anyone 43 and older to request care online for non-urgent symptoms. For details visit [email or web address].   Also download the MyChart app! Go to the app store, search "MyChart", open the app, select [facility], and log in with your MyChart username and password.  Due to Covid, a mask is required upon entering the hospital/clinic. If you do not have a mask, one will be given to you upon arrival. For doctor visits, patients may have 1 support person aged 37 or older with them. For treatment visits, patients cannot have anyone with them due to current Covid guidelines and our immunocompromised population.

## 2020-12-21 NOTE — Progress Notes (Signed)
Patient has no improvement in his left arm pain that is 9/10 now.

## 2020-12-21 NOTE — Progress Notes (Signed)
[place]  [phone] [phone]   Name: Donald Lowery Date: 12/21/2020 MRN: [id number]  DOB: [birth year]  Patient Care Team: [name], NP as PCP - General (Internal Medicine) [name], MD as PCP - Cardiology (Cardiology) Telford Nab, RN as Oncology Nurse Navigator    REASON FOR CONSULTATION: Donald Lowery is a 70 y.o. 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  Patient has had significant left shoulder pain.  PET/CT demonstrated a 7.1 cm left apical mass, which abuts the mediastinum encasing the left subclavian and vertebral arteries and is felt probably to have nerve impingement leading to referred pain.  He also has vocal cord paralysis.  Patient was referred to palliative care to help address goals to manage ongoing symptoms.  SOCIAL HISTORY:     reports that he quit smoking about 2 years ago. His smoking use included cigarettes. He has a 45.00 pack-year smoking history. He has never used smokeless tobacco. He reports that he does not drink alcohol and does not use drugs.  Patient is married and lives at home with his wife.  He had a son who is now deceased.  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  ADVANCE DIRECTIVES:  Not on file  CODE STATUS:   PAST MEDICAL HISTORY: Past Medical History:  Diagnosis Date   Aortic atherosclerosis ([place])    [place] hernia 09/21/2020   fatty   Coronary artery disease    a. 04/2018 NSTEMI/Cath: LM nl, LAD 50p, 75m/d diffuse-small caliber, LCX heavily Ca2+ sev prox/mid dzs, RCA 90 diff distal dzs into RPL and RPDA. EF 50-55%-->Med Rx.   Hepatic steatosis    History of 2019 novel coronavirus disease (COVID-19) 10/12/2020   History of echocardiogram    a. 04/2018 Echo: EF 55-60%, no rwma, mild MR, mildly dil LA. Nl RV fxn.   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 Quit 04/2018.    PAST SURGICAL HISTORY:  Past Surgical History:  Procedure Laterality Date   BRAIN SURGERY     CARDIAC CATHETERIZATION     IR IMAGING GUIDED PORT INSERTION  10/28/2020   LEFT HEART CATH AND CORONARY ANGIOGRAPHY N/A 05/16/2018   Procedure: LEFT HEART CATH AND CORONARY ANGIOGRAPHY poss PCI;  Surgeon: [name], MD;  Location: [facility];  Service: Cardiovascular;  Laterality: N/A;   VIDEO BRONCHOSCOPY WITH ENDOBRONCHIAL NAVIGATION N/A 10/24/2020   Procedure: ROBOTIC ASSISTED VIDEO BRONCHOSCOPY WITH ENDOBRONCHIAL NAVIGATION;  Surgeon: Tyler Pita, MD;  Location: [facility];  Service: Pulmonary;  Laterality: N/A;    HEMATOLOGY/ONCOLOGY HISTORY:  Oncology History  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[place])  09/21/2020 Initial Diagnosis   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[place])   10/28/2020 Cancer Staging   Staging form: Lung, AJCC 8th Edition - Clinical stage from 10/28/2020: Stage IVA (cT4, cN0, pM1a) - Signed by Lloyd Huger, MD on 10/28/2020   11/03/2020 -  Chemotherapy    Patient is on Treatment Plan: LUNG CARBOPLATIN / PACLITAXEL + XRT Q7D        ALLERGIES:  has No Known Allergies.  MEDICATIONS:  Current Outpatient Medications  Medication Sig Dispense Refill   aspirin EC 81 MG EC tablet Take 1 tablet (81 mg total) by mouth daily. 30 tablet 0   atorvastatin (LIPITOR) 80 MG tablet Take 1 tablet (80 mg total) by mouth daily at 6 PM. 90 tablet 0   dicyclomine (BENTYL) 10 MG capsule Take 1 capsule (10 mg total) by mouth 3 (three) times daily as needed for up to 14 days for spasms. 20 capsule 0   ezetimibe (ZETIA) 10 MG tablet Take 1 tablet (10 mg total) by  mouth daily. 90 tablet 0   gabapentin (NEURONTIN) 100 MG capsule Take 2 capsules (200 mg total) by mouth at bedtime for 7 days, THEN 3 capsules (300 mg total) at bedtime for 7 days, THEN 4 capsules (400 mg total) at bedtime for 16 days. Follow written titration schedule. 99 capsule 0   gabapentin (NEURONTIN) 300 MG capsule Take 1 capsule (300 mg total) by mouth 3 (three) times daily. (Patient not taking: Reported on 12/21/2020) 90 capsule 0   icosapent Ethyl (VASCEPA) 1 g capsule Take 2 capsules (2 g total) by mouth 2 (two) times daily. 120 capsule 6   isosorbide mononitrate (IMDUR) 30 MG 24 hr tablet Take 1 tablet (30 mg total) by mouth daily. 90 tablet 2   lidocaine-prilocaine (EMLA) cream Apply to affected area once 30 g 3   losartan (COZAAR) 25 MG tablet Take 1 tablet (25 mg total) by mouth daily. 90 tablet 1   Menthol-Methyl Salicylate (ICY HOT) 91-47 % STCK Apply 1 application topically daily as needed (shoulder pain).     metFORMIN (GLUCOPHAGE) 500 MG tablet Take 500 mg by mouth every morning.     morphine (MS CONTIN) 15 MG 12 hr tablet Take 2 tablets (30 mg total) by mouth every 12 (twelve) hours. 60 tablet 0   naloxone (NARCAN) nasal spray 4 mg/0.1 mL SPRAY 1 SPRAY INTO ONE NOSTRIL AS DIRECTED FOR OPIOID OVERDOSE (TURN PERSON ON SIDE AFTER DOSE. IF NO RESPONSE IN 2-3 MINUTES OR PERSON RESPONDS BUT RELAPSES, REPEAT USING A NEW SPRAY DEVICE AND SPRAY INTO THE OTHER NOSTRIL. CALL 911 AFTER USE.) * EMERGENCY USE ONLY * (Patient not taking: No sig reported) 1 each 0   nitroGLYCERIN (NITROSTAT) 0.4 MG SL tablet Place 1 tablet (0.4 mg total) under the tongue every 5 (five) minutes as needed for chest pain. 25 tablet 3   ondansetron (ZOFRAN) 8 MG tablet Take 1 tablet (8 mg total) by mouth 2 (two) times daily as needed for refractory nausea / vomiting. 60 tablet 1   Oxycodone HCl 10 MG TABS Take 1 tablet (10 mg total) by mouth every 4 (four) hours as needed. 60 tablet 0   prochlorperazine (COMPAZINE) 10  MG tablet Take 1 tablet (10 mg total) by mouth every 6 (six) hours as needed (Nausea or vomiting). 60 tablet 1   sucralfate (CARAFATE) 1 g tablet Take 0.5 tablets (0.5 g total) by mouth 3 (three) times daily before meals. 60 tablet 5   vitamin B-12 (CYANOCOBALAMIN) 1000 MCG tablet Take 1,000 mcg by mouth daily.     No current facility-administered medications for this visit.   Facility-Administered Medications Ordered in Other Visits  Medication Dose Route Frequency Provider Last Rate Last Admin   CARBOplatin (PARAPLATIN) 160 mg in sodium chloride 0.9 % 100 mL chemo infusion  160 mg Intravenous Once Lloyd Huger, MD       famotidine (PEPCID) IVPB  20 mg in NS 100 mL IVPB  20 mg Intravenous Once Lloyd Huger, MD 400 mL/hr at 12/21/20 1025 20 mg at 12/21/20 1025   heparin lock flush 100 UNIT/ML injection            heparin lock flush 100 unit/mL  500 Units Intravenous Once Lloyd Huger, MD       heparin lock flush 100 unit/mL  500 Units Intracatheter Once PRN Lloyd Huger, MD       PACLitaxel (TAXOL) 78 mg in sodium chloride 0.9 % 250 mL chemo infusion (</= 80mg /m2)  45 mg/m2 (Treatment Plan Recorded) Intravenous Once Lloyd Huger, MD        VITAL SIGNS: There were no vitals taken for this visit. There were no vitals filed for this visit.  Estimated body mass index is 20.5 kg/m as calculated from the following:   Height as of 11/29/20: 5\' 5"  (1.651 m).   Weight as of an earlier encounter on 12/21/20: 123 lb 3.2 oz (55.9 kg).  LABS: CBC:    Component Value Date/Time   WBC 11.1 (H) 12/21/2020 0835   HGB 8.4 (L) 12/21/2020 0835   HCT 25.7 (L) 12/21/2020 0835   PLT 369 12/21/2020 0835   MCV 88.3 12/21/2020 0835   NEUTROABS 8.8 (H) 12/21/2020 0835   LYMPHSABS 0.9 12/21/2020 0835   MONOABS 0.8 12/21/2020 0835   EOSABS 0.1 12/21/2020 0835   BASOSABS 0.1 12/21/2020 0835   Comprehensive Metabolic Panel:    Component Value Date/Time   NA 128 (L) 12/21/2020  0835   NA 138 12/16/2018 0933   K 4.2 12/21/2020 0835   CL 92 (L) 12/21/2020 0835   CO2 30 12/21/2020 0835   BUN 17 12/21/2020 0835   BUN 14 12/16/2018 0933   CREATININE 0.73 12/21/2020 0835   GLUCOSE 225 (H) 12/21/2020 0835   CALCIUM 8.9 12/21/2020 0835   AST 24 12/21/2020 0835   ALT 32 12/21/2020 0835   ALKPHOS 107 12/21/2020 0835   BILITOT 0.7 12/21/2020 0835   PROT 7.0 12/21/2020 0835   ALBUMIN 2.8 (L) 12/21/2020 0835    RADIOGRAPHIC STUDIES: DG Cervical Spine With Flex & Extend  Result Date: 12/01/2020 CLINICAL DATA:  Neck pain. EXAM: CERVICAL SPINE COMPLETE WITH FLEXION AND EXTENSION VIEWS COMPARISON:  None. FINDINGS: There is no acute fracture or subluxation of the cervical spine. There is straightening of normal cervical lordosis which may be positional or due to muscle spasm. There is apparent bony fusion C1-C2. grade 1 C4-C5 retrolisthesis. The visualized posterior elements appear intact. Multilevel facet arthropathy. The soft tissues are unremarkable. Bilateral carotid bulb calcified plaques. Partially visualized right-sided Port-A-Cath. IMPRESSION: 1. No acute fracture or subluxation of the cervical spine. 2. Multilevel facet arthropathy. 3. Grade 1 C4-C5 retrolisthesis. Electronically Signed   By: Anner Crete M.D.   On: 12/01/2020 01:18   NM PET Image Restage (PS) Skull Base to Thigh  Result Date: 12/13/2020 CLINICAL DATA:  Follow-up treatment strategy for non-small-cell lung cancer. EXAM: NUCLEAR MEDICINE PET SKULL BASE TO THIGH TECHNIQUE: 6.7 mCi F-18 FDG was injected intravenously. Full-ring PET imaging was performed from the skull base to thigh after the radiotracer. CT data was obtained and used for attenuation correction and anatomic localization. Fasting blood glucose: 130 mg/dl COMPARISON:  PET-CT dated October 04, 2020 FINDINGS: Mediastinal blood pool activity: SUV max 2.3 Liver activity: SUV max 2.8 NECK: No hypermetabolic cervical lymph nodes. Asymmetric FDG uptake  within the right vocal cord, likely related to  left vocal cord paralysis. Incidental CT findings: none CHEST: Left apical mass measures 5.8 x 5.2 cm, previously 7.1 x 6.0 cm and demonstrates decreased hypermetabolic activity with an SUV max of 5.3, previously 16.0. Left apical mass abuts the aortic arch and encases the left subclavian and vertebral arteries. Bilateral pulmonary nodules are increased in size compared to prior exam. Reference nodule of the right upper lobe measures 10 mm on image 80, previously 4 mm, within SUV max of 2.4, previously 1.0. Incidental CT findings: New small left pleural effusion. Paraseptal emphysema. Bibasilar atelectasis. ABDOMEN/PELVIS: No abnormal hypermetabolic activity within the liver, pancreas, adrenal glands, or spleen. No hypermetabolic lymph nodes in the abdomen or pelvis. Incidental CT findings: Cholecystectomy clips. Diverticula of the descending colon. Mild wall thickening of the urinary bladder, similar to prior. SKELETON: New hypermetabolic lytic lesion of the left iliac crest with an SUV max of 8.7. IMPRESSION: Decreased size and hypermetabolic activity of left apical mass. Bilateral solid pulmonary nodules are stable to increased in size compared to prior. New hypermetabolic lytic lesion of the left iliac crest, compatible with osseous metastatic disease. Aortic Atherosclerosis (ICD10-I70.0) and Emphysema (ICD10-J43.9). Electronically Signed   By: Yetta Glassman M.D.   On: 12/13/2020 09:14    PERFORMANCE STATUS (ECOG) : 1 - Symptomatic but completely ambulatory  Review of Systems Unless otherwise noted, a complete review of systems is negative.  Physical Exam General: NAD Pulmonary: Unlabored Extremities: no edema, no joint deformities Skin: no rashes Neurological: Weakness but otherwise nonfocal  IMPRESSION: Follow-up visit.  Patient seen in infusion.  Patient endorses persistent and severe shoulder pain, which she currently rates as 9 out of 10.   Despite telling me on 2 separate occasions the pain was significantly improved after dose adjustment of his MS Contin/oxycodone, patient now says that nothing we have ever tried has helped the pain and the pain is consistently been poorly controlled.  Patient had difficulty telling me what he is actually taking for pain.  He suggested that I call his wife to get the details.  I spoke with patient's wife by phone.  She does agree that pain was better after last dose adjustment of his MS Contin but she was fearful that he would not tolerate higher dose.  She says she is currently giving him MS Contin 15 mg every 8 hours and oxycodone 10 mg every 4 hours while awake.  She says that pain management had some issues with insurance covering spinal injection.  I suggested to patient's wife that we again increase MS Contin to 30 mg every 12 hours, which was the last dose/frequency the patient reported significant improvement in the pain.  He may continue taking oxycodone 10 mg every 4 hours.  Could consider increasing oxycodone if needed.  I requested that the wife call pain management to schedule follow-up visit.  Discussed bowel regimen.  Suggested increase MiraLAX and Senokot to prevent opioid-induced constipation.  PLAN: -Continue current scope of treatment -Increase MS Contin 30 mg every 12 hours -Continue oxycodone 10 mg tablets every 4 hours as needed for breakthrough pain -Continue gabapentin 300 mg 3 times daily -Continue daily bowel regimen -Will benefit from ACP conversation -Follow-up MyChart visit 2 weeks  Case and plan discussed with Dr. Grayland Ormond   Patient expressed understanding and was in agreement with this plan. He also understands that He can call the clinic at any time with any questions, concerns, or complaints.     Time Total: 25 minutes  Visit consisted of  counseling and education dealing with the complex and emotionally intense issues of symptom management and palliative  care in the setting of serious and potentially life-threatening illness.Greater than 50%  of this time was spent counseling and coordinating care related to the above assessment and plan.  Signed by: Altha Harm, PhD, NP-C

## 2020-12-22 ENCOUNTER — Ambulatory Visit: Payer: Medicare HMO

## 2020-12-22 NOTE — Progress Notes (Signed)
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  INTERVAL HISTORY: Patient returns to clinic today for further evaluation and continuation of weekly carboplatinum and Taxol along with his XRT boost.  He continues to have chronic right shoulder pain, but otherwise feels well.  He is tolerating his treatments without significant side effects.  He has no neurologic complaints.  He denies any recent fevers or illnesses.  He has a good appetite and denies weight loss.  He denies any chest pain, shortness of breath, cough, or hemoptysis.  He denies any abdominal pain.  He has no nausea, vomiting, constipation, or diarrhea.  He has no urinary complaints.  Patient offers no further specific complaints today.  REVIEW OF SYSTEMS:   Review of Systems  Constitutional: Negative.  Negative for fever, malaise/fatigue and weight loss.  Respiratory: Negative.  Negative for cough, hemoptysis and shortness of breath.   Cardiovascular: Negative.  Negative for chest pain and leg swelling.  Gastrointestinal:  Negative for abdominal pain.  Genitourinary: Negative.  Negative for dysuria.  Musculoskeletal:  Positive for joint pain.  Skin: Negative.  Negative for rash.  Neurological: Negative.  Negative for dizziness, focal weakness, weakness and headaches.   As per HPI. Otherwise, a complete review of systems is negative.  PAST MEDICAL HISTORY: Past Medical History:  Diagnosis Date   Aortic atherosclerosis ([place])    [place]dalek hernia 09/21/2020   fatty   Coronary artery disease    a. 04/2018 NSTEMI/Cath: LM nl, LAD 50p, 39md diffuse-small caliber, LCX heavily Ca2+  sev prox/mid dzs, RCA 90 diff distal dzs into RPL and RPDA. EF 50-55%-->Med Rx.   Hepatic steatosis    History of 2019 novel coronavirus disease (COVID-19) 10/12/2020   History of echocardiogram    a. 04/2018 Echo: EF 55-60%, no rwma, mild MR, mildly dil LA. Nl RV fxn.   Hyperlipidemia    Hypertension    NSTEMI (non-ST elevated myocardial infarction) ([facility] 05/15/2018   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 09/[place]   a.) 7 cm LUL mass with left subclavian/proximal vertebral encasement   Paraseptal emphysema ([facility])    T2DM (type 2 diabetes mellitus) ([facility]    Tobacco abuse    a. Quit 04/2018.    PAST SURGICAL HISTORY: Past Surgical History:  Procedure Laterality Date   BRAIN SURGERY     CARDIAC CATHETERIZATION     IR IMAGING GUIDED PORT INSERTION  10/28/2020   LEFT HEART CATH AND CORONARY ANGIOGRAPHY N/A 05/16/2018   Procedure: LEFT HEART CATH AND CORONARY ANGIOGRAPHY poss PCI;  Surgeon: GMinna Merritts MD;  [facility];  Service: Cardiovascular;  Laterality: N/A;   VIDEO BRONCHOSCOPY WITH ENDOBRONCHIAL NAVIGATION N/A 10/24/2020   Procedure: ROBOTIC ASSISTED VIDEO BRONCHOSCOPY WITH ENDOBRONCHIAL NAVIGATION;  Surgeon: GTyler Pita MD;  Location: [facility];  Service: Pulmonary;  Laterality: N/A;    FAMILY HISTORY: Family History  Problem Relation Age of Onset   Heart Problems Mother    Heart attack Father    Diabetes Brother    Heart Problems Brother     ADVANCED DIRECTIVES (Y/N):  N  HEALTH MAINTENANCE: Social History   Tobacco Use   Smoking status: Former  Packs/day: 1.00    Years: 45.00    Pack years: 45.00    Types: Cigarettes    Quit date: 05/15/2018    Years since quitting: 2.6   Smokeless tobacco: Never  Vaping Use   Vaping Use: Never used  Substance Use Topics   Alcohol use: Never   Drug use: Never     Colonoscopy:  PAP:  Bone density:  Lipid panel:  No Known Allergies  Current Outpatient Medications  Medication Sig Dispense  Refill   aspirin EC 81 MG EC tablet Take 1 tablet (81 mg total) by mouth daily. 30 tablet 0   atorvastatin (LIPITOR) 80 MG tablet Take 1 tablet (80 mg total) by mouth daily at 6 PM. 90 tablet 3   dicyclomine (BENTYL) 10 MG capsule Take 1 capsule (10 mg total) by mouth 3 (three) times daily as needed for up to 14 days for spasms. 20 capsule 0   ezetimibe (ZETIA) 10 MG tablet Take 1 tablet (10 mg total) by mouth daily. 90 tablet 3   gabapentin (NEURONTIN) 100 MG capsule Take 2 capsules (200 mg total) by mouth at bedtime for 7 days, THEN 3 capsules (300 mg total) at bedtime for 7 days, THEN 4 capsules (400 mg total) at bedtime for 16 days. Follow written titration schedule. 99 capsule 0   gabapentin (NEURONTIN) 300 MG capsule Take 1 capsule (300 mg total) by mouth 3 (three) times daily. (Patient not taking: Reported on 12/21/2020) 90 capsule 0   icosapent Ethyl (VASCEPA) 1 g capsule Take 2 capsules (2 g total) by mouth 2 (two) times daily. 120 capsule 6   isosorbide mononitrate (IMDUR) 30 MG 24 hr tablet Take 1 tablet (30 mg total) by mouth daily. 90 tablet 3   lidocaine-prilocaine (EMLA) cream Apply to affected area once 30 g 3   losartan (COZAAR) 25 MG tablet Take 1 tablet (25 mg total) by mouth daily. 90 tablet 3   Menthol-Methyl Salicylate (ICY HOT) 63-84 % STCK Apply 1 application topically daily as needed (shoulder pain).     metFORMIN (GLUCOPHAGE) 500 MG tablet Take 500 mg by mouth every morning.     morphine (MS CONTIN) 15 MG 12 hr tablet Take 2 tablets (30 mg total) by mouth every 12 (twelve) hours. 60 tablet 0   naloxone (NARCAN) nasal spray 4 mg/0.1 mL SPRAY 1 SPRAY INTO ONE NOSTRIL AS DIRECTED FOR OPIOID OVERDOSE (TURN PERSON ON SIDE AFTER DOSE. IF NO RESPONSE IN 2-3 MINUTES OR PERSON RESPONDS BUT RELAPSES, REPEAT USING A NEW SPRAY DEVICE AND SPRAY INTO THE OTHER NOSTRIL. CALL 911 AFTER USE.) * EMERGENCY USE ONLY * (Patient not taking: No sig reported) 1 each 0   nitroGLYCERIN (NITROSTAT) 0.4  MG SL tablet Place 1 tablet (0.4 mg total) under the tongue every 5 (five) minutes as needed for chest pain. 25 tablet 3   ondansetron (ZOFRAN) 8 MG tablet Take 1 tablet (8 mg total) by mouth 2 (two) times daily as needed for refractory nausea / vomiting. 60 tablet 1   Oxycodone HCl 10 MG TABS Take 1 tablet (10 mg total) by mouth every 4 (four) hours as needed. 60 tablet 0   prochlorperazine (COMPAZINE) 10 MG tablet Take 1 tablet (10 mg total) by mouth every 6 (six) hours as needed (Nausea or vomiting). 60 tablet 1   sucralfate (CARAFATE) 1 g tablet Take 0.5 tablets (0.5 g total) by mouth 3 (three) times daily before meals. 60 tablet 5   vitamin B-12 (CYANOCOBALAMIN) 1000  MCG tablet Take 1,000 mcg by mouth daily.     No current facility-administered medications for this visit.   Facility-Administered Medications Ordered in Other Visits  Medication Dose Route Frequency Provider Last Rate Last Admin   heparin lock flush 100 unit/mL  500 Units Intravenous Once Lloyd Huger, MD       sodium chloride flush (NS) 0.9 % injection 10 mL  10 mL Intravenous PRN Lloyd Huger, MD   10 mL at 12/28/20 0838   sodium chloride flush (NS) 0.9 % injection 10 mL  10 mL Intracatheter PRN Lloyd Huger, MD        OBJECTIVE: Vitals:   12/28/20 0929  BP: 116/69  Pulse: 99  Resp: 18  Temp: (!) 96.2 F (35.7 C)     Body mass index is 20.14 kg/m.    ECOG FS:1 - Symptomatic but completely ambulatory  General: Well-developed, well-nourished, no acute distress. Eyes: Pink conjunctiva, anicteric sclera. HEENT: Normocephalic, moist mucous membranes. Lungs: No audible wheezing or coughing. Heart: Regular rate and rhythm. Abdomen: Soft, nontender, no obvious distention. Musculoskeletal: No edema, cyanosis, or clubbing. Neuro: Alert, answering all questions appropriately. Cranial nerves grossly intact. Skin: No rashes or petechiae noted. Psych: Normal affect.   LAB RESULTS:  Lab Results   Component Value Date   NA 126 (L) 12/28/2020   K 3.7 12/28/2020   CL 91 (L) 12/28/2020   CO2 26 12/28/2020   GLUCOSE 191 (H) 12/28/2020   BUN 20 12/28/2020   CREATININE 0.71 12/28/2020   CALCIUM 9.2 12/28/2020   PROT 7.1 12/28/2020   ALBUMIN 3.0 (L) 12/28/2020   AST 77 (H) 12/28/2020   ALT 227 (H) 12/28/2020   ALKPHOS 567 (H) 12/28/2020   BILITOT 0.7 12/28/2020   GFRNONAA >60 12/28/2020   GFRAA >60 07/14/2019    Lab Results  Component Value Date   WBC 7.8 12/28/2020   NEUTROABS 6.4 12/28/2020   HGB 8.3 (L) 12/28/2020   HCT 25.2 (L) 12/28/2020   MCV 86.6 12/28/2020   PLT 343 12/28/2020     STUDIES: DG Cervical Spine With Flex & Extend  Result Date: 12/01/2020 CLINICAL DATA:  Neck pain. EXAM: CERVICAL SPINE COMPLETE WITH FLEXION AND EXTENSION VIEWS COMPARISON:  None. FINDINGS: There is no acute fracture or subluxation of the cervical spine. There is straightening of normal cervical lordosis which may be positional or due to muscle spasm. There is apparent bony fusion C1-C2. grade 1 C4-C5 retrolisthesis. The visualized posterior elements appear intact. Multilevel facet arthropathy. The soft tissues are unremarkable. Bilateral carotid bulb calcified plaques. Partially visualized right-sided Port-A-Cath. IMPRESSION: 1. No acute fracture or subluxation of the cervical spine. 2. Multilevel facet arthropathy. 3. Grade 1 C4-C5 retrolisthesis. Electronically Signed   By: Anner Crete M.D.   On: 12/01/2020 01:18   NM PET Image Restage (PS) Skull Base to Thigh  Result Date: 12/13/2020 CLINICAL DATA:  Follow-up treatment strategy for non-small-cell lung cancer. EXAM: NUCLEAR MEDICINE PET SKULL BASE TO THIGH TECHNIQUE: 6.7 mCi F-18 FDG was injected intravenously. Full-ring PET imaging was performed from the skull base to thigh after the radiotracer. CT data was obtained and used for attenuation correction and anatomic localization. Fasting blood glucose: 130 mg/dl COMPARISON:  PET-CT  dated October 04, 2020 FINDINGS: Mediastinal blood pool activity: SUV max 2.3 Liver activity: SUV max 2.8 NECK: No hypermetabolic cervical lymph nodes. 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 Incidental CT findings: none CHEST: Left  apical mass measures 5.8 x 5.2 cm, previously 7.1 x 6.0 cm and demonstrates decreased hypermetabolic activity with an SUV max of 5.3, previously 16.0. Left apical mass abuts the aortic arch and encases the left subclavian and vertebral arteries. Bilateral pulmonary nodules are increased in size compared to prior exam. Reference nodule of the right upper lobe measures 10 mm on image 80, previously 4 mm, within SUV max of 2.4, previously 1.0. Incidental CT findings: New small left pleural effusion. Paraseptal emphysema. Bibasilar atelectasis. ABDOMEN/PELVIS: No abnormal hypermetabolic activity within the liver, pancreas, adrenal glands, or spleen. No hypermetabolic lymph nodes in the abdomen or pelvis. Incidental CT findings: Cholecystectomy clips. Diverticula of the descending colon. Mild wall thickening of the urinary bladder, similar to prior. SKELETON: New hypermetabolic lytic lesion of the left iliac crest with an SUV max of 8.7. IMPRESSION: Decreased size and hypermetabolic activity of left apical mass. Bilateral solid pulmonary nodules are stable to increased in size compared to prior. New hypermetabolic lytic lesion of the left iliac crest, compatible with osseous metastatic disease. Aortic Atherosclerosis (ICD10-I70.0) and Emphysema (ICD10-J43.9). Electronically Signed   By: Yetta Glassman M.D.   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  PLAN:    1. 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  PET scan results from October 04, 2020 reviewed independently with hypermetabolic bilateral pulmonary nodules without  mediastinal or hilar lymphadenopathy.  Although imaging suggests stage IV disease, will treat patient as a stage IIIa.  MRI of the brain on October 08, 2020 was negative for disease.  Repeat PET scan results from December 13, 2020 reviewed independently and reported as above with improvement of hypermetabolic left apical mass as well as bilateral pulmonary nodules, patient noted to have a new hypermetabolic lytic lesion in his left iliac crest.  Radiation oncology has planned 3 additional weeks of XRT completing on January 11, 2021.  Proceed with cycle 7 of weekly carboplatin and Taxol today. Given his PD-L1 is 5%, will likely transition to [facility] once concurrent treatment has completed.  Return to clinic in 1 week for further evaluation and consideration of cycle 8.   2.  Shoulder pain: Unchanged.  Patient currently on MS Contin to every 8 hours and gabapentin along with PRN oxycodone. Appreciate palliative care input.  We will consider MRI of the shoulder once treatments are completed. 3.  Weight loss: Weight has stabilized.  Appreciate dietary input. 4.  Hyponatremia: Sodium slightly worse at 126 today.  Monitor. 5.  Leukocytosis: Resolved. 6.  Anemia: Chronic and unchanged.  Patient's hemoglobin is stable at 8.3. 7.  Transaminitis: Unclear etiology.  Proceed with treatment as above and repeat laboratory work in 1 week.  Patient expressed understanding and was in agreement with this plan. He also understands that He can call clinic at any time with any questions, concerns, or complaints.   Cancer Staging Non-small cell cancer of left lung [facility]) Staging form: Lung, AJCC 8th Edition - Clinical stage from 10/28/2020: Stage IVA (cT4, cN0, pM1a) - Signed by Lloyd Huger, MD on 10/28/2020  Lloyd Huger, MD   12/28/2020 4:11 PM

## 2020-12-23 ENCOUNTER — Ambulatory Visit
Admission: RE | Admit: 2020-12-23 | Discharge: 2020-12-23 | Disposition: A | Discharge status: Home / Self Care | Payer: Medicare HMO | Source: Ambulatory Visit | Attending: Radiation Oncology | Admitting: Radiation Oncology

## 2020-12-23 DIAGNOSIS — Z510 Encounter for antineoplastic radiation therapy: Secondary | ICD-10-CM | POA: Diagnosis not present

## 2020-12-26 ENCOUNTER — Telehealth: Payer: Self-pay | Admitting: Cardiovascular Disease

## 2020-12-26 ENCOUNTER — Ambulatory Visit
Admission: RE | Admit: 2020-12-26 | Discharge: 2020-12-26 | Disposition: A | Discharge status: Home / Self Care | Payer: Medicare HMO | Source: Ambulatory Visit | Attending: Radiation Oncology | Admitting: Radiation Oncology

## 2020-12-26 DIAGNOSIS — I10 Essential (primary) hypertension: Secondary | ICD-10-CM

## 2020-12-26 DIAGNOSIS — Z510 Encounter for antineoplastic radiation therapy: Secondary | ICD-10-CM | POA: Diagnosis not present

## 2020-12-26 DIAGNOSIS — I214 Non-ST elevation (NSTEMI) myocardial infarction: Secondary | ICD-10-CM

## 2020-12-26 MED ORDER — EZETIMIBE 10 MG PO TABS: 10.0000 mg | ORAL_TABLET | Freq: Every day | ORAL | 3 refills | Status: AC

## 2020-12-26 MED ORDER — ISOSORBIDE MONONITRATE ER 30 MG PO TB24: 30.0000 mg | ORAL_TABLET | Freq: Every day | ORAL | 3 refills | Status: AC

## 2020-12-26 MED ORDER — LOSARTAN POTASSIUM 25 MG PO TABS: 25.0000 mg | ORAL_TABLET | Freq: Every day | ORAL | 3 refills | Status: DC

## 2020-12-26 MED ORDER — NITROGLYCERIN 0.4 MG SL SUBL: 0.4000 mg | SUBLINGUAL_TABLET | SUBLINGUAL | 3 refills | Status: DC | PRN

## 2020-12-26 MED ORDER — ATORVASTATIN CALCIUM 80 MG PO TABS: 80.0000 mg | ORAL_TABLET | Freq: Every day | ORAL | 3 refills | Status: DC

## 2020-12-26 NOTE — Telephone Encounter (Signed)
atorvastatin (LIPITOR) 80 MG tablet 90 tablet 3 12/26/2020    Sig - Route: Take 1 tablet (80 mg total) by mouth daily at 6 PM. - Oral   Sent to pharmacy as: atorvastatin (LIPITOR) 80 MG tablet   E-Prescribing Status: Receipt confirmed by pharmacy (12/26/2020 12:29 PM EDT)

## 2020-12-26 NOTE — Telephone Encounter (Signed)
*  STAT* If patient is at the pharmacy, call can be transferred to refill team.   1. Which medications need to be refilled? (please list name of each medication and dose if known) Lipitor 80 mg  2. Which pharmacy/location (including street and city if local pharmacy) is medication to be sent to? [facility], [place]  3. Do they need a 30 day or 90 day supply? 90day

## 2020-12-27 ENCOUNTER — Ambulatory Visit
Admission: RE | Admit: 2020-12-27 | Discharge: 2020-12-27 | Disposition: A | Discharge status: Home / Self Care | Payer: Medicare HMO | Source: Ambulatory Visit | Attending: Radiation Oncology | Admitting: Radiation Oncology

## 2020-12-27 DIAGNOSIS — Z510 Encounter for antineoplastic radiation therapy: Secondary | ICD-10-CM | POA: Diagnosis not present

## 2020-12-28 ENCOUNTER — Inpatient Hospital Stay: Payer: Medicare HMO

## 2020-12-28 ENCOUNTER — Ambulatory Visit
Admission: RE | Admit: 2020-12-28 | Discharge: 2020-12-28 | Disposition: A | Discharge status: Home / Self Care | Payer: Medicare HMO | Source: Ambulatory Visit | Attending: Radiation Oncology | Admitting: Radiation Oncology

## 2020-12-28 ENCOUNTER — Other Ambulatory Visit: Payer: Self-pay

## 2020-12-28 ENCOUNTER — Inpatient Hospital Stay (HOSPITAL_BASED_OUTPATIENT_CLINIC_OR_DEPARTMENT_OTHER): Payer: Medicare HMO | Admitting: Oncology

## 2020-12-28 VITALS — BP 116/69 | HR 99 | Temp 96.2°F | Resp 18 | Wt 121.0 lb

## 2020-12-28 DIAGNOSIS — C3492 Malignant neoplasm of unspecified part of left bronchus or lung: Secondary | ICD-10-CM | POA: Diagnosis not present

## 2020-12-28 DIAGNOSIS — Z5111 Encounter for antineoplastic chemotherapy: Secondary | ICD-10-CM | POA: Diagnosis not present

## 2020-12-28 DIAGNOSIS — Z510 Encounter for antineoplastic radiation therapy: Secondary | ICD-10-CM | POA: Diagnosis not present

## 2020-12-28 LAB — COMPREHENSIVE METABOLIC PANEL WITH GFR
ALT: 227 U/L — ABNORMAL HIGH (ref 0–44)
AST: 77 U/L — ABNORMAL HIGH (ref 15–41)
Albumin: 3 g/dL — ABNORMAL LOW (ref 3.5–5.0)
Alkaline Phosphatase: 567 U/L — ABNORMAL HIGH (ref 38–126)
Anion gap: 9 (ref 5–15)
BUN: 20 mg/dL (ref 8–23)
CO2: 26 mmol/L (ref 22–32)
Calcium: 9.2 mg/dL (ref 8.9–10.3)
Chloride: 91 mmol/L — ABNORMAL LOW (ref 98–111)
Creatinine, Ser: 0.71 mg/dL (ref 0.61–1.24)
GFR, Estimated: >60 mL/min
Glucose, Bld: 191 mg/dL — ABNORMAL HIGH (ref 70–99)
Potassium: 3.7 mmol/L (ref 3.5–5.1)
Sodium: 126 mmol/L — ABNORMAL LOW (ref 135–145)
Total Bilirubin: 0.7 mg/dL (ref 0.3–1.2)
Total Protein: 7.1 g/dL (ref 6.5–8.1)

## 2020-12-28 LAB — CBC WITH DIFFERENTIAL/PLATELET
Abs Immature Granulocytes: 0.16 K/uL — ABNORMAL HIGH (ref 0.00–0.07)
Basophils Absolute: 0 K/uL (ref 0.0–0.1)
Basophils Relative: 0 %
Eosinophils Absolute: 0.1 K/uL (ref 0.0–0.5)
Eosinophils Relative: 1 %
HCT: 25.2 % — ABNORMAL LOW (ref 39.0–52.0)
Hemoglobin: 8.3 g/dL — ABNORMAL LOW (ref 13.0–17.0)
Immature Granulocytes: 2 %
Lymphocytes Relative: 9 %
Lymphs Abs: 0.7 K/uL (ref 0.7–4.0)
MCH: 28.5 pg (ref 26.0–34.0)
MCHC: 32.9 g/dL (ref 30.0–36.0)
MCV: 86.6 fL (ref 80.0–100.0)
Monocytes Absolute: 0.5 K/uL (ref 0.1–1.0)
Monocytes Relative: 6 %
Neutro Abs: 6.4 K/uL (ref 1.7–7.7)
Neutrophils Relative %: 82 %
Platelets: 343 K/uL (ref 150–400)
RBC: 2.91 MIL/uL — ABNORMAL LOW (ref 4.22–5.81)
RDW: 17.1 % — ABNORMAL HIGH (ref 11.5–15.5)
WBC: 7.8 K/uL (ref 4.0–10.5)
nRBC: 0 % (ref 0.0–0.2)

## 2020-12-28 MED ORDER — SODIUM CHLORIDE 0.9% FLUSH
10.0000 mL | INTRAVENOUS | Status: DC | PRN
  Filled 2020-12-28: qty 10

## 2020-12-28 MED ORDER — SODIUM CHLORIDE 0.9% FLUSH
10.0000 mL | INTRAVENOUS | Status: DC | PRN
  Administered 2020-12-28: 10 mL via INTRAVENOUS
  Filled 2020-12-28: qty 10

## 2020-12-28 MED ORDER — HEPARIN SOD (PORK) LOCK FLUSH 100 UNIT/ML IV SOLN
500.0000 [IU] | Freq: Once | INTRAVENOUS | Status: AC | PRN
  Administered 2020-12-28: 500 [IU]
  Filled 2020-12-28: qty 5

## 2020-12-28 MED ORDER — HEPARIN SOD (PORK) LOCK FLUSH 100 UNIT/ML IV SOLN
500.0000 [IU] | Freq: Once | INTRAVENOUS | Status: DC
  Filled 2020-12-28: qty 5

## 2020-12-28 MED ORDER — SODIUM CHLORIDE 0.9 % IV SOLN
Freq: Once | INTRAVENOUS | Status: AC
  Filled 2020-12-28: qty 250

## 2020-12-28 MED ORDER — DIPHENHYDRAMINE HCL 50 MG/ML IJ SOLN
25.0000 mg | Freq: Once | INTRAMUSCULAR | Status: AC
  Administered 2020-12-28: 25 mg via INTRAVENOUS
  Filled 2020-12-28: qty 1

## 2020-12-28 MED ORDER — FAMOTIDINE 20 MG IN NS 100 ML IVPB
20.0000 mg | Freq: Once | INTRAVENOUS | Status: AC
  Administered 2020-12-28: 20 mg via INTRAVENOUS
  Filled 2020-12-28: qty 20

## 2020-12-28 MED ORDER — SODIUM CHLORIDE 0.9 % IV SOLN
10.0000 mg | Freq: Once | INTRAVENOUS | Status: AC
  Administered 2020-12-28: 10 mg via INTRAVENOUS
  Filled 2020-12-28: qty 10

## 2020-12-28 MED ORDER — SODIUM CHLORIDE 0.9 % IV SOLN
45.0000 mg/m2 | Freq: Once | INTRAVENOUS | Status: AC
  Administered 2020-12-28: 78 mg via INTRAVENOUS
  Filled 2020-12-28: qty 13

## 2020-12-28 MED ORDER — PALONOSETRON HCL INJECTION 0.25 MG/5ML
0.2500 mg | Freq: Once | INTRAVENOUS | Status: AC
  Administered 2020-12-28: 0.25 mg via INTRAVENOUS
  Filled 2020-12-28: qty 5

## 2020-12-28 MED ORDER — SODIUM CHLORIDE 0.9 % IV SOLN
160.0000 mg | Freq: Once | INTRAVENOUS | Status: AC
  Administered 2020-12-28: 160 mg via INTRAVENOUS
  Filled 2020-12-28: qty 16

## 2020-12-28 NOTE — Progress Notes (Signed)
ALT 227 ok to proceed per MD

## 2020-12-28 NOTE — Patient Instructions (Signed)
[facility] [place] [facility]  Discharge Instructions: Thank you for choosing [facility] to provide your oncology and hematology care.  If you have a lab appointment with the Cancer Center, please go directly to the Cancer Center and check in at the registration area.  Wear comfortable clothing and clothing appropriate for easy access to any Portacath or PICC line.   We strive to give you quality time with your provider. You may need to reschedule your appointment if you arrive late (15 or more minutes).  Arriving late affects you and other patients whose appointments are after yours.  Also, if you miss three or more appointments without notifying the office, you may be dismissed from the clinic at the provider's discretion.      For prescription refill requests, have your pharmacy contact our office and allow 72 hours for refills to be completed.    Today you received the following chemotherapy and/or immunotherapy agents - paclitaxel, carboplatin      To help prevent nausea and vomiting after your treatment, we encourage you to take your nausea medication as directed.  BELOW ARE SYMPTOMS THAT SHOULD BE REPORTED IMMEDIATELY: *FEVER GREATER THAN 100.4 F (38 C) OR HIGHER *CHILLS OR SWEATING *NAUSEA AND VOMITING THAT IS NOT CONTROLLED WITH YOUR NAUSEA MEDICATION *UNUSUAL SHORTNESS OF BREATH *UNUSUAL BRUISING OR BLEEDING *URINARY PROBLEMS (pain or burning when urinating, or frequent urination) *BOWEL PROBLEMS (unusual diarrhea, constipation, pain near the anus) TENDERNESS IN MOUTH AND THROAT WITH OR WITHOUT PRESENCE OF ULCERS (sore throat, sores in mouth, or a toothache) UNUSUAL RASH, SWELLING OR PAIN  UNUSUAL VAGINAL DISCHARGE OR ITCHING   Items with * indicate a potential emergency and should be followed up as soon as possible or go to the Emergency Department if any problems should occur.  Please show the CHEMOTHERAPY ALERT CARD or IMMUNOTHERAPY ALERT  CARD at check-in to the Emergency Department and triage nurse.  Should you have questions after your visit or need to cancel or reschedule your appointment, please contact [facility] [place] [facility]  [phone] and follow the prompts.  Office hours are 8:00 a.m. to 4:30 p.m. Monday - Friday. Please note that voicemails left after 4:00 p.m. may not be returned until the following business day.  We are closed weekends and major holidays. You have access to a nurse at all times for urgent questions. Please call the main number to the clinic [phone] and follow the prompts.  For any non-urgent questions, you may also contact your provider using MyChart. We now offer e-Visits for anyone 18 and older to request care online for non-urgent symptoms. For details visit [email or web address].   Also download the MyChart app! Go to the app store, search "MyChart", open the app, select [facility], and log in with your MyChart username and password.  Due to Covid, a mask is required upon entering the hospital/clinic. If you do not have a mask, one will be given to you upon arrival. For doctor visits, patients may have 1 support person aged 18 or older with them. For treatment visits, patients cannot have anyone with them due to current Covid guidelines and our immunocompromised population.   Paclitaxel injection What is this medication? PACLITAXEL (PAK li TAX el) is a chemotherapy drug. It targets fast dividing cells, like cancer cells, and causes these cells to die. This medicine is used to treat ovarian cancer, breast cancer, lung cancer, Kaposi's sarcoma, andother cancers. This medicine may be used for other purposes;   ask your health care provider orpharmacist if you have questions. COMMON BRAND NAME(S): Onxol, Taxol What should I tell my care team before I take this medication? They need to know if you have any of these conditions: history of irregular heartbeat liver  disease low blood counts, like low white cell, platelet, or red cell counts lung or breathing disease, like asthma tingling of the fingers or toes, or other nerve disorder an unusual or allergic reaction to paclitaxel, alcohol, polyoxyethylated castor oil, other chemotherapy, other medicines, foods, dyes, or preservatives pregnant or trying to get pregnant breast-feeding How should I use this medication? This drug is given as an infusion into a vein. It is administered in a hospitalor clinic by a specially trained health care professional. Talk to your pediatrician regarding the use of this medicine in children.Special care may be needed. Overdosage: If you think you have taken too much of this medicine contact apoison control center or emergency room at once. NOTE: This medicine is only for you. Do not share this medicine with others. What if I miss a dose? It is important not to miss your dose. Call your doctor or health careprofessional if you are unable to keep an appointment. What may interact with this medication? Do not take this medicine with any of the following medications: live virus vaccines This medicine may also interact with the following medications: antiviral medicines for hepatitis, HIV or AIDS certain antibiotics like erythromycin and clarithromycin certain medicines for fungal infections like ketoconazole and itraconazole certain medicines for seizures like carbamazepine, phenobarbital, phenytoin gemfibrozil nefazodone rifampin St. John's wort This list may not describe all possible interactions. Give your health care provider a list of all the medicines, herbs, non-prescription drugs, or dietary supplements you use. Also tell them if you smoke, drink alcohol, or use illegaldrugs. Some items may interact with your medicine. What should I watch for while using this medication? Your condition will be monitored carefully while you are receiving this medicine. You will  need important blood work done while you are taking thismedicine. This medicine can cause serious allergic reactions. To reduce your risk you will need to take other medicine(s) before treatment with this medicine. If you experience allergic reactions like skin rash, itching or hives, swelling of theface, lips, or tongue, tell your doctor or health care professional right away. In some cases, you may be given additional medicines to help with side effects.Follow all directions for their use. This drug may make you feel generally unwell. This is not uncommon, as chemotherapy can affect healthy cells as well as cancer cells. Report any side effects. Continue your course of treatment even though you feel ill unless yourdoctor tells you to stop. Call your doctor or health care professional for advice if you get a fever, chills or sore throat, or other symptoms of a cold or flu. Do not treat yourself. This drug decreases your body's ability to fight infections. Try toavoid being around people who are sick. This medicine may increase your risk to bruise or bleed. Call your doctor orhealth care professional if you notice any unusual bleeding. Be careful brushing and flossing your teeth or using a toothpick because you may get an infection or bleed more easily. If you have any dental work done,tell your dentist you are receiving this medicine. Avoid taking products that contain aspirin, acetaminophen, ibuprofen, naproxen, or ketoprofen unless instructed by your doctor. These medicines may hide afever. Do not become pregnant while taking this medicine. Women should inform their   doctor if they wish to become pregnant or think they might be pregnant. There is a potential for serious side effects to an unborn child. Talk to your health care professional or pharmacist for more information. Do not breast-feed aninfant while taking this medicine. Men are advised not to father a child while receiving this medicine. This  product may contain alcohol. Ask your pharmacist or healthcare provider if this medicine contains alcohol. Be sure to tell all healthcare providers you are taking this medicine. Certain medicines, like metronidazole and disulfiram, can cause an unpleasant reaction when taken with alcohol. The reaction includes flushing, headache, nausea, vomiting, sweating, and increased thirst. Thereaction can last from 30 minutes to several hours. What side effects may I notice from receiving this medication? Side effects that you should report to your doctor or health care professionalas soon as possible: allergic reactions like skin rash, itching or hives, swelling of the face, lips, or tongue breathing problems changes in vision fast, irregular heartbeat high or low blood pressure mouth sores pain, tingling, numbness in the hands or feet signs of decreased platelets or bleeding - bruising, pinpoint red spots on the skin, black, tarry stools, blood in the urine signs of decreased red blood cells - unusually weak or tired, feeling faint or lightheaded, falls signs of infection - fever or chills, cough, sore throat, pain or difficulty passing urine signs and symptoms of liver injury like dark yellow or brown urine; general ill feeling or flu-like symptoms; light-colored stools; loss of appetite; nausea; right upper belly pain; unusually weak or tired; yellowing of the eyes or skin swelling of the ankles, feet, hands unusually slow heartbeat Side effects that usually do not require medical attention (report to yourdoctor or health care professional if they continue or are bothersome): diarrhea hair loss loss of appetite muscle or joint pain nausea, vomiting pain, redness, or irritation at site where injected tiredness This list may not describe all possible side effects. Call your doctor for medical advice about side effects. You may report side effects to FDA at1-800-FDA-1088. Where should I keep my  medication? This drug is given in a hospital or clinic and will not be stored at home. NOTE: This sheet is a summary. It may not cover all possible information. If you have questions about this medicine, talk to your doctor, pharmacist, orhealth care provider.  2022 Elsevier/Gold Standard (2019-03-18 13:37:23)  Carboplatin injection What is this medication? CARBOPLATIN (KAR boe pla tin) is a chemotherapy drug. It targets fast dividing cells, like cancer cells, and causes these cells to die. This medicine is usedto treat ovarian cancer and many other cancers. This medicine may be used for other purposes; ask your health care provider orpharmacist if you have questions. COMMON BRAND NAME(S): Paraplatin What should I tell my care team before I take this medication? They need to know if you have any of these conditions: blood disorders hearing problems kidney disease recent or ongoing radiation therapy an unusual or allergic reaction to carboplatin, cisplatin, other chemotherapy, other medicines, foods, dyes, or preservatives pregnant or trying to get pregnant breast-feeding How should I use this medication? This drug is usually given as an infusion into a vein. It is administered in ahospital or clinic by a specially trained health care professional. Talk to your pediatrician regarding the use of this medicine in children.Special care may be needed. Overdosage: If you think you have taken too much of this medicine contact apoison control center or emergency room at once. NOTE: This medicine   is only for you. Do not share this medicine with others. What if I miss a dose? It is important not to miss a dose. Call your doctor or health careprofessional if you are unable to keep an appointment. What may interact with this medication? medicines for seizures medicines to increase blood counts like filgrastim, pegfilgrastim, sargramostim some antibiotics like amikacin, gentamicin, neomycin,  streptomycin, tobramycin vaccines Talk to your doctor or health care professional before taking any of thesemedicines: acetaminophen aspirin ibuprofen ketoprofen naproxen This list may not describe all possible interactions. Give your health care provider a list of all the medicines, herbs, non-prescription drugs, or dietary supplements you use. Also tell them if you smoke, drink alcohol, or use illegaldrugs. Some items may interact with your medicine. What should I watch for while using this medication? Your condition will be monitored carefully while you are receiving this medicine. You will need important blood work done while you are taking thismedicine. This drug may make you feel generally unwell. This is not uncommon, as chemotherapy can affect healthy cells as well as cancer cells. Report any side effects. Continue your course of treatment even though you feel ill unless yourdoctor tells you to stop. In some cases, you may be given additional medicines to help with side effects.Follow all directions for their use. Call your doctor or health care professional for advice if you get a fever, chills or sore throat, or other symptoms of a cold or flu. Do not treat yourself. This drug decreases your body's ability to fight infections. Try toavoid being around people who are sick. This medicine may increase your risk to bruise or bleed. Call your doctor orhealth care professional if you notice any unusual bleeding. Be careful brushing and flossing your teeth or using a toothpick because you may get an infection or bleed more easily. If you have any dental work done,tell your dentist you are receiving this medicine. Avoid taking products that contain aspirin, acetaminophen, ibuprofen, naproxen, or ketoprofen unless instructed by your doctor. These medicines may hide afever. Do not become pregnant while taking this medicine. Women should inform their doctor if they wish to become pregnant or think  they might be pregnant. There is a potential for serious side effects to an unborn child. Talk to your health care professional or pharmacist for more information. Do not breast-feed aninfant while taking this medicine. What side effects may I notice from receiving this medication? Side effects that you should report to your doctor or health care professionalas soon as possible: allergic reactions like skin rash, itching or hives, swelling of the face, lips, or tongue signs of infection - fever or chills, cough, sore throat, pain or difficulty passing urine signs of decreased platelets or bleeding - bruising, pinpoint red spots on the skin, black, tarry stools, nosebleeds signs of decreased red blood cells - unusually weak or tired, fainting spells, lightheadedness breathing problems changes in hearing changes in vision chest pain high blood pressure low blood counts - This drug may decrease the number of white blood cells, red blood cells and platelets. You may be at increased risk for infections and bleeding. nausea and vomiting pain, swelling, redness or irritation at the injection site pain, tingling, numbness in the hands or feet problems with balance, talking, walking trouble passing urine or change in the amount of urine Side effects that usually do not require medical attention (report to yourdoctor or health care professional if they continue or are bothersome): hair loss loss of appetite metallic   taste in the mouth or changes in taste This list may not describe all possible side effects. Call your doctor for medical advice about side effects. You may report side effects to FDA at1-800-FDA-1088. Where should I keep my medication? This drug is given in a hospital or clinic and will not be stored at home. NOTE: This sheet is a summary. It may not cover all possible information. If you have questions about this medicine, talk to your doctor, pharmacist, orhealth care provider.  2022  Elsevier/Gold Standard (2007-07-22 14:38:05)  

## 2020-12-29 ENCOUNTER — Ambulatory Visit
Admission: RE | Admit: 2020-12-29 | Discharge: 2020-12-29 | Disposition: A | Discharge status: Home / Self Care | Payer: Medicare HMO | Source: Ambulatory Visit | Attending: Radiation Oncology | Admitting: Radiation Oncology

## 2020-12-29 DIAGNOSIS — C3412 Malignant neoplasm of upper lobe, left bronchus or lung: Secondary | ICD-10-CM | POA: Diagnosis not present

## 2020-12-29 DIAGNOSIS — Z510 Encounter for antineoplastic radiation therapy: Secondary | ICD-10-CM | POA: Insufficient documentation

## 2020-12-30 ENCOUNTER — Ambulatory Visit
Admission: RE | Admit: 2020-12-30 | Discharge: 2020-12-30 | Disposition: A | Discharge status: Home / Self Care | Payer: Medicare HMO | Source: Ambulatory Visit | Attending: Radiation Oncology | Admitting: Radiation Oncology

## 2020-12-30 DIAGNOSIS — Z510 Encounter for antineoplastic radiation therapy: Secondary | ICD-10-CM | POA: Diagnosis not present

## 2020-12-30 NOTE — Progress Notes (Signed)
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  INTERVAL HISTORY: Patient returns to clinic today for further evaluation and continuation of weekly carboplatin and Taxol.  He continues with daily XRT as well.  Patient states his shoulder pain has essentially resolved, but hurt his back riding his tractor earlier this week.  He otherwise feels well.  He has no neurologic complaints.  He denies any recent fevers or illnesses.  He has a good appetite and denies weight loss.  He denies any chest pain, shortness of breath, cough, or hemoptysis.  He denies any abdominal pain.  He has no nausea, vomiting, constipation, or diarrhea.  He has no urinary complaints.  Patient offers no further specific complaints today.  REVIEW OF SYSTEMS:   Review of Systems  Constitutional: Negative.  Negative for fever, malaise/fatigue and weight loss.  Respiratory: Negative.  Negative for cough, hemoptysis and shortness of breath.   Cardiovascular: Negative.  Negative for chest pain and leg swelling.  Gastrointestinal:  Negative for abdominal pain.  Genitourinary: Negative.  Negative for dysuria.  Musculoskeletal:  Positive for back pain. Negative for joint pain.  Skin: Negative.  Negative for rash.  Neurological: Negative.  Negative for dizziness, focal weakness, weakness and headaches.   As per HPI. Otherwise, a complete review of systems is negative.  PAST MEDICAL HISTORY: Past Medical History:  Diagnosis Date   Aortic atherosclerosis ([place])    [place] hernia 09/21/2020   fatty   Coronary artery disease    a. 04/2018 NSTEMI/Cath: LM nl, LAD 50p, 61md  diffuse-small caliber, LCX heavily Ca2+ sev prox/mid dzs, RCA 90 diff distal dzs into RPL and RPDA. EF 50-55%-->Med Rx.   Hepatic steatosis    History of 2019 novel coronavirus disease (COVID-19) 10/12/2020   History of echocardiogram    a. 04/2018 Echo: EF 55-60%, no rwma, mild MR, mildly dil LA. Nl RV fxn.   Hyperlipidemia    Hypertension    NSTEMI (non-ST elevated myocardial infarction) ([facility] 05/15/2018   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[facility] 09/21/2020   a.) 7 cm LUL mass with left subclavian/proximal vertebral encasement   Paraseptal emphysema ([facility])    T2DM (type 2 diabetes mellitus) ([facility]    Tobacco abuse    a. Quit 04/2018.    PAST SURGICAL HISTORY: Past Surgical History:  Procedure Laterality Date   BRAIN SURGERY     CARDIAC CATHETERIZATION     IR IMAGING GUIDED PORT INSERTION  10/28/2020   LEFT HEART CATH AND CORONARY ANGIOGRAPHY N/A 05/16/2018   Procedure: LEFT HEART CATH AND CORONARY ANGIOGRAPHY poss PCI;  Surgeon: GMinna Merritts MD;  [facility];  Service: Cardiovascular;  Laterality: N/A;   VIDEO BRONCHOSCOPY WITH ENDOBRONCHIAL NAVIGATION N/A 10/24/2020   Procedure: ROBOTIC ASSISTED VIDEO BRONCHOSCOPY WITH ENDOBRONCHIAL NAVIGATION;  Surgeon: GTyler Pita MD;  Location: [facility];  Service: Pulmonary;  Laterality: N/A;    FAMILY HISTORY: Family History  Problem Relation Age of Onset   Heart Problems Mother    Heart attack Father    Diabetes Brother    Heart Problems Brother     ADVANCED DIRECTIVES (Y/N):  N  HEALTH MAINTENANCE: Social History   Tobacco  Use   Smoking status: Former    Packs/day: 1.00    Years: 45.00    Pack years: 45.00    Types: Cigarettes    Quit date: 05/15/2018    Years since quitting: 2.6   Smokeless tobacco: Never  Vaping Use   Vaping Use: Never used  Substance Use Topics   Alcohol use: Never   Drug use: Never     Colonoscopy:  PAP:  Bone density:  Lipid panel:  No Known Allergies  Current Outpatient  Medications  Medication Sig Dispense Refill   aspirin EC 81 MG EC tablet Take 1 tablet (81 mg total) by mouth daily. 30 tablet 0   atorvastatin (LIPITOR) 80 MG tablet Take 1 tablet (80 mg total) by mouth daily at 6 PM. 90 tablet 3   cyclobenzaprine (FLEXERIL) 5 MG tablet Take 1 tablet (5 mg total) by mouth 3 (three) times daily as needed for muscle spasms. 30 tablet 0   ezetimibe (ZETIA) 10 MG tablet Take 1 tablet (10 mg total) by mouth daily. 90 tablet 3   isosorbide mononitrate (IMDUR) 30 MG 24 hr tablet Take 1 tablet (30 mg total) by mouth daily. 90 tablet 3   lidocaine-prilocaine (EMLA) cream Apply to affected area once 30 g 3   losartan (COZAAR) 25 MG tablet Take 1 tablet (25 mg total) by mouth daily. 90 tablet 3   Menthol-Methyl Salicylate (ICY HOT) 06-26 % STCK Apply 1 application topically daily as needed (shoulder pain).     metFORMIN (GLUCOPHAGE) 500 MG tablet Take 500 mg by mouth every morning.     ondansetron (ZOFRAN) 8 MG tablet Take 1 tablet (8 mg total) by mouth 2 (two) times daily as needed for refractory nausea / vomiting. 60 tablet 1   predniSONE (STERAPRED UNI-PAK 21 TAB) 10 MG (21) TBPK tablet Take 6 tablets (51m) x 1 day, then take 5 tablets (59m x 1 day, then take 4 tablets (4084mx 1 day, then take 3 tablets (19m73m 1 day, then take 2 tablets (20mg11m1 day, then take 1 tablet (10mg)72m day, then stop 1 each 0   sucralfate (CARAFATE) 1 g tablet Take 0.5 tablets (0.5 g total) by mouth 3 (three) times daily before meals. 60 tablet 5   vitamin B-12 (CYANOCOBALAMIN) 1000 MCG tablet Take 1,000 mcg by mouth daily.     dicyclomine (BENTYL) 10 MG capsule Take 1 capsule (10 mg total) by mouth 3 (three) times daily as needed for up to 14 days for spasms. 20 capsule 0   icosapent Ethyl (VASCEPA) 1 g capsule Take 2 capsules (2 g total) by mouth 2 (two) times daily. 120 capsule 6   morphine (MS CONTIN) 15 MG 12 hr tablet Take 2 tablets (30 mg total) by mouth every 12 (twelve) hours.  (Patient not taking: Reported on 01/04/2021) 60 tablet 0   naloxone (NARCAN) nasal spray 4 mg/0.1 mL SPRAY 1 SPRAY INTO ONE NOSTRIL AS DIRECTED FOR OPIOID OVERDOSE (TURN PERSON ON SIDE AFTER DOSE. IF NO RESPONSE IN 2-3 MINUTES OR PERSON RESPONDS BUT RELAPSES, REPEAT USING A NEW SPRAY DEVICE AND SPRAY INTO THE OTHER NOSTRIL. CALL 911 AFTER USE.) * EMERGENCY USE ONLY * (Patient not taking: No sig reported) 1 each 0   nitroGLYCERIN (NITROSTAT) 0.4 MG SL tablet Place 1 tablet (0.4 mg total) under the tongue every 5 (five) minutes as needed for chest pain. (Patient not taking: Reported on 01/04/2021) 25 tablet 3   Oxycodone HCl 10 MG  TABS Take 1 tablet (10 mg total) by mouth every 4 (four) hours as needed. (Patient not taking: Reported on 01/04/2021) 60 tablet 0   prochlorperazine (COMPAZINE) 10 MG tablet Take 1 tablet (10 mg total) by mouth every 6 (six) hours as needed (Nausea or vomiting). (Patient not taking: Reported on 01/04/2021) 60 tablet 1   No current facility-administered medications for this visit.    OBJECTIVE: Vitals:   01/04/21 0948  BP: 101/66  Pulse: 100  Resp: 18  Temp: (!) 97 F (36.1 C)     Body mass index is 19.64 kg/m.    ECOG FS:1 - Symptomatic but completely ambulatory  General: Well-developed, well-nourished, no acute distress. Eyes: Pink conjunctiva, anicteric sclera. HEENT: Normocephalic, moist mucous membranes. Lungs: No audible wheezing or coughing. Heart: Regular rate and rhythm. Abdomen: Soft, nontender, no obvious distention. Musculoskeletal: No edema, cyanosis, or clubbing. Neuro: Alert, answering all questions appropriately. Cranial nerves grossly intact. Skin: No rashes or petechiae noted. Psych: Normal affect.   LAB RESULTS:  Lab Results  Component Value Date   NA 126 (L) 01/04/2021   K 2.7 (LL) 01/04/2021   CL 91 (L) 01/04/2021   CO2 27 01/04/2021   GLUCOSE 259 (H) 01/04/2021   BUN 16 01/04/2021   CREATININE 0.64 01/04/2021   CALCIUM 8.5 (L)  01/04/2021   PROT 6.7 01/04/2021   ALBUMIN 3.0 (L) 01/04/2021   AST 24 01/04/2021   ALT 38 01/04/2021   ALKPHOS 235 (H) 01/04/2021   BILITOT 0.3 01/04/2021   GFRNONAA >60 01/04/2021   GFRAA >60 07/14/2019    Lab Results  Component Value Date   WBC 4.1 01/04/2021   NEUTROABS 3.5 01/04/2021   HGB 8.3 (L) 01/04/2021   HCT 24.8 (L) 01/04/2021   MCV 85.8 01/04/2021   PLT 362 01/04/2021     STUDIES: DG Lumbar Spine Complete  Result Date: 01/04/2021 CLINICAL DATA:  Back pain EXAM: LUMBAR SPINE - COMPLETE 4+ VIEW COMPARISON:  None. FINDINGS: Vertebral body heights are well-maintained. Levocurvature of the lumbar spine with rotatory component. No evidence of fracture. Minimal retrolisthesis of L1 on L2 and L3 and L4. Multilevel degenerative disc disease with mild-to-moderate osteophyte formation and moderate disc space height loss at L3-L4. Mild multilevel facet arthropathy. Aortic vascular calcifications. IMPRESSION: No acute osseous abnormality. Mild-to-moderate multilevel degenerative disc disease and mild facet arthropathy. Electronically Signed   By: Yetta Glassman M.D.   On: 01/04/2021 10:24   NM PET Image Restage (PS) Skull Base to Thigh  Result Date: 12/13/2020 CLINICAL DATA:  Follow-up treatment strategy for non-small-cell lung cancer. EXAM: NUCLEAR MEDICINE PET SKULL BASE TO THIGH TECHNIQUE: 6.7 mCi F-18 FDG was injected intravenously. Full-ring PET imaging was performed from the skull base to thigh after the radiotracer. CT data was obtained and used for attenuation correction and anatomic localization. Fasting blood glucose: 130 mg/dl COMPARISON:  PET-CT dated October 04, 2020 FINDINGS: Mediastinal blood pool activity: SUV max 2.3 Liver activity: SUV max 2.8 NECK: No hypermetabolic cervical lymph nodes. 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 Incidental CT findings: none CHEST: Left apical mass measures 5.8 x 5.2 cm, previously 7.1  x 6.0 cm and demonstrates decreased hypermetabolic activity with an SUV max of 5.3, previously 16.0. Left apical mass abuts the aortic arch and encases the left subclavian and vertebral arteries. Bilateral pulmonary nodules are increased in size compared to prior exam. Reference nodule of the right upper lobe measures 10 mm on image  80, previously 4 mm, within SUV max of 2.4, previously 1.0. Incidental CT findings: New small left pleural effusion. Paraseptal emphysema. Bibasilar atelectasis. ABDOMEN/PELVIS: No abnormal hypermetabolic activity within the liver, pancreas, adrenal glands, or spleen. No hypermetabolic lymph nodes in the abdomen or pelvis. Incidental CT findings: Cholecystectomy clips. Diverticula of the descending colon. Mild wall thickening of the urinary bladder, similar to prior. SKELETON: New hypermetabolic lytic lesion of the left iliac crest with an SUV max of 8.7. IMPRESSION: Decreased size and hypermetabolic activity of left apical mass. Bilateral solid pulmonary nodules are stable to increased in size compared to prior. New hypermetabolic lytic lesion of the left iliac crest, compatible with osseous metastatic disease. Aortic Atherosclerosis (ICD10-I70.0) and Emphysema (ICD10-J43.9). Electronically Signed   By: Yetta Glassman M.D.   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  PLAN:    1. 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  PET scan results from October 04, 2020 reviewed independently with hypermetabolic bilateral pulmonary nodules without mediastinal or hilar lymphadenopathy.  Although imaging suggests stage IV disease, will treat patient as a stage IIIa.  MRI of the brain on October 08, 2020 was negative for disease.  Repeat PET scan results from December 13, 2020 reviewed independently and reported as above with improvement of hypermetabolic left apical mass as well as bilateral  pulmonary nodules, patient noted to have a new hypermetabolic lytic lesion in his left iliac crest.  Radiation oncology has planned 3 additional weeks of XRT completing on January 11, 2021.  Proceed with cycle 8 of weekly carboplatinum and Taxol today.  Given his PD-L1 is 5%, will likely transition to [facility] once concurrent treatment has completed.  Return to clinic in 1 week for further evaluation and consideration of his ninth and final cycle of treatment.   2.  Shoulder pain: Resolved, but patient now has back pain.  Appreciate palliative care input.  Can we will consider MRI of the shoulder once treatments are completed. 3.  Weight loss: Weight has stabilized.  Appreciate dietary input. 4.  Hyponatremia: Chronic and unchanged.  Patient's sodium is 126 today. 5.  Leukocytosis: Resolved. 6.  Anemia: Chronic and unchanged.  Patient's hemoglobin is stable at 8.3. 7.  Transaminitis: Resolved. 8.  Hypokalemia: Patient will receive 40 mill equivalents IV potassium today.  Patient expressed understanding and was in agreement with this plan. He also understands that He can call clinic at any time with any questions, concerns, or complaints.   Cancer Staging Non-small cell cancer of left lung [facility]) Staging form: Lung, AJCC 8th Edition - Clinical stage from 10/28/2020: Stage IVA (cT4, cN0, pM1a) - Signed by Lloyd Huger, MD on 10/28/2020  Lloyd Huger, MD   01/05/2021 10:34 AM

## 2021-01-03 ENCOUNTER — Other Ambulatory Visit: Payer: Self-pay | Admitting: Licensed Clinical Social Worker

## 2021-01-03 ENCOUNTER — Ambulatory Visit
Admission: RE | Admit: 2021-01-03 | Discharge: 2021-01-03 | Disposition: A | Discharge status: Home / Self Care | Payer: Medicare HMO | Source: Ambulatory Visit | Attending: Radiation Oncology | Admitting: Radiation Oncology

## 2021-01-03 ENCOUNTER — Inpatient Hospital Stay (HOSPITAL_BASED_OUTPATIENT_CLINIC_OR_DEPARTMENT_OTHER): Payer: Medicare HMO | Admitting: Hospice and Palliative Medicine

## 2021-01-03 ENCOUNTER — Ambulatory Visit
Admission: RE | Admit: 2021-01-03 | Discharge: 2021-01-03 | Disposition: A | Discharge status: Home / Self Care | Payer: Medicare HMO | Source: Ambulatory Visit | Attending: Hospice and Palliative Medicine | Admitting: Hospice and Palliative Medicine

## 2021-01-03 DIAGNOSIS — M549 Dorsalgia, unspecified: Secondary | ICD-10-CM

## 2021-01-03 DIAGNOSIS — C3412 Malignant neoplasm of upper lobe, left bronchus or lung: Secondary | ICD-10-CM | POA: Insufficient documentation

## 2021-01-03 DIAGNOSIS — M5136 Other intervertebral disc degeneration, lumbar region: Secondary | ICD-10-CM | POA: Insufficient documentation

## 2021-01-03 DIAGNOSIS — Z510 Encounter for antineoplastic radiation therapy: Secondary | ICD-10-CM | POA: Diagnosis not present

## 2021-01-03 DIAGNOSIS — E871 Hypo-osmolality and hyponatremia: Secondary | ICD-10-CM | POA: Insufficient documentation

## 2021-01-03 DIAGNOSIS — Z5111 Encounter for antineoplastic chemotherapy: Secondary | ICD-10-CM | POA: Insufficient documentation

## 2021-01-03 DIAGNOSIS — I10 Essential (primary) hypertension: Secondary | ICD-10-CM | POA: Insufficient documentation

## 2021-01-03 DIAGNOSIS — E119 Type 2 diabetes mellitus without complications: Secondary | ICD-10-CM | POA: Insufficient documentation

## 2021-01-03 DIAGNOSIS — D649 Anemia, unspecified: Secondary | ICD-10-CM | POA: Insufficient documentation

## 2021-01-03 DIAGNOSIS — E876 Hypokalemia: Secondary | ICD-10-CM | POA: Insufficient documentation

## 2021-01-03 DIAGNOSIS — M47816 Spondylosis without myelopathy or radiculopathy, lumbar region: Secondary | ICD-10-CM | POA: Insufficient documentation

## 2021-01-03 MED ORDER — PREDNISONE 10 MG (21) PO TBPK: ORAL_TABLET | ORAL | 0 refills | Status: DC

## 2021-01-03 MED ORDER — CYCLOBENZAPRINE HCL 5 MG PO TABS: 5.0000 mg | ORAL_TABLET | Freq: Three times a day (TID) | ORAL | 0 refills | Status: DC | PRN

## 2021-01-03 NOTE — Progress Notes (Signed)
[place]  [phone] [phone]   Name: Donald Lowery Date: 01/03/2021 MRN: [id number]  DOB: 1950-05-24  Patient Care Team: [name], NP as PCP - General (Internal Medicine) [name], MD as PCP - Cardiology (Cardiology) Telford Nab, RN as Oncology Nurse Navigator    REASON FOR CONSULTATION: Donald Lowery is a 70 y.o. 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  Patient has had significant left shoulder pain.  PET/CT demonstrated a 7.1 cm left apical mass, which abuts the mediastinum encasing the left subclavian and vertebral arteries and is felt probably to have nerve impingement leading to referred pain.  He also has vocal cord paralysis.  Patient was referred to palliative care to help address goals to manage ongoing symptoms.  SOCIAL HISTORY:     reports that he quit smoking about 2 years ago. His smoking use included cigarettes. He has a 45.00 pack-year smoking history. He has never used smokeless tobacco. He reports that he does not drink alcohol and does not use drugs.  Patient is married and lives at home with his wife.  He had a son who is now deceased.  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  ADVANCE DIRECTIVES:  Not on file  CODE STATUS:   PAST MEDICAL HISTORY: Past Medical History:  Diagnosis Date   Aortic atherosclerosis ([place])    [place] hernia 09/21/2020   fatty   Coronary artery disease    a. 04/2018 NSTEMI/Cath: LM nl, LAD 50p, 12m/d diffuse-small caliber, LCX heavily Ca2+ sev prox/mid dzs, RCA 90 diff distal dzs into RPL and RPDA. EF 50-55%-->Med Rx.   Hepatic steatosis    History of 2019 novel coronavirus disease (COVID-19) 10/12/2020   History of echocardiogram    a. 04/2018 Echo: EF 55-60%, no rwma, mild MR, mildly dil LA. Nl RV fxn.   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 Quit 04/2018.    PAST SURGICAL HISTORY:  Past Surgical History:  Procedure Laterality Date   BRAIN SURGERY     CARDIAC CATHETERIZATION     IR IMAGING GUIDED PORT INSERTION  10/28/2020   LEFT HEART CATH AND CORONARY ANGIOGRAPHY N/A 05/16/2018   Procedure: LEFT HEART CATH AND CORONARY ANGIOGRAPHY poss PCI;  Surgeon: [name], MD;  Location: [facility];  Service: Cardiovascular;  Laterality: N/A;   VIDEO BRONCHOSCOPY WITH ENDOBRONCHIAL NAVIGATION N/A 10/24/2020   Procedure: ROBOTIC ASSISTED VIDEO BRONCHOSCOPY WITH ENDOBRONCHIAL NAVIGATION;  Surgeon: Tyler Pita, MD;  Location: [facility];  Service: Pulmonary;  Laterality: N/A;    HEMATOLOGY/ONCOLOGY HISTORY:  Oncology History  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[place])  09/21/2020 Initial Diagnosis   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[place])   10/28/2020 Cancer Staging   Staging form: Lung, AJCC 8th Edition - Clinical stage from 10/28/2020: Stage IVA (cT4, cN0, pM1a) - Signed by Lloyd Huger, MD on 10/28/2020   11/03/2020 -  Chemotherapy    Patient is on Treatment Plan: LUNG CARBOPLATIN / PACLITAXEL + XRT Q7D        ALLERGIES:  has No Known Allergies.  MEDICATIONS:  Current Outpatient Medications  Medication Sig Dispense Refill   cyclobenzaprine (FLEXERIL) 5 MG tablet Take 1 tablet (5 mg total) by mouth 3 (three) times daily as needed for muscle spasms. 30 tablet 0   predniSONE (STERAPRED UNI-PAK 21 TAB) 10 MG (21) TBPK tablet Take 6 tablets (60mg ) x 1 day, then take 5 tablets (50mg ) x 1 day, then take 4 tablets (40mg ) x 1 day, then take 3 tablets (30mg ) x 1 day, then take 2 tablets (20mg ) x 1 day, then take 1 tablet (10mg ) x  1 day, then stop 1 each 0   aspirin EC 81 MG EC tablet Take 1 tablet (81 mg total) by mouth daily. 30 tablet 0   atorvastatin (LIPITOR) 80 MG tablet Take 1 tablet (80 mg total) by mouth daily at 6 PM. 90 tablet 3   dicyclomine (BENTYL) 10 MG capsule Take 1 capsule (10 mg total) by mouth 3 (three) times daily as needed for up to 14 days for spasms. 20 capsule 0   ezetimibe (ZETIA) 10 MG tablet Take 1 tablet (10 mg total) by mouth daily. 90 tablet 3   gabapentin (NEURONTIN) 100 MG capsule Take 2 capsules (200 mg total) by mouth at bedtime for 7 days, THEN 3 capsules (300 mg total) at bedtime for 7 days, THEN 4 capsules (400 mg total) at bedtime for 16 days. Follow written titration schedule. 99 capsule 0   gabapentin (NEURONTIN) 300 MG capsule Take 1 capsule (300 mg total) by mouth 3 (three) times daily. (Patient not taking: Reported on 12/21/2020) 90 capsule 0   icosapent Ethyl (VASCEPA) 1 g capsule Take 2 capsules (2 g total) by mouth 2 (two) times daily. 120 capsule 6   isosorbide mononitrate (IMDUR) 30 MG 24 hr tablet Take 1 tablet (30 mg total) by mouth daily. 90 tablet 3   lidocaine-prilocaine (EMLA) cream Apply to affected area once 30 g 3   losartan (COZAAR) 25 MG tablet Take 1 tablet (25 mg total) by mouth daily. 90 tablet 3   Menthol-Methyl Salicylate (ICY HOT) 22-97 % STCK Apply 1 application topically daily as needed (shoulder pain).     metFORMIN (GLUCOPHAGE) 500 MG tablet Take 500 mg by mouth every morning.     morphine (MS CONTIN) 15 MG 12 hr tablet Take 2 tablets (30 mg total) by mouth every 12 (twelve) hours. 60 tablet 0   naloxone (NARCAN) nasal spray 4 mg/0.1 mL SPRAY 1 SPRAY INTO ONE NOSTRIL AS DIRECTED FOR OPIOID OVERDOSE (TURN PERSON ON SIDE AFTER DOSE. IF NO RESPONSE IN 2-3 MINUTES OR PERSON RESPONDS BUT RELAPSES, REPEAT USING A NEW SPRAY DEVICE AND SPRAY INTO THE OTHER NOSTRIL. CALL 911 AFTER USE.) * EMERGENCY USE ONLY * (Patient not taking: No sig reported) 1 each 0    nitroGLYCERIN (NITROSTAT) 0.4 MG SL tablet Place 1 tablet (0.4 mg total) under the tongue every 5 (five) minutes as needed for chest pain. 25 tablet 3   ondansetron (ZOFRAN) 8 MG tablet Take 1 tablet (8 mg total) by mouth 2 (two) times daily as needed for refractory nausea / vomiting. 60 tablet 1   Oxycodone HCl 10 MG TABS Take 1 tablet (10 mg total) by mouth every 4 (four) hours as needed. 60 tablet 0   prochlorperazine (COMPAZINE) 10 MG tablet Take 1 tablet (10 mg total) by mouth every 6 (six) hours as needed (Nausea or vomiting). 60 tablet 1   sucralfate (CARAFATE) 1 g tablet Take 0.5  tablets (0.5 g total) by mouth 3 (three) times daily before meals. 60 tablet 5   vitamin B-12 (CYANOCOBALAMIN) 1000 MCG tablet Take 1,000 mcg by mouth daily.     No current facility-administered medications for this visit.    VITAL SIGNS: There were no vitals taken for this visit. There were no vitals filed for this visit.  Estimated body mass index is 20.14 kg/m as calculated from the following:   Height as of 11/29/20: 5\' 5"  (1.651 m).   Weight as of 12/28/20: 121 lb (54.9 kg).  LABS: CBC:    Component Value Date/Time   WBC 7.8 12/28/2020 0838   HGB 8.3 (L) 12/28/2020 0838   HCT 25.2 (L) 12/28/2020 0838   PLT 343 12/28/2020 0838   MCV 86.6 12/28/2020 0838   NEUTROABS 6.4 12/28/2020 0838   LYMPHSABS 0.7 12/28/2020 0838   MONOABS 0.5 12/28/2020 0838   EOSABS 0.1 12/28/2020 0838   BASOSABS 0.0 12/28/2020 0838   Comprehensive Metabolic Panel:    Component Value Date/Time   NA 126 (L) 12/28/2020 0838   NA 138 12/16/2018 0933   K 3.7 12/28/2020 0838   CL 91 (L) 12/28/2020 0838   CO2 26 12/28/2020 0838   BUN 20 12/28/2020 0838   BUN 14 12/16/2018 0933   CREATININE 0.71 12/28/2020 0838   GLUCOSE 191 (H) 12/28/2020 0838   CALCIUM 9.2 12/28/2020 0838   AST 77 (H) 12/28/2020 0838   ALT 227 (H) 12/28/2020 0838   ALKPHOS 567 (H) 12/28/2020 0838   BILITOT 0.7 12/28/2020 0838   PROT 7.1 12/28/2020  0838   ALBUMIN 3.0 (L) 12/28/2020 0838    RADIOGRAPHIC STUDIES: NM PET Image Restage (PS) Skull Base to Thigh  Result Date: 12/13/2020 CLINICAL DATA:  Follow-up treatment strategy for non-small-cell lung cancer. EXAM: NUCLEAR MEDICINE PET SKULL BASE TO THIGH TECHNIQUE: 6.7 mCi F-18 FDG was injected intravenously. Full-ring PET imaging was performed from the skull base to thigh after the radiotracer. CT data was obtained and used for attenuation correction and anatomic localization. Fasting blood glucose: 130 mg/dl COMPARISON:  PET-CT dated October 04, 2020 FINDINGS: Mediastinal blood pool activity: SUV max 2.3 Liver activity: SUV max 2.8 NECK: No hypermetabolic cervical lymph nodes. 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 Incidental CT findings: none CHEST: Left apical mass measures 5.8 x 5.2 cm, previously 7.1 x 6.0 cm and demonstrates decreased hypermetabolic activity with an SUV max of 5.3, previously 16.0. Left apical mass abuts the aortic arch and encases the left subclavian and vertebral arteries. Bilateral pulmonary nodules are increased in size compared to prior exam. Reference nodule of the right upper lobe measures 10 mm on image 80, previously 4 mm, within SUV max of 2.4, previously 1.0. Incidental CT findings: New small left pleural effusion. Paraseptal emphysema. Bibasilar atelectasis. ABDOMEN/PELVIS: No abnormal hypermetabolic activity within the liver, pancreas, adrenal glands, or spleen. No hypermetabolic lymph nodes in the abdomen or pelvis. Incidental CT findings: Cholecystectomy clips. Diverticula of the descending colon. Mild wall thickening of the urinary bladder, similar to prior. SKELETON: New hypermetabolic lytic lesion of the left iliac crest with an SUV max of 8.7. IMPRESSION: Decreased size and hypermetabolic activity of left apical mass. Bilateral solid pulmonary nodules are stable to increased in size compared to prior. New  hypermetabolic lytic lesion of the left iliac crest, compatible with osseous metastatic disease. Aortic Atherosclerosis (ICD10-I70.0) and Emphysema (ICD10-J43.9). Electronically Signed   By: Hosie Poisson.D.  On: 12/13/2020 09:14    PERFORMANCE STATUS (ECOG) : 1 - Symptomatic but completely ambulatory  Review of Systems Unless otherwise noted, a complete review of systems is negative.  Physical Exam General: NAD Cardiac: RRR Pulmonary: Unlabored, clear to auscultation anterior/posterior fields Abdomen: Soft, nontender to palp Extremities: no edema, no joint deformities, no focal tenderness to spine.  No evidence of trauma or bruising Skin: no rashes Neurological: Weakness but otherwise nonfocal  IMPRESSION: Patient was added onto my clinic schedule today for evaluation management of back pain.  Patient reports that he injured his back about a week ago after trying to put the tractor into gear.  He reports sharp low back pain adjacent to his spine.  He also feels like his muscles are spasming.  He has been wearing a back brace and using heat packs and lidocaine patches without relief.  He has tried taking oxycodone but has not found it to be particularly effective.  Patient reports that he has otherwise stopped the oxycodone and MS Contin because his shoulder pain had significantly improved.  PET scan on 8/15 revealed new hypermetabolic lesion in the left iliac crest but otherwise no other skeletal metastasis.  Will send patient for lumbar x-ray.  We will plan to start short course of prednisone and muscle relaxers.  PLAN: -Continue current scope of treatment -May continue MS Contin/oxycodone -Start prednisone taper -Start Flexeril 5 mg 3 times daily as needed for muscle spasms -Continue gabapentin 300 mg 3 times daily -Continue daily bowel regimen -Will benefit from ACP conversation -Follow-up MyChart visit later this week  Case and plan discussed with Dr. Grayland Ormond   Patient  expressed understanding and was in agreement with this plan. He also understands that He can call the clinic at any time with any questions, concerns, or complaints.     Time Total: 15 minutes  Visit consisted of counseling and education dealing with the complex and emotionally intense issues of symptom management and palliative care in the setting of serious and potentially life-threatening illness.Greater than 50%  of this time was spent counseling and coordinating care related to the above assessment and plan.  Signed by: Altha Harm, PhD, NP-C

## 2021-01-04 ENCOUNTER — Inpatient Hospital Stay: Payer: Medicare HMO

## 2021-01-04 ENCOUNTER — Inpatient Hospital Stay (HOSPITAL_BASED_OUTPATIENT_CLINIC_OR_DEPARTMENT_OTHER): Payer: Medicare HMO | Admitting: Oncology

## 2021-01-04 ENCOUNTER — Ambulatory Visit
Admission: RE | Admit: 2021-01-04 | Discharge: 2021-01-04 | Disposition: A | Discharge status: Home / Self Care | Payer: Medicare HMO | Source: Ambulatory Visit | Attending: Radiation Oncology | Admitting: Radiation Oncology

## 2021-01-04 VITALS — BP 101/66 | HR 100 | Temp 97.0°F | Resp 18 | Wt 118.0 lb

## 2021-01-04 DIAGNOSIS — Z510 Encounter for antineoplastic radiation therapy: Secondary | ICD-10-CM | POA: Diagnosis not present

## 2021-01-04 DIAGNOSIS — C3492 Malignant neoplasm of unspecified part of left bronchus or lung: Secondary | ICD-10-CM

## 2021-01-04 LAB — COMPREHENSIVE METABOLIC PANEL
ALT: 38 U/L (ref 0–44)
AST: 24 U/L (ref 15–41)
Albumin: 3 g/dL — ABNORMAL LOW (ref 3.5–5.0)
Alkaline Phosphatase: 235 U/L — ABNORMAL HIGH (ref 38–126)
Anion gap: 8 (ref 5–15)
BUN: 16 mg/dL (ref 8–23)
CO2: 27 mmol/L (ref 22–32)
Calcium: 8.5 mg/dL — ABNORMAL LOW (ref 8.9–10.3)
Chloride: 91 mmol/L — ABNORMAL LOW (ref 98–111)
Creatinine, Ser: 0.64 mg/dL (ref 0.61–1.24)
GFR, Estimated: >60 mL/min (ref >60)
Glucose, Bld: 259 mg/dL — ABNORMAL HIGH (ref 70–99)
Potassium: 2.7 mmol/L — CL (ref 3.5–5.1)
Sodium: 126 mmol/L — ABNORMAL LOW (ref 135–145)
Total Bilirubin: 0.3 mg/dL (ref 0.3–1.2)
Total Protein: 6.7 g/dL (ref 6.5–8.1)

## 2021-01-04 LAB — CBC WITH DIFFERENTIAL/PLATELET
Abs Immature Granulocytes: 0.04 10*3/uL (ref 0.00–0.07)
Basophils Absolute: 0 10*3/uL (ref 0.0–0.1)
Basophils Relative: 0 %
Eosinophils Absolute: 0 10*3/uL (ref 0.0–0.5)
Eosinophils Relative: 0 %
HCT: 24.8 % — ABNORMAL LOW (ref 39.0–52.0)
Hemoglobin: 8.3 g/dL — ABNORMAL LOW (ref 13.0–17.0)
Immature Granulocytes: 1 %
Lymphocytes Relative: 8 %
Lymphs Abs: 0.3 10*3/uL — ABNORMAL LOW (ref 0.7–4.0)
MCH: 28.7 pg (ref 26.0–34.0)
MCHC: 33.5 g/dL (ref 30.0–36.0)
MCV: 85.8 fL (ref 80.0–100.0)
Monocytes Absolute: 0.2 10*3/uL (ref 0.1–1.0)
Monocytes Relative: 5 %
Neutro Abs: 3.5 10*3/uL (ref 1.7–7.7)
Neutrophils Relative %: 86 %
Platelets: 362 10*3/uL (ref 150–400)
RBC: 2.89 MIL/uL — ABNORMAL LOW (ref 4.22–5.81)
RDW: 17.3 % — ABNORMAL HIGH (ref 11.5–15.5)
WBC: 4.1 10*3/uL (ref 4.0–10.5)
nRBC: 0 % (ref 0.0–0.2)

## 2021-01-04 MED ORDER — SODIUM CHLORIDE 0.9 % IV SOLN
10.0000 mg | Freq: Once | INTRAVENOUS | Status: AC
  Administered 2021-01-04: 10 mg via INTRAVENOUS
  Filled 2021-01-04: qty 10

## 2021-01-04 MED ORDER — DIPHENHYDRAMINE HCL 50 MG/ML IJ SOLN
25.0000 mg | Freq: Once | INTRAMUSCULAR | Status: AC
  Administered 2021-01-04: 25 mg via INTRAVENOUS
  Filled 2021-01-04: qty 1

## 2021-01-04 MED ORDER — SODIUM CHLORIDE 0.9 % IV SOLN
Freq: Once | INTRAVENOUS | Status: AC
  Filled 2021-01-04: qty 250

## 2021-01-04 MED ORDER — SODIUM CHLORIDE 0.9 % IV SOLN
INTRAVENOUS | Status: DC
  Filled 2021-01-04: qty 250

## 2021-01-04 MED ORDER — SODIUM CHLORIDE 0.9 % IV SOLN
160.0000 mg | Freq: Once | INTRAVENOUS | Status: AC
  Administered 2021-01-04: 160 mg via INTRAVENOUS
  Filled 2021-01-04: qty 16

## 2021-01-04 MED ORDER — PALONOSETRON HCL INJECTION 0.25 MG/5ML
0.2500 mg | Freq: Once | INTRAVENOUS | Status: AC
  Administered 2021-01-04: 0.25 mg via INTRAVENOUS
  Filled 2021-01-04: qty 5

## 2021-01-04 MED ORDER — FAMOTIDINE 20 MG IN NS 100 ML IVPB
20.0000 mg | Freq: Once | INTRAVENOUS | Status: AC
  Administered 2021-01-04: 20 mg via INTRAVENOUS
  Filled 2021-01-04: qty 20

## 2021-01-04 MED ORDER — SODIUM CHLORIDE 0.9% FLUSH
10.0000 mL | INTRAVENOUS | Status: DC | PRN
  Administered 2021-01-04: 10 mL via INTRAVENOUS
  Filled 2021-01-04: qty 10

## 2021-01-04 MED ORDER — SODIUM CHLORIDE 0.9 % IV SOLN
40.0000 meq | Freq: Once | INTRAVENOUS | Status: AC
  Administered 2021-01-04: 40 meq via INTRAVENOUS
  Filled 2021-01-04: qty 20

## 2021-01-04 MED ORDER — PACLITAXEL CHEMO INJECTION 300 MG/50ML
45.0000 mg/m2 | Freq: Once | INTRAVENOUS | Status: AC
  Administered 2021-01-04: 78 mg via INTRAVENOUS
  Filled 2021-01-04: qty 13

## 2021-01-04 MED ORDER — HEPARIN SOD (PORK) LOCK FLUSH 100 UNIT/ML IV SOLN
INTRAVENOUS | Status: AC
  Filled 2021-01-04: qty 5

## 2021-01-04 MED ORDER — HEPARIN SOD (PORK) LOCK FLUSH 100 UNIT/ML IV SOLN
500.0000 [IU] | Freq: Once | INTRAVENOUS | Status: AC
  Administered 2021-01-04: 500 [IU] via INTRAVENOUS
  Filled 2021-01-04: qty 5

## 2021-01-04 NOTE — Progress Notes (Signed)
He states that his appetite is "so-so" and he is sleeping "so-so" he pulled a muscle in his back yesterday and is on prednisone and flexeril. He has lost some weight since last appt. He had diarrhea over the weekend.

## 2021-01-04 NOTE — Patient Instructions (Signed)
[facility]  Discharge Instructions: Thank you for choosing [facility] to provide your oncology and hematology care.  If you have a lab appointment with the [facility], please go directly to the [facility] and check in at the registration area.  Wear comfortable clothing and clothing appropriate for easy access to any Portacath or PICC line.   We strive to give you quality time with your provider. You may need to reschedule your appointment if you arrive late (15 or more minutes).  Arriving late affects you and other patients whose appointments are after yours.  Also, if you miss three or more appointments without notifying the office, you may be dismissed from the clinic at the provider's discretion.      For prescription refill requests, have your pharmacy contact our office and allow 72 hours for refills to be completed.    Today you received the following chemotherapy and/or immunotherapy agents : Taxol / Carboplatin / Potassium   To help prevent nausea and vomiting after your treatment, we encourage you to take your nausea medication as directed.  BELOW ARE SYMPTOMS THAT SHOULD BE REPORTED IMMEDIATELY: *FEVER GREATER THAN 100.4 F (38 C) OR HIGHER *CHILLS OR SWEATING *NAUSEA AND VOMITING THAT IS NOT CONTROLLED WITH YOUR NAUSEA MEDICATION *UNUSUAL SHORTNESS OF BREATH *UNUSUAL BRUISING OR BLEEDING *URINARY PROBLEMS (pain or burning when urinating, or frequent urination) *BOWEL PROBLEMS (unusual diarrhea, constipation, pain near the anus) TENDERNESS IN MOUTH AND THROAT WITH OR WITHOUT PRESENCE OF ULCERS (sore throat, sores in mouth, or a toothache) UNUSUAL RASH, SWELLING OR PAIN  UNUSUAL VAGINAL DISCHARGE OR ITCHING   Items with * indicate a potential emergency and should be followed up as soon as possible or go to the Emergency Department if any problems should occur.  Please show the CHEMOTHERAPY ALERT CARD or IMMUNOTHERAPY  ALERT CARD at check-in to the Emergency Department and triage nurse.  Should you have questions after your visit or need to cancel or reschedule your appointment, please contact [facility]  [phone] and follow the prompts.  Office hours are 8:00 a.m. to 4:30 p.m. Monday - Friday. Please note that voicemails left after 4:00 p.m. may not be returned until the following business day.  We are closed weekends and major holidays. You have access to a nurse at all times for urgent questions. Please call the main number to the clinic [phone] and follow the prompts.  For any non-urgent questions, you may also contact your provider using MyChart. We now offer e-Visits for anyone 63 and older to request care online for non-urgent symptoms. For details visit [email or web address].   Also download the MyChart app! Go to the app store, search "MyChart", open the app, select [facility], and log in with your MyChart username and password.  Due to Covid, a mask is required upon entering the hospital/clinic. If you do not have a mask, one will be given to you upon arrival. For doctor visits, patients may have 1 support person aged 21 or older with them. For treatment visits, patients cannot have anyone with them due to current Covid guidelines and our immunocompromised population.

## 2021-01-05 ENCOUNTER — Other Ambulatory Visit: Payer: Self-pay

## 2021-01-05 ENCOUNTER — Inpatient Hospital Stay (HOSPITAL_BASED_OUTPATIENT_CLINIC_OR_DEPARTMENT_OTHER): Payer: Medicare HMO | Admitting: Hospice and Palliative Medicine

## 2021-01-05 ENCOUNTER — Encounter: Payer: Self-pay | Admitting: Oncology

## 2021-01-05 ENCOUNTER — Encounter: Payer: Self-pay | Admitting: Nurse Practitioner

## 2021-01-05 ENCOUNTER — Ambulatory Visit
Admission: RE | Admit: 2021-01-05 | Discharge: 2021-01-05 | Disposition: A | Discharge status: Home / Self Care | Payer: Medicare HMO | Source: Ambulatory Visit | Attending: Radiation Oncology | Admitting: Radiation Oncology

## 2021-01-05 DIAGNOSIS — Z515 Encounter for palliative care: Secondary | ICD-10-CM

## 2021-01-05 DIAGNOSIS — G893 Neoplasm related pain (acute) (chronic): Secondary | ICD-10-CM | POA: Diagnosis not present

## 2021-01-05 DIAGNOSIS — C3492 Malignant neoplasm of unspecified part of left bronchus or lung: Secondary | ICD-10-CM

## 2021-01-05 DIAGNOSIS — Z510 Encounter for antineoplastic radiation therapy: Secondary | ICD-10-CM | POA: Diagnosis not present

## 2021-01-05 DIAGNOSIS — M549 Dorsalgia, unspecified: Secondary | ICD-10-CM

## 2021-01-05 NOTE — Progress Notes (Signed)
Referral faxed to [facility].

## 2021-01-05 NOTE — Progress Notes (Signed)
Virtual Visit via Telephone Note  I connected with Donald Lowery on 01/05/21 at  1:30 PM EDT by telephone and verified that I am speaking with the correct person using two identifiers.  Location: Patient: Home Provider: Clinic   I discussed the limitations, risks, security and privacy concerns of performing an evaluation and management service by telephone and the availability of in person appointments. I also discussed with the patient that there may be a patient responsible charge related to this service. The patient expressed understanding and agreed to proceed.   History of Present Illness: Donald Lowery is a 70 y.o. 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  Patient has had significant left shoulder pain.  PET/CT demonstrated a 7.1 cm left apical mass, which abuts the mediastinum encasing the left subclavian and vertebral arteries and is felt probably to have nerve impingement leading to referred pain.  He also has vocal cord paralysis.  Patient was referred to palliative care to help address goals to manage ongoing symptoms.   Observations/Objective: I called and spoke with patient/wife by phone.  Patient has had slight improvement in pain but continues to endorse persistent back pain.  He has previous back injuries and wife says that they take a significant amount of time to heal.  Patient is interested in receiving steroid injection.  Will refer to Ortho.  Assessment and Plan: Back pain -continue pain regimen with oxycodone/MS Contin/Flexeril.  Will refer to Ortho  Follow Up Instructions: Follow-up MyChart visit 1 month   I discussed the assessment and treatment plan with the patient. The patient was provided an opportunity to ask questions and all were answered. The patient agreed with the plan and demonstrated an understanding of the instructions.   The patient was advised to call back or seek an  in-person evaluation if the symptoms worsen or if the condition fails to improve as anticipated.  I provided 10 minutes of non-face-to-face time during this encounter.   Irean Hong, NP

## 2021-01-05 NOTE — Progress Notes (Signed)
Referral faxed to [facility] for back pain.

## 2021-01-06 ENCOUNTER — Ambulatory Visit
Admission: RE | Admit: 2021-01-06 | Discharge: 2021-01-06 | Disposition: A | Discharge status: Home / Self Care | Payer: Medicare HMO | Source: Ambulatory Visit | Attending: Radiation Oncology | Admitting: Radiation Oncology

## 2021-01-06 DIAGNOSIS — Z510 Encounter for antineoplastic radiation therapy: Secondary | ICD-10-CM | POA: Diagnosis not present

## 2021-01-07 NOTE — Progress Notes (Signed)
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  INTERVAL HISTORY: Patient returns to clinic today for further evaluation and his final infusion of weekly carboplatinum and Taxol.  He no longer has right shoulder pain, but continues to have significant musculoskeletal back pain.  He otherwise feels well.  He has no neurologic complaints.  He denies any recent fevers or illnesses.  He has a good appetite and denies weight loss.  He denies any chest pain, shortness of breath, cough, or hemoptysis.  He denies any abdominal pain.  He has no nausea, vomiting, constipation, or diarrhea.  He has no urinary complaints.  Patient offers no further specific complaints today.  REVIEW OF SYSTEMS:   Review of Systems  Constitutional: Negative.  Negative for fever, malaise/fatigue and weight loss.  Respiratory: Negative.  Negative for cough, hemoptysis and shortness of breath.   Cardiovascular: Negative.  Negative for chest pain and leg swelling.  Gastrointestinal:  Negative for abdominal pain.  Genitourinary: Negative.  Negative for dysuria.  Musculoskeletal:  Positive for back pain. Negative for joint pain.  Skin: Negative.  Negative for rash.  Neurological: Negative.  Negative for dizziness, focal weakness, weakness and headaches.   As per HPI. Otherwise, a complete review of systems is negative.  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 04/2018 NSTEMI/Cath: LM nl, LAD 50p, 25md diffuse-small caliber, LCX heavily Ca2+ sev prox/mid  dzs, RCA 90 diff distal dzs into RPL and RPDA. EF 50-55%-->Med Rx.   Hepatic steatosis    History of 2019 novel coronavirus disease (COVID-19) 10/12/2020   History of echocardiogram    a. 04/2018 Echo: EF 55-60%, no rwma, mild MR, mildly dil LA. Nl RV fxn.   Hyperlipidemia    Hypertension    [place]STEMI (non-ST elevated [place] 05[place]   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 09/[place]   a.) 7 cm LUL mass with left subclavian/proximal vertebral encasement   Paraseptal emphysema ([facility])    T2DM (type 2 diabetes mellitus) ([place]    Tobacco abuse    a. Quit 04/2018.    PAST SURGICAL HISTORY: Past Surgical History:  Procedure Laterality Date   BRAIN SURGERY     CARDIAC CATHETERIZATION     IR IMAGING GUIDED PORT INSERTION  10/28/2020   LEFT HEART CATH AND CORONARY ANGIOGRAPHY N/A 05/16/2018   Procedure: LEFT HEART CATH AND CORONARY ANGIOGRAPHY poss PCI;  Surgeon: GMinna Merritts MD;  [facility];  Service: Cardiovascular;  Laterality: N/A;   VIDEO BRONCHOSCOPY WITH ENDOBRONCHIAL NAVIGATION N/A 10/24/2020   Procedure: ROBOTIC ASSISTED VIDEO BRONCHOSCOPY WITH ENDOBRONCHIAL NAVIGATION;  Surgeon: GTyler Pita MD;  Location: [facility];  Service: Pulmonary;  Laterality: N/A;    FAMILY HISTORY: Family History  Problem Relation Age of Onset   Heart Problems Mother    Heart attack Father    Diabetes Brother    Heart Problems Brother     ADVANCED DIRECTIVES (Y/N):  N  HEALTH MAINTENANCE: Social History   Tobacco Use   Smoking status: Former  Packs/day: 1.00    Years: 45.00    Pack years: 45.00    Types: Cigarettes    Quit date: 05/15/2018    Years since quitting: 2.6   Smokeless tobacco: Never  Vaping Use   Vaping Use: Never used  Substance Use Topics   Alcohol use: Never   Drug use: Never     Colonoscopy:  PAP:  Bone density:  Lipid panel:  No Known Allergies  Current Outpatient Medications  Medication Sig Dispense Refill    aspirin EC 81 MG EC tablet Take 1 tablet (81 mg total) by mouth daily. 30 tablet 0   atorvastatin (LIPITOR) 80 MG tablet Take 1 tablet (80 mg total) by mouth daily at 6 PM. 90 tablet 3   cyclobenzaprine (FLEXERIL) 5 MG tablet Take 1 tablet (5 mg total) by mouth 3 (three) times daily as needed for muscle spasms. 30 tablet 0   dicyclomine (BENTYL) 10 MG capsule Take 1 capsule (10 mg total) by mouth 3 (three) times daily as needed for up to 14 days for spasms. 20 capsule 0   ezetimibe (ZETIA) 10 MG tablet Take 1 tablet (10 mg total) by mouth daily. 90 tablet 3   icosapent Ethyl (VASCEPA) 1 g capsule Take 2 capsules (2 g total) by mouth 2 (two) times daily. 120 capsule 6   isosorbide mononitrate (IMDUR) 30 MG 24 hr tablet Take 1 tablet (30 mg total) by mouth daily. 90 tablet 3   lidocaine (LIDODERM) 5 % Place 1 patch onto the skin every 12 (twelve) hours. Remove & Discard patch within 12 hours or as directed by MD 10 patch 0   lidocaine-prilocaine (EMLA) cream Apply to affected area once 30 g 3   Menthol-Methyl Salicylate (ICY HOT) 47-42 % STCK Apply 1 application topically daily as needed (shoulder pain).     metFORMIN (GLUCOPHAGE) 500 MG tablet Take 500 mg by mouth every morning.     ondansetron (ZOFRAN) 8 MG tablet Take 1 tablet (8 mg total) by mouth 2 (two) times daily as needed for refractory nausea / vomiting. 60 tablet 1   sucralfate (CARAFATE) 1 g tablet Take 0.5 tablets (0.5 g total) by mouth 3 (three) times daily before meals. 60 tablet 5   vitamin B-12 (CYANOCOBALAMIN) 1000 MCG tablet Take 1,000 mcg by mouth daily.     losartan (COZAAR) 25 MG tablet Take 1 tablet (25 mg total) by mouth daily. (Patient not taking: Reported on 01/11/2021) 90 tablet 3   morphine (MS CONTIN) 15 MG 12 hr tablet Take 2 tablets (30 mg total) by mouth every 12 (twelve) hours. 60 tablet 0   naloxone (NARCAN) nasal spray 4 mg/0.1 mL SPRAY 1 SPRAY INTO ONE NOSTRIL AS DIRECTED FOR OPIOID OVERDOSE (TURN PERSON ON SIDE AFTER  DOSE. IF NO RESPONSE IN 2-3 MINUTES OR PERSON RESPONDS BUT RELAPSES, REPEAT USING A NEW SPRAY DEVICE AND SPRAY INTO THE OTHER NOSTRIL. CALL 911 AFTER USE.) * EMERGENCY USE ONLY * (Patient not taking: No sig reported) 1 each 0   nitroGLYCERIN (NITROSTAT) 0.4 MG SL tablet Place 1 tablet (0.4 mg total) under the tongue every 5 (five) minutes as needed for chest pain. (Patient not taking: No sig reported) 25 tablet 3   Oxycodone HCl 10 MG TABS Take 1 tablet (10 mg total) by mouth every 4 (four) hours as needed. 60 tablet 0   predniSONE (STERAPRED UNI-PAK 21 TAB) 10 MG (21) TBPK tablet Take 6 tablets (53m) x 1 day, then take 5 tablets (559m  x 1 day, then take 4 tablets ($RemoveBe'40mg'nDHoNxatM$ ) x 1 day, then take 3 tablets ($RemoveBe'30mg'UvQuCPOpT$ ) x 1 day, then take 2 tablets ($RemoveBe'20mg'XDqekuxIC$ ) x 1 day, then take 1 tablet ($RemoveB'10mg'sAIvJUqz$ ) x 1 day, then stop (Patient not taking: Reported on 01/11/2021) 1 each 0   prochlorperazine (COMPAZINE) 10 MG tablet Take 1 tablet (10 mg total) by mouth every 6 (six) hours as needed (Nausea or vomiting). (Patient not taking: No sig reported) 60 tablet 1   No current facility-administered medications for this visit.    OBJECTIVE: Vitals:   01/11/21 0903  BP: 100/65  Pulse: (!) 111  Resp: 16  Temp: (!) 96.7 F (35.9 C)     Body mass index is 20.15 kg/m.    ECOG FS:1 - Symptomatic but completely ambulatory  General: Well-developed, well-nourished, no acute distress. Eyes: Pink conjunctiva, anicteric sclera. HEENT: Normocephalic, moist mucous membranes. Lungs: No audible wheezing or coughing. Heart: Regular rate and rhythm. Abdomen: Soft, nontender, no obvious distention. Musculoskeletal: No edema, cyanosis, or clubbing. Neuro: Alert, answering all questions appropriately. Cranial nerves grossly intact. Skin: No rashes or petechiae noted. Psych: Normal affect.  LAB RESULTS:  Lab Results  Component Value Date   NA 128 (L) 01/11/2021   K 3.1 (L) 01/11/2021   CL 90 (L) 01/11/2021   CO2 30 01/11/2021   GLUCOSE  188 (H) 01/11/2021   BUN 20 01/11/2021   CREATININE 0.81 01/11/2021   CALCIUM 8.8 (L) 01/11/2021   PROT 6.4 (L) 01/11/2021   ALBUMIN 3.0 (L) 01/11/2021   AST 16 01/11/2021   ALT 19 01/11/2021   ALKPHOS 158 (H) 01/11/2021   BILITOT 1.0 01/11/2021   GFRNONAA >60 01/11/2021   GFRAA >60 07/14/2019    Lab Results  Component Value Date   WBC 5.2 01/11/2021   NEUTROABS 4.3 01/11/2021   HGB 8.7 (L) 01/11/2021   HCT 26.0 (L) 01/11/2021   MCV 87.0 01/11/2021   PLT 280 01/11/2021     STUDIES: DG Lumbar Spine Complete  Result Date: 01/04/2021 CLINICAL DATA:  Back pain EXAM: LUMBAR SPINE - COMPLETE 4+ VIEW COMPARISON:  None. FINDINGS: Vertebral body heights are well-maintained. Levocurvature of the lumbar spine with rotatory component. No evidence of fracture. Minimal retrolisthesis of L1 on L2 and L3 and L4. Multilevel degenerative disc disease with mild-to-moderate osteophyte formation and moderate disc space height loss at L3-L4. Mild multilevel facet arthropathy. Aortic vascular calcifications. IMPRESSION: No acute osseous abnormality. Mild-to-moderate multilevel degenerative disc disease and mild facet arthropathy. Electronically Signed   By: Yetta Glassman M.D.   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  PLAN:    1. 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  PET scan results from October 04, 2020 reviewed independently with hypermetabolic bilateral pulmonary nodules without mediastinal or hilar lymphadenopathy.  Although imaging suggests stage IV disease, will treat patient as a stage IIIa.  MRI of the brain on October 08, 2020 was negative for disease.  Repeat PET scan results from December 13, 2020 reviewed independently and reported as above with improvement of hypermetabolic left apical mass as well as bilateral pulmonary nodules, patient noted to have a new hypermetabolic lytic  lesion in his left iliac crest.  Radiation oncology has planned 3 additional weeks of XRT completing on January 11, 2021.  Proceed with cycle 9 and final infusion of weekly carboplatinum and Taxol today.  Patient's PDL-1 is 5%, therefore  will now transition to maintenance Keytruda.  Return to clinic in 3 weeks for further evaluation and consideration of cycle 1.   2.  Shoulder pain: Resolved, but patient now has back pain.  Appreciate palliative care input.  Patient has an appointment with orthopedics next week. 3.  Weight loss: Weight has stabilized.  Appreciate dietary input. 4.  Hyponatremia: Mildly improved.  Patient's sodium is 128 today. 5.  Leukocytosis: Resolved. 6.  Anemia: Mildly improved.  Patient's hemoglobin is trended up slightly to 8.7. 7.  Transaminitis: Resolved. 8.  Hypokalemia: Patient will receive 40 mEq IV potassium today.  Patient expressed understanding and was in agreement with this plan. He also understands that He can call clinic at any time with any questions, concerns, or complaints.   Cancer Staging Non-small cell cancer of left lung [facility]) Staging form: Lung, AJCC 8th Edition - Clinical stage from 10/28/2020: Stage IVA (cT4, cN0, pM1a) - Signed by Lloyd Huger, MD on 10/28/2020  Lloyd Huger, MD   01/12/2021 10:05 AM

## 2021-01-08 ENCOUNTER — Other Ambulatory Visit: Payer: Self-pay

## 2021-01-08 ENCOUNTER — Emergency Department
Admission: EM | Admit: 2021-01-08 | Discharge: 2021-01-08 | Disposition: A | Discharge status: Home / Self Care | Payer: Medicare HMO | Attending: Emergency Medicine | Admitting: Emergency Medicine

## 2021-01-08 DIAGNOSIS — I10 Essential (primary) hypertension: Secondary | ICD-10-CM | POA: Insufficient documentation

## 2021-01-08 DIAGNOSIS — M545 Low back pain, unspecified: Secondary | ICD-10-CM | POA: Diagnosis present

## 2021-01-08 DIAGNOSIS — E119 Type 2 diabetes mellitus without complications: Secondary | ICD-10-CM | POA: Diagnosis not present

## 2021-01-08 DIAGNOSIS — Z7984 Long term (current) use of oral hypoglycemic drugs: Secondary | ICD-10-CM | POA: Diagnosis not present

## 2021-01-08 DIAGNOSIS — Z79899 Other long term (current) drug therapy: Secondary | ICD-10-CM | POA: Diagnosis not present

## 2021-01-08 DIAGNOSIS — G893 Neoplasm related pain (acute) (chronic): Secondary | ICD-10-CM | POA: Insufficient documentation

## 2021-01-08 DIAGNOSIS — I2510 Atherosclerotic heart disease of native coronary artery without angina pectoris: Secondary | ICD-10-CM | POA: Diagnosis not present

## 2021-01-08 DIAGNOSIS — C3492 Malignant neoplasm of unspecified part of left bronchus or lung: Secondary | ICD-10-CM | POA: Insufficient documentation

## 2021-01-08 DIAGNOSIS — Z87891 Personal history of nicotine dependence: Secondary | ICD-10-CM | POA: Insufficient documentation

## 2021-01-08 DIAGNOSIS — C7951 Secondary malignant neoplasm of bone: Secondary | ICD-10-CM | POA: Insufficient documentation

## 2021-01-08 DIAGNOSIS — Z7982 Long term (current) use of aspirin: Secondary | ICD-10-CM | POA: Insufficient documentation

## 2021-01-08 MED ORDER — OXYCODONE HCL 5 MG PO TABS
10.0000 mg | ORAL_TABLET | Freq: Once | ORAL | Status: AC
  Administered 2021-01-08: 10 mg via ORAL
  Filled 2021-01-08: qty 2

## 2021-01-08 MED ORDER — LIDOCAINE 5 % EX PTCH: 1.0000 | MEDICATED_PATCH | Freq: Two times a day (BID) | CUTANEOUS | 0 refills | Status: DC

## 2021-01-08 MED ORDER — ONDANSETRON HCL 4 MG/2ML IJ SOLN
4.0000 mg | Freq: Once | INTRAMUSCULAR | Status: AC
Start: 2021-01-08 — End: 2021-01-08
  Administered 2021-01-08: 4 mg via INTRAVENOUS
  Filled 2021-01-08: qty 2

## 2021-01-08 MED ORDER — FENTANYL CITRATE PF 50 MCG/ML IJ SOSY
50.0000 ug | PREFILLED_SYRINGE | Freq: Once | INTRAMUSCULAR | Status: AC
  Administered 2021-01-08: 50 ug via INTRAVENOUS
  Filled 2021-01-08: qty 1

## 2021-01-08 MED ORDER — HYDROMORPHONE HCL 1 MG/ML IJ SOLN
1.0000 mg | Freq: Once | INTRAMUSCULAR | Status: AC
  Administered 2021-01-08: 1 mg via INTRAVENOUS
  Filled 2021-01-08: qty 1

## 2021-01-08 MED ORDER — LIDOCAINE 5 % EX PTCH
1.0000 | MEDICATED_PATCH | CUTANEOUS | Status: DC
  Administered 2021-01-08: 1 via TRANSDERMAL
  Filled 2021-01-08: qty 1

## 2021-01-08 NOTE — ED Triage Notes (Signed)
Pt to ED POV for lower back pain for past few days, hx of back pain. Pt wearing back brace. States he needs pain shot. Denies injury

## 2021-01-08 NOTE — ED Notes (Addendum)
Rn to bedside. Pt has lung cancer. Lung cancer prescribed muscle relaxer and prednisone for his back pain last week. Pt had some sort of scan as well. Pt is alert and oriented and in no acute distress. Pt keeps asking for "a shot. I am miserable. Please give me a shot".

## 2021-01-08 NOTE — ED Provider Notes (Signed)
[facility] Emergency Department Provider Note  ____________________________________________   Event Date/Time   First MD Initiated Contact with Patient 01/08/21 1212     (approximate)  I have reviewed the triage vital signs and the nursing notes.   HISTORY  Chief Complaint Back Pain    HPI Donald Lowery is a 70 y.o. male with history of lung CVA and recent metastatic lesion noted to the left iliac crest per old records, presents emergency department with severe low back pain.  Patient states he has been in pain for about 3 weeks.  States Dr. Gary Fleet office gave him steroids and muscle relaxer.  Is taking his oxycodone 10 mg as prescribed but is not helping with the pain.  He denies any fever or chills.  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 04/2018 NSTEMI/Cath: LM nl, LAD 50p, 15m/d diffuse-small caliber, LCX heavily Ca2+ sev prox/mid dzs, RCA 90 diff distal dzs into RPL and RPDA. EF 50-55%-->Med Rx.   Hepatic steatosis    History of 2019 novel coronavirus disease (COVID-19) 10/12/2020   History of echocardiogram    a. 04/2018 Echo: EF 55-60%, no rwma, mild MR, mildly dil LA. Nl RV fxn.   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 Quit 04/2018.    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   Cancer related pain 09/21/2020   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/21/2020   Type 2 diabetes mellitus without complication, without long-term current use of insulin ([place]) 05/31/2020   Aortic atherosclerosis ([place]) 02/10/2019   CAD (coronary artery disease) 05/16/2018   NSTEMI (non-ST elevated myocardial infarction) ([place]) 05/15/2018   Emphysema of lung ([place]) 05/15/2018    Past Surgical History:  Procedure Laterality Date   BRAIN SURGERY     CARDIAC CATHETERIZATION     IR IMAGING GUIDED PORT INSERTION  10/28/2020   LEFT HEART CATH AND CORONARY ANGIOGRAPHY N/A 05/16/2018   Procedure: LEFT HEART CATH AND CORONARY ANGIOGRAPHY poss PCI;  Surgeon: [name], MD;  Location: [place] [facility];  Service: Cardiovascular;  Laterality: N/A;   VIDEO BRONCHOSCOPY WITH ENDOBRONCHIAL NAVIGATION N/A 10/24/2020   Procedure: ROBOTIC ASSISTED VIDEO BRONCHOSCOPY WITH ENDOBRONCHIAL NAVIGATION;  Surgeon: Tyler Pita, MD;  Location: [facility] ORS;  Service: Pulmonary;  Laterality: N/A;    Prior to Admission medications   Medication Sig Start Date End Date Taking? Authorizing Provider  lidocaine (LIDODERM) 5 % Place 1 patch onto the skin every 12 (twelve) hours. Remove & Discard patch within 12 hours or as directed by MD 01/08/21 01/08/22 Yes Caryn Section Linden Dolin, PA-C  aspirin EC 81 MG EC tablet Take 1 tablet (81 mg total) by mouth daily. 05/17/18   Hillary Bow, MD  atorvastatin (LIPITOR) 80 MG tablet Take 1 tablet (80 mg total) by mouth daily at 6 PM. 12/26/20   Gollan, Kathlene November, MD  cyclobenzaprine (FLEXERIL) 5 MG tablet Take 1 tablet (5 mg total) by mouth 3 (three) times daily as needed for muscle spasms. 01/03/21   Borders, [name], NP  dicyclomine (BENTYL) 10 MG capsule Take 1 capsule (10 mg total) by mouth 3 (three) times daily as needed for up to 14 days for spasms. 09/17/20 12/21/20  Harvest Dark, MD  ezetimibe (ZETIA) 10 MG tablet  Take 1 tablet (10 mg total) by mouth daily. 12/26/20 03/26/21  [name], MD  icosapent Ethyl (VASCEPA) 1 g capsule Take 2 capsules (2 g total) by mouth 2 (two) times daily. 10/21/20 12/21/20  [name], MD  isosorbide mononitrate (IMDUR) 30 MG 24 hr tablet Take 1 tablet (30 mg total) by mouth daily. 12/26/20   [name], MD  lidocaine-prilocaine (EMLA) cream Apply to affected area once 10/28/20   Lloyd Huger, MD  losartan (COZAAR) 25 MG tablet Take 1 tablet (25 mg total) by mouth daily. 12/26/20   [name], MD  Menthol-Methyl Salicylate (ICY HOT) 06-23 % STCK Apply 1 application topically daily as needed (shoulder pain).    [provider]  metFORMIN (GLUCOPHAGE) 500 MG tablet Take 500 mg by mouth every morning. 08/27/20   [provider]  morphine (MS CONTIN) 15 MG 12 hr tablet Take 2 tablets (30 mg total) by mouth every 12 (twelve) hours. Patient not taking: Reported on 01/04/2021 12/08/20   Borders, [name], NP  naloxone [facility]) nasal spray 4 mg/0.1 mL SPRAY 1 SPRAY INTO ONE NOSTRIL AS DIRECTED FOR OPIOID OVERDOSE (TURN PERSON ON SIDE AFTER DOSE. IF NO RESPONSE IN 2-3 MINUTES OR PERSON RESPONDS BUT RELAPSES, REPEAT USING A NEW SPRAY DEVICE AND SPRAY INTO THE OTHER NOSTRIL. CALL 911 AFTER USE.) * EMERGENCY USE ONLY * Patient not taking: No sig reported 11/22/20   Borders, [name], NP  nitroGLYCERIN (NITROSTAT) 0.4 MG SL tablet Place 1 tablet (0.4 mg total) under the tongue every 5 (five) minutes as needed for chest pain. Patient not taking: Reported on 01/04/2021 12/26/20 03/26/21  [name], MD  ondansetron (ZOFRAN) 8 MG tablet Take 1 tablet (8 mg total) by mouth 2 (two) times daily as needed for refractory nausea / vomiting. 10/28/20   Lloyd Huger, MD  Oxycodone HCl 10 MG TABS Take 1 tablet (10 mg total) by mouth every 4 (four) hours as needed. Patient not taking: Reported on 01/04/2021 12/14/20   Lloyd Huger, MD  predniSONE  (STERAPRED UNI-PAK 21 TAB) 10 MG (21) TBPK tablet Take 6 tablets (60mg ) x 1 day, then take 5 tablets (50mg ) x 1 day, then take 4 tablets (40mg ) x 1 day, then take 3 tablets (30mg ) x 1 day, then take 2 tablets (20mg ) x 1 day, then take 1 tablet (10mg ) x 1 day, then stop 01/03/21   Borders, [name], NP  prochlorperazine (COMPAZINE) 10 MG tablet Take 1 tablet (10 mg total) by mouth every 6 (six) hours as needed (Nausea or vomiting). Patient not taking: Reported on 01/04/2021 10/28/20   Lloyd Huger, MD  sucralfate (CARAFATE) 1 g tablet Take 0.5 tablets (0.5 g total) by mouth 3 (three) times daily before meals. 11/15/20   Noreene Filbert, MD  vitamin B-12 (CYANOCOBALAMIN) 1000 MCG tablet Take 1,000 mcg by mouth daily.    [provider]  Allergies Patient has no known allergies.  Family History  Problem Relation Age of Onset   Heart Problems Mother    Heart attack Father    Diabetes Brother    Heart Problems Brother     Social History Social History   Tobacco Use   Smoking status: Former    Packs/day: 1.00    Years: 45.00    Pack years: 45.00    Types: Cigarettes    Quit date: 05/15/2018    Years since quitting: 2.6   Smokeless tobacco: Never  Vaping Use   Vaping Use: Never used  Substance Use Topics   Alcohol use: Never   Drug use: Never    Review of Systems  Constitutional: No fever/chills Eyes: No visual changes. ENT: No sore throat. Respiratory: Denies cough Cardiovascular: Denies chest pain Gastrointestinal: Denies abdominal pain Genitourinary: Negative for dysuria. Musculoskeletal: Positive for back pain. Skin: Negative for rash. Psychiatric: no mood changes,     ____________________________________________   PHYSICAL EXAM:  VITAL SIGNS: ED Triage Vitals [01/08/21 1120]  Enc Vitals Group     BP 116/79     Pulse Rate 62     Resp 20     Temp 98.5 F (36.9 C)     Temp Source Oral     SpO2 95 %     Weight 116 lb 13.5 oz (53 kg)     Height  5\' 4"  (1.626 m)     Head Circumference      Peak Flow      Pain Score 10     Pain Loc      Pain Edu?      Excl. in [place]?     Constitutional: Alert and oriented. Well appearing and in no acute distress. Eyes: Conjunctivae are normal.  Head: Atraumatic. Nose: No congestion/rhinnorhea. Mouth/Throat: Mucous membranes are moist.   Neck:  supple no lymphadenopathy noted Cardiovascular: Normal rate, regular rhythm. Heart sounds are normal Respiratory: Normal respiratory effort.  No retractions, lungs c t a  Abd: soft nontender bs normal all 4 quad GU: deferred Musculoskeletal: FROM all extremities, warm and well perfused, lumbar spines minimally tender, left iliac crest is tender, SI joints tender bilaterally Neurologic:  Normal speech and language.  Skin:  Skin is warm, dry and intact. No rash noted. Psychiatric: Mood and affect are normal. Speech and behavior are normal.  ____________________________________________   LABS (all labs ordered are listed, but only abnormal results are displayed)  Labs Reviewed - No data to display ____________________________________________   ____________________________________________  RADIOLOGY    ____________________________________________   PROCEDURES  Procedure(s) performed: No  Procedures    ____________________________________________   INITIAL IMPRESSION / ASSESSMENT AND PLAN / ED COURSE  Pertinent labs & imaging results that were available during my care of the patient were reviewed by me and considered in my medical decision making (see chart for details).   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  See HPI.  PET scan per old record shows a lytic lesion on the left iliac crest.  This was performed on 12/12/2020.  Physical exam shows patient to be tearful and in a large amount of pain.  Dilaudid 1 mg IV, Zofran 4 mg IV, fentanyl 50  mcg  Patient had relief with the pain medications.  States she still hurts down the bone.  I did explain to him that with metastatic bone cancer he may always have  some sort of pain in that area.  I advised him to follow-up with Dr. Grayland Ormond and ask if he needs to give him medication for breakthrough pain.  Also gave him prescription for lidocaine patches.  He was given a dose of his regular oxycodone prior to discharge.  Strict instructions to return if worsening     Donald Lowery was evaluated in Emergency Department on 01/08/2021 for the symptoms described in the history of present illness. He was evaluated in the context of the global COVID-19 pandemic, which necessitated consideration that the patient might be at risk for infection with the SARS-CoV-2 virus that causes COVID-19. Institutional protocols and algorithms that pertain to the evaluation of patients at risk for COVID-19 are in a state of rapid change based on information released by regulatory bodies including the CDC and federal and state organizations. These policies and algorithms were followed during the patient's care in the ED.    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 Remove & Discard patch within 12 hours or as directed by MD     Note:  This document was prepared using Dragon voice recognition software and may include unintentional dictation errors.    Versie Starks,  PA-C 01/08/21 1440    Rada Hay, MD 01/08/21 [phone]

## 2021-01-09 ENCOUNTER — Ambulatory Visit
Admission: RE | Admit: 2021-01-09 | Discharge: 2021-01-09 | Disposition: A | Discharge status: Home / Self Care | Payer: Medicare HMO | Source: Ambulatory Visit | Attending: Radiation Oncology | Admitting: Radiation Oncology

## 2021-01-09 DIAGNOSIS — Z510 Encounter for antineoplastic radiation therapy: Secondary | ICD-10-CM | POA: Diagnosis not present

## 2021-01-10 ENCOUNTER — Ambulatory Visit
Admission: RE | Admit: 2021-01-10 | Discharge: 2021-01-10 | Disposition: A | Discharge status: Home / Self Care | Payer: Medicare HMO | Source: Ambulatory Visit | Attending: Radiation Oncology | Admitting: Radiation Oncology

## 2021-01-10 DIAGNOSIS — C7951 Secondary malignant neoplasm of bone: Secondary | ICD-10-CM | POA: Diagnosis not present

## 2021-01-10 DIAGNOSIS — R52 Pain, unspecified: Secondary | ICD-10-CM | POA: Diagnosis not present

## 2021-01-11 ENCOUNTER — Ambulatory Visit: Payer: Medicare HMO

## 2021-01-11 ENCOUNTER — Inpatient Hospital Stay (HOSPITAL_BASED_OUTPATIENT_CLINIC_OR_DEPARTMENT_OTHER): Payer: Medicare HMO | Admitting: Oncology

## 2021-01-11 ENCOUNTER — Ambulatory Visit
Admission: RE | Admit: 2021-01-11 | Discharge: 2021-01-11 | Disposition: A | Discharge status: Home / Self Care | Payer: Medicare HMO | Source: Ambulatory Visit | Attending: Radiation Oncology | Admitting: Radiation Oncology

## 2021-01-11 ENCOUNTER — Encounter: Payer: Self-pay | Admitting: Oncology

## 2021-01-11 ENCOUNTER — Inpatient Hospital Stay: Payer: Medicare HMO

## 2021-01-11 ENCOUNTER — Other Ambulatory Visit: Payer: Self-pay

## 2021-01-11 VITALS — BP 100/65 | HR 111 | Temp 96.7°F | Resp 16 | Wt 117.4 lb

## 2021-01-11 DIAGNOSIS — C3492 Malignant neoplasm of unspecified part of left bronchus or lung: Secondary | ICD-10-CM

## 2021-01-11 DIAGNOSIS — M549 Dorsalgia, unspecified: Secondary | ICD-10-CM | POA: Diagnosis not present

## 2021-01-11 DIAGNOSIS — Z95828 Presence of other vascular implants and grafts: Secondary | ICD-10-CM

## 2021-01-11 LAB — CBC WITH DIFFERENTIAL/PLATELET
Abs Immature Granulocytes: 0.11 10*3/uL — ABNORMAL HIGH (ref 0.00–0.07)
Basophils Absolute: 0 10*3/uL (ref 0.0–0.1)
Basophils Relative: 0 %
Eosinophils Absolute: 0 10*3/uL (ref 0.0–0.5)
Eosinophils Relative: 0 %
HCT: 26 % — ABNORMAL LOW (ref 39.0–52.0)
Hemoglobin: 8.7 g/dL — ABNORMAL LOW (ref 13.0–17.0)
Immature Granulocytes: 2 %
Lymphocytes Relative: 6 %
Lymphs Abs: 0.3 10*3/uL — ABNORMAL LOW (ref 0.7–4.0)
MCH: 29.1 pg (ref 26.0–34.0)
MCHC: 33.5 g/dL (ref 30.0–36.0)
MCV: 87 fL (ref 80.0–100.0)
Monocytes Absolute: 0.4 10*3/uL (ref 0.1–1.0)
Monocytes Relative: 8 %
Neutro Abs: 4.3 10*3/uL (ref 1.7–7.7)
Neutrophils Relative %: 84 %
Platelets: 280 10*3/uL (ref 150–400)
RBC: 2.99 MIL/uL — ABNORMAL LOW (ref 4.22–5.81)
RDW: 18.5 % — ABNORMAL HIGH (ref 11.5–15.5)
WBC: 5.2 10*3/uL (ref 4.0–10.5)
nRBC: 0 % (ref 0.0–0.2)

## 2021-01-11 LAB — COMPREHENSIVE METABOLIC PANEL
ALT: 19 U/L (ref 0–44)
AST: 16 U/L (ref 15–41)
Albumin: 3 g/dL — ABNORMAL LOW (ref 3.5–5.0)
Alkaline Phosphatase: 158 U/L — ABNORMAL HIGH (ref 38–126)
Anion gap: 8 (ref 5–15)
BUN: 20 mg/dL (ref 8–23)
CO2: 30 mmol/L (ref 22–32)
Calcium: 8.8 mg/dL — ABNORMAL LOW (ref 8.9–10.3)
Chloride: 90 mmol/L — ABNORMAL LOW (ref 98–111)
Creatinine, Ser: 0.81 mg/dL (ref 0.61–1.24)
GFR, Estimated: >60 mL/min (ref >60)
Glucose, Bld: 188 mg/dL — ABNORMAL HIGH (ref 70–99)
Potassium: 3.1 mmol/L — ABNORMAL LOW (ref 3.5–5.1)
Sodium: 128 mmol/L — ABNORMAL LOW (ref 135–145)
Total Bilirubin: 1 mg/dL (ref 0.3–1.2)
Total Protein: 6.4 g/dL — ABNORMAL LOW (ref 6.5–8.1)

## 2021-01-11 MED ORDER — SODIUM CHLORIDE 0.9 % IV SOLN
10.0000 mg | Freq: Once | INTRAVENOUS | Status: AC
  Administered 2021-01-11: 10 mg via INTRAVENOUS
  Filled 2021-01-11: qty 10

## 2021-01-11 MED ORDER — CYCLOBENZAPRINE HCL 5 MG PO TABS: 5.0000 mg | ORAL_TABLET | Freq: Three times a day (TID) | ORAL | 0 refills | Status: DC | PRN

## 2021-01-11 MED ORDER — SODIUM CHLORIDE 0.9 % IV SOLN
40.0000 meq | Freq: Once | INTRAVENOUS | Status: AC
  Administered 2021-01-11: 40 meq via INTRAVENOUS
  Filled 2021-01-11: qty 20

## 2021-01-11 MED ORDER — SODIUM CHLORIDE 0.9% FLUSH
10.0000 mL | Freq: Once | INTRAVENOUS | Status: AC
  Administered 2021-01-11: 10 mL via INTRAVENOUS
  Filled 2021-01-11: qty 10

## 2021-01-11 MED ORDER — HEPARIN SOD (PORK) LOCK FLUSH 100 UNIT/ML IV SOLN
INTRAVENOUS | Status: AC
  Administered 2021-01-11: 500 [IU]
  Filled 2021-01-11: qty 5

## 2021-01-11 MED ORDER — SODIUM CHLORIDE 0.9 % IV SOLN
Freq: Once | INTRAVENOUS | Status: AC
  Filled 2021-01-11: qty 250

## 2021-01-11 MED ORDER — SODIUM CHLORIDE 0.9 % IV SOLN
45.0000 mg/m2 | Freq: Once | INTRAVENOUS | Status: AC
  Administered 2021-01-11: 78 mg via INTRAVENOUS
  Filled 2021-01-11: qty 13

## 2021-01-11 MED ORDER — MORPHINE SULFATE ER 15 MG PO TBCR: 30.0000 mg | EXTENDED_RELEASE_TABLET | Freq: Two times a day (BID) | ORAL | 0 refills | Status: DC

## 2021-01-11 MED ORDER — OXYCODONE HCL 10 MG PO TABS: 10.0000 mg | ORAL_TABLET | ORAL | 0 refills | Status: DC | PRN

## 2021-01-11 MED ORDER — SODIUM CHLORIDE 0.9% FLUSH
10.0000 mL | INTRAVENOUS | Status: DC | PRN
  Administered 2021-01-11 (×2): 10 mL
  Filled 2021-01-11: qty 10

## 2021-01-11 MED ORDER — PALONOSETRON HCL INJECTION 0.25 MG/5ML
0.2500 mg | Freq: Once | INTRAVENOUS | Status: AC
  Administered 2021-01-11: 0.25 mg via INTRAVENOUS
  Filled 2021-01-11: qty 5

## 2021-01-11 MED ORDER — MORPHINE SULFATE (PF) 2 MG/ML IV SOLN
2.0000 mg | Freq: Once | INTRAVENOUS | Status: AC
  Administered 2021-01-11: 2 mg via INTRAVENOUS
  Filled 2021-01-11: qty 1

## 2021-01-11 MED ORDER — HEPARIN SOD (PORK) LOCK FLUSH 100 UNIT/ML IV SOLN
500.0000 [IU] | Freq: Once | INTRAVENOUS | Status: DC
  Filled 2021-01-11: qty 5

## 2021-01-11 MED ORDER — SODIUM CHLORIDE 0.9 % IV SOLN
160.0000 mg | Freq: Once | INTRAVENOUS | Status: AC
  Administered 2021-01-11: 160 mg via INTRAVENOUS
  Filled 2021-01-11: qty 16

## 2021-01-11 MED ORDER — FAMOTIDINE 20 MG IN NS 100 ML IVPB
20.0000 mg | Freq: Once | INTRAVENOUS | Status: AC
  Administered 2021-01-11: 20 mg via INTRAVENOUS
  Filled 2021-01-11: qty 20

## 2021-01-11 MED ORDER — HEPARIN SOD (PORK) LOCK FLUSH 100 UNIT/ML IV SOLN
500.0000 [IU] | Freq: Once | INTRAVENOUS | Status: AC | PRN
  Filled 2021-01-11: qty 5

## 2021-01-11 MED ORDER — DIPHENHYDRAMINE HCL 50 MG/ML IJ SOLN
25.0000 mg | Freq: Once | INTRAMUSCULAR | Status: AC
  Administered 2021-01-11: 25 mg via INTRAVENOUS
  Filled 2021-01-11: qty 1

## 2021-01-11 NOTE — Progress Notes (Addendum)
OK to treat with HR 111 per Dr. Grayland Ormond. Patient reports 10/10 back pain with minimal relief from home pain medications taken at 7:00am. Dr. Grayland Ormond notified. Repositioned on pillows for comfort during treatment. Morphine 2mg  IV given per orders. Modest decrease in pain to 8/10. Tolerated infusions well. Discharged in stable condition.

## 2021-01-11 NOTE — Patient Instructions (Signed)
[facility]  Discharge Instructions: Thank you for choosing [facility] to provide your oncology and hematology care.  If you have a lab appointment with the Cancer Center, please go directly to the Cancer Center and check in at the registration area.  Wear comfortable clothing and clothing appropriate for easy access to any Portacath or PICC line.   We strive to give you quality time with your provider. You may need to reschedule your appointment if you arrive late (15 or more minutes).  Arriving late affects you and other patients whose appointments are after yours.  Also, if you miss three or more appointments without notifying the office, you may be dismissed from the clinic at the provider's discretion.      For prescription refill requests, have your pharmacy contact our office and allow 72 hours for refills to be completed.    Today you received the following chemotherapy and/or immunotherapy agents - paclitaxel, carboplatin      To help prevent nausea and vomiting after your treatment, we encourage you to take your nausea medication as directed.  BELOW ARE SYMPTOMS THAT SHOULD BE REPORTED IMMEDIATELY: *FEVER GREATER THAN 100.4 F (38 C) OR HIGHER *CHILLS OR SWEATING *NAUSEA AND VOMITING THAT IS NOT CONTROLLED WITH YOUR NAUSEA MEDICATION *UNUSUAL SHORTNESS OF BREATH *UNUSUAL BRUISING OR BLEEDING *URINARY PROBLEMS (pain or burning when urinating, or frequent urination) *BOWEL PROBLEMS (unusual diarrhea, constipation, pain near the anus) TENDERNESS IN MOUTH AND THROAT WITH OR WITHOUT PRESENCE OF ULCERS (sore throat, sores in mouth, or a toothache) UNUSUAL RASH, SWELLING OR PAIN  UNUSUAL VAGINAL DISCHARGE OR ITCHING   Items with * indicate a potential emergency and should be followed up as soon as possible or go to the Emergency Department if any problems should occur.  Please show the CHEMOTHERAPY ALERT CARD or IMMUNOTHERAPY ALERT  CARD at check-in to the Emergency Department and triage nurse.  Should you have questions after your visit or need to cancel or reschedule your appointment, please contact [facility]  [phone] and follow the prompts.  Office hours are 8:00 a.m. to 4:30 p.m. Monday - Friday. Please note that voicemails left after 4:00 p.m. may not be returned until the following business day.  We are closed weekends and major holidays. You have access to a nurse at all times for urgent questions. Please call the main number to the clinic [phone] and follow the prompts.  For any non-urgent questions, you may also contact your provider using MyChart. We now offer e-Visits for anyone 18 and older to request care online for non-urgent symptoms. For details visit [email or web address].   Also download the MyChart app! Go to the app store, search "MyChart", open the app, select [facility], and log in with your MyChart username and password.  Due to Covid, a mask is required upon entering the hospital/clinic. If you do not have a mask, one will be given to you upon arrival. For doctor visits, patients may have 1 support person aged 18 or older with them. For treatment visits, patients cannot have anyone with them due to current Covid guidelines and our immunocompromised population.   Paclitaxel injection What is this medication? PACLITAXEL (PAK li TAX el) is a chemotherapy drug. It targets fast dividing cells, like cancer cells, and causes these cells to die. This medicine is used to treat ovarian cancer, breast cancer, lung cancer, Kaposi's sarcoma, andother cancers. This medicine may be used for other purposes;   ask your health care provider orpharmacist if you have questions. COMMON BRAND NAME(S): Onxol, Taxol What should I tell my care team before I take this medication? They need to know if you have any of these conditions: history of irregular heartbeat liver  disease low blood counts, like low white cell, platelet, or red cell counts lung or breathing disease, like asthma tingling of the fingers or toes, or other nerve disorder an unusual or allergic reaction to paclitaxel, alcohol, polyoxyethylated castor oil, other chemotherapy, other medicines, foods, dyes, or preservatives pregnant or trying to get pregnant breast-feeding How should I use this medication? This drug is given as an infusion into a vein. It is administered in a hospitalor clinic by a specially trained health care professional. Talk to your pediatrician regarding the use of this medicine in children.Special care may be needed. Overdosage: If you think you have taken too much of this medicine contact apoison control center or emergency room at once. NOTE: This medicine is only for you. Do not share this medicine with others. What if I miss a dose? It is important not to miss your dose. Call your doctor or health careprofessional if you are unable to keep an appointment. What may interact with this medication? Do not take this medicine with any of the following medications: live virus vaccines This medicine may also interact with the following medications: antiviral medicines for hepatitis, HIV or AIDS certain antibiotics like erythromycin and clarithromycin certain medicines for fungal infections like ketoconazole and itraconazole certain medicines for seizures like carbamazepine, phenobarbital, phenytoin gemfibrozil nefazodone rifampin St. John's wort This list may not describe all possible interactions. Give your health care provider a list of all the medicines, herbs, non-prescription drugs, or dietary supplements you use. Also tell them if you smoke, drink alcohol, or use illegaldrugs. Some items may interact with your medicine. What should I watch for while using this medication? Your condition will be monitored carefully while you are receiving this medicine. You will  need important blood work done while you are taking thismedicine. This medicine can cause serious allergic reactions. To reduce your risk you will need to take other medicine(s) before treatment with this medicine. If you experience allergic reactions like skin rash, itching or hives, swelling of theface, lips, or tongue, tell your doctor or health care professional right away. In some cases, you may be given additional medicines to help with side effects.Follow all directions for their use. This drug may make you feel generally unwell. This is not uncommon, as chemotherapy can affect healthy cells as well as cancer cells. Report any side effects. Continue your course of treatment even though you feel ill unless yourdoctor tells you to stop. Call your doctor or health care professional for advice if you get a fever, chills or sore throat, or other symptoms of a cold or flu. Do not treat yourself. This drug decreases your body's ability to fight infections. Try toavoid being around people who are sick. This medicine may increase your risk to bruise or bleed. Call your doctor orhealth care professional if you notice any unusual bleeding. Be careful brushing and flossing your teeth or using a toothpick because you may get an infection or bleed more easily. If you have any dental work done,tell your dentist you are receiving this medicine. Avoid taking products that contain aspirin, acetaminophen, ibuprofen, naproxen, or ketoprofen unless instructed by your doctor. These medicines may hide afever. Do not become pregnant while taking this medicine. Women should inform their   doctor if they wish to become pregnant or think they might be pregnant. There is a potential for serious side effects to an unborn child. Talk to your health care professional or pharmacist for more information. Do not breast-feed aninfant while taking this medicine. Men are advised not to father a child while receiving this medicine. This  product may contain alcohol. Ask your pharmacist or healthcare provider if this medicine contains alcohol. Be sure to tell all healthcare providers you are taking this medicine. Certain medicines, like metronidazole and disulfiram, can cause an unpleasant reaction when taken with alcohol. The reaction includes flushing, headache, nausea, vomiting, sweating, and increased thirst. Thereaction can last from 30 minutes to several hours. What side effects may I notice from receiving this medication? Side effects that you should report to your doctor or health care professionalas soon as possible: allergic reactions like skin rash, itching or hives, swelling of the face, lips, or tongue breathing problems changes in vision fast, irregular heartbeat high or low blood pressure mouth sores pain, tingling, numbness in the hands or feet signs of decreased platelets or bleeding - bruising, pinpoint red spots on the skin, black, tarry stools, blood in the urine signs of decreased red blood cells - unusually weak or tired, feeling faint or lightheaded, falls signs of infection - fever or chills, cough, sore throat, pain or difficulty passing urine signs and symptoms of liver injury like dark yellow or brown urine; general ill feeling or flu-like symptoms; light-colored stools; loss of appetite; nausea; right upper belly pain; unusually weak or tired; yellowing of the eyes or skin swelling of the ankles, feet, hands unusually slow heartbeat Side effects that usually do not require medical attention (report to yourdoctor or health care professional if they continue or are bothersome): diarrhea hair loss loss of appetite muscle or joint pain nausea, vomiting pain, redness, or irritation at site where injected tiredness This list may not describe all possible side effects. Call your doctor for medical advice about side effects. You may report side effects to FDA at1-800-FDA-1088. Where should I keep my  medication? This drug is given in a hospital or clinic and will not be stored at home. NOTE: This sheet is a summary. It may not cover all possible information. If you have questions about this medicine, talk to your doctor, pharmacist, orhealth care provider.  2022 Elsevier/Gold Standard (2019-03-18 13:37:23)  Carboplatin injection What is this medication? CARBOPLATIN (KAR boe pla tin) is a chemotherapy drug. It targets fast dividing cells, like cancer cells, and causes these cells to die. This medicine is usedto treat ovarian cancer and many other cancers. This medicine may be used for other purposes; ask your health care provider orpharmacist if you have questions. COMMON BRAND NAME(S): Paraplatin What should I tell my care team before I take this medication? They need to know if you have any of these conditions: blood disorders hearing problems kidney disease recent or ongoing radiation therapy an unusual or allergic reaction to carboplatin, cisplatin, other chemotherapy, other medicines, foods, dyes, or preservatives pregnant or trying to get pregnant breast-feeding How should I use this medication? This drug is usually given as an infusion into a vein. It is administered in ahospital or clinic by a specially trained health care professional. Talk to your pediatrician regarding the use of this medicine in children.Special care may be needed. Overdosage: If you think you have taken too much of this medicine contact apoison control center or emergency room at once. NOTE: This medicine   is only for you. Do not share this medicine with others. What if I miss a dose? It is important not to miss a dose. Call your doctor or health careprofessional if you are unable to keep an appointment. What may interact with this medication? medicines for seizures medicines to increase blood counts like filgrastim, pegfilgrastim, sargramostim some antibiotics like amikacin, gentamicin, neomycin,  streptomycin, tobramycin vaccines Talk to your doctor or health care professional before taking any of thesemedicines: acetaminophen aspirin ibuprofen ketoprofen naproxen This list may not describe all possible interactions. Give your health care provider a list of all the medicines, herbs, non-prescription drugs, or dietary supplements you use. Also tell them if you smoke, drink alcohol, or use illegaldrugs. Some items may interact with your medicine. What should I watch for while using this medication? Your condition will be monitored carefully while you are receiving this medicine. You will need important blood work done while you are taking thismedicine. This drug may make you feel generally unwell. This is not uncommon, as chemotherapy can affect healthy cells as well as cancer cells. Report any side effects. Continue your course of treatment even though you feel ill unless yourdoctor tells you to stop. In some cases, you may be given additional medicines to help with side effects.Follow all directions for their use. Call your doctor or health care professional for advice if you get a fever, chills or sore throat, or other symptoms of a cold or flu. Do not treat yourself. This drug decreases your body's ability to fight infections. Try toavoid being around people who are sick. This medicine may increase your risk to bruise or bleed. Call your doctor orhealth care professional if you notice any unusual bleeding. Be careful brushing and flossing your teeth or using a toothpick because you may get an infection or bleed more easily. If you have any dental work done,tell your dentist you are receiving this medicine. Avoid taking products that contain aspirin, acetaminophen, ibuprofen, naproxen, or ketoprofen unless instructed by your doctor. These medicines may hide afever. Do not become pregnant while taking this medicine. Women should inform their doctor if they wish to become pregnant or think  they might be pregnant. There is a potential for serious side effects to an unborn child. Talk to your health care professional or pharmacist for more information. Do not breast-feed aninfant while taking this medicine. What side effects may I notice from receiving this medication? Side effects that you should report to your doctor or health care professionalas soon as possible: allergic reactions like skin rash, itching or hives, swelling of the face, lips, or tongue signs of infection - fever or chills, cough, sore throat, pain or difficulty passing urine signs of decreased platelets or bleeding - bruising, pinpoint red spots on the skin, black, tarry stools, nosebleeds signs of decreased red blood cells - unusually weak or tired, fainting spells, lightheadedness breathing problems changes in hearing changes in vision chest pain high blood pressure low blood counts - This drug may decrease the number of white blood cells, red blood cells and platelets. You may be at increased risk for infections and bleeding. nausea and vomiting pain, swelling, redness or irritation at the injection site pain, tingling, numbness in the hands or feet problems with balance, talking, walking trouble passing urine or change in the amount of urine Side effects that usually do not require medical attention (report to yourdoctor or health care professional if they continue or are bothersome): hair loss loss of appetite metallic   taste in the mouth or changes in taste This list may not describe all possible side effects. Call your doctor for medical advice about side effects. You may report side effects to FDA at1-800-FDA-1088. Where should I keep my medication? This drug is given in a hospital or clinic and will not be stored at home. NOTE: This sheet is a summary. It may not cover all possible information. If you have questions about this medicine, talk to your doctor, pharmacist, orhealth care provider.  2022  Elsevier/Gold Standard (2007-07-22 14:38:05)  

## 2021-01-11 NOTE — Progress Notes (Signed)
Pt has had some weight loss. Per MD keep Taxol dose the same.

## 2021-01-11 NOTE — Progress Notes (Signed)
Patient blood pressure has been running low with 90/58 so he held bp medication.  Back pain 10/10 today.  The Prednisone taper pak did help relieve the pain.

## 2021-01-12 ENCOUNTER — Ambulatory Visit: Payer: Medicare HMO

## 2021-01-12 ENCOUNTER — Inpatient Hospital Stay
Admission: EM | Admit: 2021-01-12 | Discharge: 2021-01-19 | DRG: 477 | Disposition: A | Discharge status: Home Health | Payer: Medicare HMO | Attending: Internal Medicine | Admitting: Internal Medicine

## 2021-01-12 ENCOUNTER — Ambulatory Visit: Payer: Medicare HMO | Admitting: Radiation Oncology

## 2021-01-12 ENCOUNTER — Encounter: Payer: Self-pay | Admitting: Nurse Practitioner

## 2021-01-12 ENCOUNTER — Telehealth: Payer: Self-pay

## 2021-01-12 ENCOUNTER — Encounter: Payer: Self-pay | Admitting: Oncology

## 2021-01-12 ENCOUNTER — Emergency Department: Payer: Medicare HMO

## 2021-01-12 ENCOUNTER — Other Ambulatory Visit: Payer: Self-pay

## 2021-01-12 DIAGNOSIS — R1310 Dysphagia, unspecified: Secondary | ICD-10-CM | POA: Diagnosis present

## 2021-01-12 DIAGNOSIS — C3492 Malignant neoplasm of unspecified part of left bronchus or lung: Secondary | ICD-10-CM | POA: Diagnosis present

## 2021-01-12 DIAGNOSIS — C7951 Secondary malignant neoplasm of bone: Principal | ICD-10-CM | POA: Diagnosis present

## 2021-01-12 DIAGNOSIS — Z8616 Personal history of COVID-19: Secondary | ICD-10-CM

## 2021-01-12 DIAGNOSIS — J438 Other emphysema: Secondary | ICD-10-CM | POA: Diagnosis present

## 2021-01-12 DIAGNOSIS — I252 Old myocardial infarction: Secondary | ICD-10-CM

## 2021-01-12 DIAGNOSIS — E43 Unspecified severe protein-calorie malnutrition: Secondary | ICD-10-CM | POA: Diagnosis present

## 2021-01-12 DIAGNOSIS — E785 Hyperlipidemia, unspecified: Secondary | ICD-10-CM | POA: Diagnosis present

## 2021-01-12 DIAGNOSIS — R52 Pain, unspecified: Secondary | ICD-10-CM | POA: Diagnosis present

## 2021-01-12 DIAGNOSIS — M549 Dorsalgia, unspecified: Secondary | ICD-10-CM | POA: Diagnosis not present

## 2021-01-12 DIAGNOSIS — G893 Neoplasm related pain (acute) (chronic): Secondary | ICD-10-CM | POA: Diagnosis present

## 2021-01-12 DIAGNOSIS — Z833 Family history of diabetes mellitus: Secondary | ICD-10-CM

## 2021-01-12 DIAGNOSIS — Z515 Encounter for palliative care: Secondary | ICD-10-CM

## 2021-01-12 DIAGNOSIS — Z8249 Family history of ischemic heart disease and other diseases of the circulatory system: Secondary | ICD-10-CM

## 2021-01-12 DIAGNOSIS — J3801 Paralysis of vocal cords and larynx, unilateral: Secondary | ICD-10-CM | POA: Diagnosis present

## 2021-01-12 DIAGNOSIS — I700 Atherosclerosis of aorta: Secondary | ICD-10-CM | POA: Diagnosis present

## 2021-01-12 DIAGNOSIS — I10 Essential (primary) hypertension: Secondary | ICD-10-CM | POA: Diagnosis present

## 2021-01-12 DIAGNOSIS — S32030A Wedge compression fracture of third lumbar vertebra, initial encounter for closed fracture: Secondary | ICD-10-CM | POA: Diagnosis present

## 2021-01-12 DIAGNOSIS — Z923 Personal history of irradiation: Secondary | ICD-10-CM

## 2021-01-12 DIAGNOSIS — G8929 Other chronic pain: Secondary | ICD-10-CM

## 2021-01-12 DIAGNOSIS — M5441 Lumbago with sciatica, right side: Secondary | ICD-10-CM | POA: Diagnosis present

## 2021-01-12 DIAGNOSIS — M5442 Lumbago with sciatica, left side: Secondary | ICD-10-CM | POA: Diagnosis present

## 2021-01-12 DIAGNOSIS — L89152 Pressure ulcer of sacral region, stage 2: Secondary | ICD-10-CM | POA: Diagnosis present

## 2021-01-12 DIAGNOSIS — I2510 Atherosclerotic heart disease of native coronary artery without angina pectoris: Secondary | ICD-10-CM | POA: Diagnosis present

## 2021-01-12 DIAGNOSIS — Z87891 Personal history of nicotine dependence: Secondary | ICD-10-CM

## 2021-01-12 DIAGNOSIS — E876 Hypokalemia: Secondary | ICD-10-CM | POA: Diagnosis present

## 2021-01-12 DIAGNOSIS — Z7984 Long term (current) use of oral hypoglycemic drugs: Secondary | ICD-10-CM

## 2021-01-12 DIAGNOSIS — Z20822 Contact with and (suspected) exposure to covid-19: Secondary | ICD-10-CM | POA: Diagnosis present

## 2021-01-12 DIAGNOSIS — E119 Type 2 diabetes mellitus without complications: Secondary | ICD-10-CM | POA: Diagnosis present

## 2021-01-12 DIAGNOSIS — E222 Syndrome of inappropriate secretion of antidiuretic hormone: Secondary | ICD-10-CM | POA: Diagnosis present

## 2021-01-12 DIAGNOSIS — Z79899 Other long term (current) drug therapy: Secondary | ICD-10-CM

## 2021-01-12 DIAGNOSIS — Z79891 Long term (current) use of opiate analgesic: Secondary | ICD-10-CM

## 2021-01-12 DIAGNOSIS — M5459 Other low back pain: Secondary | ICD-10-CM

## 2021-01-12 DIAGNOSIS — K760 Fatty (change of) liver, not elsewhere classified: Secondary | ICD-10-CM | POA: Diagnosis present

## 2021-01-12 DIAGNOSIS — K5900 Constipation, unspecified: Secondary | ICD-10-CM | POA: Diagnosis present

## 2021-01-12 DIAGNOSIS — E861 Hypovolemia: Secondary | ICD-10-CM | POA: Diagnosis present

## 2021-01-12 DIAGNOSIS — Z7982 Long term (current) use of aspirin: Secondary | ICD-10-CM

## 2021-01-12 DIAGNOSIS — L8990 Pressure ulcer of unspecified site, unspecified stage: Secondary | ICD-10-CM | POA: Insufficient documentation

## 2021-01-12 DIAGNOSIS — E875 Hyperkalemia: Secondary | ICD-10-CM | POA: Diagnosis present

## 2021-01-12 DIAGNOSIS — Z6820 Body mass index (BMI) 20.0-20.9, adult: Secondary | ICD-10-CM

## 2021-01-12 DIAGNOSIS — Z9221 Personal history of antineoplastic chemotherapy: Secondary | ICD-10-CM

## 2021-01-12 DIAGNOSIS — Z419 Encounter for procedure for purposes other than remedying health state, unspecified: Secondary | ICD-10-CM

## 2021-01-12 LAB — CBC WITH DIFFERENTIAL/PLATELET
Abs Immature Granulocytes: 0.05 10*3/uL (ref 0.00–0.07)
Basophils Absolute: 0 10*3/uL (ref 0.0–0.1)
Basophils Relative: 1 %
Eosinophils Absolute: 0 10*3/uL (ref 0.0–0.5)
Eosinophils Relative: 0 %
HCT: 25.9 % — ABNORMAL LOW (ref 39.0–52.0)
Hemoglobin: 8.9 g/dL — ABNORMAL LOW (ref 13.0–17.0)
Immature Granulocytes: 1 %
Lymphocytes Relative: 8 %
Lymphs Abs: 0.3 10*3/uL — ABNORMAL LOW (ref 0.7–4.0)
MCH: 29.9 pg (ref 26.0–34.0)
MCHC: 34.4 g/dL (ref 30.0–36.0)
MCV: 86.9 fL (ref 80.0–100.0)
Monocytes Absolute: 0.2 10*3/uL (ref 0.1–1.0)
Monocytes Relative: 4 %
Neutro Abs: 3.6 10*3/uL (ref 1.7–7.7)
Neutrophils Relative %: 86 %
Platelets: 250 10*3/uL (ref 150–400)
RBC: 2.98 MIL/uL — ABNORMAL LOW (ref 4.22–5.81)
RDW: 18.6 % — ABNORMAL HIGH (ref 11.5–15.5)
WBC: 4.2 10*3/uL (ref 4.0–10.5)
nRBC: 0 % (ref 0.0–0.2)

## 2021-01-12 LAB — LACTIC ACID, PLASMA: Lactic Acid, Venous: 1 mmol/L (ref 0.5–1.9)

## 2021-01-12 LAB — SEDIMENTATION RATE: Sed Rate: 58 mm/hr — ABNORMAL HIGH (ref 0–20)

## 2021-01-12 LAB — RESP PANEL BY RT-PCR (FLU A&B, COVID) ARPGX2
Influenza A by PCR: NEGATIVE
Influenza B by PCR: NEGATIVE
SARS Coronavirus 2 by RT PCR: NEGATIVE

## 2021-01-12 LAB — COMPREHENSIVE METABOLIC PANEL
ALT: 20 U/L (ref 0–44)
AST: 19 U/L (ref 15–41)
Albumin: 2.6 g/dL — ABNORMAL LOW (ref 3.5–5.0)
Alkaline Phosphatase: 134 U/L — ABNORMAL HIGH (ref 38–126)
Anion gap: 7 (ref 5–15)
BUN: 17 mg/dL (ref 8–23)
CO2: 32 mmol/L (ref 22–32)
Calcium: 8.2 mg/dL — ABNORMAL LOW (ref 8.9–10.3)
Chloride: 90 mmol/L — ABNORMAL LOW (ref 98–111)
Creatinine, Ser: 0.63 mg/dL (ref 0.61–1.24)
GFR, Estimated: >60 mL/min (ref >60)
Glucose, Bld: 132 mg/dL — ABNORMAL HIGH (ref 70–99)
Potassium: 3.2 mmol/L — ABNORMAL LOW (ref 3.5–5.1)
Sodium: 129 mmol/L — ABNORMAL LOW (ref 135–145)
Total Bilirubin: 0.6 mg/dL (ref 0.3–1.2)
Total Protein: 5.9 g/dL — ABNORMAL LOW (ref 6.5–8.1)

## 2021-01-12 LAB — CBG MONITORING, ED: Glucose-Capillary: 101 mg/dL — ABNORMAL HIGH (ref 70–99)

## 2021-01-12 MED ORDER — POTASSIUM CHLORIDE IN NACL 40-0.9 MEQ/L-% IV SOLN
INTRAVENOUS | Status: AC
  Filled 2021-01-12: qty 1000

## 2021-01-12 MED ORDER — ASPIRIN EC 81 MG PO TBEC
81.0000 mg | DELAYED_RELEASE_TABLET | Freq: Every day | ORAL | Status: DC
  Administered 2021-01-12 – 2021-01-19 (×7): 81 mg via ORAL
  Filled 2021-01-12 (×7): qty 1

## 2021-01-12 MED ORDER — NALOXONE HCL 2 MG/2ML IJ SOSY
0.4000 mg | PREFILLED_SYRINGE | INTRAMUSCULAR | Status: DC | PRN
  Filled 2021-01-12: qty 2

## 2021-01-12 MED ORDER — OXYCODONE HCL ER 10 MG PO T12A
10.0000 mg | EXTENDED_RELEASE_TABLET | Freq: Two times a day (BID) | ORAL | Status: DC
  Administered 2021-01-12: 10 mg via ORAL
  Filled 2021-01-12: qty 1

## 2021-01-12 MED ORDER — INSULIN ASPART 100 UNIT/ML IJ SOLN
0.0000 [IU] | Freq: Three times a day (TID) | INTRAMUSCULAR | Status: DC
Start: 2021-01-13 — End: 2021-01-13

## 2021-01-12 MED ORDER — EZETIMIBE 10 MG PO TABS
10.0000 mg | ORAL_TABLET | Freq: Every day | ORAL | Status: DC
  Administered 2021-01-12 – 2021-01-19 (×7): 10 mg via ORAL
  Filled 2021-01-12 (×8): qty 1

## 2021-01-12 MED ORDER — ENOXAPARIN SODIUM 40 MG/0.4ML IJ SOSY
40.0000 mg | PREFILLED_SYRINGE | INTRAMUSCULAR | Status: DC
  Administered 2021-01-12 – 2021-01-15 (×3): 40 mg via SUBCUTANEOUS
  Filled 2021-01-12 (×3): qty 0.4

## 2021-01-12 MED ORDER — SENNA 8.6 MG PO TABS
2.0000 | ORAL_TABLET | Freq: Two times a day (BID) | ORAL | Status: DC
  Filled 2021-01-12: qty 2

## 2021-01-12 MED ORDER — HYDROMORPHONE HCL 1 MG/ML IJ SOLN
1.0000 mg | INTRAMUSCULAR | Status: DC | PRN
  Administered 2021-01-12 – 2021-01-13 (×3): 1 mg via INTRAVENOUS
  Filled 2021-01-12 (×3): qty 1

## 2021-01-12 MED ORDER — HYDROMORPHONE HCL 1 MG/ML IJ SOLN
1.0000 mg | Freq: Once | INTRAMUSCULAR | Status: AC
  Administered 2021-01-12: 1 mg via INTRAVENOUS
  Filled 2021-01-12: qty 1

## 2021-01-12 MED ORDER — INSULIN ASPART 100 UNIT/ML IJ SOLN: 0.0000 [IU] | Freq: Every day | INTRAMUSCULAR | Status: DC

## 2021-01-12 MED ORDER — SODIUM CHLORIDE 0.9 % IV BOLUS
1000.0000 mL | Freq: Once | INTRAVENOUS | Status: AC
  Administered 2021-01-12: 1000 mL via INTRAVENOUS

## 2021-01-12 MED ORDER — ENSURE ENLIVE PO LIQD
237.0000 mL | Freq: Two times a day (BID) | ORAL | Status: DC
  Administered 2021-01-13 (×2): 237 mL via ORAL

## 2021-01-12 MED ORDER — LIDOCAINE 5 % EX PTCH
1.0000 | MEDICATED_PATCH | Freq: Two times a day (BID) | CUTANEOUS | Status: DC
  Administered 2021-01-12 – 2021-01-19 (×13): 1 via TRANSDERMAL
  Filled 2021-01-12 (×16): qty 1

## 2021-01-12 MED ORDER — POTASSIUM CHLORIDE CRYS ER 20 MEQ PO TBCR
40.0000 meq | EXTENDED_RELEASE_TABLET | Freq: Two times a day (BID) | ORAL | Status: DC
  Administered 2021-01-12 – 2021-01-14 (×4): 40 meq via ORAL
  Filled 2021-01-12 (×4): qty 2

## 2021-01-12 MED ORDER — MELATONIN 3 MG PO TABS
3.0000 mg | ORAL_TABLET | Freq: Every evening | ORAL | Status: DC | PRN
  Filled 2021-01-12: qty 1

## 2021-01-12 MED ORDER — ONDANSETRON HCL 4 MG/2ML IJ SOLN
4.0000 mg | Freq: Four times a day (QID) | INTRAMUSCULAR | Status: DC | PRN
  Administered 2021-01-13: 4 mg via INTRAVENOUS
  Filled 2021-01-12: qty 2

## 2021-01-12 MED ORDER — ACETAMINOPHEN 325 MG PO TABS: 650.0000 mg | ORAL_TABLET | Freq: Four times a day (QID) | ORAL | Status: DC | PRN

## 2021-01-12 MED ORDER — ATORVASTATIN CALCIUM 20 MG PO TABS
80.0000 mg | ORAL_TABLET | Freq: Every day | ORAL | Status: DC
  Administered 2021-01-14 – 2021-01-18 (×5): 80 mg via ORAL
  Filled 2021-01-12 (×5): qty 1

## 2021-01-12 MED ORDER — VITAMIN B-12 1000 MCG PO TABS
1000.0000 ug | ORAL_TABLET | Freq: Every day | ORAL | Status: DC
  Administered 2021-01-12 – 2021-01-19 (×7): 1000 ug via ORAL
  Filled 2021-01-12 (×8): qty 1

## 2021-01-12 MED ORDER — KETOROLAC TROMETHAMINE 30 MG/ML IJ SOLN
15.0000 mg | Freq: Once | INTRAMUSCULAR | Status: AC
  Administered 2021-01-12: 15 mg via INTRAVENOUS
  Filled 2021-01-12: qty 1

## 2021-01-12 MED ORDER — CYCLOBENZAPRINE HCL 10 MG PO TABS
5.0000 mg | ORAL_TABLET | Freq: Three times a day (TID) | ORAL | Status: DC | PRN
  Administered 2021-01-12 – 2021-01-18 (×4): 5 mg via ORAL
  Filled 2021-01-12 (×4): qty 1

## 2021-01-12 MED ORDER — POLYETHYLENE GLYCOL 3350 17 G PO PACK: 17.0000 g | PACK | Freq: Every day | ORAL | Status: DC | PRN

## 2021-01-12 NOTE — Telephone Encounter (Signed)
Nutrition  Called for nutrition follow-up.  Left message on wife's mobile number with call back number.  Donald Lowery B. Zenia Resides, [place], [place] Registered Dietitian [phone] (mobile)

## 2021-01-12 NOTE — ED Notes (Signed)
Pt able to sit up with assistance from chair and was able to lay down on bed. Pt reports less pain than when he first arrived.

## 2021-01-12 NOTE — H&P (Addendum)
History and Physical  Donald Lowery UEA:[id number] DOB: 25-Jul-1950 DOA: 01/12/2021  Referring physician: Dr. Cheri Fowler, [place]  PCP: Dayton Martes Victoriano Lain, NP  Outpatient Specialists: Medical oncology, cardiology Patient coming from: Home.  Chief Complaint: Severe uncontrolled lower back pain.  HPI: Donald Lowery is a 70 y.o. 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  No falls, no trauma.  He was seen yesterday by his oncologist and had been on prednisone taper pack which helped relieve his pain.  He denies any chest pain shortness of breath cough or hemoptysis.  He denies any abdominal pain.  No nausea or vomiting, constipation or diarrhea.  No urinary symptoms.  EDP requested admission for symptoms management and pain control.  ED Course: Temperature 97.7.  BP 132/54, pulse rate 87, respiration rate 17, O2 saturation 95% on room air.  Lab studies remarkable for serum sodium 129, potassium 3.2, glucose 132, alkaline phosphatase 134, hemoglobin 8.9, MCV 86.  Sed rate 58.  Review of Systems: Review of systems as noted in the HPI. All other systems reviewed and are negative.   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 04/2018 NSTEMI/Cath: LM nl, LAD 50p, 5m/d diffuse-small caliber, LCX heavily Ca2+ sev prox/mid dzs, RCA 90 diff distal dzs into RPL and RPDA. EF 50-55%-->Med Rx.   Hepatic steatosis    History of 2019 novel coronavirus disease (COVID-19) 10/12/2020   History of echocardiogram    a. 04/2018 Echo: EF 55-60%,  no rwma, mild MR, mildly dil LA. Nl RV fxn.   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 Quit 04/2018.   Past Surgical History:  Procedure Laterality Date   BRAIN SURGERY     CARDIAC CATHETERIZATION     IR IMAGING GUIDED PORT INSERTION  10/28/2020   LEFT HEART CATH AND CORONARY ANGIOGRAPHY N/A 05/16/2018   Procedure: LEFT HEART CATH AND CORONARY ANGIOGRAPHY poss PCI;  Surgeon: [name], MD;  Location: [place] [facility];  Service: Cardiovascular;  Laterality: N/A;   VIDEO BRONCHOSCOPY WITH ENDOBRONCHIAL NAVIGATION N/A 10/24/2020   Procedure: ROBOTIC ASSISTED VIDEO BRONCHOSCOPY WITH ENDOBRONCHIAL NAVIGATION;  Surgeon: Tyler Pita, MD;  Location: [facility] ORS;  Service: Pulmonary;  Laterality: N/A;    Social History:  reports that he quit smoking about 2 years ago. His smoking use included cigarettes. He has a 45.00 pack-year smoking history. He has never used smokeless tobacco. He reports that he does not drink alcohol and does not use drugs.   No Known Allergies  Family History  Problem Relation Age of Onset   Heart Problems Mother    Heart attack Father    Diabetes Brother    Heart Problems Brother       Prior to Admission medications   Medication  Sig Start Date End Date Taking? Authorizing Provider  aspirin EC 81 MG EC tablet Take 1 tablet (81 mg total) by mouth daily. 05/17/18   Hillary Bow, MD  atorvastatin (LIPITOR) 80 MG tablet Take 1 tablet (80 mg total) by mouth daily at 6 PM. 12/26/20   Gollan, Kathlene November, MD  cyclobenzaprine (FLEXERIL) 5 MG tablet Take 1 tablet (5 mg total) by mouth 3 (three) times daily as needed for muscle spasms. 01/11/21   Lloyd Huger, MD  dicyclomine (BENTYL) 10 MG  capsule Take 1 capsule (10 mg total) by mouth 3 (three) times daily as needed for up to 14 days for spasms. 09/17/20 01/11/21  Harvest Dark, MD  ezetimibe (ZETIA) 10 MG tablet Take 1 tablet (10 mg total) by mouth daily. 12/26/20 03/26/21  [name], MD  icosapent Ethyl (VASCEPA) 1 g capsule Take 2 capsules (2 g total) by mouth 2 (two) times daily. 10/21/20 01/11/21  [name], MD  isosorbide mononitrate (IMDUR) 30 MG 24 hr tablet Take 1 tablet (30 mg total) by mouth daily. 12/26/20   [name], MD  lidocaine (LIDODERM) 5 % Place 1 patch onto the skin every 12 (twelve) hours. Remove & Discard patch within 12 hours or as directed by MD 01/08/21 01/08/22  Caryn Section, Linden Dolin, PA-C  losartan (COZAAR) 25 MG tablet Take 1 tablet (25 mg total) by mouth daily. Patient not taking: Reported on 01/11/2021 12/26/20   [name], MD  Menthol-Methyl Salicylate (ICY HOT) 35-46 % STCK Apply 1 application topically daily as needed (shoulder pain).    [provider]  metFORMIN (GLUCOPHAGE) 500 MG tablet Take 500 mg by mouth every morning. 08/27/20   [provider]  morphine (MS CONTIN) 15 MG 12 hr tablet Take 2 tablets (30 mg total) by mouth every 12 (twelve) hours. 01/11/21   Lloyd Huger, MD  naloxone [facility]) nasal spray 4 mg/0.1 mL SPRAY 1 SPRAY INTO ONE NOSTRIL AS DIRECTED FOR OPIOID OVERDOSE (TURN PERSON ON SIDE AFTER DOSE. IF NO RESPONSE IN 2-3 MINUTES OR PERSON RESPONDS BUT RELAPSES, REPEAT USING A NEW SPRAY DEVICE AND SPRAY INTO THE OTHER NOSTRIL. CALL 911 AFTER USE.) * EMERGENCY USE ONLY * Patient not taking: No sig reported 11/22/20   Borders, [name], NP  nitroGLYCERIN (NITROSTAT) 0.4 MG SL tablet Place 1 tablet (0.4 mg total) under the tongue every 5 (five) minutes as needed for chest pain. Patient not taking: No sig reported 12/26/20 03/26/21  [name], MD  Oxycodone HCl 10 MG TABS Take 1 tablet (10 mg total) by mouth every 4 (four) hours as needed.  01/11/21   Lloyd Huger, MD  predniSONE (STERAPRED UNI-PAK 21 TAB) 10 MG (21) TBPK tablet Take 6 tablets (60mg ) x 1 day, then take 5 tablets (50mg ) x 1 day, then take 4 tablets (40mg ) x 1 day, then take 3 tablets (30mg ) x 1 day, then take 2 tablets (20mg ) x 1 day, then take 1 tablet (10mg ) x 1 day, then stop Patient not taking: Reported on 01/11/2021 01/03/21   Borders, [name], NP  sucralfate (CARAFATE) 1 g tablet Take 0.5 tablets (0.5 g total) by mouth 3 (three) times daily before meals. 11/15/20   Noreene Filbert, MD  vitamin B-12 (CYANOCOBALAMIN) 1000 MCG tablet Take 1,000 mcg by mouth daily.    [provider]  prochlorperazine (COMPAZINE) 10 MG tablet Take 1 tablet (10 mg total) by mouth every 6 (six) hours as needed (Nausea or  vomiting). Patient not taking: No sig reported 10/28/20 01/12/21  Lloyd Huger, MD    Physical Exam: BP 127/70   Pulse 71   Temp 97.7 F (36.5 C) (Oral)   Resp 17   Ht 5\' 4"  (1.626 m)   Wt 53.3 kg   SpO2 99%   BMI 20.15 kg/m   General: 70 y.o. year-old male frail-appearing in no acute distress.  Alert and oriented x3. Cardiovascular: Regular rate and rhythm with no rubs or gallops.  No thyromegaly or JVD noted.  No lower extremity edema. 2/4 pulses in all 4 extremities. Respiratory: Clear to auscultation with no wheezes or rales. Good inspiratory effort. Abdomen: Soft nontender nondistended with normal bowel sounds x4 quadrants. Muskuloskeletal: No cyanosis, clubbing or edema noted bilaterally Neuro: CN II-XII intact, strength, sensation, reflexes Skin: No ulcerative lesions noted or rashes Psychiatry: Judgement and insight appear normal. Mood is appropriate for condition and setting          Labs on Admission:  Basic Metabolic Panel: Recent Labs  Lab 01/11/21 0835 01/12/21 1816  NA 128* 129*  K 3.1* 3.2*  CL 90* 90*  CO2 30 32  GLUCOSE 188* 132*  BUN 20 17  CREATININE 0.81 0.63  CALCIUM 8.8* 8.2*   Liver Function  Tests: Recent Labs  Lab 01/11/21 0835 01/12/21 1816  AST 16 19  ALT 19 20  ALKPHOS 158* 134*  BILITOT 1.0 0.6  PROT 6.4* 5.9*  ALBUMIN 3.0* 2.6*   No results for input(s): LIPASE, AMYLASE in the last 168 hours. No results for input(s): AMMONIA in the last 168 hours. CBC: Recent Labs  Lab 01/11/21 0835 01/12/21 1816  WBC 5.2 4.2  NEUTROABS 4.3 3.6  HGB 8.7* 8.9*  HCT 26.0* 25.9*  MCV 87.0 86.9  PLT 280 250   Cardiac Enzymes: No results for input(s): CKTOTAL, CKMB, CKMBINDEX, TROPONINI in the last 168 hours.  BNP (last 3 results) No results for input(s): BNP in the last 8760 hours.  ProBNP (last 3 results) No results for input(s): PROBNP in the last 8760 hours.  CBG: No results for input(s): GLUCAP in the last 168 hours.  Radiological Exams on Admission: No results found.  EKG: I independently viewed the EKG done and my findings are as followed: None available at the time of this visit.  Assessment/Plan Present on Admission:  Intractable back pain  Active Problems:   Intractable back pain  Intractable lower back pain in the setting of malignancy History of non-small cell lung cancer with bone mets to left iliac crest. Pain control with IV Dilaudid 1 mg every 2 hours as needed for severe pain. OxyContin 10 mg twice daily for moderate pain x2 doses, in order to reassess his response. Bowel regimen IV Narcan as needed  Hypovolemic hyponatremia Serum sodium 129 Started normal saline KCl 40 mEq at 50 cc/h x 1 day. Repeat BMP in the morning Encourage oral intake  Hypokalemia Serum potassium 3.2 Repleted orally and intravenously. Repeat BMP in the morning and obtain magnesium level.  Hypertension BPs are currently soft Hold off home oral antihypertensives for now Resume when BPs are more stable. Monitor Lowery signs.  Hyperlipidemia Resume home Lipitor 80 mg daily and Zetia 10 mg daily.  Impaired glucose tolerance Last A1c 5.8 On metformin, hold  off. Liberalize diet Insulin sliding scale.  Moderate protein calorie malnutrition Serum albumin 2.6 Moderate muscle mass loss Liberalize diet Ensure, diet supplement   DVT prophylaxis: Subcu Lovenox daily  Code Status: Full code  Family Communication: Wife at bedside  Disposition Plan: Admitted to [place] unit with remote telemetry.  Consults called: None  Admission status: Observation status.   Status is: Observation    Dispo:  Patient From:  Home  Planned Disposition:  Home  Medically stable for discharge:  No       Kayleen Memos MD [facility] Pager [phone]  If 7PM-7AM, please contact night-coverage [email or web address] Password [facility]  01/12/2021, 8:02 PM

## 2021-01-12 NOTE — Progress Notes (Signed)
DISCONTINUE ON PATHWAY REGIMEN - Non-Small Cell Lung     Administer weekly:     Paclitaxel      Carboplatin   **Always confirm dose/schedule in your pharmacy ordering system**  REASON: Continuation Of Treatment PRIOR TREATMENT: [id number]: Carboplatin AUC=2 + Paclitaxel 45 mg/m2 Weekly During Radiation TREATMENT RESPONSE: Partial Response (PR)  START OFF PATHWAY REGIMEN - Non-Small Cell Lung   [id number]:Pembrolizumab 200 mg IV D1 q21 Days:   A cycle is every 21 days:     Pembrolizumab   **Always confirm dose/schedule in your pharmacy ordering system**  Patient Characteristics: Preoperative or Nonsurgical Candidate (Clinical Staging), Stage III - Nonsurgical Candidate (Nonsquamous and Squamous), PS = 0, 1 Therapeutic Status: Preoperative or Nonsurgical Candidate (Clinical Staging) AJCC T Category: cT4 AJCC N Category: cN0 AJCC M Category: cM0 AJCC 8 Stage Grouping: IIIA ECOG Performance Status: 0 Intent of Therapy: Non-Curative / Palliative Intent, Discussed with Patient

## 2021-01-12 NOTE — ED Triage Notes (Signed)
Pt comes into the ED via [facility] from home c/o pain management for his back pain.  Pt had a back injury 3 weeks ago but has been unable to get in with the specialist.  Pt seen here Sunday and got xrays and pain medicine.    122/49 98% RA 81 HR

## 2021-01-12 NOTE — ED Triage Notes (Addendum)
See first nurse note- pt to ER with complaints of back pain from an injury that occurred three weeks ago. Reports the pain hasn't improved or worsened. States he is unable to ambulate at home and has had to cancel other doctor's appointments due to the pain.   States that he wants to be admitted for pain control. Verbalizes frustration that he came in an ambulance and wasn't taken straight back to a room.

## 2021-01-12 NOTE — ED Provider Notes (Signed)
[facility] Emergency Department Provider Note   ____________________________________________   Event Date/Time   First MD Initiated Contact with Patient 01/12/21 1807     (approximate)  I have reviewed the triage vital signs and the nursing notes.   HISTORY  Chief Complaint Back Pain    HPI Donald Lowery is a 70 y.o. 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  Patient states that he has tried oxycodone, morphine, cyclobenzaprine, ibuprofen, and lidocaine patches with no relief ASSOCIATED SYMPTOMS: Diaphoresis, bladder incontinence   Per medical record review, patient has history of of metastatic small cell lung cancer to the left iliac crest.          Past Medical History:  Diagnosis Date   Aortic atherosclerosis ([place])    [place] hernia 09/21/2020   fatty   Coronary artery disease    a. 04/2018 NSTEMI/Cath: LM nl, LAD 50p, 74m/d diffuse-small caliber, LCX heavily Ca2+ sev prox/mid dzs, RCA 90 diff distal dzs into RPL and RPDA. EF 50-55%-->Med Rx.   Hepatic steatosis    History of 2019 novel coronavirus disease (COVID-19) 10/12/2020   History of echocardiogram    a. 04/2018 Echo: EF 55-60%, no rwma, mild MR, mildly dil LA. Nl RV fxn.   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 Quit 04/2018.    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   Cancer related pain 09/21/2020   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/21/2020   Type 2 diabetes mellitus without complication, without long-term current use of insulin ([place]) 05/31/2020   Aortic atherosclerosis ([place]) 02/10/2019   CAD (coronary artery disease) 05/16/2018   NSTEMI (non-ST elevated myocardial infarction) ([place]) 05/15/2018   Emphysema of lung ([place]) 05/15/2018    Past Surgical History:  Procedure Laterality Date   BRAIN SURGERY     CARDIAC CATHETERIZATION     IR IMAGING GUIDED PORT INSERTION  10/28/2020   LEFT HEART CATH AND CORONARY ANGIOGRAPHY N/A 05/16/2018   Procedure: LEFT HEART CATH AND CORONARY ANGIOGRAPHY poss PCI;  Surgeon: [name], MD;  Location: [facility];  Service: Cardiovascular;  Laterality: N/A;   VIDEO BRONCHOSCOPY WITH ENDOBRONCHIAL NAVIGATION N/A 10/24/2020   Procedure: ROBOTIC ASSISTED VIDEO BRONCHOSCOPY WITH ENDOBRONCHIAL NAVIGATION;  Surgeon: Tyler Pita, MD;  Location: [facility]  ORS;  Service: Pulmonary;  Laterality: N/A;    Prior to Admission medications   Medication Sig Start Date End Date Taking? Authorizing Provider  aspirin EC 81 MG EC tablet Take 1 tablet (81 mg total) by mouth daily. 05/17/18  Yes Hillary Bow, MD  atorvastatin (LIPITOR) 80 MG tablet Take 1 tablet (80 mg total) by mouth daily at 6 PM. 12/26/20  Yes  Gollan, Kathlene November, MD  cyclobenzaprine (FLEXERIL) 5 MG tablet Take 1 tablet (5 mg total) by mouth 3 (three) times daily as needed for muscle spasms. 01/11/21  Yes Lloyd Huger, MD  ezetimibe (ZETIA) 10 MG tablet Take 1 tablet (10 mg total) by mouth daily. 12/26/20 03/26/21 Yes Gollan, Kathlene November, MD  icosapent Ethyl (VASCEPA) 1 g capsule Take 2 capsules (2 g total) by mouth 2 (two) times daily. 10/21/20 01/12/21 Yes Gollan, Kathlene November, MD  isosorbide mononitrate (IMDUR) 30 MG 24 hr tablet Take 1 tablet (30 mg total) by mouth daily. 12/26/20  Yes [name], MD  metFORMIN (GLUCOPHAGE) 500 MG tablet Take 500 mg by mouth every morning. 08/27/20  Yes [provider]  morphine (MS CONTIN) 15 MG 12 hr tablet Take 2 tablets (30 mg total) by mouth every 12 (twelve) hours. 01/11/21  Yes Lloyd Huger, MD  Oxycodone HCl 10 MG TABS Take 1 tablet (10 mg total) by mouth every 4 (four) hours as needed. 01/11/21  Yes Lloyd Huger, MD  sucralfate (CARAFATE) 1 g tablet Take 0.5 tablets (0.5 g total) by mouth 3 (three) times daily before meals. 11/15/20  Yes Chrystal, Eulas Post, MD  vitamin B-12 (CYANOCOBALAMIN) 1000 MCG tablet Take 1,000 mcg by mouth daily.   Yes [provider]  dicyclomine (BENTYL) 10 MG capsule Take 1 capsule (10 mg total) by mouth 3 (three) times daily as needed for up to 14 days for spasms. 09/17/20 01/11/21  Harvest Dark, MD  lidocaine (LIDODERM) 5 % Place 1 patch onto the skin every 12 (twelve) hours. Remove & Discard patch within 12 hours or as directed by MD 01/08/21 01/08/22  Caryn Section, Linden Dolin, PA-C  losartan (COZAAR) 25 MG tablet Take 1 tablet (25 mg total) by mouth daily. Patient not taking: No sig reported 12/26/20   [name], MD  Menthol-Methyl Salicylate (ICY HOT) 85-27 % STCK Apply 1 application topically daily as needed (shoulder pain).    [provider]  naloxone (NARCAN) nasal spray 4 mg/0.1 mL SPRAY 1 SPRAY INTO ONE NOSTRIL AS  DIRECTED FOR OPIOID OVERDOSE (TURN PERSON ON SIDE AFTER DOSE. IF NO RESPONSE IN 2-3 MINUTES OR PERSON RESPONDS BUT RELAPSES, REPEAT USING A NEW SPRAY DEVICE AND SPRAY INTO THE OTHER NOSTRIL. CALL 911 AFTER USE.) * EMERGENCY USE ONLY * Patient not taking: No sig reported 11/22/20   Borders, [name], NP  nitroGLYCERIN (NITROSTAT) 0.4 MG SL tablet Place 1 tablet (0.4 mg total) under the tongue every 5 (five) minutes as needed for chest pain. Patient not taking: No sig reported 12/26/20 03/26/21  [name], MD  predniSONE (STERAPRED UNI-PAK 21 TAB) 10 MG (21) TBPK tablet Take 6 tablets (60mg ) x 1 day, then take 5 tablets (50mg ) x 1 day, then take 4 tablets (40mg ) x 1 day, then take 3 tablets (30mg ) x 1 day, then take 2 tablets (20mg ) x 1 day, then take 1 tablet (10mg ) x 1 day, then stop Patient not taking: No sig reported 01/03/21   Borders, [name], NP  prochlorperazine (COMPAZINE) 10 MG  tablet Take 1 tablet (10 mg total) by mouth every 6 (six) hours as needed (Nausea or vomiting). Patient not taking: No sig reported 10/28/20 01/12/21  Lloyd Huger, MD    Allergies Patient has no known allergies.  Family History  Problem Relation Age of Onset   Heart Problems Mother    Heart attack Father    Diabetes Brother    Heart Problems Brother     Social History Social History   Tobacco Use   Smoking status: Former    Packs/day: 1.00    Years: 45.00    Pack years: 45.00    Types: Cigarettes    Quit date: 05/15/2018    Years since quitting: 2.6   Smokeless tobacco: Never  Vaping Use   Vaping Use: Never used  Substance Use Topics   Alcohol use: Never   Drug use: Never    Review of Systems Constitutional: No fever/chills Eyes: No visual changes. ENT: No sore throat. Cardiovascular: Denies chest pain. Respiratory: Denies shortness of breath. Gastrointestinal: No abdominal pain.  No nausea, no vomiting.  No diarrhea. Genitourinary: Negative for dysuria. Musculoskeletal:  Positive for chronic severe low back pain.  Negative for acute arthralgias Skin: Negative for rash. Neurological: Negative for headaches, weakness/numbness/paresthesias in any extremity Psychiatric: Negative for suicidal ideation/homicidal ideation   ____________________________________________   PHYSICAL EXAM:  VITAL SIGNS: ED Triage Vitals  Enc Vitals Group     BP 01/12/21 1518 98/86     Pulse Rate 01/12/21 1518 80     Resp 01/12/21 1518 16     Temp 01/12/21 1518 97.7 F (36.5 C)     Temp Source 01/12/21 1518 Oral     SpO2 01/12/21 1518 100 %     Weight 01/12/21 1519 117 lb 6.4 oz (53.3 kg)     Height 01/12/21 1519 5\' 4"  (1.626 m)     Head Circumference --      Peak Flow --      Pain Score 01/12/21 1519 10     Pain Loc --      Pain Edu? --      Excl. in [place]? --    Constitutional: Alert and oriented.  Elderly Caucasian male in a recliner chair and tearful in moderate distress secondary to pain. Eyes: Conjunctivae are normal. PERRL. Head: Atraumatic. Nose: No congestion/rhinnorhea. Mouth/Throat: Mucous membranes are moist. Neck: No stridor Cardiovascular: Grossly normal heart sounds.  Good peripheral circulation. Respiratory: Normal respiratory effort.  No retractions. Gastrointestinal: Soft and nontender. No distention. Musculoskeletal: Significant tenderness to palpation over entirety of lumbar back region.  No obvious deformities Neurologic:  Normal speech and language. No gross focal neurologic deficits are appreciated. Skin:  Skin is warm and dry. No rash noted. Psychiatric: Mood and affect are normal. Speech and behavior are normal.  ____________________________________________   LABS (all labs ordered are listed, but only abnormal results are displayed)  Labs Reviewed  COMPREHENSIVE METABOLIC PANEL - Abnormal; Notable for the following components:      Result Value   Sodium 129 (*)    Potassium 3.2 (*)    Chloride 90 (*)    Glucose, Bld 132 (*)    Calcium  8.2 (*)    Total Protein 5.9 (*)    Albumin 2.6 (*)    Alkaline Phosphatase 134 (*)    All other components within normal limits  CBC WITH DIFFERENTIAL/PLATELET - Abnormal; Notable for the following components:   RBC 2.98 (*)    Hemoglobin 8.9 (*)  HCT 25.9 (*)    RDW 18.6 (*)    Lymphs Abs 0.3 (*)    All other components within normal limits  SEDIMENTATION RATE - Abnormal; Notable for the following components:   Sed Rate 58 (*)    All other components within normal limits  HEMOGLOBIN A1C - Abnormal; Notable for the following components:   Hgb A1c MFr Bld 6.1 (*)    All other components within normal limits  BASIC METABOLIC PANEL - Abnormal; Notable for the following components:   Sodium 133 (*)    Chloride 96 (*)    Creatinine, Ser 0.53 (*)    Calcium 7.9 (*)    All other components within normal limits  CBG MONITORING, ED - Abnormal; Notable for the following components:   Glucose-Capillary 101 (*)    All other components within normal limits  RESP PANEL BY RT-PCR (FLU A&B, COVID) ARPGX2  LACTIC ACID, PLASMA  MAGNESIUM  BASIC METABOLIC PANEL  MAGNESIUM    RADIOLOGY  ED MD interpretation: Lumbar x-ray shows new pathologic fracture at L3  Official radiology report(s): MR Lumbar Spine W Wo Contrast  Result Date: 01/13/2021 CLINICAL DATA:  Spine fracture, lumbosacral, pathological new pathologic compression fracture lumbar spine EXAM: MRI LUMBAR SPINE WITHOUT AND WITH CONTRAST TECHNIQUE: Multiplanar and multiecho pulse sequences of the lumbar spine were obtained without and with intravenous contrast. CONTRAST:  80mL GADAVIST GADOBUTROL 1 MMOL/ML IV SOLN COMPARISON:  PET-CT 12/12/2020.  Lumbar radiographs 01/12/2021. FINDINGS: Segmentation: Standard segmentation is assumed. The inferior-most fully formed intervertebral disc is labeled L5-S1. Alignment:  Substantial sagittal subluxation.  Levocurvature. Vertebrae: Abnormal enhancement, T1 hypointensity and STIR hyperintensity  involving the entire L3 vertebral body and bilateral L3 pedicles, compatible with metastasis. Pathologic fracture with 50% vertebral body height loss. Extraosseous extension of tumor with right anterior paraspinal soft tissue mass measuring 4.2 cm with surrounding edema/enhancement. There also is posterior epidural extension of enhancing tumor into the canal and right foramen with associated severe canal stenosis and moderate to severe right foraminal stenosis. Conus medullaris and cauda equina: Conus extends to the L1 level. Conus appears normal Paraspinal and other soft tissues: Right anterolateral paraspinal soft tissue mass at L3 with extensive surrounding edema and enhancement, as detailed above. Disc levels: T12-L1: No significant disc protrusion, foraminal stenosis, or canal stenosis. L1-L2: Mild disc bulging without significant canal or foraminal stenosis. L2-L3: Mild disc bulging without significant canal or foraminal stenosis. L3-L4: Pathologic fracture at L3 with posterior epidural extension of tumor into the canal and right foramen, as described above. Resulting severe canal stenosis and moderate to severe right foraminal stenosis. L4-L5: Small broad disc bulge and bilateral facet hypertrophy with mild right foraminal stenosis. No significant canal or left foraminal stenosis. L5-S1: Facet hypertrophy without significant canal or foraminal stenosis. Other: Please see MRI of the pelvis for evaluation of the pelvis/sacrum. IMPRESSION: 1. Osseous metastatic disease at L3 with pathologic fracture (50% height loss) and posterior epidural extension of tumor into the canal and right foramen. Resulting severe canal stenosis and moderate to severe right foraminal stenosis. Associated right anterolateral 4.2 cm paraspinal soft tissue mass at L3 with surrounding inflammation/edema. 2. No evidence of osseous metastatic disease in the other lumbar vertebral bodies. Please see concurrent MRI of the pelvis for  evaluation of the pelvis/sacrum. Electronically Signed   By: Margaretha Sheffield M.D.   On: 01/13/2021 12:13   MR PELVIS W WO CONTRAST  Result Date: 01/13/2021 CLINICAL DATA:  70 y.o. 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 CONTRAST:  79mL GADAVIST GADOBUTROL 1 MMOL/ML IV SOLN COMPARISON:  PET-CT 12/12/2020 FINDINGS: Bones: No hip fracture, dislocation or avascular necrosis. 3.3 x 3.8 cm soft tissue mass with bone destruction involving the left iliac crest with surrounding bone marrow edema. Mild edema in the adjacent iliacus muscle which may related to radiation changes. 12 mm focus of osseous metastatic disease in the right iliac crest without a soft tissue component. 5 mm osseous metastatic disease in the S1 vertebral body. Normal sacrum and sacroiliac joints. No SI joint widening or erosive changes. Articular cartilage and labrum Articular cartilage:  No chondral defect. Labrum: Grossly intact, but evaluation is limited by lack of intraarticular fluid. Joint or bursal effusion Joint effusion:  No hip joint effusion.  No SI joint effusion. Bursae:  No bursa formation. Muscles and tendons Flexors: Mild perifascial edema involving the iliacus muscles bilaterally. Extensors: Normal. Abductors: Normal. Adductors: Normal. Gluteals: Normal. Hamstrings: Normal. Other findings No pelvic free fluid. No fluid collection or hematoma. No inguinal lymphadenopathy. No inguinal hernia. IMPRESSION: 1. Osseous metastatic disease involving the left iliac crest with a large soft tissue component measuring 3.3 x 3.8 cm and surrounding bone marrow edema. Mild edema in the adjacent iliacus  muscle which may related to radiation changes. 12 mm focus of osseous metastatic disease in the right iliac crest. 5 mm osseous metastatic disease in the S1 vertebral body. 2. No hip fracture, dislocation or avascular necrosis. Electronically Signed   By: Kathreen Devoid M.D.   On: 01/13/2021 13:18    ____________________________________________   PROCEDURES  Procedure(s) performed (including Critical Care):  Procedures   ____________________________________________   INITIAL IMPRESSION / ASSESSMENT AND PLAN / ED COURSE  As part of my medical decision making, I reviewed the following data within the electronic medical record, if available:  Nursing notes reviewed and incorporated, Labs reviewed, EKG interpreted, Old chart reviewed, Radiograph reviewed and Notes from prior ED visits reviewed and incorporated        Patient is a 70 year old male with the above-stated past medical history presents for severe back pain. X-ray of the lumbar spine shows new pathologic fracture at L3 which is likely source of patient's pain. X-ray shows no evidence of retropulsion and patient has no new neurologic symptoms concerning for spinal stenosis or impingement. Patient's pain control is been extremely difficult as he is on multiple pain medications at home including morphine, oxycodone, gabapentin, cyclobenzaprine, and lidocaine patches without improvement of his pain.  Patient has required 2 mg of hydromorphone in order to partially alleviate his pain and therefore will require admission for intractable pain.  Dispo: Admit to medicine      ____________________________________________   FINAL CLINICAL IMPRESSION(S) / ED DIAGNOSES  Final diagnoses:  Bilateral low back pain with bilateral sciatica, unspecified chronicity  Intractable low back pain     ED Discharge Orders     None        Note:  This document was prepared using Dragon voice recognition software and may include  unintentional dictation errors.    Naaman Plummer, MD 01/13/21 [phone]

## 2021-01-13 ENCOUNTER — Inpatient Hospital Stay: Payer: Medicare HMO

## 2021-01-13 ENCOUNTER — Encounter: Payer: Self-pay | Admitting: Obstetrics and Gynecology

## 2021-01-13 ENCOUNTER — Ambulatory Visit: Payer: Medicare HMO

## 2021-01-13 DIAGNOSIS — E871 Hypo-osmolality and hyponatremia: Secondary | ICD-10-CM | POA: Diagnosis not present

## 2021-01-13 DIAGNOSIS — Z515 Encounter for palliative care: Secondary | ICD-10-CM | POA: Diagnosis not present

## 2021-01-13 DIAGNOSIS — E222 Syndrome of inappropriate secretion of antidiuretic hormone: Secondary | ICD-10-CM | POA: Diagnosis present

## 2021-01-13 DIAGNOSIS — Z20822 Contact with and (suspected) exposure to covid-19: Secondary | ICD-10-CM | POA: Diagnosis present

## 2021-01-13 DIAGNOSIS — Z419 Encounter for procedure for purposes other than remedying health state, unspecified: Secondary | ICD-10-CM | POA: Diagnosis not present

## 2021-01-13 DIAGNOSIS — C3492 Malignant neoplasm of unspecified part of left bronchus or lung: Secondary | ICD-10-CM | POA: Diagnosis present

## 2021-01-13 DIAGNOSIS — Z8616 Personal history of COVID-19: Secondary | ICD-10-CM | POA: Diagnosis not present

## 2021-01-13 DIAGNOSIS — R52 Pain, unspecified: Secondary | ICD-10-CM | POA: Diagnosis present

## 2021-01-13 DIAGNOSIS — E861 Hypovolemia: Secondary | ICD-10-CM | POA: Diagnosis present

## 2021-01-13 DIAGNOSIS — L89152 Pressure ulcer of sacral region, stage 2: Secondary | ICD-10-CM | POA: Diagnosis present

## 2021-01-13 DIAGNOSIS — C7951 Secondary malignant neoplasm of bone: Secondary | ICD-10-CM | POA: Diagnosis present

## 2021-01-13 DIAGNOSIS — M5441 Lumbago with sciatica, right side: Secondary | ICD-10-CM | POA: Diagnosis present

## 2021-01-13 DIAGNOSIS — I2510 Atherosclerotic heart disease of native coronary artery without angina pectoris: Secondary | ICD-10-CM | POA: Diagnosis present

## 2021-01-13 DIAGNOSIS — J3801 Paralysis of vocal cords and larynx, unilateral: Secondary | ICD-10-CM | POA: Diagnosis present

## 2021-01-13 DIAGNOSIS — I700 Atherosclerosis of aorta: Secondary | ICD-10-CM | POA: Diagnosis present

## 2021-01-13 DIAGNOSIS — J438 Other emphysema: Secondary | ICD-10-CM | POA: Diagnosis present

## 2021-01-13 DIAGNOSIS — I252 Old myocardial infarction: Secondary | ICD-10-CM | POA: Diagnosis not present

## 2021-01-13 DIAGNOSIS — E119 Type 2 diabetes mellitus without complications: Secondary | ICD-10-CM | POA: Diagnosis present

## 2021-01-13 DIAGNOSIS — R1310 Dysphagia, unspecified: Secondary | ICD-10-CM | POA: Diagnosis present

## 2021-01-13 DIAGNOSIS — M5442 Lumbago with sciatica, left side: Secondary | ICD-10-CM | POA: Diagnosis present

## 2021-01-13 DIAGNOSIS — I10 Essential (primary) hypertension: Secondary | ICD-10-CM | POA: Diagnosis present

## 2021-01-13 DIAGNOSIS — S32030A Wedge compression fracture of third lumbar vertebra, initial encounter for closed fracture: Secondary | ICD-10-CM | POA: Diagnosis present

## 2021-01-13 DIAGNOSIS — M5459 Other low back pain: Secondary | ICD-10-CM | POA: Diagnosis not present

## 2021-01-13 DIAGNOSIS — K760 Fatty (change of) liver, not elsewhere classified: Secondary | ICD-10-CM | POA: Diagnosis present

## 2021-01-13 DIAGNOSIS — E875 Hyperkalemia: Secondary | ICD-10-CM | POA: Diagnosis present

## 2021-01-13 DIAGNOSIS — E785 Hyperlipidemia, unspecified: Secondary | ICD-10-CM | POA: Diagnosis present

## 2021-01-13 DIAGNOSIS — M549 Dorsalgia, unspecified: Secondary | ICD-10-CM | POA: Diagnosis not present

## 2021-01-13 DIAGNOSIS — E43 Unspecified severe protein-calorie malnutrition: Secondary | ICD-10-CM | POA: Diagnosis present

## 2021-01-13 DIAGNOSIS — E876 Hypokalemia: Secondary | ICD-10-CM | POA: Diagnosis present

## 2021-01-13 DIAGNOSIS — R000 Tachycardia, unspecified: Secondary | ICD-10-CM | POA: Diagnosis not present

## 2021-01-13 DIAGNOSIS — G893 Neoplasm related pain (acute) (chronic): Secondary | ICD-10-CM | POA: Diagnosis present

## 2021-01-13 LAB — BASIC METABOLIC PANEL
Anion gap: 9 (ref 5–15)
BUN: 15 mg/dL (ref 8–23)
CO2: 28 mmol/L (ref 22–32)
Calcium: 7.9 mg/dL — ABNORMAL LOW (ref 8.9–10.3)
Chloride: 96 mmol/L — ABNORMAL LOW (ref 98–111)
Creatinine, Ser: 0.53 mg/dL — ABNORMAL LOW (ref 0.61–1.24)
GFR, Estimated: >60 mL/min (ref >60)
Glucose, Bld: 93 mg/dL (ref 70–99)
Potassium: 4.8 mmol/L (ref 3.5–5.1)
Sodium: 133 mmol/L — ABNORMAL LOW (ref 135–145)

## 2021-01-13 LAB — MAGNESIUM: Magnesium: 1.8 mg/dL (ref 1.7–2.4)

## 2021-01-13 LAB — HEMOGLOBIN A1C
Hgb A1c MFr Bld: 6.1 % — ABNORMAL HIGH (ref 4.8–5.6)
Mean Plasma Glucose: 128.37 mg/dL

## 2021-01-13 MED ORDER — POLYETHYLENE GLYCOL 3350 17 G PO PACK
17.0000 g | PACK | Freq: Every day | ORAL | Status: DC
  Administered 2021-01-13 – 2021-01-19 (×6): 17 g via ORAL
  Filled 2021-01-13 (×6): qty 1

## 2021-01-13 MED ORDER — LORAZEPAM 2 MG/ML IJ SOLN
1.0000 mg | Freq: Once | INTRAMUSCULAR | Status: AC
  Administered 2021-01-13: 1 mg via INTRAVENOUS
  Filled 2021-01-13: qty 1

## 2021-01-13 MED ORDER — GADOBUTROL 1 MMOL/ML IV SOLN
7.5000 mL | Freq: Once | INTRAVENOUS | Status: DC | PRN
  Filled 2021-01-13: qty 7.5

## 2021-01-13 MED ORDER — SENNA 8.6 MG PO TABS
2.0000 | ORAL_TABLET | Freq: Every day | ORAL | Status: DC
  Administered 2021-01-13 – 2021-01-16 (×4): 17.2 mg via ORAL
  Filled 2021-01-13 (×4): qty 2

## 2021-01-13 MED ORDER — HYDROMORPHONE HCL 1 MG/ML IJ SOLN
2.0000 mg | INTRAMUSCULAR | Status: DC | PRN
  Administered 2021-01-13 – 2021-01-18 (×36): 2 mg via INTRAVENOUS
  Filled 2021-01-13 (×37): qty 2

## 2021-01-13 MED ORDER — FENTANYL 25 MCG/HR TD PT72
1.0000 | MEDICATED_PATCH | TRANSDERMAL | Status: AC
  Administered 2021-01-13 – 2021-01-16 (×2): 1 via TRANSDERMAL
  Filled 2021-01-13 (×2): qty 1

## 2021-01-13 MED ORDER — METFORMIN HCL 500 MG PO TABS
500.0000 mg | ORAL_TABLET | Freq: Every day | ORAL | Status: DC
  Administered 2021-01-13 – 2021-01-19 (×6): 500 mg via ORAL
  Filled 2021-01-13 (×6): qty 1

## 2021-01-13 MED ORDER — PREDNISONE 20 MG PO TABS
40.0000 mg | ORAL_TABLET | Freq: Every day | ORAL | Status: DC
  Administered 2021-01-13 – 2021-01-16 (×4): 40 mg via ORAL
  Filled 2021-01-13 (×4): qty 2

## 2021-01-13 MED ORDER — ISOSORBIDE MONONITRATE ER 30 MG PO TB24
30.0000 mg | ORAL_TABLET | Freq: Every day | ORAL | Status: DC
  Administered 2021-01-13 – 2021-01-19 (×7): 30 mg via ORAL
  Filled 2021-01-13 (×7): qty 1

## 2021-01-13 MED ORDER — GADOBUTROL 1 MMOL/ML IV SOLN
5.0000 mL | Freq: Once | INTRAVENOUS | Status: AC | PRN
  Administered 2021-01-13: 5 mL via INTRAVENOUS
  Filled 2021-01-13: qty 6

## 2021-01-13 NOTE — Progress Notes (Signed)
OT Cancellation Note  Patient Details Name: Donald Lowery MRN: [id number] DOB: [birth year]   Cancelled Treatment:    Reason Eval/Treat Not Completed: Medical issues which prohibited therapy.Pt with pending MRI and unable to participate in therapeutic  intervention secondary increased pain. OT to re-attempt at next available time.  Darleen Crocker, MS, OTR/L , CBIS ascom [phone]  01/13/21, 10:45 AM

## 2021-01-13 NOTE — Progress Notes (Addendum)
PROGRESS NOTE    Donald Lowery  OAC:[id number] DOB: [birth year] DOA: 01/12/2021 PCP: [name], NP  Outpatient Specialists: oncology, cardiology    Brief Narrative:   Donald Lowery is a 70 y.o. 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  No falls, no trauma.  He was seen yesterday by his oncologist and had been on prednisone taper pack which helped relieve his pain.  He denies any chest pain shortness of breath cough or hemoptysis.  He denies any abdominal pain.  No nausea or vomiting, constipation or diarrhea.  No urinary symptoms.  EDP requested admission for symptoms management and pain control.   Assessment & Plan:   Active Problems:   Intractable back pain  # Intractable pain secondary to bony metastases # Metastatic lung cancer. Known mets to left iliac crest; current pain right iliac rest region. Lumbar spine x ray showing new L3 compression deformity. Unable to ambulate secondary to pain. On ms contin 15 bid at home with oxy 10 prn. Pain this morning not relieved from meds he has received thus far. Recently completed chemotherapy; has one radiation treatment left. Has been referred to orthopedics as outpt but hasn't been yet. Recently also saw pain mgmt.  - start fentanyl patch 25 mcg - increase dilaudid to 2 mg q2 prn - miralax/senna - narcan prn - onc palliative notified - re-start prednisone @ 40 mg daily - pt/ot consults - onc discussed case w/ rad onc, plan for rad onc 9/19 (Monday)  # New L3 compression fracture Difficulty ambulating appears to be 2/2 pain but unable to clearly  distinguish true weakness. Known metastatic lesion left iliac crest; pain now is in right - will check mri of lumbar spine and pelvis  # Hyponatremia Chronic, most likely siadh from pulmonary mets. Is at baseline - monitor  # Hypokalemia Hx of, 3.2. repleted by admission provider - repeat bmp with mg  # T2DM Here glucose wnl - home metformin - daily fasting sugar  # CAD # Hx NSTEMI Nstemi 2020, medical mgmt w/ diffuse disease on cath - cont home statin, zetia, imdur  # HTN Hx of, now off meds, bp here wnl - monitor  # Malnutrition, moderate - nutrition supplement   DVT prophylaxis: lovenox Code Status: full Family Communication: wife updated @ bedside  Level of care: Med-Surg Status is: Observation  The patient will require care spanning > 2 midnights and should be moved to inpatient because: Inpatient level of care appropriate due to severity of illness  Dispo:  Patient From: Home  Planned Disposition: tbd  Medically stable for discharge: No       Consultants:  Oncology notified  Procedures: none  Antimicrobials:  none    Subjective: Says pain is severe and has not been relieved, mainly right iliac crest. Mild low back pain. Tolerating diet, no n/v/d.   Objective: Vitals:   01/13/21 0615 01/13/21 0630 01/13/21 0645 01/13/21 0700  BP: 126/67 (!) 126/57 123/60 121/66  Pulse:      Resp:      Temp:      TempSrc:      SpO2:      Weight:  Height:        Intake/Output Summary (Last 24 hours) at 01/13/2021 0752 Last data filed at 01/12/2021 2145 Gross per 24 hour  Intake 1000 ml  Output --  Net 1000 ml   Filed Weights   01/12/21 1519  Weight: 53.3 kg    Examination:  General exam: Appears calm and comfortable  Respiratory system: Clear to auscultation. Respiratory effort normal. Cardiovascular system: S1 & S2 heard, RRR. No JVD, murmurs, rubs, gallops or clicks. No pedal edema. Gastrointestinal system: Abdomen is nondistended, soft  and nontender. No organomegaly or masses felt. Normal bowel sounds heard. Central nervous system: Alert and oriented. No focal neurological deficits. Extremities: Symmetric 5 x 5 power. Skin: No rashes, lesions or ulcers Psychiatry: Judgement and insight appear normal. Mood & affect appropriate.     Data Reviewed: I have personally reviewed following labs and imaging studies  CBC: Recent Labs  Lab 01/11/21 0835 01/12/21 1816  WBC 5.2 4.2  NEUTROABS 4.3 3.6  HGB 8.7* 8.9*  HCT 26.0* 25.9*  MCV 87.0 86.9  PLT 280 656   Basic Metabolic Panel: Recent Labs  Lab 01/11/21 0835 01/12/21 1816  NA 128* 129*  K 3.1* 3.2*  CL 90* 90*  CO2 30 32  GLUCOSE 188* 132*  BUN 20 17  CREATININE 0.81 0.63  CALCIUM 8.8* 8.2*   GFR: Estimated Creatinine Clearance: 63.8 mL/min (by C-G formula based on SCr of 0.63 mg/dL). Liver Function Tests: Recent Labs  Lab 01/11/21 0835 01/12/21 1816  AST 16 19  ALT 19 20  ALKPHOS 158* 134*  BILITOT 1.0 0.6  PROT 6.4* 5.9*  ALBUMIN 3.0* 2.6*   No results for input(s): LIPASE, AMYLASE in the last 168 hours. No results for input(s): AMMONIA in the last 168 hours. Coagulation Profile: No results for input(s): INR, PROTIME in the last 168 hours. Cardiac Enzymes: No results for input(s): CKTOTAL, CKMB, CKMBINDEX, TROPONINI in the last 168 hours. BNP (last 3 results) No results for input(s): PROBNP in the last 8760 hours. HbA1C: No results for input(s): HGBA1C in the last 72 hours. CBG: Recent Labs  Lab 01/12/21 2340  GLUCAP 101*   Lipid Profile: No results for input(s): CHOL, HDL, LDLCALC, TRIG, CHOLHDL, LDLDIRECT in the last 72 hours. Thyroid Function Tests: No results for input(s): TSH, T4TOTAL, FREET4, T3FREE, THYROIDAB in the last 72 hours. Anemia Panel: No results for input(s): VITAMINB12, FOLATE, FERRITIN, TIBC, IRON, RETICCTPCT in the last 72 hours. Urine analysis: No results found for: COLORURINE, APPEARANCEUR, LABSPEC, PHURINE,  GLUCOSEU, HGBUR, BILIRUBINUR, KETONESUR, PROTEINUR, UROBILINOGEN, NITRITE, LEUKOCYTESUR Sepsis Labs: @LABRCNTIP (procalcitonin:4,lacticidven:4)  ) Recent Results (from the past 240 hour(s))  Resp Panel by RT-PCR (Flu A&B, Covid) Nasopharyngeal Swab     Status: None   Collection Time: 01/12/21  6:16 PM   Specimen: Nasopharyngeal Swab; Nasopharyngeal(NP) swabs in vial transport medium  Result Value Ref Range Status   SARS Coronavirus 2 by RT PCR NEGATIVE NEGATIVE Final    Comment: (NOTE) SARS-CoV-2 target nucleic acids are NOT DETECTED.  The SARS-CoV-2 RNA is generally detectable in upper respiratory specimens during the acute phase of infection. The lowest concentration of SARS-CoV-2 viral copies this assay can detect is 138 copies/mL. A negative result does not preclude SARS-Cov-2 infection and should not be used as the sole basis for treatment or other patient management decisions. A negative result may occur with  improper specimen collection/handling, submission of specimen other than nasopharyngeal swab, presence of viral mutation(s) within the areas targeted by this assay, and  inadequate number of viral copies(<138 copies/mL). A negative result must be combined with clinical observations, patient history, and epidemiological information. The expected result is Negative.  Fact Sheet for Patients:  EntrepreneurPulse.com.au  Fact Sheet for Healthcare Providers:  IncredibleEmployment.be  This test is no t yet approved or cleared by the [place] FDA and  has been authorized for detection and/or diagnosis of SARS-CoV-2 by FDA under an Emergency Use Authorization (EUA). This EUA will remain  in effect (meaning this test can be used) for the duration of the COVID-19 declaration under Section 564(b)(1) of the Act, 21 U.S.C.section 360bbb-3(b)(1), unless the authorization is terminated  or revoked sooner.       Influenza A by PCR NEGATIVE  NEGATIVE Final   Influenza B by PCR NEGATIVE NEGATIVE Final    Comment: (NOTE) The Xpert Xpress SARS-CoV-2/FLU/RSV plus assay is intended as an aid in the diagnosis of influenza from Nasopharyngeal swab specimens and should not be used as a sole basis for treatment. Nasal washings and aspirates are unacceptable for Xpert Xpress SARS-CoV-2/FLU/RSV testing.  Fact Sheet for Patients: EntrepreneurPulse.com.au  Fact Sheet for Healthcare Providers: IncredibleEmployment.be  This test is not yet approved or cleared by the [place] FDA and has been authorized for detection and/or diagnosis of SARS-CoV-2 by FDA under an Emergency Use Authorization (EUA). This EUA will remain in effect (meaning this test can be used) for the duration of the COVID-19 declaration under Section 564(b)(1) of the Act, 21 U.S.C. section 360bbb-3(b)(1), unless the authorization is terminated or revoked.  Performed at [facility], [address] [place], [place] [address]          Radiology Studies: DG Lumbar Spine 2-3 Views  Result Date: 01/12/2021 CLINICAL DATA:  Back pain, lung cancer, evaluate for metastases EXAM: LUMBAR SPINE - 2-3 VIEW COMPARISON:  01/03/2021 FINDINGS: Mild superior endplate compression fracture deformity at L3, with 15% loss of height, new from the prior. This reflects a pathologic fracture. Mild degenerative changes of the visualized thoracolumbar spine. Visualized bony pelvis appears intact. IMPRESSION: Mild superior endplate compression fracture deformity at L3, with 15% loss of height, new from the prior. This reflects a pathologic fracture. Electronically Signed   By: Julian Hy M.D.   On: 01/12/2021 20:04        Scheduled Meds:  aspirin EC  81 mg Oral Daily   atorvastatin  80 mg Oral q1800   enoxaparin (LOVENOX) injection  40 mg Subcutaneous Q24H   ezetimibe  10 mg Oral Daily   feeding supplement  237 mL Oral BID BM    insulin aspart  0-5 Units Subcutaneous QHS   insulin aspart  0-9 Units Subcutaneous TID WC   isosorbide mononitrate  30 mg Oral Daily   lidocaine  1 patch Transdermal Q12H   metFORMIN  500 mg Oral q morning   oxyCODONE  10 mg Oral Q12H   potassium chloride  40 mEq Oral BID   predniSONE  40 mg Oral Q breakfast   senna  2 tablet Oral BID   vitamin B-12  1,000 mcg Oral Daily   Continuous Infusions:  0.9 % NaCl with KCl 40 mEq / L 50 mL/hr at 01/12/21 2101     LOS: 0 days    Time spent: [place], MD [facility]   If 7PM-7AM, please contact night-coverage [email or web address] Password [facility] 01/13/2021, 7:52 AM

## 2021-01-13 NOTE — ED Notes (Signed)
Waiting for room 149 to be cleaned  131 is not an appropriate room for this pt

## 2021-01-13 NOTE — ED Notes (Signed)
Floor rejected patient due to elevated pulse and red MEWS score. Dr Si Raider messaged.

## 2021-01-13 NOTE — ED Notes (Signed)
Informed RN bed assigned 

## 2021-01-13 NOTE — Progress Notes (Signed)
PT Cancellation Note  Patient Details Name: PEIRCE DEVENEY MRN: [id number] DOB: 1951/04/27   Cancelled Treatment:    Reason Eval/Treat Not Completed: Pain limiting ability to participate;Patient not medically ready. Patient has additional MRIs pending, per RN patient is too painful to participate at this time. Will re-attempt when medically ready.    Raniah Karan 01/13/2021, 10:32 AM

## 2021-01-13 NOTE — ED Notes (Signed)
Entered room for hourly rounding. Pt asked to be pulled up in bed and head of bed raised. Pt repositioned and stated he felt somewhat more comfortable but his pain was 9/10. Pt placed on full monitoring and given 2mg  dilaudid as directed in PRN medications. Pt placed on 2L O2 for comfort.

## 2021-01-14 DIAGNOSIS — L8990 Pressure ulcer of unspecified site, unspecified stage: Secondary | ICD-10-CM | POA: Insufficient documentation

## 2021-01-14 LAB — BASIC METABOLIC PANEL
Anion gap: 8 (ref 5–15)
BUN: 18 mg/dL (ref 8–23)
CO2: 27 mmol/L (ref 22–32)
Calcium: 8.1 mg/dL — ABNORMAL LOW (ref 8.9–10.3)
Chloride: 94 mmol/L — ABNORMAL LOW (ref 98–111)
Creatinine, Ser: 0.58 mg/dL — ABNORMAL LOW (ref 0.61–1.24)
GFR, Estimated: >60 mL/min (ref >60)
Glucose, Bld: 113 mg/dL — ABNORMAL HIGH (ref 70–99)
Potassium: 5.1 mmol/L (ref 3.5–5.1)
Sodium: 129 mmol/L — ABNORMAL LOW (ref 135–145)

## 2021-01-14 LAB — GLUCOSE, CAPILLARY
Glucose-Capillary: 119 mg/dL — ABNORMAL HIGH (ref 70–99)
Glucose-Capillary: 136 mg/dL — ABNORMAL HIGH (ref 70–99)
Glucose-Capillary: 171 mg/dL — ABNORMAL HIGH (ref 70–99)

## 2021-01-14 LAB — MAGNESIUM: Magnesium: 1.8 mg/dL (ref 1.7–2.4)

## 2021-01-14 MED ORDER — ADULT MULTIVITAMIN W/MINERALS CH
1.0000 | ORAL_TABLET | Freq: Every day | ORAL | Status: DC
  Administered 2021-01-15 – 2021-01-19 (×4): 1 via ORAL
  Filled 2021-01-14 (×4): qty 1

## 2021-01-14 MED ORDER — ENSURE ENLIVE PO LIQD
237.0000 mL | Freq: Three times a day (TID) | ORAL | Status: DC
  Administered 2021-01-15 – 2021-01-19 (×6): 237 mL via ORAL

## 2021-01-14 MED ORDER — ASCORBIC ACID 500 MG PO TABS
500.0000 mg | ORAL_TABLET | Freq: Two times a day (BID) | ORAL | Status: DC
  Administered 2021-01-14 – 2021-01-19 (×9): 500 mg via ORAL
  Filled 2021-01-14 (×9): qty 1

## 2021-01-14 NOTE — Evaluation (Signed)
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  No falls, no trauma.  He was seen yesterday by his oncologist and had been on prednisone taper pack which helped relieve his pain. Pt adm for intractable back pain 2/2 bony metastases.   Clinical Impression   Pt seen for OT evaluation this date in setting of acute hospitalization d/t intractable back pain. Pt's son present and reports that pt is INDEP for fxl mobility and self care at baseline, but has recently been requiring more assist from family and they recently purchased a rollator. Pt presents this date with pain and weakness impacting his ability to safely and efficiently perform ADLs/ADL mobility. On ADL assessment, pt currently requires: SETUP to MIN A for seated UB ADLs, MOD to MAX A for seated LB ADLs 2/2 back pain. Pt requires MIN A for ADL transfers and RW for UE Support in standing and with fxl mobility. Anticipate pt will require family support, below listed equipment and f/u HHOT to ensure safety with ADLs in the natural environment.      Recommendations for follow up therapy are one component of a multi-disciplinary discharge planning process, led by the attending physician.  Recommendations may be updated based on patient status, additional functional criteria and insurance authorization.   Follow Up Recommendations  Home health OT;Supervision/Assistance - 24 hour    Equipment Recommendations  3 in 1 bedside commode;Tub/shower seat     Recommendations for Other Services       Precautions / Restrictions Precautions Precautions: Fall Restrictions Weight Bearing Restrictions: No      Mobility Bed Mobility Overal bed mobility: Needs Assistance Bed Mobility: Sidelying to Sit;Sit to Sidelying     Supine to sit: Mod assist   Sit to sidelying: Min assist      Transfers Overall transfer level: Needs assistance Equipment used: Rolling walker (2 wheeled) Transfers: Sit to/from Stand Sit to Stand: Min assist         General transfer comment: cues for hand palcement with RW    Balance Overall balance assessment: Needs assistance Sitting-balance support: Feet supported;Bilateral upper extremity supported Sitting balance-Leahy Scale: Fair     Standing balance support: Bilateral upper extremity supported Standing balance-Leahy Scale: Fair                             ADL either performed or assessed with clinical judgement   ADL Overall ADL's : Needs assistance/impaired                                       General ADL Comments: SETUP to MIN A for seated UB ADLs, MOD to MAX A for seated LB ADLs 2/2 back pain. Pt requires MIN A for ADL transfers and RW for UE Support in standing and with fxl mobility.     Vision Patient Visual Report: No change from baseline  Perception     Praxis      Pertinent Vitals/Pain Pain Assessment: 0-10 Pain Score: 10-Worst pain ever Pain Location: back and in standing "all over" Pain Descriptors / Indicators: Aching;Sore;Grimacing Pain Intervention(s): Limited activity within patient's tolerance;Monitored during session;Repositioned (side-lying in bed on R side)     Hand Dominance     Extremity/Trunk Assessment Upper Extremity Assessment Upper Extremity Assessment: Generalized weakness   Lower Extremity Assessment Lower Extremity Assessment: Generalized weakness       Communication Communication Communication: No  difficulties   Cognition Arousal/Alertness: Awake/alert (some drowsiness throughout, possibly 2/2 pain medication) Behavior During Therapy: WFL for tasks assessed/performed Overall Cognitive Status: Difficult to assess                                 General Comments: pt somewhat drowsy, perseverative on pain with mobilization. He is oriented to self and situation but not time. Able to follow all commands, but requires some increased processing time, could be 2/2 drowsiness from pain medication per son who is present in the room   General Comments       Exercises Other Exercises Other Exercises: OT ed with pt and family members re: role of OT in acute setting, recommendations and benefits for OOB activity. Pt's son present in room throughout and with good understanding   Shoulder Instructions      Home Living Family/patient expects to be discharged to:: Private residence Living Arrangements: Spouse/significant other Available Help at Discharge: Family;Available 24 hours/day Type of Home: House Home Access: Stairs to enter                     Home Equipment: [place] - 4 wheels;Cane - single point          Prior Functioning/Environment Level of Independence: Independent        Comments: pt's son present in room reports that pt is typically INDEP with ADLs and fxl mobility with no use of AD. States they have recently purchased him a rollator.        OT Problem List: Decreased strength;Decreased activity tolerance;Impaired balance (sitting and/or standing);Decreased knowledge of use of DME or AE;Pain      OT Treatment/Interventions: Self-care/ADL training;Therapeutic exercise;Energy conservation;DME and/or AE instruction;Therapeutic activities;Patient/family education    OT Goals(Current goals can be found in the care plan section) Acute Rehab OT Goals Patient Stated Goal: to reduce pain and go home OT Goal Formulation: With patient/family Time For  Goal Achievement: 01/28/21 Potential to Achieve Goals: Good ADL Goals Pt Will Perform Lower Body Dressing: with supervision;with adaptive equipment;sit to/from stand (AE to reduce hip/trunk flexion/reduce pain in back) Pt Will Transfer to Toilet: with supervision;ambulating;grab bars Pt Will Perform Toileting - Clothing Manipulation and hygiene: with supervision;sit to/from stand;with adaptive equipment  OT Frequency: Min 1X/week   Barriers to D/C:            Co-evaluation              AM-PAC OT "6 Clicks" Daily Activity     Outcome Measure Help from another person eating meals?: None Help from another person taking care of personal grooming?: A Little Help from another person toileting, which includes using toliet, bedpan, or urinal?: A Lot Help from another person bathing (including washing, rinsing, drying)?: A Lot Help from another person to put on and taking off regular upper body clothing?: A Little Help from another person to put  on and taking off regular lower body clothing?: A Lot 6 Click Score: 16   End of Session Equipment Utilized During Treatment: Gait belt;Rolling walker Nurse Communication: Mobility status  Activity Tolerance: Patient tolerated treatment well Patient left: in bed;with call bell/phone within reach;with bed alarm set;with family/visitor present  OT Visit Diagnosis: Unsteadiness on feet (R26.81);Muscle weakness (generalized) (M62.81);Pain Pain - part of body:  (back)                Time: 1400-1426 OT Time Calculation (min): 26 min Charges:  OT General Charges $OT Visit: 1 Visit OT Evaluation $OT Eval Moderate Complexity: 1 Mod OT Treatments $Therapeutic Activity: 8-22 mins  Gerrianne Scale, MS, OTR/L ascom [phone] 01/14/21, 3:19 PM

## 2021-01-14 NOTE — Progress Notes (Signed)
PT Cancellation Note  Patient Details Name: Donald Lowery MRN: [id number] DOB: [birth year]   Cancelled Treatment:    Reason Eval/Treat Not Completed: Other (comment): LSO ordered but not yet arrived.  Per Dr. Si Raider hold PT evaluation until LSO can be donned. Will attempt to see pt at a future date/time as medically appropriate.     Linus Salmons PT, DPT 01/14/21, 4:41 PM

## 2021-01-14 NOTE — Progress Notes (Signed)
Clinical/Bedside Swallow Evaluation Patient Details  Name: Donald Lowery MRN: [id number] Date of Birth: 04/08/1951  Today's Date: 01/14/2021 Time: SLP Start Time (ACUTE ONLY): 75 SLP Stop Time (ACUTE ONLY): 0945 SLP Time Calculation (min) (ACUTE ONLY): 30 min  Past Medical History:  Past Medical History:  Diagnosis Date   Aortic atherosclerosis ([place])    [place] hernia 09/21/2020   fatty   Coronary artery disease    a. 04/2018 NSTEMI/Cath: LM nl, LAD 50p, 22m/d diffuse-small caliber, LCX heavily Ca2+ sev prox/mid dzs, RCA 90 diff distal dzs into RPL and RPDA. EF 50-55%-->Med Rx.   Hepatic steatosis    History of 2019 novel coronavirus disease (COVID-19) 10/12/2020   History of echocardiogram    a. 04/2018 Echo: EF 55-60%, no rwma, mild MR, mildly dil LA. Nl RV fxn.   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 Quit 04/2018.   Past Surgical History:  Past Surgical History:  Procedure Laterality Date   BRAIN SURGERY     CARDIAC CATHETERIZATION     IR IMAGING GUIDED PORT INSERTION  10/28/2020   LEFT HEART CATH AND CORONARY ANGIOGRAPHY N/A 05/16/2018   Procedure: LEFT HEART CATH AND CORONARY ANGIOGRAPHY poss PCI;  Surgeon: [name], MD;  Location: [facility];  Service: Cardiovascular;  Laterality: N/A;   VIDEO BRONCHOSCOPY WITH ENDOBRONCHIAL NAVIGATION N/A 10/24/2020   Procedure: ROBOTIC ASSISTED VIDEO BRONCHOSCOPY WITH ENDOBRONCHIAL NAVIGATION;  Surgeon: Tyler Pita, MD;  Location: [facility];  Service: Pulmonary;  Laterality: N/A;   HPI:  Donald Lowery is a 70 y.o. 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 CT chest 09/21/20: "7cm left upper lobe mass with mediastinal invasion and left subclavian/proximal vertebral encasement. The mass contacts the esophagus, trachea, and thoracic spine without visible invasion of  these structures." PET scan 10/05/20 revealed no lesions of pharyngeal mucosa space, however, "asymmetric increased metabolic activity within the right vocal cord likely related to left vocal cord paralysis." Per RN intermittent coughing with POs.    Assessment / Plan / Recommendation  Clinical Impression   Patient presents with increased risk for aspiration given appearance of left vocal cord paralysis per PET imaging. Spouse reports onset of hoarseness prior to beginning radiation for lung cancer; onset of swallowing difficulty at that time. Reports pt has not been coughing until recently; swallowing difficulty was that it was "hard to swallow." Patient had cough x1 with initial sip of thin liquid; subsequently no further overt s/sx aspiration observed. Belching throughout assessment suggestive of possible esophageal dysphagia contributing. Patient self-limits to small, careful bites and sips; very minimal intake due to appetite. Mastication is prolonged due to absence of dentition (has top dentures only). No CXR this admission; SLP spoke with pt and wife regarding aspiration risk in context of vocal cord paralysis as well as pt's overall deconditioning. Educated that given possibility of CN deficits other mechanisms of swallowing may be affected, and the only way to identify this is by objective study (MBSS). They are unsure of whether he would like to proceed with this at this time. Educated that SLP will follow and  if pt not tolerating current diet or has decline in pulmonary function, MBSS is advised. Recommend dysphagia 3 (mechanical soft) diet to reduce efforts of mastication, thin liquids with aspiration precautions: slow rate, small bites and sips, upright position during and 30 minutes  after POs. ENT consult may be beneficial given relatively recent onset of hoarseness, difficulty swallowing, and presence of metastatic disease.    SLP Visit Diagnosis: Dysphagia, oropharyngeal phase (R13.12)    Aspiration Risk  Mild aspiration risk;Moderate aspiration risk    Diet Recommendation Dysphagia 3 (Mech soft);Thin liquid   Liquid Administration via: Cup;Straw Medication Administration: Whole meds with liquid (can give whole in puree if has difficulty with liquid) Supervision: Patient able to self feed;Comment (assist as needed due to weakness; assist with positioning) Compensations: Slow rate;Small sips/bites Postural Changes: Seated upright at 90 degrees;Remain upright for at least 30 minutes after po intake    Other  Recommendations Recommended Consults: Consider ENT evaluation Oral Care Recommendations: Oral care BID    Recommendations for follow up therapy are one component of a multi-disciplinary discharge planning process, led by the attending physician.  Recommendations may be updated based on patient status, additional functional criteria and insurance authorization.  Follow up Recommendations Other (comment) (tbd)      Frequency and Duration min 2x/week  2 weeks       Prognosis Prognosis for Safe Diet Advancement: Fair Barriers to Reach Goals: Other (Comment) (comorbidities)      Swallow Study   General Date of Onset: 01/12/21 HPI: Donald Lowery is a 70 y.o. 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 CT chest 09/21/20: "7cm left upper lobe mass with mediastinal invasion and left subclavian/proximal vertebral encasement. The mass contacts the esophagus, trachea, and thoracic spine without visible invasion of  these structures." PET scan 10/05/20 revealed no lesions of pharyngeal mucosa space, however, "asymmetric increased metabolic activity within the right vocal cord  likely related to left vocal cord paralysis." Per RN intermittent coughing with POs. Type of Study: Bedside Swallow Evaluation Previous Swallow Assessment: none on file Diet Prior to this Study: Regular;Thin liquids Temperature Spikes Noted: No Respiratory Status: Nasal cannula (2L) History of Recent Intubation: No Behavior/Cognition: Alert;Requires cueing Oral Cavity Assessment: Within Functional Limits Oral Care Completed by SLP: No Oral Cavity - Dentition: Dentures, top;Edentulous Vision: Functional for self-feeding Self-Feeding Abilities: Needs assist (normally feeds self, "I'd prefer if you do it.") Patient Positioning: Upright in bed Baseline Vocal Quality: Hoarse;Suspected CN X (Vagus) involvement Volitional Cough: Weak;Congested Volitional Swallow: Unable to elicit (xerostomia)    Oral/Motor/Sensory Function Overall Oral Motor/Sensory Function: Generalized oral weakness   Ice Chips Ice chips: Within functional limits Presentation: Spoon   Thin Liquid Thin Liquid: Impaired Presentation: Cup;Straw Pharyngeal  Phase Impairments: Cough - Immediate (x1 on initial presentation)    Nectar Thick Nectar Thick Liquid: Not tested   Honey Thick Honey Thick Liquid: Not tested   Puree Puree: Within functional limits Presentation: Spoon   Solid     Solid: Impaired Presentation: Spoon Oral Phase Impairments: Impaired mastication Oral Phase Functional Implications: Impaired mastication     Deneise Lever, MS, CCC-SLP Speech-Language Pathologist  Aliene Altes 01/14/2021,10:01 AM

## 2021-01-14 NOTE — Progress Notes (Signed)
Initial Nutrition Assessment  DOCUMENTATION CODES:   Not applicable  INTERVENTION:   Ensure Enlive po TID, each supplement provides 350 kcal and 20 grams of protein  MVI po daily   Vitamin C 500mg  po BID  Pt at high refeed risk; recommend monitor potassium, magnesium and phosphorus labs daily until stable  NUTRITION DIAGNOSIS:   Increased nutrient needs related to cancer and cancer related treatments as evidenced by estimated needs.  GOAL:   Patient will meet greater than or equal to 90% of their needs  MONITOR:   PO intake, Supplement acceptance, Labs, Weight trends, Skin, I & O's  REASON FOR ASSESSMENT:   Malnutrition Screening Tool    ASSESSMENT:   70 y/o male with h/o metastatic NSCLC, CVA, HLD, HTN, DM and NSTEMI who is admitted with intractable lower back pain, gradually worsening for the past 3 weeks.  RD working remotely.  Spoke with pt's wife via phone. Pt reports pt with intermittent poor appetite and oral intake at baseline but reports that his appetite has been poor now for several weeks. Per chart, pt is down 43lbs(27%) since May; this is significant weight loss. Wife reports that pt has been drinking Glucerna, Boost and Ensure at home, 2-3 per day. Pt has also been drinking lots of milk and chocolate milk with his meals. Pt seen by SLP today and placed on a mechanical soft diet. RD will add supplements and vitamins to help pt meet his estimated needs and to support wound healing (prefers chocolate). Pt is likely at high refeed risk. Pt is followed by the RD at the cancer center. RD will obtain NFPE at follow up. Pt is at high risk for malnutrition.   Medications reviewed and include: aspirin, lovenox, metformin, MVI, miralax, prednisone, senokot, B12  Labs reviewed: Na 129(L), K 5.1 wnl, creat 0.58(L), Mg 1.8 wnl Hgb 8.9(L), Hct 25.9(L) AIC 6.1(H)- 9/15  NUTRITION - FOCUSED PHYSICAL EXAM: Unable to perform at this time  Diet Order:   Diet Order              DIET DYS 3 Room service appropriate? Yes; Fluid consistency: Thin  Diet effective now                  EDUCATION NEEDS:   Education needs have been addressed  Skin:  Skin Assessment: Reviewed RN Assessment (Stage II sacrum)  Last BM:  pta  Height:   Ht Readings from Last 1 Encounters:  01/13/21 5\' 4"  (1.626 m)    Weight:   Wt Readings from Last 1 Encounters:  01/13/21 53 kg    Ideal Body Weight:  59 kg  BMI:  Body mass index is 20.06 kg/m.  Estimated Nutritional Needs:   Kcal:  1700-2000kcal/day  Protein:  85-100g/day  Fluid:  1.4-1.6L/day  Koleen Distance MS, RD, LDN Please refer to [facility] for RD and/or RD on-call/weekend/after hours pager

## 2021-01-14 NOTE — Progress Notes (Addendum)
PROGRESS NOTE    Donald Lowery  XBJ:[id number] DOB: 1951/02/04 DOA: 01/12/2021 PCP: [name], NP  Outpatient Specialists: oncology, cardiology    Brief Narrative:   Donald Lowery is a 70 y.o. 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  No falls, no trauma.  He was seen yesterday by his oncologist and had been on prednisone taper pack which helped relieve his pain.  He denies any chest pain shortness of breath cough or hemoptysis.  He denies any abdominal pain.  No nausea or vomiting, constipation or diarrhea.  No urinary symptoms.  EDP requested admission for symptoms management and pain control.   Assessment & Plan:   Active Problems:   Intractable back pain   Intractable pain   Pressure injury of skin  # Intractable pain secondary to bony metastases # Metastatic lung cancer. Known mets to left iliac crest; current pain right iliac rest region. Lumbar spine x ray showing new L3 compression deformity. Unable to ambulate secondary to pain. On ms contin 15 bid at home with oxy 10 prn. Pain this morning not relieved from meds he has received thus far. Recently completed chemotherapy; has one radiation treatment left. Has been referred to orthopedics as outpt but hasn't been yet. Recently also saw pain mgmt.  - started fentanyl patch 25 mcg - increased dilaudid to 2 mg q2 prn - miralax/senna - narcan prn - onc palliative notified - re-started prednisone @ 40 mg daily - pt/ot consults pending - onc discussed case w/ rad onc, plan for rad onc 9/19 (Monday)  # Dysphagia SLP consulted. Noted left  vocal cord paralysis on recent PET scan, may contribute to dysphagia - dysphagia diet - consider outpt ENT f/u  # New L3 compression fracture Difficulty ambulating appears to be 2/2 pain but unable to clearly distinguish true weakness. MRI shows l3 pathologic fracture with extension of tumor into canal resulting in severe canal and foraminal stenosis. Discussed w/ neurosurgery, thinks kyphoplasty may be warranted - neurosurg to discuss w/ ortho Rudene Christians) regarding kyphoplasty - advising lso brace for now (ordered)  # Hyponatremia Chronic, most likely siadh from pulmonary mets. Is at baseline - monitor  # Hypokalemia Resolved  # T2DM Here glucose wnl - home metformin - daily fasting sugar  # CAD # Hx NSTEMI Nstemi 2020, medical mgmt w/ diffuse disease on cath - cont home statin, zetia, imdur  # HTN Hx of, now off meds, bp here wnl - monitor  # Malnutrition, moderate - nutrition supplement   DVT prophylaxis: lovenox Code Status: full Family Communication: wife updated @ bedside 9/17  Level of care: Progressive Cardiac Status is: inpt  The patient will require care spanning > 2 midnights and should be moved to inpatient because: Inpatient level of care appropriate due to severity of illness  Dispo:  Patient From: Home  Planned Disposition: tbd  Medically stable for discharge: No       Consultants:  Oncology notified  Procedures: none  Antimicrobials:  none    Subjective: Says pain is severe and has not been relieved, mainly right iliac crest. Mild low back pain. Tolerating diet, no n/v/d.   Objective: Vitals:  01/13/21 2345 01/14/21 0349 01/14/21 0842 01/14/21 1137  BP:  (!) 120/59 136/72 131/72  Pulse:  (!) 115 (!) 110 99  Resp:  18 (!) 22 17  Temp:  98.7 F (37.1 C)  98.3 F (36.8 C)  TempSrc:  Oral    SpO2:  95% 96% 99%  Weight: 53 kg     Height: 5\' 4"  (1.626 m)       Intake/Output Summary (Last 24 hours) at 01/14/2021 1333 Last data filed  at 01/14/2021 1015 Gross per 24 hour  Intake 680 ml  Output 400 ml  Net 280 ml   Filed Weights   01/12/21 1519 01/13/21 2345  Weight: 53.3 kg 53 kg    Examination:  General exam: Appears calm and comfortable  Respiratory system: Clear to auscultation. Respiratory effort normal. Cardiovascular system: S1 & S2 heard, RRR. No JVD, murmurs, rubs, gallops or clicks. No pedal edema. Gastrointestinal system: Abdomen is nondistended, soft and nontender. No organomegaly or masses felt. Normal bowel sounds heard. Central nervous system: Alert and oriented. No focal neurological deficits. Extremities: Symmetric 5 x 5 power. Skin: No rashes, lesions or ulcers Psychiatry: Judgement and insight appear normal. Mood & affect appropriate.     Data Reviewed: I have personally reviewed following labs and imaging studies  CBC: Recent Labs  Lab 01/11/21 0835 01/12/21 1816  WBC 5.2 4.2  NEUTROABS 4.3 3.6  HGB 8.7* 8.9*  HCT 26.0* 25.9*  MCV 87.0 86.9  PLT 280 240   Basic Metabolic Panel: Recent Labs  Lab 01/11/21 0835 01/12/21 1816 01/13/21 1452 01/14/21 0551  NA 128* 129* 133* 129*  K 3.1* 3.2* 4.8 5.1  CL 90* 90* 96* 94*  CO2 30 32 28 27  GLUCOSE 188* 132* 93 113*  BUN 20 17 15 18   CREATININE 0.81 0.63 0.53* 0.58*  CALCIUM 8.8* 8.2* 7.9* 8.1*  MG  --   --  1.8 1.8   GFR: Estimated Creatinine Clearance: 63.5 mL/min (A) (by C-G formula based on SCr of 0.58 mg/dL (L)). Liver Function Tests: Recent Labs  Lab 01/11/21 0835 01/12/21 1816  AST 16 19  ALT 19 20  ALKPHOS 158* 134*  BILITOT 1.0 0.6  PROT 6.4* 5.9*  ALBUMIN 3.0* 2.6*   No results for input(s): LIPASE, AMYLASE in the last 168 hours. No results for input(s): AMMONIA in the last 168 hours. Coagulation Profile: No results for input(s): INR, PROTIME in the last 168 hours. Cardiac Enzymes: No results for input(s): CKTOTAL, CKMB, CKMBINDEX, TROPONINI in the last 168 hours. BNP (last 3 results) No results for  input(s): PROBNP in the last 8760 hours. HbA1C: Recent Labs    01/12/21 1816  HGBA1C 6.1*   CBG: Recent Labs  Lab 01/12/21 2340 01/14/21 0844 01/14/21 1138  GLUCAP 101* 119* 136*   Lipid Profile: No results for input(s): CHOL, HDL, LDLCALC, TRIG, CHOLHDL, LDLDIRECT in the last 72 hours. Thyroid Function Tests: No results for input(s): TSH, T4TOTAL, FREET4, T3FREE, THYROIDAB in the last 72 hours. Anemia Panel: No results for input(s): VITAMINB12, FOLATE, FERRITIN, TIBC, IRON, RETICCTPCT in the last 72 hours. Urine analysis: No results found for: COLORURINE, APPEARANCEUR, LABSPEC, PHURINE, GLUCOSEU, HGBUR, BILIRUBINUR, KETONESUR, PROTEINUR, UROBILINOGEN, NITRITE, LEUKOCYTESUR Sepsis Labs: @LABRCNTIP (procalcitonin:4,lacticidven:4)  ) Recent Results (from the past 240 hour(s))  Resp Panel by RT-PCR (Flu A&B, Covid) Nasopharyngeal Swab     Status: None   Collection Time: 01/12/21  6:16 PM   Specimen: Nasopharyngeal Swab; Nasopharyngeal(NP) swabs in vial transport medium  Result Value  Ref Range Status   SARS Coronavirus 2 by RT PCR NEGATIVE NEGATIVE Final    Comment: (NOTE) SARS-CoV-2 target nucleic acids are NOT DETECTED.  The SARS-CoV-2 RNA is generally detectable in upper respiratory specimens during the acute phase of infection. The lowest concentration of SARS-CoV-2 viral copies this assay can detect is 138 copies/mL. A negative result does not preclude SARS-Cov-2 infection and should not be used as the sole basis for treatment or other patient management decisions. A negative result may occur with  improper specimen collection/handling, submission of specimen other than nasopharyngeal swab, presence of viral mutation(s) within the areas targeted by this assay, and inadequate number of viral copies(<138 copies/mL). A negative result must be combined with clinical observations, patient history, and epidemiological information. The expected result is Negative.  Fact  Sheet for Patients:  EntrepreneurPulse.com.au  Fact Sheet for Healthcare Providers:  IncredibleEmployment.be  This test is no t yet approved or cleared by the [place] FDA and  has been authorized for detection and/or diagnosis of SARS-CoV-2 by FDA under an Emergency Use Authorization (EUA). This EUA will remain  in effect (meaning this test can be used) for the duration of the COVID-19 declaration under Section 564(b)(1) of the Act, 21 U.S.C.section 360bbb-3(b)(1), unless the authorization is terminated  or revoked sooner.       Influenza A by PCR NEGATIVE NEGATIVE Final   Influenza B by PCR NEGATIVE NEGATIVE Final    Comment: (NOTE) The Xpert Xpress SARS-CoV-2/FLU/RSV plus assay is intended as an aid in the diagnosis of influenza from Nasopharyngeal swab specimens and should not be used as a sole basis for treatment. Nasal washings and aspirates are unacceptable for Xpert Xpress SARS-CoV-2/FLU/RSV testing.  Fact Sheet for Patients: EntrepreneurPulse.com.au  Fact Sheet for Healthcare Providers: IncredibleEmployment.be  This test is not yet approved or cleared by the [place] FDA and has been authorized for detection and/or diagnosis of SARS-CoV-2 by FDA under an Emergency Use Authorization (EUA). This EUA will remain in effect (meaning this test can be used) for the duration of the COVID-19 declaration under Section 564(b)(1) of the Act, 21 U.S.C. section 360bbb-3(b)(1), unless the authorization is terminated or revoked.  Performed at [facility], [address], [place], [place] [address]          Radiology Studies: DG Lumbar Spine 2-3 Views  Result Date: 01/12/2021 CLINICAL DATA:  Back pain, lung cancer, evaluate for metastases EXAM: LUMBAR SPINE - 2-3 VIEW COMPARISON:  01/03/2021 FINDINGS: Mild superior endplate compression fracture deformity at L3, with 15% loss of  height, new from the prior. This reflects a pathologic fracture. Mild degenerative changes of the visualized thoracolumbar spine. Visualized bony pelvis appears intact. IMPRESSION: Mild superior endplate compression fracture deformity at L3, with 15% loss of height, new from the prior. This reflects a pathologic fracture. Electronically Signed   By: Julian Hy M.D.   On: 01/12/2021 20:04   MR Lumbar Spine W Wo Contrast  Result Date: 01/13/2021 CLINICAL DATA:  Spine fracture, lumbosacral, pathological new pathologic compression fracture lumbar spine EXAM: MRI LUMBAR SPINE WITHOUT AND WITH CONTRAST TECHNIQUE: Multiplanar and multiecho pulse sequences of the lumbar spine were obtained without and with intravenous contrast. CONTRAST:  11mL GADAVIST GADOBUTROL 1 MMOL/ML IV SOLN COMPARISON:  PET-CT 12/12/2020.  Lumbar radiographs 01/12/2021. FINDINGS: Segmentation: Standard segmentation is assumed. The inferior-most fully formed intervertebral disc is labeled L5-S1. Alignment:  Substantial sagittal subluxation.  Levocurvature. Vertebrae: Abnormal enhancement, T1 hypointensity and STIR hyperintensity involving the entire L3  vertebral body and bilateral L3 pedicles, compatible with metastasis. Pathologic fracture with 50% vertebral body height loss. Extraosseous extension of tumor with right anterior paraspinal soft tissue mass measuring 4.2 cm with surrounding edema/enhancement. There also is posterior epidural extension of enhancing tumor into the canal and right foramen with associated severe canal stenosis and moderate to severe right foraminal stenosis. Conus medullaris and cauda equina: Conus extends to the L1 level. Conus appears normal Paraspinal and other soft tissues: Right anterolateral paraspinal soft tissue mass at L3 with extensive surrounding edema and enhancement, as detailed above. Disc levels: T12-L1: No significant disc protrusion, foraminal stenosis, or canal stenosis. L1-L2: Mild disc bulging  without significant canal or foraminal stenosis. L2-L3: Mild disc bulging without significant canal or foraminal stenosis. L3-L4: Pathologic fracture at L3 with posterior epidural extension of tumor into the canal and right foramen, as described above. Resulting severe canal stenosis and moderate to severe right foraminal stenosis. L4-L5: Small broad disc bulge and bilateral facet hypertrophy with mild right foraminal stenosis. No significant canal or left foraminal stenosis. L5-S1: Facet hypertrophy without significant canal or foraminal stenosis. Other: Please see MRI of the pelvis for evaluation of the pelvis/sacrum. IMPRESSION: 1. Osseous metastatic disease at L3 with pathologic fracture (50% height loss) and posterior epidural extension of tumor into the canal and right foramen. Resulting severe canal stenosis and moderate to severe right foraminal stenosis. Associated right anterolateral 4.2 cm paraspinal soft tissue mass at L3 with surrounding inflammation/edema. 2. No evidence of osseous metastatic disease in the other lumbar vertebral bodies. Please see concurrent MRI of the pelvis for evaluation of the pelvis/sacrum. Electronically Signed   By: Margaretha Sheffield M.D.   On: 01/13/2021 12:13   MR PELVIS W WO CONTRAST  Result Date: 01/13/2021 CLINICAL DATA:  70 y.o. 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 CONTRAST:  46mL GADAVIST GADOBUTROL 1 MMOL/ML IV SOLN COMPARISON:  PET-CT 12/12/2020 FINDINGS: Bones: No hip fracture, dislocation or avascular necrosis. 3.3 x 3.8 cm soft tissue mass with bone destruction involving the left iliac crest with surrounding bone  marrow edema. Mild edema in the adjacent iliacus muscle which may related to radiation changes. 12 mm focus of osseous metastatic disease in the right iliac crest without a soft tissue component. 5 mm osseous metastatic disease in the S1 vertebral body. Normal sacrum and sacroiliac joints. No SI joint widening or erosive changes. Articular cartilage and labrum Articular cartilage:  No chondral defect. Labrum: Grossly intact, but evaluation is limited by lack of intraarticular fluid. Joint or bursal effusion Joint effusion:  No hip joint effusion.  No SI joint effusion. Bursae:  No bursa formation. Muscles and tendons Flexors: Mild perifascial edema involving the iliacus muscles bilaterally. Extensors: Normal. Abductors: Normal. Adductors: Normal. Gluteals: Normal. Hamstrings: Normal. Other findings No pelvic free fluid. No fluid collection or hematoma. No inguinal lymphadenopathy. No inguinal hernia. IMPRESSION: 1. Osseous metastatic disease involving the left iliac crest with a large soft tissue component measuring 3.3 x 3.8 cm and surrounding bone marrow edema. Mild edema in the adjacent iliacus muscle which may related to radiation changes. 12 mm focus of osseous metastatic disease in the right iliac crest. 5 mm osseous metastatic disease in the S1  vertebral body. 2. No hip fracture, dislocation or avascular necrosis. Electronically Signed   By: Kathreen Devoid M.D.   On: 01/13/2021 13:18        Scheduled Meds:  vitamin C  500 mg Oral BID   aspirin EC  81 mg Oral Daily   atorvastatin  80 mg Oral q1800   enoxaparin (LOVENOX) injection  40 mg Subcutaneous Q24H   ezetimibe  10 mg Oral Daily   feeding supplement  237 mL Oral TID BM   fentaNYL  1 patch Transdermal Q72H   isosorbide mononitrate  30 mg Oral Daily   lidocaine  1 patch Transdermal Q12H   metFORMIN  500 mg Oral Q breakfast   [START ON 01/15/2021] multivitamin with minerals  1 tablet Oral Daily   polyethylene glycol  17 g Oral Daily    predniSONE  40 mg Oral Q breakfast   senna  2 tablet Oral Daily   vitamin B-12  1,000 mcg Oral Daily   Continuous Infusions:     LOS: 1 day    Time spent: [place], MD [facility]   If 7PM-7AM, please contact night-coverage [email or web address] Password [email or web address] 01/14/2021, 1:33 PM

## 2021-01-15 LAB — BASIC METABOLIC PANEL
Anion gap: 5 (ref 5–15)
Anion gap: 4 — ABNORMAL LOW (ref 5–15)
Anion gap: 6 (ref 5–15)
BUN: 17 mg/dL (ref 8–23)
BUN: 17 mg/dL (ref 8–23)
BUN: 17 mg/dL (ref 8–23)
CO2: 31 mmol/L (ref 22–32)
CO2: 30 mmol/L (ref 22–32)
CO2: 30 mmol/L (ref 22–32)
Calcium: 8.9 mg/dL (ref 8.9–10.3)
Calcium: 8.6 mg/dL — ABNORMAL LOW (ref 8.9–10.3)
Calcium: 8.6 mg/dL — ABNORMAL LOW (ref 8.9–10.3)
Chloride: 89 mmol/L — ABNORMAL LOW (ref 98–111)
Chloride: 91 mmol/L — ABNORMAL LOW (ref 98–111)
Chloride: 90 mmol/L — ABNORMAL LOW (ref 98–111)
Creatinine, Ser: 0.55 mg/dL — ABNORMAL LOW (ref 0.61–1.24)
Creatinine, Ser: 0.5 mg/dL — ABNORMAL LOW (ref 0.61–1.24)
Creatinine, Ser: 0.65 mg/dL (ref 0.61–1.24)
GFR, Estimated: >60 mL/min (ref >60)
GFR, Estimated: >60 mL/min (ref >60)
GFR, Estimated: >60 mL/min (ref >60)
Glucose, Bld: 181 mg/dL — ABNORMAL HIGH (ref 70–99)
Glucose, Bld: 179 mg/dL — ABNORMAL HIGH (ref 70–99)
Glucose, Bld: 234 mg/dL — ABNORMAL HIGH (ref 70–99)
Potassium: 6 mmol/L — ABNORMAL HIGH (ref 3.5–5.1)
Potassium: 5.6 mmol/L — ABNORMAL HIGH (ref 3.5–5.1)
Potassium: 5.1 mmol/L (ref 3.5–5.1)
Sodium: 125 mmol/L — ABNORMAL LOW (ref 135–145)
Sodium: 125 mmol/L — ABNORMAL LOW (ref 135–145)
Sodium: 126 mmol/L — ABNORMAL LOW (ref 135–145)

## 2021-01-15 LAB — CBC
HCT: 26.3 % — ABNORMAL LOW (ref 39.0–52.0)
Hemoglobin: 9 g/dL — ABNORMAL LOW (ref 13.0–17.0)
MCH: 30.1 pg (ref 26.0–34.0)
MCHC: 34.2 g/dL (ref 30.0–36.0)
MCV: 88 fL (ref 80.0–100.0)
Platelets: 219 10*3/uL (ref 150–400)
RBC: 2.99 MIL/uL — ABNORMAL LOW (ref 4.22–5.81)
RDW: 18.2 % — ABNORMAL HIGH (ref 11.5–15.5)
WBC: 4.1 10*3/uL (ref 4.0–10.5)
nRBC: 0 % (ref 0.0–0.2)

## 2021-01-15 LAB — GLUCOSE, CAPILLARY: Glucose-Capillary: 153 mg/dL — ABNORMAL HIGH (ref 70–99)

## 2021-01-15 LAB — MAGNESIUM: Magnesium: 1.8 mg/dL (ref 1.7–2.4)

## 2021-01-15 MED ORDER — SODIUM CHLORIDE 0.9 % IV BOLUS
500.0000 mL | Freq: Once | INTRAVENOUS | Status: AC
  Administered 2021-01-15: 500 mL via INTRAVENOUS

## 2021-01-15 NOTE — Progress Notes (Signed)
PT Cancellation Note  Patient Details Name: Donald Lowery MRN: [id number] DOB: Mar 30, 1951   Cancelled Treatment:    Reason Eval/Treat Not Completed: Medical issues which prohibited therapy: Pt's most recent K 5.6 falling outside guidelines for participation with PT services. Will attempt to see pt at a future date/time as medically appropriate.     Linus Salmons PT, DPT 01/15/21, 8:51 AM

## 2021-01-15 NOTE — Progress Notes (Signed)
PROGRESS NOTE    SUPREME RYBARCZYK  QPR:[id number] DOB: 03-Jan-1951 DOA: 01/12/2021 PCP: [name], NP  Outpatient Specialists: oncology, cardiology    Brief Narrative:   Donald Lowery is a 70 y.o. 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  No falls, no trauma.  He was seen yesterday by his oncologist and had been on prednisone taper pack which helped relieve his pain.  He denies any chest pain shortness of breath cough or hemoptysis.  He denies any abdominal pain.  No nausea or vomiting, constipation or diarrhea.  No urinary symptoms.  EDP requested admission for symptoms management and pain control.   Assessment & Plan:   Active Problems:   Intractable back pain   Intractable pain   Pressure injury of skin  # Intractable pain secondary to bony metastases # Metastatic lung cancer. Known mets to left iliac crest; current pain right iliac rest region. Lumbar spine x ray showing new L3 compression deformity. Unable to ambulate secondary to pain. On ms contin 15 bid at home with oxy 10 prn. Pain this morning not relieved from meds he has received thus far. Recently completed chemotherapy; has one radiation treatment left. Has been referred to orthopedics as outpt but hasn't been yet. Recently also saw pain mgmt.  - started fentanyl patch 25 mcg - increased dilaudid to 2 mg q2 prn - miralax/senna - narcan prn - onc palliative notified - re-started prednisone @ 40 mg daily - pt/ot consults pending - onc discussed case w/ rad onc, plan for rad onc 9/19 (Monday)  # Dysphagia SLP consulted. Noted left  vocal cord paralysis on recent PET scan, may contribute to dysphagia - dysphagia diet - consider outpt ENT f/u  # New L3 compression fracture Difficulty ambulating appears to be 2/2 pain but unable to clearly distinguish true weakness. MRI shows l3 pathologic fracture with extension of tumor into canal resulting in severe canal and foraminal stenosis. Discussed w/ neurosurgery, thinks kyphoplasty may be warranted - neurosurg to discuss w/ ortho (menz) regarding kyphoplast, tentative plan for this, ortho to eval this week - advising lso brace for now (ordered)  # Hyponatremia Chronic, most likely siadh from pulmonary mets. Is at baseline - monitor  # Hypokalemia Resolved  # Hyperkalemia 6 this morning, 5.6 on repeat. Likely iatrogenic as received kcl yesterday morning before k of 5.1 was seen on labs.  - 500 cc bolus - repeat pm bmp  # T2DM Here glucose wnl - home metformin - daily fasting sugar  # CAD # Hx NSTEMI Nstemi 2020, medical mgmt w/ diffuse disease on cath - cont home statin, zetia, imdur  # HTN Hx of, now off meds, bp here wnl - monitor  # Malnutrition, moderate - nutrition supplement   DVT prophylaxis: lovenox Code Status: full Family Communication: wife updated @ bedside 9/18  Level of care: Progressive Cardiac Status is: inpt  The patient will require care spanning > 2 midnights and should be moved to inpatient because: Inpatient level of care appropriate due to severity of illness  Dispo:  Patient From: Home  Planned Disposition: tbd  Medically stable for discharge: No  Consultants:  Oncology notified  Procedures: none  Antimicrobials:  none    Subjective: Says pain is severe and has not been relieved, mainly right iliac crest. Mild low back pain. Tolerating diet, no n/v/d.   Objective: Vitals:   01/14/21 2337 01/15/21 0251 01/15/21 0750 01/15/21 1202  BP: (!) 106/55 116/76 118/73 133/72  Pulse: 96 (!) 102 83 94  Resp: 15 17  18 18   Temp: 97.8 F (36.6 C) 98.2 F (36.8 C) 98.1 F (36.7 C) 98 F (36.7 C)  TempSrc:    Oral  SpO2: 97% 98% 92% 96%  Weight:      Height:        Intake/Output Summary (Last 24 hours) at 01/15/2021 1324 Last data filed at 01/15/2021 1204 Gross per 24 hour  Intake 240 ml  Output 600 ml  Net -360 ml   Filed Weights   01/12/21 1519 01/13/21 2345  Weight: 53.3 kg 53 kg    Examination:  General exam: Appears calm and comfortable  Respiratory system: Clear to auscultation. Respiratory effort normal. Cardiovascular system: S1 & S2 heard, RRR. No JVD, murmurs, rubs, gallops or clicks. No pedal edema. Gastrointestinal system: Abdomen is nondistended, soft and nontender. No organomegaly or masses felt. Normal bowel sounds heard. Central nervous system: Alert and oriented. No focal neurological deficits. Extremities: Symmetric 5 x 5 power. Skin: No rashes, lesions or ulcers Psychiatry: Judgement and insight appear normal. Mood & affect appropriate.     Data Reviewed: I have personally reviewed following labs and imaging studies  CBC: Recent Labs  Lab 01/11/21 0835 01/12/21 1816 01/15/21 0554  WBC 5.2 4.2 4.1  NEUTROABS 4.3 3.6  --   HGB 8.7* 8.9* 9.0*  HCT 26.0* 25.9* 26.3*  MCV 87.0 86.9 88.0  PLT 280 250 761   Basic Metabolic Panel: Recent Labs  Lab 01/12/21 1816 01/13/21 1452 01/14/21 0551 01/15/21 0554 01/15/21 0654  NA 129* 133* 129* 125* 125*  K 3.2* 4.8 5.1 6.0* 5.6*  CL 90* 96* 94* 89* 91*  CO2 32 28 27 31 30   GLUCOSE 132* 93 113* 181* 179*  BUN 17 15 18 17 17   CREATININE 0.63 0.53* 0.58* 0.55* 0.50*  CALCIUM 8.2* 7.9* 8.1* 8.9 8.6*  MG  --  1.8 1.8 1.8  --    GFR: Estimated Creatinine Clearance: 63.5 mL/min (A) (by C-G formula based on SCr of 0.5 mg/dL (L)). Liver Function Tests: Recent Labs  Lab 01/11/21 0835 01/12/21 1816  AST 16 19  ALT 19 20  ALKPHOS 158* 134*  BILITOT 1.0 0.6  PROT 6.4* 5.9*  ALBUMIN 3.0* 2.6*   No results for  input(s): LIPASE, AMYLASE in the last 168 hours. No results for input(s): AMMONIA in the last 168 hours. Coagulation Profile: No results for input(s): INR, PROTIME in the last 168 hours. Cardiac Enzymes: No results for input(s): CKTOTAL, CKMB, CKMBINDEX, TROPONINI in the last 168 hours. BNP (last 3 results) No results for input(s): PROBNP in the last 8760 hours. HbA1C: Recent Labs    01/12/21 1816  HGBA1C 6.1*   CBG: Recent Labs  Lab 01/12/21 2340 01/14/21 0844 01/14/21 1138 01/14/21 2041 01/15/21 0612  GLUCAP 101* 119* 136* 171* 153*   Lipid Profile: No results for input(s): CHOL, HDL, LDLCALC, TRIG, CHOLHDL, LDLDIRECT in the last 72 hours. Thyroid Function Tests: No results for input(s): TSH, T4TOTAL, FREET4, T3FREE, THYROIDAB in the last 72 hours. Anemia Panel: No results for input(s): VITAMINB12, FOLATE, FERRITIN, TIBC, IRON, RETICCTPCT in the last 72  hours. Urine analysis: No results found for: COLORURINE, APPEARANCEUR, LABSPEC, PHURINE, GLUCOSEU, HGBUR, BILIRUBINUR, KETONESUR, PROTEINUR, UROBILINOGEN, NITRITE, LEUKOCYTESUR Sepsis Labs: @LABRCNTIP (procalcitonin:4,lacticidven:4)  ) Recent Results (from the past 240 hour(s))  Resp Panel by RT-PCR (Flu A&B, Covid) Nasopharyngeal Swab     Status: None   Collection Time: 01/12/21  6:16 PM   Specimen: Nasopharyngeal Swab; Nasopharyngeal(NP) swabs in vial transport medium  Result Value Ref Range Status   SARS Coronavirus 2 by RT PCR NEGATIVE NEGATIVE Final    Comment: (NOTE) SARS-CoV-2 target nucleic acids are NOT DETECTED.  The SARS-CoV-2 RNA is generally detectable in upper respiratory specimens during the acute phase of infection. The lowest concentration of SARS-CoV-2 viral copies this assay can detect is 138 copies/mL. A negative result does not preclude SARS-Cov-2 infection and should not be used as the sole basis for treatment or other patient management decisions. A negative result may occur with  improper  specimen collection/handling, submission of specimen other than nasopharyngeal swab, presence of viral mutation(s) within the areas targeted by this assay, and inadequate number of viral copies(<138 copies/mL). A negative result must be combined with clinical observations, patient history, and epidemiological information. The expected result is Negative.  Fact Sheet for Patients:  EntrepreneurPulse.com.au  Fact Sheet for Healthcare Providers:  IncredibleEmployment.be  This test is no t yet approved or cleared by the [place] FDA and  has been authorized for detection and/or diagnosis of SARS-CoV-2 by FDA under an Emergency Use Authorization (EUA). This EUA will remain  in effect (meaning this test can be used) for the duration of the COVID-19 declaration under Section 564(b)(1) of the Act, 21 U.S.C.section 360bbb-3(b)(1), unless the authorization is terminated  or revoked sooner.       Influenza A by PCR NEGATIVE NEGATIVE Final   Influenza B by PCR NEGATIVE NEGATIVE Final    Comment: (NOTE) The Xpert Xpress SARS-CoV-2/FLU/RSV plus assay is intended as an aid in the diagnosis of influenza from Nasopharyngeal swab specimens and should not be used as a sole basis for treatment. Nasal washings and aspirates are unacceptable for Xpert Xpress SARS-CoV-2/FLU/RSV testing.  Fact Sheet for Patients: EntrepreneurPulse.com.au  Fact Sheet for Healthcare Providers: IncredibleEmployment.be  This test is not yet approved or cleared by the [place] FDA and has been authorized for detection and/or diagnosis of SARS-CoV-2 by FDA under an Emergency Use Authorization (EUA). This EUA will remain in effect (meaning this test can be used) for the duration of the COVID-19 declaration under Section 564(b)(1) of the Act, 21 U.S.C. section 360bbb-3(b)(1), unless the authorization is terminated or revoked.  Performed at  [facility], [address] [place], [place] [address]          Radiology Studies: No results found.      Scheduled Meds:  vitamin C  500 mg Oral BID   aspirin EC  81 mg Oral Daily   atorvastatin  80 mg Oral q1800   enoxaparin (LOVENOX) injection  40 mg Subcutaneous Q24H   ezetimibe  10 mg Oral Daily   feeding supplement  237 mL Oral TID BM   fentaNYL  1 patch Transdermal Q72H   isosorbide mononitrate  30 mg Oral Daily   lidocaine  1 patch Transdermal Q12H   metFORMIN  500 mg Oral Q breakfast   multivitamin with minerals  1 tablet Oral Daily   polyethylene glycol  17 g Oral Daily   predniSONE  40 mg Oral Q breakfast   senna  2 tablet Oral Daily  vitamin B-12  1,000 mcg Oral Daily   Continuous Infusions:  sodium chloride        LOS: 2 days    Time spent: 20 min    Desma Maxim, MD [facility]   If 7PM-7AM, please contact night-coverage [email or web address] Password [email or web address] 01/15/2021, 1:24 PM

## 2021-01-15 NOTE — Progress Notes (Signed)
LSO brace obtained for patient who reports comfortable fit. Attempted standing x1 which was successful, but subsequently c/o dizziness that did not resolve, refusing further attempts at ambulation, assisted back to bed. Dr. Si Raider secure chat messaged with same.

## 2021-01-15 NOTE — TOC Initial Note (Signed)
Transition of Care [facility]) - Initial/Assessment Note    Patient Details  Name: Donald Lowery MRN: [id number] Date of Birth: 10-02-1950  Transition of Care [facility]) CM/SW Contact:    Candie Chroman, LCSW Phone Number: 01/15/2021, 10:54 AM  Clinical Narrative:   Readmission prevention screen complete. CSW met with patient. Son at bedside. Wife went home to rest but will be back later. CSW introduced role and explained that discharge planning would be discussed. PCP is Keith Rake, NP at [facility]. Wife drives him to appointments. Pharmacy is [facility] in [place]. No issues obtaining medications. No home health prior to admission. OT recommending home health. PT eval pending. Patient unsure if he will want home health or SNF if recommended. Son asked CSW to discuss with wife when recommendations made. Wife reportedly unable to provide much physical assistance. Patient has a walker at home. Made them aware of recommendation for 3-in-1. No further concerns. CSW encouraged patient and his son to contact CSW as needed. CSW will continue to follow patient and his family for support and facilitate discharge when stable.               Expected Discharge Plan: [place] Barriers to Discharge: Continued Medical Work up   Patient Goals and CMS Choice        Expected Discharge Plan and Services Expected Discharge Plan: [place] Acute Care Choice:  (TBD) Living arrangements for the past 2 months: Single Family Home                                      Prior Living Arrangements/Services Living arrangements for the past 2 months: Single Family Home Lives with:: Spouse Patient language and need for interpreter reviewed:: Yes Do you feel safe going back to the place where you live?: Yes      Need for Family Participation in Patient Care: Yes (Comment) Care giver support system in place?: Yes (comment) Current home services:  DME Criminal Activity/Legal Involvement Pertinent to Current Situation/Hospitalization: No - Comment as needed  Activities of Daily Living Home Assistive Devices/Equipment: None ADL Screening (condition at time of admission) Patient's cognitive ability adequate to safely complete daily activities?: No Is the patient deaf or have difficulty hearing?: No Does the patient have difficulty seeing, even when wearing glasses/contacts?: No Does the patient have difficulty concentrating, remembering, or making decisions?: Yes Patient able to express need for assistance with ADLs?: No Does the patient have difficulty dressing or bathing?: Yes Independently performs ADLs?: No Communication: Needs assistance Is this a change from baseline?: Pre-admission baseline Dressing (OT): Needs assistance Is this a change from baseline?: Pre-admission baseline Grooming: Needs assistance Is this a change from baseline?: Pre-admission baseline Feeding: Independent Bathing: Needs assistance Is this a change from baseline?: Pre-admission baseline Toileting: Needs assistance Is this a change from baseline?: Pre-admission baseline In/Out Bed: Needs assistance Is this a change from baseline?: Pre-admission baseline Walks in Home: Needs assistance Is this a change from baseline?: Pre-admission baseline Does the patient have difficulty walking or climbing stairs?: Yes Weakness of Legs: Both Weakness of Arms/Hands: Both  Permission Sought/Granted Permission sought to share information with : Family Supports Permission granted to share information with : Yes, Verbal Permission Granted  Share Information with NAME: Mikey Bussing     Permission granted to share info w Relationship: Son  Permission granted to share info w Contact Information: [phone]  Emotional Assessment Appearance:: Appears stated age Attitude/Demeanor/Rapport: Engaged Affect (typically observed): Calm Orientation: : Oriented to Self,  Oriented to Place, Oriented to  Time, Oriented to Situation Alcohol / Substance Use: Not Applicable Psych Involvement: No (comment)  Admission diagnosis:  Intractable pain [R52] Intractable back pain [M54.9] Intractable low back pain [M54.59] Bilateral low back pain with bilateral sciatica, unspecified chronicity [M54.42, M54.41] Patient Active Problem List   Diagnosis Date Noted   Pressure injury of skin 01/14/2021   Intractable pain 01/13/2021   Intractable back pain 01/12/2021   Radicular pain of shoulder (Left) 11/29/2020   Shoulder blade pain (Left) 11/29/2020   Cervicalgia (Left) 11/29/2020   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   Hypercalcemia of malignancy 11/10/2020   Hypertension 10/05/2020   Hyperlipidemia 10/05/2020   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[place]) 09/21/2020   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   Cancer related pain 09/21/2020   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/21/2020   Type 2 diabetes mellitus without complication, without long-term current use of insulin ([place]) 05/31/2020   Aortic atherosclerosis ([place]) 02/10/2019   CAD (coronary artery disease) 05/16/2018   NSTEMI (non-ST elevated myocardial infarction) ([place]) 05/15/2018   Emphysema of lung ([place]) 05/15/2018   PCP:  [name], NP Pharmacy:   [facility] [address], [place] [address] Phone: [phone] Fax: [phone]     Social Determinants of Health (SDOH) Interventions    Readmission Risk Interventions Readmission Risk Prevention Plan 01/15/2021  Transportation Screening Complete  PCP or Specialist Appt within 3-5 Days Complete  Social Work Consult for [place] Planning/Counseling Complete  Palliative Care Screening Not Applicable  Medication Review Press photographer) Complete  Some recent data might be hidden

## 2021-01-15 NOTE — Progress Notes (Signed)
Orthopedic Tech Progress Note Patient Details:  Donald Lowery January 29, 1951 [id number]         [name] 01/15/2021, 4:28 PMCalled and routed Lg. LSO brace order to [facility].

## 2021-01-15 NOTE — Consult Note (Signed)
Referring Physician:  No referring provider defined for this encounter.  Primary Physician:  [name], NP  Chief Complaint:  back pain  History of Present Illness: 01/15/2021 Donald Lowery is a 70 y.o. male who presents with the chief complaint of severe back pain with known history of metastatic lung cancer.  He has new L3 lesion identified with extension into the epidural space.  He is able to bear weight and walk but only with significant pain.  He is planned to start XRT tomorrow.  He has no neurologic complaints - full strength and no issues with sensory changes.  He has control of his bowel and bladder.   Review of Systems:  A 10 point review of systems is negative, except for the pertinent positives and negatives detailed in the HPI.  Past Medical History: Past Medical History:  Diagnosis Date   Aortic atherosclerosis ([place])    [place] hernia 09/21/2020   fatty   Coronary artery disease    a. 04/2018 NSTEMI/Cath: LM nl, LAD 50p, 15m/d diffuse-small caliber, LCX heavily Ca2+ sev prox/mid dzs, RCA 90 diff distal dzs into RPL and RPDA. EF 50-55%-->Med Rx.   Hepatic steatosis    History of 2019 novel coronavirus disease (COVID-19) 10/12/2020   History of echocardiogram    a. 04/2018 Echo: EF 55-60%, no rwma, mild MR, mildly dil LA. Nl RV fxn.   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 Quit 04/2018.    Past Surgical History: Past Surgical History:  Procedure Laterality Date   BRAIN SURGERY     CARDIAC CATHETERIZATION     IR IMAGING GUIDED PORT INSERTION  10/28/2020   LEFT HEART CATH AND CORONARY ANGIOGRAPHY N/A 05/16/2018   Procedure: LEFT HEART CATH AND CORONARY ANGIOGRAPHY poss PCI;   Surgeon: [name], MD;  Location: [facility];  Service: Cardiovascular;  Laterality: N/A;   VIDEO BRONCHOSCOPY WITH ENDOBRONCHIAL NAVIGATION N/A 10/24/2020   Procedure: ROBOTIC ASSISTED VIDEO BRONCHOSCOPY WITH ENDOBRONCHIAL NAVIGATION;  Surgeon: Tyler Pita, MD;  Location: [facility];  Service: Pulmonary;  Laterality: N/A;    Allergies: Allergies as of 01/12/2021   (No Known Allergies)    Medications:  Current Facility-Administered Medications:    acetaminophen (TYLENOL) tablet 650 mg, 650 mg, Oral, Q6H PRN, Hall, Carole N, DO   ascorbic acid (VITAMIN C) tablet 500 mg, 500 mg, Oral, BID, Wouk, Ailene Rud, MD, 500 mg at 01/14/21 2245   aspirin EC tablet 81 mg, 81 mg, Oral, Daily, Kayleen Memos, DO, 81 mg at 01/14/21 0829   atorvastatin (LIPITOR) tablet 80 mg, 80 mg, Oral, q1800, Kayleen Memos, DO, 80 mg at 01/14/21 1735   cyclobenzaprine (FLEXERIL) tablet 5 mg, 5 mg, Oral, TID PRN, Irene Pap N, DO, 5 mg at 01/13/21 0837   enoxaparin (LOVENOX) injection 40 mg, 40 mg, Subcutaneous, Q24H, Hall, Carole N, DO, 40 mg at 01/14/21 2035   ezetimibe (ZETIA) tablet 10 mg, 10 mg, Oral, Daily, Hall, Carole N, DO, 10 mg at 01/14/21 3300   feeding supplement (ENSURE ENLIVE / ENSURE PLUS) liquid 237 mL, 237 mL, Oral, TID BM, Wouk, Ailene Rud, MD   fentaNYL (DURAGESIC) 25 MCG/HR 1 patch,  1 patch, Transdermal, Q72H, Wouk, Ailene Rud, MD, 1 patch at 01/13/21 0851   HYDROmorphone (DILAUDID) injection 2 mg, 2 mg, Intravenous, Q2H PRN, Wouk, Ailene Rud, MD, 2 mg at 01/15/21 3267   isosorbide mononitrate (IMDUR) 24 hr tablet 30 mg, 30 mg, Oral, Daily, Wouk, Ailene Rud, MD, 30 mg at 01/14/21 0827   lidocaine (LIDODERM) 5 % 1 patch, 1 patch, Transdermal, Q12H, Hall, Carole N, DO, 1 patch at 01/14/21 2245   melatonin tablet 3 mg, 3 mg, Oral, QHS PRN, [place] Crane, Carole N, DO   metFORMIN (GLUCOPHAGE) tablet 500 mg, 500 mg, Oral, Q breakfast, Wouk, Ailene Rud, MD, 500 mg at 01/14/21  1245   multivitamin with minerals tablet 1 tablet, 1 tablet, Oral, Daily, Wouk, Ailene Rud, MD   naloxone [facility]) injection 0.4 mg, 0.4 mg, Intravenous, PRN, [place] Crane, Carole N, DO   ondansetron [facility]) injection 4 mg, 4 mg, Intravenous, Q6H PRN, Hall, Carole N, DO, 4 mg at 01/13/21 2016   polyethylene glycol (MIRALAX / GLYCOLAX) packet 17 g, 17 g, Oral, Daily, Wouk, Ailene Rud, MD, 17 g at 01/14/21 8099   predniSONE (DELTASONE) tablet 40 mg, 40 mg, Oral, Q breakfast, Wouk, Ailene Rud, MD, 40 mg at 01/14/21 8338   senna (SENOKOT) tablet 17.2 mg, 2 tablet, Oral, Daily, Wouk, Ailene Rud, MD, 17.2 mg at 01/14/21 0827   vitamin B-12 (CYANOCOBALAMIN) tablet 1,000 mcg, 1,000 mcg, Oral, Daily, Irene Pap N, DO, 1,000 mcg at 01/14/21 2505   Social History: Social History   Tobacco Use   Smoking status: Former    Packs/day: 1.00    Years: 45.00    Pack years: 45.00    Types: Cigarettes    Quit date: 05/15/2018    Years since quitting: 2.6   Smokeless tobacco: Never  Vaping Use   Vaping Use: Never used  Substance Use Topics   Alcohol use: Never   Drug use: Never    Family Medical History: Family History  Problem Relation Age of Onset   Heart Problems Mother    Heart attack Father    Diabetes Brother    Heart Problems Brother     Physical Examination: Vitals:   01/15/21 0251 01/15/21 0750  BP: 116/76 118/73  Pulse: (!) 102 83  Resp: 17 18  Temp: 98.2 F (36.8 C) 98.1 F (36.7 C)  SpO2: 98% 92%     General: Patient is thin and in no apparent distress.  Psychiatric: Patient is non-anxious.  Head:  Pupils equal, round, and reactive to light. He is very hard of hearing  ENT:  Oral mucosa appears well hydrated.  Neck:   Supple.  Full range of motion.  Respiratory: Patient is breathing without any difficulty.  Extremities: No edema.  Vascular: Palpable pulses in dorsal pedal vessels.  Skin:   On exposed skin, there are no abnormal skin  lesions.  NEUROLOGICAL:  General: In no acute distress.   Awake, alert, oriented to person, place, and time.  Pupils equal round and reactive to light.  Facial tone is symmetric.  Tongue protrusion is midline.  There is no pronator drift.  Strength: Side Biceps Triceps Deltoid Interossei Grip Wrist Ext. Wrist Flex.  R 5 5 5 5 5 5 5   L 5 5 5 5 5 5 5    Side Iliopsoas Quads Hamstring PF DF EHL  R 5 5 5 5 5 5   L 5 5 5 5 5 5     Bilateral upper and lower extremity sensation is intact to  light touch. Reflexes are 1+ and symmetric at the biceps, triceps, brachioradialis, patella and achilles. Hoffman's is absent.  Clonus is not present.  Toes are down-going.    Gait is untested.   Imaging: MRI L spine 01/13/21 IMPRESSION: 1. Osseous metastatic disease at L3 with pathologic fracture (50% height loss) and posterior epidural extension of tumor into the canal and right foramen. Resulting severe canal stenosis and moderate to severe right foraminal stenosis. Associated right anterolateral 4.2 cm paraspinal soft tissue mass at L3 with surrounding inflammation/edema. 2. No evidence of osseous metastatic disease in the other lumbar vertebral bodies. Please see concurrent MRI of the pelvis for evaluation of the pelvis/sacrum.     Electronically Signed   By: Margaretha Sheffield M.D.   On: 01/13/2021 12:13  I have personally reviewed the images and agree with the above interpretation.  Labs: CBC Latest Ref Rng & Units 01/15/2021 01/12/2021 01/11/2021  WBC 4.0 - 10.5 K/uL 4.1 4.2 5.2  Hemoglobin 13.0 - 17.0 g/dL 9.0(L) 8.9(L) 8.7(L)  Hematocrit 39.0 - 52.0 % 26.3(L) 25.9(L) 26.0(L)  Platelets 150 - 400 K/uL 219 250 280       Assessment and Plan: Donald Lowery is a pleasant 70 y.o. male with metastatic disease to L3 with a pathologic fracture.  He is very ill from his cancer and has progression of disease despite treatment.  At this point I would not recommend surgical fixation. I agree  with plan for radiation treatment by Dr. Baruch Gouty starting tomorrow.  I have discussed this case with Dr. Rudene Christians and have recommended L3 kyphoplasty and LSO bracing to stabilize the area.  If this does not help in concert with radiation treatment, we could consider surgical fixation. However, I am concerned that his current medical burden may not allow a great recovery from surgery.  - LSO - Dr. Rudene Christians to evaluate for L3 kyphoplasty - Agree with radiation treatment with Dr. Colette Ribas K. Izora Ribas MD, [place] [facility] of [facility]

## 2021-01-16 ENCOUNTER — Ambulatory Visit: Payer: Medicare HMO

## 2021-01-16 LAB — CBC
HCT: 24 % — ABNORMAL LOW (ref 39.0–52.0)
Hemoglobin: 8.3 g/dL — ABNORMAL LOW (ref 13.0–17.0)
MCH: 30.4 pg (ref 26.0–34.0)
MCHC: 34.6 g/dL (ref 30.0–36.0)
MCV: 87.9 fL (ref 80.0–100.0)
Platelets: 223 10*3/uL (ref 150–400)
RBC: 2.73 MIL/uL — ABNORMAL LOW (ref 4.22–5.81)
RDW: 17.9 % — ABNORMAL HIGH (ref 11.5–15.5)
WBC: 4.5 10*3/uL (ref 4.0–10.5)
nRBC: 0 % (ref 0.0–0.2)

## 2021-01-16 LAB — BASIC METABOLIC PANEL
Anion gap: 5 (ref 5–15)
BUN: 15 mg/dL (ref 8–23)
CO2: 29 mmol/L (ref 22–32)
Calcium: 8.7 mg/dL — ABNORMAL LOW (ref 8.9–10.3)
Chloride: 93 mmol/L — ABNORMAL LOW (ref 98–111)
Creatinine, Ser: 0.62 mg/dL (ref 0.61–1.24)
GFR, Estimated: >60 mL/min (ref >60)
Glucose, Bld: 182 mg/dL — ABNORMAL HIGH (ref 70–99)
Potassium: 4.6 mmol/L (ref 3.5–5.1)
Sodium: 127 mmol/L — ABNORMAL LOW (ref 135–145)

## 2021-01-16 LAB — GLUCOSE, CAPILLARY: Glucose-Capillary: 165 mg/dL — ABNORMAL HIGH (ref 70–99)

## 2021-01-16 LAB — MAGNESIUM: Magnesium: 1.8 mg/dL (ref 1.7–2.4)

## 2021-01-16 MED ORDER — CEFAZOLIN SODIUM-DEXTROSE 1-4 GM/50ML-% IV SOLN
1.0000 g | Freq: Once | INTRAVENOUS | Status: AC
  Administered 2021-01-17: 1 g via INTRAVENOUS

## 2021-01-16 MED ORDER — LIDOCAINE VISCOUS HCL 2 % MT SOLN
15.0000 mL | Freq: Four times a day (QID) | OROMUCOSAL | Status: DC | PRN
  Administered 2021-01-16: 15 mL via OROMUCOSAL
  Filled 2021-01-16 (×3): qty 15

## 2021-01-16 NOTE — Care Management Important Message (Signed)
Important Message  Patient Details  Name: [name] MRN: [id number] Date of Birth: 10-02-1950   Medicare Important Message Given:  Yes     Dannette Barbara 01/16/2021, 4:49 PM

## 2021-01-16 NOTE — Progress Notes (Signed)
Patient seen and examined full consult to follow. Dr. Cari Caraway discussed the case with me over the weekend and I have reviewed radiographs. I think radiofrequency ablation with kyphoplasty would be of benefit and we will tentatively schedule this for tomorrow, that along with radiation therapy hopefully will allow for adequate control of pain and stability to the spine to get him mobilized.

## 2021-01-16 NOTE — Progress Notes (Signed)
Chart reviewed. Pt visited just after lunch earlier today. He ate some of the soft items and reported no dysphagia but reported he just wasn't hungry. Plans for kyphoplasty and radiation therapy for pain control. Will continue to follow for toleration of diet in light of left vocal cord paralysis. Pt is at risk for aspiration. MBSS only if deemed necessary and if Pt is able to tolerate. Rec continue with current Dys 3 diet for now

## 2021-01-16 NOTE — Progress Notes (Signed)
PT Cancellation Note  Patient Details Name: Donald Lowery MRN: [id number] DOB: May 13, 1950   Cancelled Treatment:    Reason Eval/Treat Not Completed: Patient at procedure or test/unavailable.  Pt unavailable at this time, off floor for test.  Will re-attempt as medically appropriate at later date/time.   Gwenlyn Saran, PT, DPT 01/16/21, 2:41 PM

## 2021-01-16 NOTE — Progress Notes (Signed)
PROGRESS NOTE    Donald Lowery  UVO:[id number] DOB: 1951/01/01 DOA: 01/12/2021 PCP: [name], NP  Outpatient Specialists: oncology, cardiology    Brief Narrative:   Donald Lowery is a 70 y.o. 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  No falls, no trauma.  He was seen yesterday by his oncologist and had been on prednisone taper pack which helped relieve his pain.  He denies any chest pain shortness of breath cough or hemoptysis.  He denies any abdominal pain.  No nausea or vomiting, constipation or diarrhea.  No urinary symptoms.  EDP requested admission for symptoms management and pain control.   Assessment & Plan:   Active Problems:   Intractable back pain   Intractable pain   Pressure injury of skin  # Intractable pain secondary to bony metastases # Metastatic lung cancer. Known mets to left iliac crest; current pain right iliac rest region. Lumbar spine x ray showing new L3 compression deformity. Unable to ambulate secondary to pain. On ms contin 15 bid at home with oxy 10 prn. Pain somewhat improved. Recently completed chemotherapy; has one radiation treatment left. Has been referred to orthopedics as outpt but hasn't been yet. Recently also saw pain mgmt.  - started fentanyl patch 25 mcg - increased dilaudid to 2 mg q2 prn - miralax/senna - narcan prn - onc palliative notified - re-started prednisone @ 40 mg daily - pt/ot consults pending - onc discussed case w/ rad onc, plan for radio therapy today or tomorrow. Ortho also tentatively planning kyphoplasty for tomorrow - in lso brace -  declines snf but is ok with home health pt  # Dysphagia SLP consulted. 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 MRI shows l3 pathologic fracture with extension of tumor into canal resulting in severe canal and foraminal stenosis. Discussed w/ neurosurgery, thinks kyphoplasty may be warranted - tentative plan for kyphoplasty tomorrow - lso brace  # Hyponatremia Chronic, most likely siadh from pulmonary mets. Is at baseline and stable - monitor  # Hypokalemia Resolved  # Hyperkalemia Iatrogenic, resolved  # T2DM Here glucose wnl - home metformin - daily fasting sugar  # CAD # Hx NSTEMI Nstemi 2020, medical mgmt w/ diffuse disease on cath - cont home statin, zetia, imdur  # HTN Hx of, now off meds, bp here wnl - monitor  # Malnutrition, moderate - nutrition supplement   DVT prophylaxis: lovenox Code Status: full Family Communication: wife updated @ bedside 9/19  Level of care: Progressive Cardiac Status is: inpt  The patient will require care spanning > 2 midnights and should be moved to inpatient because: Inpatient level of care appropriate due to severity of illness  Dispo:  Patient From: Home  Planned Disposition: home w/ home health  Medically stable for discharge: No       Consultants:  Oncology notified  Procedures: none  Antimicrobials:  none    Subjective: Pain continues, improved from presentation.   Objective: Vitals:  01/15/21 2051 01/16/21 0022 01/16/21 0435 01/16/21 0733  BP: 130/78 (!) 108/56 (!) 149/74 (!) 111/45  Pulse: 94 (!) 105 (!) 103 100  Resp: 17 14 13 17   Temp: 98 F (36.7 C) 97.8 F (36.6 C) 98.5 F (36.9 C) 98.7 F (37.1 C)  TempSrc: Oral  Oral   SpO2: 99% 99% 99% 95%  Weight:      Height:         Intake/Output Summary (Last 24 hours) at 01/16/2021 1039 Last data filed at 01/16/2021 0738 Gross per 24 hour  Intake 737.51 ml  Output 1625 ml  Net -887.49 ml   Filed Weights   01/12/21 1519 01/13/21 2345  Weight: 53.3 kg 53 kg    Examination:  General exam: Appears calm and comfortable, in pain when moves.  Respiratory system: Clear to auscultation. Respiratory effort normal. Cardiovascular system: S1 & S2 heard, RRR. No JVD, murmurs, rubs, gallops or clicks. No pedal edema. Gastrointestinal system: Abdomen is nondistended, soft and nontender. No organomegaly or masses felt. Normal bowel sounds heard. Central nervous system: Alert and oriented. No focal neurological deficits. Extremities: Symmetric 5 x 5 power. Skin: No rashes, lesions or ulcers Psychiatry: Judgement and insight appear normal. Mood & affect appropriate.     Data Reviewed: I have personally reviewed following labs and imaging studies  CBC: Recent Labs  Lab 01/11/21 0835 01/12/21 1816 01/15/21 0554 01/16/21 0617  WBC 5.2 4.2 4.1 4.5  NEUTROABS 4.3 3.6  --   --   HGB 8.7* 8.9* 9.0* 8.3*  HCT 26.0* 25.9* 26.3* 24.0*  MCV 87.0 86.9 88.0 87.9  PLT 280 250 219 628   Basic Metabolic Panel: Recent Labs  Lab 01/13/21 1452 01/14/21 0551 01/15/21 0554 01/15/21 0654 01/15/21 1711 01/16/21 0617  NA 133* 129* 125* 125* 126* 127*  K 4.8 5.1 6.0* 5.6* 5.1 4.6  CL 96* 94* 89* 91* 90* 93*  CO2 28 27 31 30 30 29   GLUCOSE 93 113* 181* 179* 234* 182*  BUN 15 18 17 17 17 15   CREATININE 0.53* 0.58* 0.55* 0.50* 0.65 0.62  CALCIUM 7.9* 8.1* 8.9 8.6* 8.6* 8.7*  MG 1.8 1.8 1.8  --   --  1.8   GFR: Estimated Creatinine Clearance: 63.5 mL/min (by C-G formula based on SCr of 0.62 mg/dL). Liver Function Tests: Recent Labs  Lab 01/11/21 0835 01/12/21 1816  AST 16 19  ALT 19 20  ALKPHOS 158* 134*  BILITOT 1.0 0.6  PROT 6.4* 5.9*  ALBUMIN 3.0* 2.6*   No results for input(s): LIPASE, AMYLASE in the last  168 hours. No results for input(s): AMMONIA in the last 168 hours. Coagulation Profile: No results for input(s): INR, PROTIME in the last 168 hours. Cardiac Enzymes: No results for input(s): CKTOTAL, CKMB, CKMBINDEX, TROPONINI in the last 168 hours. BNP (last 3 results) No results for input(s): PROBNP in the last 8760 hours. HbA1C: No results for input(s): HGBA1C in the last 72 hours.  CBG: Recent Labs  Lab 01/14/21 0844 01/14/21 1138 01/14/21 2041 01/15/21 0612 01/16/21 0612  GLUCAP 119* 136* 171* 153* 165*   Lipid Profile: No results for input(s): CHOL, HDL, LDLCALC, TRIG, CHOLHDL, LDLDIRECT in the last 72 hours. Thyroid Function Tests: No results for input(s): TSH, T4TOTAL, FREET4, T3FREE, THYROIDAB in the last 72 hours. Anemia Panel: No results for input(s): VITAMINB12, FOLATE, FERRITIN, TIBC, IRON, RETICCTPCT in the last 72 hours. Urine analysis: No results found for: COLORURINE, APPEARANCEUR, [place], [place], [place], [place], [place], [place], [place], [place],  NITRITE, LEUKOCYTESUR Sepsis Labs: @LABRCNTIP (procalcitonin:4,lacticidven:4)  ) Recent Results (from the past 240 hour(s))  Resp Panel by RT-PCR (Flu A&B, Covid) Nasopharyngeal Swab     Status: None   Collection Time: 01/12/21  6:16 PM   Specimen: Nasopharyngeal Swab; Nasopharyngeal(NP) swabs in vial transport medium  Result Value Ref Range Status   SARS Coronavirus 2 by RT PCR NEGATIVE NEGATIVE Final    Comment: (NOTE) SARS-CoV-2 target nucleic acids are NOT DETECTED.  The SARS-CoV-2 RNA is generally detectable in upper respiratory specimens during the acute phase of infection. The lowest concentration of SARS-CoV-2 viral copies this assay can detect is 138 copies/mL. A negative result does not preclude SARS-Cov-2 infection and should not be used as the sole basis for treatment or other patient management decisions. A negative result may occur with  improper specimen collection/handling,  submission of specimen other than nasopharyngeal swab, presence of viral mutation(s) within the areas targeted by this assay, and inadequate number of viral copies(<138 copies/mL). A negative result must be combined with clinical observations, patient history, and epidemiological information. The expected result is Negative.  Fact Sheet for Patients:  EntrepreneurPulse.com.au  Fact Sheet for Healthcare Providers:  IncredibleEmployment.be  This test is no t yet approved or cleared by the [place] FDA and  has been authorized for detection and/or diagnosis of SARS-CoV-2 by FDA under an Emergency Use Authorization (EUA). This EUA will remain  in effect (meaning this test can be used) for the duration of the COVID-19 declaration under Section 564(b)(1) of the Act, 21 U.S.C.section 360bbb-3(b)(1), unless the authorization is terminated  or revoked sooner.       Influenza A by PCR NEGATIVE NEGATIVE Final   Influenza B by PCR NEGATIVE NEGATIVE Final    Comment: (NOTE) The Xpert Xpress SARS-CoV-2/FLU/RSV plus assay is intended as an aid in the diagnosis of influenza from Nasopharyngeal swab specimens and should not be used as a sole basis for treatment. Nasal washings and aspirates are unacceptable for Xpert Xpress SARS-CoV-2/FLU/RSV testing.  Fact Sheet for Patients: EntrepreneurPulse.com.au  Fact Sheet for Healthcare Providers: IncredibleEmployment.be  This test is not yet approved or cleared by the [place] FDA and has been authorized for detection and/or diagnosis of SARS-CoV-2 by FDA under an Emergency Use Authorization (EUA). This EUA will remain in effect (meaning this test can be used) for the duration of the COVID-19 declaration under Section 564(b)(1) of the Act, 21 U.S.C. section 360bbb-3(b)(1), unless the authorization is terminated or revoked.  Performed at [facility], [address] [place], [place] [address]          Radiology Studies: No results found.      Scheduled Meds:  vitamin C  500 mg Oral BID   aspirin EC  81 mg Oral Daily   atorvastatin  80 mg Oral q1800   ezetimibe  10 mg Oral Daily   feeding supplement  237 mL Oral TID BM   fentaNYL  1 patch Transdermal Q72H   isosorbide mononitrate  30 mg Oral Daily   lidocaine  1 patch Transdermal Q12H   metFORMIN  500 mg Oral Q breakfast   multivitamin with minerals  1 tablet Oral Daily   polyethylene glycol  17 g Oral Daily   predniSONE  40 mg Oral Q breakfast   senna  2 tablet Oral Daily   vitamin B-12  1,000 mcg Oral Daily   Continuous Infusions:      LOS: 3 days    Time spent: 20  min    Desma Maxim, MD [facility]   If 7PM-7AM, please contact night-coverage [email or web address] Password [facility] 01/16/2021, 10:39 AM

## 2021-01-17 ENCOUNTER — Inpatient Hospital Stay: Payer: Medicare HMO | Admitting: Certified Registered Nurse Anesthetist

## 2021-01-17 ENCOUNTER — Inpatient Hospital Stay: Payer: Medicare HMO

## 2021-01-17 ENCOUNTER — Encounter
Admission: EM | Disposition: A | Discharge status: Home Health | Payer: Self-pay | Source: Home / Self Care | Attending: Obstetrics and Gynecology

## 2021-01-17 ENCOUNTER — Encounter: Payer: Self-pay | Admitting: Obstetrics and Gynecology

## 2021-01-17 HISTORY — PX: KYPHOPLASTY: SHX5884

## 2021-01-17 LAB — MAGNESIUM: Magnesium: 1.8 mg/dL (ref 1.7–2.4)

## 2021-01-17 LAB — BASIC METABOLIC PANEL
Anion gap: 6 (ref 5–15)
BUN: 16 mg/dL (ref 8–23)
CO2: 31 mmol/L (ref 22–32)
Calcium: 8.8 mg/dL — ABNORMAL LOW (ref 8.9–10.3)
Chloride: 92 mmol/L — ABNORMAL LOW (ref 98–111)
Creatinine, Ser: 0.47 mg/dL — ABNORMAL LOW (ref 0.61–1.24)
GFR, Estimated: >60 mL/min (ref >60)
Glucose, Bld: 171 mg/dL — ABNORMAL HIGH (ref 70–99)
Potassium: 4.5 mmol/L (ref 3.5–5.1)
Sodium: 129 mmol/L — ABNORMAL LOW (ref 135–145)

## 2021-01-17 LAB — GLUCOSE, CAPILLARY
Glucose-Capillary: 149 mg/dL — ABNORMAL HIGH (ref 70–99)
Glucose-Capillary: 119 mg/dL — ABNORMAL HIGH (ref 70–99)
Glucose-Capillary: 128 mg/dL — ABNORMAL HIGH (ref 70–99)

## 2021-01-17 SURGERY — KYPHOPLASTY
Anesthesia: General | Site: Spine Lumbar

## 2021-01-17 MED ORDER — LIDOCAINE HCL (PF) 1 % IJ SOLN
INTRAMUSCULAR | Status: DC | PRN
  Administered 2021-01-17: 40 mL

## 2021-01-17 MED ORDER — FENTANYL CITRATE (PF) 100 MCG/2ML IJ SOLN
25.0000 ug | INTRAMUSCULAR | Status: DC | PRN
  Administered 2021-01-17 (×3): 25 ug via INTRAVENOUS

## 2021-01-17 MED ORDER — CEFAZOLIN SODIUM-DEXTROSE 1-4 GM/50ML-% IV SOLN
INTRAVENOUS | Status: AC
  Filled 2021-01-17: qty 50

## 2021-01-17 MED ORDER — PREDNISONE 20 MG PO TABS
30.0000 mg | ORAL_TABLET | Freq: Every day | ORAL | Status: DC
  Administered 2021-01-18 – 2021-01-19 (×2): 30 mg via ORAL
  Filled 2021-01-17: qty 1
  Filled 2021-01-17: qty 2

## 2021-01-17 MED ORDER — DEXMEDETOMIDINE (PRECEDEX) IN NS 20 MCG/5ML (4 MCG/ML) IV SYRINGE
PREFILLED_SYRINGE | INTRAVENOUS | Status: DC | PRN
  Administered 2021-01-17 (×3): 8 ug via INTRAVENOUS

## 2021-01-17 MED ORDER — FENTANYL CITRATE (PF) 100 MCG/2ML IJ SOLN
INTRAMUSCULAR | Status: AC
  Administered 2021-01-17: 25 ug via INTRAVENOUS
  Filled 2021-01-17: qty 2

## 2021-01-17 MED ORDER — 0.9 % SODIUM CHLORIDE (POUR BTL) OPTIME
TOPICAL | Status: DC | PRN
  Administered 2021-01-17: 500 mL

## 2021-01-17 MED ORDER — SODIUM CHLORIDE 0.9 % IV SOLN: INTRAVENOUS | Status: DC | PRN

## 2021-01-17 MED ORDER — OXYCODONE HCL 5 MG/5ML PO SOLN: 5.0000 mg | Freq: Once | ORAL | Status: DC | PRN

## 2021-01-17 MED ORDER — PROPOFOL 10 MG/ML IV BOLUS
INTRAVENOUS | Status: AC
  Filled 2021-01-17: qty 20

## 2021-01-17 MED ORDER — ACETAMINOPHEN 10 MG/ML IV SOLN: 1000.0000 mg | Freq: Once | INTRAVENOUS | Status: DC | PRN

## 2021-01-17 MED ORDER — MIDAZOLAM HCL 2 MG/2ML IJ SOLN
INTRAMUSCULAR | Status: DC | PRN
  Administered 2021-01-17 (×2): 1 mg via INTRAVENOUS

## 2021-01-17 MED ORDER — LACTATED RINGERS IV SOLN: INTRAVENOUS | Status: DC | PRN

## 2021-01-17 MED ORDER — MIDAZOLAM HCL 2 MG/2ML IJ SOLN
INTRAMUSCULAR | Status: AC
  Filled 2021-01-17: qty 2

## 2021-01-17 MED ORDER — ONDANSETRON HCL 4 MG/2ML IJ SOLN: 4.0000 mg | Freq: Once | INTRAMUSCULAR | Status: DC | PRN

## 2021-01-17 MED ORDER — IOHEXOL 180 MG/ML  SOLN
INTRAMUSCULAR | Status: DC | PRN
  Administered 2021-01-17: 15 mL

## 2021-01-17 MED ORDER — ONDANSETRON HCL 4 MG/2ML IJ SOLN
INTRAMUSCULAR | Status: DC | PRN
  Administered 2021-01-17: 4 mg via INTRAVENOUS

## 2021-01-17 MED ORDER — KETAMINE HCL 10 MG/ML IJ SOLN
INTRAMUSCULAR | Status: DC | PRN
  Administered 2021-01-17: 20 mg via INTRAVENOUS

## 2021-01-17 MED ORDER — ENOXAPARIN SODIUM 40 MG/0.4ML IJ SOSY
40.0000 mg | PREFILLED_SYRINGE | INTRAMUSCULAR | Status: DC
  Administered 2021-01-18 – 2021-01-19 (×2): 40 mg via SUBCUTANEOUS
  Filled 2021-01-17 (×2): qty 0.4

## 2021-01-17 MED ORDER — LIDOCAINE HCL 1 % IJ SOLN
INTRAMUSCULAR | Status: DC | PRN
  Administered 2021-01-17: 10 mL

## 2021-01-17 MED ORDER — KETAMINE HCL 50 MG/5ML IJ SOSY
PREFILLED_SYRINGE | INTRAMUSCULAR | Status: AC
  Filled 2021-01-17: qty 5

## 2021-01-17 MED ORDER — OXYCODONE HCL 5 MG PO TABS: 5.0000 mg | ORAL_TABLET | Freq: Once | ORAL | Status: DC | PRN

## 2021-01-17 MED ORDER — FENTANYL CITRATE (PF) 100 MCG/2ML IJ SOLN
INTRAMUSCULAR | Status: AC
  Filled 2021-01-17: qty 2

## 2021-01-17 MED ORDER — PROPOFOL 500 MG/50ML IV EMUL
INTRAVENOUS | Status: DC | PRN
  Administered 2021-01-17: 30 ug/kg/min via INTRAVENOUS

## 2021-01-17 MED ORDER — FENTANYL CITRATE (PF) 100 MCG/2ML IJ SOLN
INTRAMUSCULAR | Status: DC | PRN
  Administered 2021-01-17: 12.5 ug via INTRAVENOUS
  Administered 2021-01-17: 25 ug via INTRAVENOUS
  Administered 2021-01-17: 12.5 ug via INTRAVENOUS
  Administered 2021-01-17 (×2): 25 ug via INTRAVENOUS

## 2021-01-17 SURGICAL SUPPLY — 25 items
ADH SKN CLS APL DERMABOND .7 (GAUZE/BANDAGES/DRESSINGS) ×2
CEMENT KYPHON CX01A KIT/MIXER (Cement) ×3 IMPLANT
DERMABOND ADVANCED (GAUZE/BANDAGES/DRESSINGS) ×1
DERMABOND ADVANCED .7 DNX12 (GAUZE/BANDAGES/DRESSINGS) ×2 IMPLANT
DEVICE BIOPSY BONE KYPHX (INSTRUMENTS) ×3 IMPLANT
DRAPE C-ARM XRAY 36X54 (DRAPES) ×3 IMPLANT
DURAPREP 26ML APPLICATOR (WOUND CARE) ×3 IMPLANT
GAUZE 4X4 16PLY ~~LOC~~+RFID DBL (SPONGE) ×3 IMPLANT
GLOVE SURG SYN 9.0  PF PI (GLOVE) ×1
GLOVE SURG SYN 9.0 PF PI (GLOVE) ×2 IMPLANT
GOWN SRG 2XL LVL 4 RGLN SLV (GOWNS) ×2 IMPLANT
GOWN STRL NON-REIN 2XL LVL4 (GOWNS) ×3
GOWN STRL REUS W/ TWL LRG LVL3 (GOWN DISPOSABLE) ×2 IMPLANT
GOWN STRL REUS W/TWL LRG LVL3 (GOWN DISPOSABLE) ×3
KIT OSTEOCOOL BONE ACCESS 8G (MISCELLANEOUS) ×6 IMPLANT
KIT OSTEOCOOL DUAL PROBE 20MM (MISCELLANEOUS) ×3 IMPLANT
MANIFOLD NEPTUNE II (INSTRUMENTS) ×3 IMPLANT
PACK KYPHOPLASTY (MISCELLANEOUS) ×3 IMPLANT
RENTAL RFA GENERATOR (MISCELLANEOUS) ×3 IMPLANT
STRAP SAFETY 5IN WIDE (MISCELLANEOUS) ×3 IMPLANT
SWABSTK COMLB BENZOIN TINCTURE (MISCELLANEOUS) ×3 IMPLANT
TOWEL OR 17X26 4PK STRL BLUE (TOWEL DISPOSABLE) ×3 IMPLANT
TRAY KYPHOPAK 15/3 EXPRESS 1ST (MISCELLANEOUS) IMPLANT
TRAY KYPHOPAK 20/3 EXPRESS 1ST (MISCELLANEOUS) ×3 IMPLANT
WATER STERILE IRR 500ML POUR (IV SOLUTION) ×3 IMPLANT

## 2021-01-17 NOTE — Progress Notes (Signed)
PROGRESS NOTE    Donald Lowery  WCH:[id number] DOB: 1950-07-26 DOA: 01/12/2021 PCP: [name], NP  Outpatient Specialists: oncology, cardiology    Brief Narrative:   Donald Lowery is a 70 y.o. 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  No falls, no trauma.  He was seen yesterday by his oncologist and had been on prednisone taper pack which helped relieve his pain.  He denies any chest pain shortness of breath cough or hemoptysis.  He denies any abdominal pain.  No nausea or vomiting, constipation or diarrhea.  No urinary symptoms.  EDP requested admission for symptoms management and pain control.   Assessment & Plan:   Active Problems:   Intractable back pain   Intractable pain   Pressure injury of skin  # Intractable pain secondary to bony metastases # Metastatic lung cancer. Known mets to left iliac crest; current pain right iliac rest region. Lumbar spine x ray showing new L3 compression deformity. Unable to ambulate secondary to pain. On ms contin 15 bid at home with oxy 10 prn. Pain somewhat improved. Recently completed chemotherapy. Has been referred to orthopedics as outpt but hasn't been yet. Recently also saw pain mgmt.  - started fentanyl patch 25 mcg - increased dilaudid to 2 mg q2 prn - miralax/senna - narcan prn - onc palliative notified - re-started prednisone @ 40 mg daily, will start taper today - pt/ot consults pending - plan for kyphoplasty today - plan for radiation therapy to start tomorrow - in lso brace - declines snf but is ok with home health pt  # Dysphagia SLP  consulted. 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 MRI shows l3 pathologic fracture with extension of tumor into canal resulting in severe canal and foraminal stenosis. - plan for kyphoplasty today, radiation therapy starting tomorrow - lso brace  # Hyponatremia Chronic, most likely siadh from pulmonary mets. Is at baseline and stable - monitor  # Hypokalemia Resolved  # Hyperkalemia Iatrogenic, resolved  # T2DM Here glucose wnl - home metformin - daily fasting sugar  # CAD # Hx NSTEMI Nstemi 2020, medical mgmt w/ diffuse disease on cath - cont home statin, zetia, imdur  # HTN Hx of, now off meds, bp here wnl - monitor  # Malnutrition, moderate - nutrition supplement   DVT prophylaxis: lovenox Code Status: full Family Communication: wife updated @ bedside 9/20  Level of care: Progressive Cardiac Status is: inpt  The patient will require care spanning > 2 midnights and should be moved to inpatient because: Inpatient level of care appropriate due to severity of illness  Dispo:  Patient From: Home  Planned Disposition: home w/ home health  Medically stable for discharge: No       Consultants:  Oncology notified  Procedures: none  Antimicrobials:  none    Subjective: Pain continues, improved from presentation. Tolerating diet. No new neurologic complaints.  Objective: Vitals:   01/16/21 1940 01/16/21 2341 01/17/21 0400 01/17/21 1030  BP: (!) 142/82 (!) 141/81 (!) 146/95 133/80  Pulse: (!) 110 (!) 110 98 87  Resp: 18 18 20 18   Temp: 98.2 F (36.8 C) 98.2 F (36.8 C) 98.5 F (36.9 C) (!) 97.5 F (36.4 C)  TempSrc: Oral Oral Oral Oral  SpO2: 99% 95% 98% 97%  Weight:      Height:        Intake/Output Summary (Last 24 hours) at 01/17/2021  1055 Last data filed at 01/16/2021 1842 Gross per 24 hour  Intake 240 ml  Output 250 ml  Net -10 ml   Filed Weights   01/12/21 1519 01/13/21 2345  Weight: 53.3 kg 53 kg    Examination:  General exam: Appears calm and comfortable, in pain when moves.  Respiratory system: Clear to auscultation. Respiratory effort normal. Cardiovascular system: S1 & S2 heard, RRR. No JVD, murmurs, rubs, gallops or clicks. No pedal edema. Gastrointestinal system: Abdomen is nondistended, soft and nontender. No organomegaly or masses felt. Normal bowel sounds heard. Central nervous system: Alert and oriented. No focal neurological deficits. Extremities: Symmetric 5 x 5 power. Skin: No rashes, lesions or ulcers Psychiatry: Judgement and insight appear normal. Mood & affect appropriate.     Data Reviewed: I have personally reviewed following labs and imaging studies  CBC: Recent Labs  Lab 01/11/21 0835 01/12/21 1816 01/15/21 0554 01/16/21 0617  WBC 5.2 4.2 4.1 4.5  NEUTROABS 4.3 3.6  --   --   HGB 8.7* 8.9* 9.0* 8.3*  HCT 26.0* 25.9* 26.3* 24.0*  MCV 87.0 86.9 88.0 87.9  PLT 280 250 219 789   Basic Metabolic Panel: Recent Labs  Lab 01/13/21 1452 01/14/21 0551 01/15/21 0554 01/15/21 0654 01/15/21 1711 01/16/21 0617 01/17/21 0436  NA 133* 129* 125* 125* 126* 127* 129*  K 4.8 5.1 6.0* 5.6* 5.1 4.6 4.5  CL 96* 94* 89* 91* 90* 93* 92*  CO2 28 27 31 30 30 29 31   GLUCOSE 93 113* 181* 179* 234* 182* 171*  BUN 15 18 17 17 17 15 16   CREATININE 0.53* 0.58* 0.55* 0.50* 0.65 0.62 0.47*  CALCIUM 7.9* 8.1* 8.9 8.6* 8.6* 8.7* 8.8*  MG 1.8 1.8 1.8  --   --  1.8 1.8   GFR: Estimated Creatinine Clearance: 63.5 mL/min (A) (by C-G formula based on SCr of 0.47 mg/dL (L)). Liver Function Tests: Recent Labs  Lab 01/11/21 0835 01/12/21 1816  AST 16 19  ALT 19 20  ALKPHOS 158* 134*  BILITOT 1.0 0.6  PROT 6.4* 5.9*  ALBUMIN 3.0* 2.6*   No results for input(s): LIPASE, AMYLASE in the last 168  hours. No results for input(s): AMMONIA in the last 168 hours. Coagulation Profile: No results for input(s): INR, PROTIME in the last 168 hours. Cardiac Enzymes: No results for input(s): CKTOTAL, CKMB, CKMBINDEX, TROPONINI in the last 168 hours. BNP (last 3 results) No results for input(s): PROBNP in the last 8760 hours. HbA1C: No results for input(s): HGBA1C in the last 72 hours.  CBG: Recent Labs  Lab 01/14/21 1138 01/14/21 2041 01/15/21 0612 01/16/21 0612 01/17/21 0540  GLUCAP 136* 171* 153* 165* 149*   Lipid Profile: No results for input(s): CHOL, HDL, LDLCALC, TRIG, CHOLHDL, LDLDIRECT in the last 72 hours. Thyroid Function Tests: No results for input(s): TSH, T4TOTAL, FREET4, T3FREE, THYROIDAB in the last 72 hours. Anemia Panel: No results for input(s): VITAMINB12, FOLATE, FERRITIN, TIBC, IRON, RETICCTPCT in the last 72 hours. Urine analysis: No results found for: COLORURINE, APPEARANCEUR, [place], [place], [place],  HGBUR, BILIRUBINUR, KETONESUR, PROTEINUR, UROBILINOGEN, NITRITE, LEUKOCYTESUR Sepsis Labs: @LABRCNTIP (procalcitonin:4,lacticidven:4)  ) Recent Results (from the past 240 hour(s))  Resp Panel by RT-PCR (Flu A&B, Covid) Nasopharyngeal Swab     Status: None   Collection Time: 01/12/21  6:16 PM   Specimen: Nasopharyngeal Swab; Nasopharyngeal(NP) swabs in vial transport medium  Result Value Ref Range Status   SARS Coronavirus 2 by RT PCR NEGATIVE NEGATIVE Final    Comment: (NOTE) SARS-CoV-2 target nucleic acids are NOT DETECTED.  The SARS-CoV-2 RNA is generally detectable in upper respiratory specimens during the acute phase of infection. The lowest concentration of SARS-CoV-2 viral copies this assay can detect is 138 copies/mL. A negative result does not preclude SARS-Cov-2 infection and should not be used as the sole basis for treatment or other patient management decisions. A negative result may occur with  improper specimen collection/handling,  submission of specimen other than nasopharyngeal swab, presence of viral mutation(s) within the areas targeted by this assay, and inadequate number of viral copies(<138 copies/mL). A negative result must be combined with clinical observations, patient history, and epidemiological information. The expected result is Negative.  Fact Sheet for Patients:  EntrepreneurPulse.com.au  Fact Sheet for Healthcare Providers:  IncredibleEmployment.be  This test is no t yet approved or cleared by the [place] FDA and  has been authorized for detection and/or diagnosis of SARS-CoV-2 by FDA under an Emergency Use Authorization (EUA). This EUA will remain  in effect (meaning this test can be used) for the duration of the COVID-19 declaration under Section 564(b)(1) of the Act, 21 U.S.C.section 360bbb-3(b)(1), unless the authorization is terminated  or revoked sooner.       Influenza A by PCR NEGATIVE NEGATIVE Final   Influenza B by PCR NEGATIVE NEGATIVE Final    Comment: (NOTE) The Xpert Xpress SARS-CoV-2/FLU/RSV plus assay is intended as an aid in the diagnosis of influenza from Nasopharyngeal swab specimens and should not be used as a sole basis for treatment. Nasal washings and aspirates are unacceptable for Xpert Xpress SARS-CoV-2/FLU/RSV testing.  Fact Sheet for Patients: EntrepreneurPulse.com.au  Fact Sheet for Healthcare Providers: IncredibleEmployment.be  This test is not yet approved or cleared by the [place] FDA and has been authorized for detection and/or diagnosis of SARS-CoV-2 by FDA under an Emergency Use Authorization (EUA). This EUA will remain in effect (meaning this test can be used) for the duration of the COVID-19 declaration under Section 564(b)(1) of the Act, 21 U.S.C. section 360bbb-3(b)(1), unless the authorization is terminated or revoked.  Performed at [facility], [address] [place], [place] [address]          Radiology Studies: No results found.      Scheduled Meds:  vitamin C  500 mg Oral BID   aspirin EC  81 mg Oral Daily   atorvastatin  80 mg Oral q1800   ezetimibe  10 mg Oral Daily   feeding supplement  237 mL Oral TID BM   fentaNYL  1 patch Transdermal Q72H   isosorbide mononitrate  30 mg Oral Daily   lidocaine  1 patch Transdermal Q12H   metFORMIN  500 mg Oral Q breakfast   multivitamin with minerals  1 tablet Oral Daily   polyethylene glycol  17 g Oral Daily   predniSONE  40 mg Oral Q breakfast   senna  2 tablet Oral Daily   vitamin B-12  1,000 mcg Oral Daily   Continuous Infusions:   ceFAZolin (ANCEF) IV  LOS: 4 days    Time spent: 20 min    Desma Maxim, MD [facility]   If 7PM-7AM, please contact night-coverage [email or web address] Password [facility] 01/17/2021, 10:55 AM

## 2021-01-17 NOTE — Progress Notes (Signed)
SLP Cancellation Note  Patient Details Name: Donald Lowery MRN: [id number] DOB: 1950-09-10   Cancelled treatment:       Reason Eval/Treat Not Completed: Patient at procedure or test/unavailable (pt is off the floor. Will f/u tomorrow.)      Orinda Kenner, MS, CCC-SLP Speech Language Pathologist Rehab Services [phone] [facility] 01/17/2021, 3:58 PM

## 2021-01-17 NOTE — Anesthesia Procedure Notes (Signed)
Procedure Name: MAC Date/Time: 01/17/2021 3:23 PM Performed by: Lily Peer, Finnian Husted, CRNA Pre-anesthesia Checklist: Patient identified, Suction available, Patient being monitored, Timeout performed and Emergency Drugs available Patient Re-evaluated:Patient Re-evaluated prior to induction Oxygen Delivery Method: Nasal cannula Preoxygenation: Pre-oxygenation with 100% oxygen Induction Type: IV induction

## 2021-01-17 NOTE — Transfer of Care (Signed)
Immediate Anesthesia Transfer of Care Note  Patient: Donald Lowery  Procedure(s) Performed: Claybon Jabs RFA (Spine Lumbar)  Patient Location: PACU  Anesthesia Type:MAC  Level of Consciousness: drowsy  Airway & Oxygen Therapy: Patient Spontanous Breathing and Patient connected to face mask oxygen  Post-op Assessment: Report given to RN and Post -op Vital signs reviewed and stable  Post vital signs: Reviewed and stable  Last Vitals:  Vitals Value Taken Time  BP 96/63 01/17/21 1624  Temp    Pulse 80 01/17/21 1626  Resp 15 01/17/21 1626  SpO2 99 % 01/17/21 1626  Vitals shown include unvalidated device data.  Last Pain:  Vitals:   01/17/21 1455  TempSrc: Oral  PainSc: 10-Worst pain ever      Patients Stated Pain Goal: 2 (19/41/74 0814)  Complications: No notable events documented.

## 2021-01-17 NOTE — Anesthesia Preprocedure Evaluation (Signed)
Anesthesia Evaluation  Patient identified by MRN, date of birth, ID band Patient awake    Reviewed: Allergy & Precautions, NPO status , Patient's Chart, lab work & pertinent test results  History of Anesthesia Complications Negative for: history of anesthetic complications  Airway Mallampati: III  TM Distance: >3 FB Neck ROM: Full    Dental  (+) Edentulous Upper, Edentulous Lower   Pulmonary neg sleep apnea, COPD, former smoker,    breath sounds clear to auscultation- rhonchi (-) wheezing      Cardiovascular hypertension, Pt. on medications + CAD and + Past MI  (-) Cardiac Stents and (-) CABG  Rhythm:Regular Rate:Normal - Systolic murmurs and - Diastolic murmurs    Neuro/Psych neg Seizures negative neurological ROS  negative psych ROS   GI/Hepatic negative GI ROS, Neg liver ROS,   Endo/Other  diabetes, Oral Hypoglycemic Agents  Renal/GU negative Renal ROS     Musculoskeletal negative musculoskeletal ROS (+)   Abdominal (+) - obese,   Peds  Hematology negative hematology ROS (+)   Anesthesia Other Findings Past Medical History: No date: Aortic atherosclerosis ([place]) 09/21/2020: Bochdalek hernia     Comment:  fatty No date: Coronary artery disease     Comment:  a. 04/2018 NSTEMI/Cath: LM nl, LAD 50p, 26m/d               diffuse-small caliber, LCX heavily Ca2+ sev prox/mid dzs,              RCA 90 diff distal dzs into RPL and RPDA. EF 50-55%-->Med              Rx. No date: Hepatic steatosis 10/12/2020: History of 2019 novel coronavirus disease (COVID-19) No date: History of echocardiogram     Comment:  a. 04/2018 Echo: EF 55-60%, no rwma, mild MR, mildly dil               LA. Nl RV fxn. 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 Quit 04/2018.   Reproductive/Obstetrics                             Anesthesia Physical  Anesthesia Plan  ASA: 3  Anesthesia Plan: General   Post-op Pain Management:    Induction: Intravenous  PONV Risk Score and Plan: 2 and Ondansetron and Dexamethasone  Airway Management Planned: Oral ETT  Additional Equipment: None  Intra-op Plan:   Post-operative Plan: Extubation in OR  Informed Consent: I have reviewed the patients History and Physical, chart, labs and discussed the procedure including the risks, benefits and alternatives for the proposed anesthesia with the patient or authorized representative who has indicated his/her understanding and acceptance.     Dental advisory given  Plan Discussed with: CRNA and Anesthesiologist  Anesthesia Plan Comments: (Discussed risks of anesthesia with patient, including possibility of difficulty with spontaneous ventilation under anesthesia necessitating airway intervention, PONV, and rare risks such as cardiac or respiratory or neurological events, and allergic reactions. Patient understands.)        Anesthesia Quick Evaluation

## 2021-01-17 NOTE — Progress Notes (Signed)
OT Cancellation Note  Patient Details Name: Donald Lowery MRN: [id number] DOB: October 17, 1950   Cancelled Treatment:    Reason Eval/Treat Not Completed: Other (comment).  Pt is currently scheduled for kyphoplasty later this evening.  Will perform re-evaluation tomorrow s/p surgical intervention pending new orders as medically appropriate.  Dessie Coma, M.S. OTR/L  01/17/21, 10:19 AM  ascom [phone]

## 2021-01-17 NOTE — Progress Notes (Signed)
PT Cancellation Note  Patient Details Name: Donald Lowery MRN: [id number] DOB: January 22, 1951   Cancelled Treatment:    Reason Eval/Treat Not Completed: Other (comment).  Pt chart reviewed by therapist.  Pt is currently scheduled for kyphoplasty later this evening.  Will perform evaluation tomorrow s/p surgical intervention and as medically appropriate.   Gwenlyn Saran, PT, DPT 01/17/21, 9:23 AM

## 2021-01-17 NOTE — Progress Notes (Signed)
Site markded, patient to OR for L3 RFA and kyphoplasty

## 2021-01-17 NOTE — Op Note (Signed)
01/17/2021  4:22 PM  PATIENT:  Donald Lowery   MRN: [id number]   PRE-OPERATIVE DIAGNOSIS:  closed wedge compression fracture of L3, pathologic secondary to metastatic cancer   POST-OPERATIVE DIAGNOSIS:  closed wedge compression fracture of L3 same   PROCEDURE:  Procedure(s): KYPHOPLASTY L3 with osteool RFA to tumor  SURGEON: Laurene Footman, MD   ASSISTANTS: None   ANESTHESIA:   local and MAC   EBL:  No intake/output data recorded.   BLOOD ADMINISTERED:none   DRAINS: none    LOCAL MEDICATIONS USED:  MARCAINE    and XYLOCAINE    SPECIMEN: L3 vertebral body biopsy   DISPOSITION OF SPECIMEN: Pathology   COUNTS:  YES   TOURNIQUET:  * No tourniquets in log *   IMPLANTS: Bone cement   DICTATION: .Dragon Dictation  patient was brought to the operating room and after adequate anesthesia was obtained the patient was placed prone.  C arm was brought in in good visualization of the affected level obtained on both AP and lateral projections.  After patient identification and timeout procedures were completed, local anesthetic was infiltrated with 10 cc 1% Xylocaine infiltrated subcutaneously.  This is done the area on the each side of the planned approach.  The back was then prepped and draped in the usual sterile manner and repeat timeout procedure carried out.  A spinal needle was brought down to the pedicle on the each side of L3 and a 50-50 mix of 1% Xylocaine half percent Sensorcaine with epinephrine total of 20 cc injected on each side.  After allowing this to set a small incision was made and the trocar was advanced into the vertebral body in an extrapedicular fashion.  Biopsy was obtained on the left not obtained on the right.  Drilling was carried out.  Based on this 20 mm probes were inserted on each side and 15 minutes of radiofrequency ablation carried out.  Balloon inserted with inflation to 2.0 cc on the right and 2.5 cc on the left.  When the cement was appropriate consistency  4.5 cc were injected on the right and 3.0 cc on the left into the vertebral body with slight extravasation on the right side laterally from the body but not towards the neural foramen or canal., good fill superior to inferior endplates and from right to left sides along the inferior endplate.  After the cement had set the trochars were removed and permanent C-arm views obtained.  The wounds were closed with Dermabond followed by Band-Aid   PLAN OF CARE: Continue as inpatient   PATIENT DISPOSITION:  PACU - hemodynamically stable.

## 2021-01-17 NOTE — Anesthesia Postprocedure Evaluation (Signed)
Anesthesia Post Note  Patient: Donald Lowery  Procedure(s) Performed: Claybon Jabs RFA (Spine Lumbar) RADIO FREQUENCY ABLATION OF BONE  Patient location during evaluation: PACU Anesthesia Type: General Level of consciousness: awake and alert Pain management: pain level controlled Vital Signs Assessment: post-procedure vital signs reviewed and stable Respiratory status: spontaneous breathing, nonlabored ventilation, respiratory function stable and patient connected to nasal cannula oxygen Cardiovascular status: blood pressure returned to baseline and stable Postop Assessment: no apparent nausea or vomiting Anesthetic complications: no   No notable events documented.   Last Vitals:  Vitals:   01/17/21 1700 01/17/21 1728  BP: 130/67 (!) 153/83  Pulse: 81 90  Resp: 10 18  Temp: (!) 36.3 C (!) 36.4 C  SpO2: 96% 98%    Last Pain:  Vitals:   01/17/21 1730  TempSrc:   PainSc: [place]

## 2021-01-18 ENCOUNTER — Encounter: Payer: Self-pay | Admitting: Orthopedic Surgery

## 2021-01-18 ENCOUNTER — Ambulatory Visit: Payer: Medicare HMO

## 2021-01-18 DIAGNOSIS — E871 Hypo-osmolality and hyponatremia: Secondary | ICD-10-CM

## 2021-01-18 DIAGNOSIS — E43 Unspecified severe protein-calorie malnutrition: Secondary | ICD-10-CM | POA: Insufficient documentation

## 2021-01-18 DIAGNOSIS — S32030A Wedge compression fracture of third lumbar vertebra, initial encounter for closed fracture: Secondary | ICD-10-CM

## 2021-01-18 DIAGNOSIS — R000 Tachycardia, unspecified: Secondary | ICD-10-CM

## 2021-01-18 LAB — BASIC METABOLIC PANEL
Anion gap: 6 (ref 5–15)
BUN: 13 mg/dL (ref 8–23)
CO2: 30 mmol/L (ref 22–32)
Calcium: 8.4 mg/dL — ABNORMAL LOW (ref 8.9–10.3)
Chloride: 92 mmol/L — ABNORMAL LOW (ref 98–111)
Creatinine, Ser: 0.53 mg/dL — ABNORMAL LOW (ref 0.61–1.24)
GFR, Estimated: >60 mL/min (ref >60)
Glucose, Bld: 135 mg/dL — ABNORMAL HIGH (ref 70–99)
Potassium: 4.2 mmol/L (ref 3.5–5.1)
Sodium: 128 mmol/L — ABNORMAL LOW (ref 135–145)

## 2021-01-18 LAB — GLUCOSE, CAPILLARY: Glucose-Capillary: 130 mg/dL — ABNORMAL HIGH (ref 70–99)

## 2021-01-18 LAB — MAGNESIUM: Magnesium: 1.8 mg/dL (ref 1.7–2.4)

## 2021-01-18 MED ORDER — GABAPENTIN 100 MG PO CAPS
100.0000 mg | ORAL_CAPSULE | Freq: Three times a day (TID) | ORAL | Status: DC
  Administered 2021-01-18 – 2021-01-19 (×3): 100 mg via ORAL
  Filled 2021-01-18 (×3): qty 1

## 2021-01-18 MED ORDER — METOPROLOL TARTRATE 25 MG PO TABS
25.0000 mg | ORAL_TABLET | Freq: Two times a day (BID) | ORAL | Status: DC
  Administered 2021-01-18 – 2021-01-19 (×3): 25 mg via ORAL
  Filled 2021-01-18 (×3): qty 1

## 2021-01-18 MED ORDER — FENTANYL 50 MCG/HR TD PT72
1.0000 | MEDICATED_PATCH | TRANSDERMAL | Status: DC
  Administered 2021-01-19: 1 via TRANSDERMAL
  Filled 2021-01-18: qty 1

## 2021-01-18 MED ORDER — SENNOSIDES-DOCUSATE SODIUM 8.6-50 MG PO TABS
2.0000 | ORAL_TABLET | Freq: Two times a day (BID) | ORAL | Status: DC
  Administered 2021-01-18 – 2021-01-19 (×3): 2 via ORAL
  Filled 2021-01-18 (×3): qty 2

## 2021-01-18 MED ORDER — MELATONIN 5 MG PO TABS: 2.5000 mg | ORAL_TABLET | Freq: Every evening | ORAL | Status: DC | PRN

## 2021-01-18 MED ORDER — HYDROMORPHONE HCL 1 MG/ML IJ SOLN
2.0000 mg | INTRAMUSCULAR | Status: DC | PRN
  Administered 2021-01-18 – 2021-01-19 (×4): 2 mg via INTRAVENOUS
  Filled 2021-01-18 (×4): qty 2

## 2021-01-18 MED ORDER — OXYCODONE HCL 5 MG PO TABS
10.0000 mg | ORAL_TABLET | ORAL | Status: DC | PRN
  Administered 2021-01-19: 10 mg via ORAL
  Filled 2021-01-18: qty 2

## 2021-01-18 NOTE — Evaluation (Addendum)
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  No falls, no trauma.  He was seen yesterday by his oncologist and had been on prednisone taper pack which helped relieve his pain. Pt adm for intractable back pain 2/2 bony metastases. Pt now s/p kyphoplasty 9/20 ([facility]) d/t L3 closed wedge compression fx.   Clinical Impression    Pt seen for OT re-evaluation this date following kyphoplasty on 9/20. Pt presents still largely pain limited, but now reporting pain primarily in feet rather than back. OT educates pt on efforts to reduce bending and twisting including potential use of AE for LB ADLs. OT also spends majority of time educating pt and spouse re: importance of exercising some while in chair/in bed outside of therapy time to ensure reduction in risk to atrophy. Pt's spouse with good understanding, pt with only minimal reception. OT notes that pt is requiring MIN A for ADL transfers and is noted to have hips flexed in standing. OT engages pt in seated GS for one set x10 reps to improve posture and reduce hip flexion in standing. Pt left in chair at end of session. OT also discusses with both parties weighing the pros and cons of d/c recommendations/options including rehab versus [place]. Pt and spouse preferring home, but pt currently requiring increased assist post-op'ly than he was at start of hospital stay. D/c rec updated to SNF to reflect increased mobility assist needs. However will continue to monitor for  progress towards OT goals.    Recommendations for follow up therapy are one component of a multi-disciplinary discharge planning process, led by the attending physician.  Recommendations may be updated based on patient status, additional functional criteria and insurance authorization.   Follow Up Recommendations  SNF    Equipment Recommendations  3 in 1 bedside commode;Tub/shower seat    Recommendations for Other Services       Precautions / Restrictions Precautions Precautions: Fall Required Braces or Orthoses: Spinal Brace Spinal Brace: Applied in sitting position (LSO) Restrictions Weight Bearing Restrictions: No      Mobility Bed Mobility  General bed mobility comments: pt up to chair pre/post OT session    Transfers Overall transfer level: Needs assistance Equipment used: Rolling walker (2 wheeled) Transfers: Sit to/from Stand Sit to Stand: Min assist         General transfer comment: increased time, cues for safety with sequencing use of RW    Balance Overall balance assessment: Needs assistance Sitting-balance support: Feet supported;Bilateral upper extremity supported Sitting balance-Leahy Scale: Fair Sitting balance - Comments: Pt with fair sitting balance.   Standing balance support: Bilateral upper extremity supported Standing balance-Leahy Scale: Fair Standing balance comment: UE support to sustain static standing d/t weakness                           ADL either performed or assessed with clinical judgement   ADL Overall ADL's : Needs assistance/impaired Eating/Feeding: Set up;Sitting   Grooming: Set up;Sitting  Upper Body Dressing : Set up;Sitting   Lower Body Dressing: Maximal assistance;Sitting/lateral leans Lower Body Dressing Details (indicate cue type and reason): d/t limitations 2/2 back pain, would benefit from AE training             Functional mobility during ADLs: Min guard;Minimal assistance;Rolling  walker (pt with very limited tolerance, can only complete ~10-15' at a time, benefits from a chair follow)       Vision Patient Visual Report: No change from baseline       Perception     Praxis      Pertinent Vitals/Pain Pain Assessment: Faces Faces Pain Scale: Hurts whole lot Pain Location: back/feet Pain Descriptors / Indicators: Pins and needles;Grimacing Pain Intervention(s): Limited activity within patient's tolerance;Monitored during session;Repositioned     Hand Dominance Left   Extremity/Trunk Assessment Upper Extremity Assessment Upper Extremity Assessment: Generalized weakness   Lower Extremity Assessment Lower Extremity Assessment: Generalized weakness       Communication Communication Communication: No difficulties   Cognition Arousal/Alertness: Awake/alert Behavior During Therapy: WFL for tasks assessed/performed Overall Cognitive Status: Difficult to assess                                 General Comments: while pt is alert, he perseverates on "getting back in the bed" and does not make much other conversation. Follows ~80-90% of commands and is apprporiate, but generally somewhat decreased/delayed responses. Could be r/t HOH.   General Comments       Exercises  Other Exercises: OT ed wtih pt and spouse re: importance of moving limbs and trunk (within his tolerance and considerations re: recent sx) to maintain circulation and musculature/prevent atrophy. Pt's spouse very engaged in eduation, but pt will require ongoing ed. In addition, ed re: d/c recommendations and weighing cost/benefit. Other Exercises: Pt and spouse educated on role of PT and services provided during hospital stay.  Pt also encouraged to not stay in bed due to pain and to move in order to prevent muscle atrophy and improve overall QoL.   Shoulder Instructions      Home Living Family/patient expects to be discharged to:: Private residence Living Arrangements:  Spouse/significant other Available Help at Discharge: Family;Available 24 hours/day Type of Home: House Home Access: Stairs to enter [employer] of Steps: Front 4; Back 8 Entrance Stairs-Rails: Can reach both                 Home Equipment: Walker - 4 wheels;Cane - single point          Prior Functioning/Environment Level of Independence: Independent        Comments: Pt's wife is present and reports pt was able to do everyhting on his own including walking, bathing and doing things around the home.        OT Problem List: Decreased strength;Decreased activity tolerance;Impaired balance (sitting and/or standing);Decreased knowledge of use of DME or AE;Pain      OT Treatment/Interventions: Self-care/ADL training;Therapeutic exercise;Energy conservation;DME and/or AE instruction;Therapeutic activities;Patient/family education    OT Goals(Current goals can be found in the care plan section) Acute Rehab OT Goals Patient Stated Goal: to reduce pain and go home OT Goal Formulation: With patient/family Time For Goal Achievement: 02/01/21 Potential to Achieve Goals: Good ADL Goals Pt Will Perform Lower Body Dressing: with min guard assist;with min assist;with adaptive equipment;sit to/from stand (AE and LRAD) Pt Will Transfer to Toilet: with min guard assist;with  min assist;ambulating;bedside commode;grab bars (BSC over standard toilet to elevate to reduce back pain and use of toilet rails to control descent) Pt Will Perform Toileting - Clothing Manipulation and hygiene: with min guard assist;sit to/from stand (with AE PRN to reach back while reducing twisting)  OT Frequency: Min 1X/week   Barriers to D/C:            Co-evaluation              AM-PAC OT "6 Clicks" Daily Activity     Outcome Measure Help from another person eating meals?: None Help from another person taking care of personal grooming?: A Little Help from another person toileting, which  includes using toliet, bedpan, or urinal?: A Lot Help from another person bathing (including washing, rinsing, drying)?: A Lot Help from another person to put on and taking off regular upper body clothing?: A Little Help from another person to put on and taking off regular lower body clothing?: A Lot 6 Click Score: 16   End of Session Equipment Utilized During Treatment: Gait belt;Rolling walker Nurse Communication: Mobility status  Activity Tolerance: Patient tolerated treatment well Patient left: with family/visitor present;in chair;with chair alarm set;with call bell/phone within reach  OT Visit Diagnosis: Unsteadiness on feet (R26.81);Muscle weakness (generalized) (M62.81);Pain Pain - Right/Left: Right Pain - part of body: Leg (pt reports L LE feels "numb, pins and needles")                Time: 1213-1224 OT Time Calculation (min): 11 min Charges:  OT General Charges $OT Visit: 1 Visit OT Evaluation $OT Re-eval: 1 Re-eval  Gerrianne Scale, MS, OTR/L ascom [phone] 01/18/21, 4:18 PM

## 2021-01-18 NOTE — Progress Notes (Signed)
Inpatient Diabetes Program Recommendations  AACE/ADA: New Consensus Statement on Inpatient Glycemic Control   Target Ranges:  Prepandial:   less than 140 mg/dL      Peak postprandial:   less than 180 mg/dL (1-2 hours)      Critically ill patients:  140 - 180 mg/dL   Results for Donald Lowery, Donald Lowery (MRN [id number]) as of 01/18/2021 11:25  Ref. Range 01/17/2021 05:40 01/17/2021 14:56 01/17/2021 16:26 01/18/2021 07:43  Glucose-Capillary Latest Ref Range: 70 - 99 mg/dL 149 (H) 119 (H) 128 (H) 130 (H)   Results for Donald Lowery, Donald Lowery (MRN [id number]) as of 01/18/2021 11:25  Ref. Range 01/12/2021 18:16  Hemoglobin A1C Latest Ref Range: 4.8 - 5.6 % 6.1 (H)   Review of Glycemic Control  Diabetes history: DM2 Outpatient Diabetes medications: Metformin 500 mg QAM Current orders for Inpatient glycemic control: Metformin 500 mg QAM; Prednisone 30 mg QAM  Inpatient Diabetes Program Recommendations:    Insulin: While inpatient please order Novolog 0-6 units TID with meals.  NOTE: Noted consult for assistance with inpatient glycemic control. Chart reviewed. Patient admitted with intractable back pain due to bone metastases drom lung cancer, dysphagia, new compression fracture of L3.   Thanks, Barnie Alderman, RN, MSN, CDE Diabetes Coordinator Inpatient Diabetes Program [phone] (Team Pager from 8am to 5pm)

## 2021-01-18 NOTE — Progress Notes (Signed)
Transferred patient to [facility]

## 2021-01-18 NOTE — Evaluation (Signed)
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  No falls, no trauma.  He was seen yesterday by his oncologist and had been on prednisone taper pack which helped relieve his pain. Pt adm for intractable back pain 2/2 bony metastases.     Clinical Impression  Pt received in Semi-Fowler's position and agreeable to therapy.  Pt continues to report significant leg weakness in the R LE and back pain (9/10 with Toradol given prior to therapy).  Pt requires maximal encouragement to participate in exercise and mobility training.  Pt has heavy labored breathing after ambulating ~25 feet and requests to sit down.  OT brought chair and pt has seated rest break before ambulating another 25 feet and requiring a seat in recliner and transferred back to room. Pt HR elevated throughout session, remaining >100 throughout the session climbing to 142 according to pulse oximeter.  Pt was removed from Telemetry monitoring prior to therapy evaluation.  Communicated with nursing about HR values, with nurse noting that medication was given to him during PT session for heart complications.  Pt and wife were educated on role of PT and the recommendation of SNF due to endurance deficits and both were in agreement.  Pt will benefit from skilled PT intervention to increase independence and safety with basic mobility in preparation for discharge to the venue listed below.          Recommendations for follow up therapy are one component of a  multi-disciplinary discharge planning process, led by the attending physician.  Recommendations may be updated based on patient status, additional functional criteria and insurance authorization.  Follow Up Recommendations SNF;Supervision/Assistance - 24 hour    Equipment Recommendations  Rolling walker with 5" wheels    Recommendations for Other Services       Precautions / Restrictions Precautions Precautions: Fall Restrictions Weight Bearing Restrictions: No      Mobility  Bed Mobility Overal bed mobility: Needs Assistance Bed Mobility: Sidelying to Sit;Sit to Sidelying   Sidelying to sit: Min assist     Sit to sidelying: Min assist General bed mobility comments: Pt requiring verbal, tactile, and minA for log roll into sitting position.    Transfers Overall transfer level: Needs assistance Equipment used: Rolling walker (2 wheeled) Transfers: Sit to/from Stand Sit to Stand: Min assist         General transfer comment: cues for hand palcement with RW  Ambulation/Gait Ambulation/Gait assistance: Min guard;Min assist Gait Distance (Feet): 50 Feet Assistive device: Rolling walker (2 wheeled) Gait Pattern/deviations: Step-to pattern;Decreased step length - right;Decreased step length - left;Decreased stride length Gait velocity: decreased   General Gait Details: Pt has very inconsistent gait pattern, at times not stepping, but performing swing to gait pattern with FWW.  Pt advised to use the walker in order to support weight, but to take steps.  Pt fatigues quickly and required chair follow from OT due to endurance deficit.  Stairs            Wheelchair Mobility    Modified  Rankin (Stroke Patients Only)       Balance Overall balance assessment: Needs assistance Sitting-balance support: Feet supported;Bilateral upper extremity supported Sitting balance-Leahy Scale: Fair Sitting balance - Comments: Pt with fair sitting balance.   Standing balance  support: Bilateral upper extremity supported Standing balance-Leahy Scale: Fair Standing balance comment: Pt requires B UE support to remain upright.                             Pertinent Vitals/Pain Pain Assessment: 0-10 Pain Score: 9  Pain Location: back and in standing "all over" Pain Descriptors / Indicators: Aching;Sore;Grimacing Pain Intervention(s): Limited activity within patient's tolerance;Monitored during session;Repositioned    Home Living Family/patient expects to be discharged to:: Private residence Living Arrangements: Spouse/significant other Available Help at Discharge: Family;Available 24 hours/day Type of Home: House Home Access: Stairs to enter Entrance Stairs-Rails: Can reach both Entrance Stairs-Number of Steps: Front 4; Back 8   Home Equipment: Walker - 4 wheels;Cane - single point      Prior Function Level of Independence: Independent         Comments: Pt's wife is present and reports pt was able to do everyhting on his own including walking, bathing and doing things around the home.     Hand Dominance   Dominant Hand: Left    Extremity/Trunk Assessment   Upper Extremity Assessment Upper Extremity Assessment: Generalized weakness    Lower Extremity Assessment Lower Extremity Assessment: Generalized weakness       Communication   Communication: No difficulties  Cognition Arousal/Alertness: Awake/alert (some drowsiness throughout, possibly 2/2 pain medication) Behavior During Therapy: WFL for tasks assessed/performed Overall Cognitive Status: Difficult to assess                                 General Comments: Pt awake and fully concious.      General Comments      Exercises Total Joint Exercises Ankle Circles/Pumps: AROM;Strengthening;Both;10 reps;Supine Quad Sets: AROM;Strengthening;Both;10 reps;Supine Gluteal Sets: AROM;Strengthening;Both;10 reps;Supine Heel Slides: AROM;Strengthening;Both;10  reps;Supine Hip ABduction/ADduction: AROM;Strengthening;Both;10 reps;Supine Straight Leg Raises: AROM;Strengthening;Both;10 reps;Supine (Difficulty with performance on the R LE.) Marching in Standing: AROM;Strengthening;Both;10 reps;Supine Other Exercises Other Exercises: Pt and spouse educated on role of PT and services provided during hospital stay.  Pt also encouraged to not stay in bed due to pain and to move in order to prevent muscle atrophy and improve overall QoL.   Assessment/Plan    PT Assessment Patient needs continued PT services  PT Problem List Decreased strength;Decreased activity tolerance;Decreased balance;Decreased mobility;Decreased knowledge of use of DME;Decreased safety awareness;Pain       PT Treatment Interventions DME instruction;Gait training;Stair training;Functional mobility training;Therapeutic activities;Therapeutic exercise;Balance training;Neuromuscular re-education;Patient/family education    PT Goals (Current goals can be found in the Care Plan section)  Acute Rehab PT Goals Patient Stated Goal: to reduce pain and go home PT Goal Formulation: With patient Time For Goal Achievement: 02/01/21 Potential to Achieve Goals: Good    Frequency 7X/week   Barriers to discharge Inaccessible home environment      Co-evaluation               AM-PAC PT "6 Clicks" Mobility  Outcome Measure Help needed turning from your back to your side while in a flat bed without using bedrails?: A Little Help needed moving from lying on your back to sitting on the side of a flat  bed without using bedrails?: A Little Help needed moving to and from a bed to a chair (including a wheelchair)?: A Little Help needed standing up from a chair using your arms (e.g., wheelchair or bedside chair)?: A Little Help needed to walk in hospital room?: A Lot Help needed climbing 3-5 steps with a railing? : Total 6 Click Score: 15    End of Session Equipment Utilized During  Treatment: Gait belt;Back brace Activity Tolerance: Patient limited by fatigue Patient left: in chair;with call bell/phone within reach;with chair alarm set;with nursing/sitter in room;with family/visitor present Nurse Communication: Mobility status PT Visit Diagnosis: Unsteadiness on feet (R26.81);Other abnormalities of gait and mobility (R26.89);Muscle weakness (generalized) (M62.81);Difficulty in walking, not elsewhere classified (R26.2);Pain Pain - part of body:  (Back)    Time: 5051-8335 PT Time Calculation (min) (ACUTE ONLY): 41 min   Charges:   PT Evaluation $PT Eval Low Complexity: 1 Low PT Treatments $Gait Training: 23-37 mins $Therapeutic Exercise: 8-22 mins        Gwenlyn Saran, PT, DPT 01/18/21, 2:20 PM   Christie Nottingham 01/18/2021, 2:13 PM

## 2021-01-18 NOTE — Progress Notes (Addendum)
PROGRESS NOTE    Donald Lowery  YKD:[id number] DOB: Nov 10, 1950 DOA: 01/12/2021 PCP: Donald Lange, NP  Outpatient Specialists: oncology, cardiology    Brief Narrative:   Donald Lowery is a 70 y.o. 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  No falls, no trauma.  He was seen yesterday by his oncologist and had been on prednisone taper pack which helped relieve his pain.  He denies any chest pain shortness of breath cough or hemoptysis.  He denies any abdominal pain.  No nausea or vomiting, constipation or diarrhea.  No urinary symptoms.  EDP requested admission for symptoms management and pain control.   Assessment & Plan:   Active Problems:   Intractable back pain   Intractable pain   Pressure injury of skin  # Intractable pain secondary to bony metastases # Metastatic lung cancer. Followed by Dr. Grayland Ormond Getting radiation treatment today Continue fentanyl patch and Dilaudid 2 mg every 2 hour as needed for pain control Hopefully with kyphoplasty done yesterday his pain will be better controlled  # Dysphagia SLP consulted. Noted left vocal cord paralysis on recent PET scan, may contribute to dysphagia - dysphagia diet - consider outpt ENT f/u  #New closed wedge compression fracture of L3, pathological secondary to metastatic cancer S/p kyphoplasty by Dr. Rudene Christians on 9/20  # Hyponatremia Chronic, most likely siadh from pulmonary mets. Is at baseline and stable - monitor  # Hypokalemia Resolved  # Hyperkalemia Iatrogenic, resolved  # T2DM Consult diabetic coordinator  # CAD # Hx  NSTEMI Nstemi 2020, medical mgmt w/ diffuse disease on cath - cont home statin, zetia, imdur  # HTN/sinus tachycardia -We will add metoprolol 25 mg p.o. twice daily for better heart rate control Continue Imdur  # Malnutrition, severe - nutrition supplement, dietitian c/s  Constipation We will add Senokot as 2 tablets twice a day and continue MiraLAX   DVT prophylaxis: lovenox Code Status: full Family Communication: wife updated @ bedside 9/21  Level of care: Med-Surg Status is: inpt  The patient will require care spanning > 2 midnights and should be moved to inpatient because: Inpatient level of care appropriate due to severity of illness  Dispo:  Patient From: Home  Planned Disposition: home w/ home health versus SNF  Medically stable for discharge: No       Consultants:  Oncology notified  Procedures: Kyphoplasty on 9/20  Antimicrobials:  none    Subjective: Pain somewhat improved although patient is requesting pain medicine resident titrate the room.  Wife at bedside.  Wife requesting change his IV line in the hand and requesting PT to see him today.  No bowel movement for last 3 to 4 days  Objective: Vitals:   01/17/21 1728 01/18/21 0023 01/18/21 0509 01/18/21 0741  BP: (!) 153/83 127/63 114/69 123/68  Pulse: 90 (!) 102 (!) 114 (!) 115  Resp: 18 16 16 18   Temp: (!) 97.5 F (36.4 C) 98 F (36.7 C) 98 F (36.7 C) 97.9 F (36.6 C)  TempSrc: Oral   Oral  SpO2: 98% 97% 96% 96%  Weight:      Height:        Intake/Output  Summary (Last 24 hours) at 01/18/2021 1009 Last data filed at 01/17/2021 1624 Gross per 24 hour  Intake 1250 ml  Output 255 ml  Net 995 ml   Filed Weights   01/12/21 1519 01/13/21 2345  Weight: 53.3 kg 53 kg    Examination:  General exam: Appears calm and comfortable, in pain when moves.  Respiratory system: Clear to auscultation. Respiratory effort normal. Cardiovascular system: S1 & S2 heard, RRR. No JVD, murmurs, rubs,  gallops or clicks. No pedal edema. Gastrointestinal system: Abdomen is nondistended, soft and nontender. No organomegaly or masses felt. Normal bowel sounds heard. Central nervous system: Alert and oriented. No focal neurological deficits. Extremities: Symmetric 5 x 5 power. Skin: No rashes, lesions or ulcers Psychiatry: Judgement and insight appear normal. Mood & affect appropriate.     Data Reviewed: I have personally reviewed following labs and imaging studies  CBC: Recent Labs  Lab 01/12/21 1816 01/15/21 0554 01/16/21 0617  WBC 4.2 4.1 4.5  NEUTROABS 3.6  --   --   HGB 8.9* 9.0* 8.3*  HCT 25.9* 26.3* 24.0*  MCV 86.9 88.0 87.9  PLT 250 219 562   Basic Metabolic Panel: Recent Labs  Lab 01/14/21 0551 01/15/21 0554 01/15/21 0654 01/15/21 1711 01/16/21 0617 01/17/21 0436 01/18/21 0415  NA 129* 125* 125* 126* 127* 129* 128*  K 5.1 6.0* 5.6* 5.1 4.6 4.5 4.2  CL 94* 89* 91* 90* 93* 92* 92*  CO2 27 31 30 30 29 31 30   GLUCOSE 113* 181* 179* 234* 182* 171* 135*  BUN 18 17 17 17 15 16 13   CREATININE 0.58* 0.55* 0.50* 0.65 0.62 0.47* 0.53*  CALCIUM 8.1* 8.9 8.6* 8.6* 8.7* 8.8* 8.4*  MG 1.8 1.8  --   --  1.8 1.8 1.8   GFR: Estimated Creatinine Clearance: 63.5 mL/min (A) (by C-G formula based on SCr of 0.53 mg/dL (L)). Liver Function Tests: Recent Labs  Lab 01/12/21 1816  AST 19  ALT 20  ALKPHOS 134*  BILITOT 0.6  PROT 5.9*  ALBUMIN 2.6*   No results for input(s): LIPASE, AMYLASE in the last 168 hours. No results for input(s): AMMONIA in the last 168 hours. Coagulation Profile: No results for input(s): INR, PROTIME in the last 168 hours. Cardiac Enzymes: No results for input(s): CKTOTAL, CKMB, CKMBINDEX, TROPONINI in the last 168 hours. BNP (last 3 results) No results for input(s): PROBNP in the last 8760 hours. HbA1C: No results for input(s): HGBA1C in the last 72 hours.  CBG: Recent Labs  Lab 01/16/21 0612 01/17/21 0540 01/17/21 1456 01/17/21 1626  01/18/21 0743  GLUCAP 165* 149* 119* 128* 130*   Lipid Profile: No results for input(s): CHOL, HDL, LDLCALC, TRIG, CHOLHDL, LDLDIRECT in the last 72 hours. Thyroid Function Tests: No results for input(s): TSH, T4TOTAL, FREET4, T3FREE, THYROIDAB in the last 72 hours. Anemia Panel: No results for input(s): VITAMINB12, FOLATE, FERRITIN, TIBC, IRON, RETICCTPCT in the last 72 hours. Urine analysis: No results found for: COLORURINE, APPEARANCEUR, LABSPEC, PHURINE, GLUCOSEU, HGBUR, BILIRUBINUR, KETONESUR, PROTEINUR, UROBILINOGEN, NITRITE, LEUKOCYTESUR Sepsis Labs: @LABRCNTIP (procalcitonin:4,lacticidven:4)  ) Recent Results (from the past 240 hour(s))  Resp Panel by RT-PCR (Flu A&B, Covid) Nasopharyngeal Swab     Status: None   Collection Time: 01/12/21  6:16 PM   Specimen: Nasopharyngeal Swab; Nasopharyngeal(NP) swabs in vial transport medium  Result Value Ref Range Status   SARS Coronavirus 2 by RT PCR NEGATIVE NEGATIVE Final    Comment: (NOTE) SARS-CoV-2 target nucleic acids are NOT DETECTED.  The  SARS-CoV-2 RNA is generally detectable in upper respiratory specimens during the acute phase of infection. The lowest concentration of SARS-CoV-2 viral copies this assay can detect is 138 copies/mL. A negative result does not preclude SARS-Cov-2 infection and should not be used as the sole basis for treatment or other patient management decisions. A negative result may occur with  improper specimen collection/handling, submission of specimen other than nasopharyngeal swab, presence of viral mutation(s) within the areas targeted by this assay, and inadequate number of viral copies(<138 copies/mL). A negative result must be combined with clinical observations, patient history, and epidemiological information. The expected result is Negative.  Fact Sheet for Patients:  EntrepreneurPulse.com.au  Fact Sheet for Healthcare Providers:   IncredibleEmployment.be  This test is no t yet approved or cleared by the [place] FDA and  has been authorized for detection and/or diagnosis of SARS-CoV-2 by FDA under an Emergency Use Authorization (EUA). This EUA will remain  in effect (meaning this test can be used) for the duration of the COVID-19 declaration under Section 564(b)(1) of the Act, 21 U.S.C.section 360bbb-3(b)(1), unless the authorization is terminated  or revoked sooner.       Influenza A by PCR NEGATIVE NEGATIVE Final   Influenza B by PCR NEGATIVE NEGATIVE Final    Comment: (NOTE) The Xpert Xpress SARS-CoV-2/FLU/RSV plus assay is intended as an aid in the diagnosis of influenza from Nasopharyngeal swab specimens and should not be used as a sole basis for treatment. Nasal washings and aspirates are unacceptable for Xpert Xpress SARS-CoV-2/FLU/RSV testing.  Fact Sheet for Patients: EntrepreneurPulse.com.au  Fact Sheet for Healthcare Providers: IncredibleEmployment.be  This test is not yet approved or cleared by the [place] FDA and has been authorized for detection and/or diagnosis of SARS-CoV-2 by FDA under an Emergency Use Authorization (EUA). This EUA will remain in effect (meaning this test can be used) for the duration of the COVID-19 declaration under Section 564(b)(1) of the Act, 21 U.S.C. section 360bbb-3(b)(1), unless the authorization is terminated or revoked.  Performed at [facility], [address] [place], [place] [address]          Radiology Studies: DG Lumbar Spine 2-3 Views  Result Date: 01/17/2021 CLINICAL DATA:  Kyphoplasty. EXAM: DG C-ARM 1-60 MIN; LUMBAR SPINE - 2-3 VIEW FLUOROSCOPY TIME:  Fluoroscopy Time:  2 minutes and 2 seconds. Number of Acquired Spot Images: 7 COMPARISON:  MRI lumbar spine 01/13/2021. FINDINGS: Seven C-arm fluoroscopic images were obtained intraoperatively and submitted for post  operative interpretation. These images demonstrate kyphoplasty of the L3 vertebral body with slight lateral extravasation. Please see the performing provider's procedural report for further detail. IMPRESSION: Intraoperative fluoroscopy, as detailed above. Electronically Signed   By: Margaretha Sheffield M.D.   On: 01/17/2021 17:01   DG C-Arm 1-60 Min  Result Date: 01/17/2021 CLINICAL DATA:  Kyphoplasty. EXAM: DG C-ARM 1-60 MIN; LUMBAR SPINE - 2-3 VIEW FLUOROSCOPY TIME:  Fluoroscopy Time:  2 minutes and 2 seconds. Number of Acquired Spot Images: 7 COMPARISON:  MRI lumbar spine 01/13/2021. FINDINGS: Seven C-arm fluoroscopic images were obtained intraoperatively and submitted for post operative interpretation. These images demonstrate kyphoplasty of the L3 vertebral body with slight lateral extravasation. Please see the performing provider's procedural report for further detail. IMPRESSION: Intraoperative fluoroscopy, as detailed above. Electronically Signed   By: Margaretha Sheffield M.D.   On: 01/17/2021 17:01        Scheduled Meds:  vitamin C  500 mg Oral BID   aspirin EC  81  mg Oral Daily   atorvastatin  80 mg Oral q1800   enoxaparin (LOVENOX) injection  40 mg Subcutaneous Q24H   ezetimibe  10 mg Oral Daily   feeding supplement  237 mL Oral TID BM   fentaNYL  1 patch Transdermal Q72H   isosorbide mononitrate  30 mg Oral Daily   lidocaine  1 patch Transdermal Q12H   metFORMIN  500 mg Oral Q breakfast   metoprolol tartrate  25 mg Oral BID   multivitamin with minerals  1 tablet Oral Daily   polyethylene glycol  17 g Oral Daily   predniSONE  30 mg Oral Q breakfast   senna-docusate  2 tablet Oral BID   vitamin B-12  1,000 mcg Oral Daily   Continuous Infusions:       LOS: 5 days    Time spent: 20 min    Donald Sane, MD [facility]   If 7PM-7AM, please contact night-coverage [email or web address] Password [facility] 01/18/2021, 10:09 AM

## 2021-01-18 NOTE — Progress Notes (Signed)
SLP F/U Note  Patient Details Name: Donald Lowery MRN: [id number] DOB: 03-30-1951   Cancelled treatment:       Reason Eval/Treat Not Completed:  (chart reviewed; met w/ pt's Wife in room, then pt later on). Pt and Wife denied any swallowing issues w/ bites and sips at breakfast meal today. Pt ate a few things on the plate but mostly grits and chocolate milk. No overt coughing reported. Per MD's note, lungs are "Clear to auscultation. Respiratory effort normal". Pt's pain is mildly improved per his report.  Briefly discussed general aspiration precautions when eating/drinking; encouraged having staff help support sitting fully Upright in bed for safer oral intake. Recommended Pills in Puree as needed for safer swallowing. Aspiration precautions left in room.  MD to reconsult if new needs arise while admitted. Encouraged pt and Wife to f/u w/ ENT d/t the Left VC weakness and to also monitor for any decline in pulmonary status or difficulty swallowing and f/u w/ PCP for an Outpatient MBSS if indicated. Pt and Wife agreed.        Orinda Kenner, MS, CCC-SLP Speech Language Pathologist Rehab Services [phone] [facility] 01/18/2021, 3:05 PM

## 2021-01-18 NOTE — Progress Notes (Signed)
Nutrition Follow Up Note   DOCUMENTATION CODES:   Severe malnutrition in context of chronic illness  INTERVENTION:   Ensure Enlive po TID, each supplement provides 350 kcal and 20 grams of protein  Add Magic cup TID with meals, each supplement provides 290 kcal and 9 grams of protein  MVI po daily   Vitamin C $RemoveB'500mg'[id number]$  po BID  Pt at high refeed risk; recommend monitor potassium, magnesium and phosphorus labs daily until stable  NUTRITION DIAGNOSIS:   Severe Malnutrition related to cancer and cancer related treatments as evidenced by severe fat depletion, severe muscle depletion. -new diagnosis   GOAL:   Patient will meet greater than or equal to 90% of their needs -progressing   MONITOR:   PO intake, Supplement acceptance, Labs, Weight trends, Skin, I & O's  ASSESSMENT:   70 y/o male with h/o metastatic NSCLC, CVA, HLD, HTN, DM and NSTEMI who is admitted with intractable lower back pain and found to have L3 compression fracture secondary to metastasis now s/p kyphoplasty and new XRT 9/20  Met with pt and pt's wife in room today. Pt reports that he is feeling slightly less painful today. Pt with poor oral intake; pt eating 20-50% of meals and is drinking some of the Ensure supplements. Pt ate a cheese quesadilla, a few bites of mashed potatoes, a few bites of corn, 50% of an ice cream and drank 3/4 of an Ensure for lunch today. Pt asking for more ice cream at time of RD visit. RD will add Magic Cups to meals trays. Discussed with pt the importance of adequate nutrition needed to preserve lean muscle. Encouraged pt to try and drink 3 Ensure per day and to eat the Boutte. Pt remains at high refeed risk. No new weight since 9/16; RD will request daily weights for trending.   Medications reviewed and include: vitamin C, aspirin, lovenox, metformin, MVI, miralax, prednisone, senokot, B12  Labs reviewed: Na 128(L), K 4.2 wnl, creat 0.53(L), Mg 1.8 wnl Hgb 8.3(L), Hct  24.0(L)  Nutrition Focused Physical Exam:  Flowsheet Row Most Recent Value  Orbital Region Moderate depletion  Upper Arm Region Moderate depletion  Thoracic and Lumbar Region Severe depletion  Buccal Region Mild depletion  Temple Region Severe depletion  Clavicle Bone Region Severe depletion  Clavicle and Acromion Bone Region Severe depletion  Scapular Bone Region Severe depletion  Dorsal Hand Moderate depletion  Patellar Region Severe depletion  Anterior Thigh Region Severe depletion  Posterior Calf Region Severe depletion  Edema (RD Assessment) None  Hair Reviewed  Eyes Reviewed  Mouth Reviewed  Skin Reviewed  Nails Reviewed   Diet Order:   Diet Order             Diet regular Room service appropriate? Yes; Fluid consistency: Thin  Diet effective now                  EDUCATION NEEDS:   Education needs have been addressed  Skin:  Skin Assessment: Reviewed RN Assessment (Stage II sacrum)  Last BM:  9/16  Height:   Ht Readings from Last 1 Encounters:  01/13/21 $RemoveB'5\' 4"'HXjAeevh$  (1.626 m)    Weight:   Wt Readings from Last 1 Encounters:  01/13/21 53 kg    Ideal Body Weight:  59 kg  BMI:  Body mass index is 20.06 kg/m.  Estimated Nutritional Needs:   Kcal:  1700-2000kcal/day  Protein:  85-100g/day  Fluid:  1.4-1.6L/day  Koleen Distance MS, RD, LDN Please refer to  AMION for RD and/or RD on-call/weekend/after hours pager

## 2021-01-18 NOTE — Plan of Care (Signed)
  Problem: Education: Goal: Knowledge of General Education information will improve Description: Including pain rating scale, medication(s)/side effects and non-pharmacologic comfort measures 01/18/2021 1452 by Emmaline Life, RN Outcome: Progressing 01/18/2021 1451 by Emmaline Life, RN Outcome: Progressing

## 2021-01-18 NOTE — Plan of Care (Signed)

## 2021-01-18 NOTE — Plan of Care (Signed)
  Problem: Education: Goal: Knowledge of General Education information will improve Description Including pain rating scale, medication(s)/side effects and non-pharmacologic comfort measures Outcome: Progressing   

## 2021-01-19 ENCOUNTER — Ambulatory Visit: Payer: Medicare HMO

## 2021-01-19 DIAGNOSIS — M5459 Other low back pain: Secondary | ICD-10-CM

## 2021-01-19 DIAGNOSIS — M5442 Lumbago with sciatica, left side: Secondary | ICD-10-CM

## 2021-01-19 DIAGNOSIS — Z515 Encounter for palliative care: Secondary | ICD-10-CM

## 2021-01-19 DIAGNOSIS — M5441 Lumbago with sciatica, right side: Secondary | ICD-10-CM

## 2021-01-19 DIAGNOSIS — Z419 Encounter for procedure for purposes other than remedying health state, unspecified: Secondary | ICD-10-CM

## 2021-01-19 LAB — MAGNESIUM: Magnesium: 1.8 mg/dL (ref 1.7–2.4)

## 2021-01-19 LAB — BASIC METABOLIC PANEL
Anion gap: 7 (ref 5–15)
BUN: 15 mg/dL (ref 8–23)
CO2: 30 mmol/L (ref 22–32)
Calcium: 9 mg/dL (ref 8.9–10.3)
Chloride: 91 mmol/L — ABNORMAL LOW (ref 98–111)
Creatinine, Ser: 0.57 mg/dL — ABNORMAL LOW (ref 0.61–1.24)
GFR, Estimated: >60 mL/min (ref >60)
Glucose, Bld: 203 mg/dL — ABNORMAL HIGH (ref 70–99)
Potassium: 4.4 mmol/L (ref 3.5–5.1)
Sodium: 128 mmol/L — ABNORMAL LOW (ref 135–145)

## 2021-01-19 LAB — SURGICAL PATHOLOGY

## 2021-01-19 MED ORDER — NALOXONE HCL 2 MG/2ML IJ SOSY: 0.4000 mg | PREFILLED_SYRINGE | INTRAMUSCULAR | 0 refills | Status: AC | PRN

## 2021-01-19 MED ORDER — METOPROLOL TARTRATE 25 MG PO TABS: 25.0000 mg | ORAL_TABLET | Freq: Two times a day (BID) | ORAL | 0 refills | Status: AC

## 2021-01-19 MED ORDER — FENTANYL 50 MCG/HR TD PT72: 1.0000 | MEDICATED_PATCH | TRANSDERMAL | 0 refills | Status: DC

## 2021-01-19 MED ORDER — ENSURE ENLIVE PO LIQD: 237.0000 mL | Freq: Three times a day (TID) | ORAL | 12 refills | Status: AC

## 2021-01-19 MED ORDER — SENNOSIDES-DOCUSATE SODIUM 8.6-50 MG PO TABS: 2.0000 | ORAL_TABLET | Freq: Every evening | ORAL | 0 refills | Status: AC | PRN

## 2021-01-19 MED ORDER — GABAPENTIN 100 MG PO CAPS: 100.0000 mg | ORAL_CAPSULE | Freq: Three times a day (TID) | ORAL | 0 refills | Status: DC

## 2021-01-19 NOTE — Progress Notes (Signed)
Mobility Specialist - Progress Note   01/19/21 0900  Mobility  Activity Transferred:  Bed to chair;Ambulated in room  Level of Assistance Minimal assist, patient does 75% or more  Assistive Device Other (Comment) (2 HHA)  Distance Ambulated (ft) 4 ft  Mobility Out of bed to chair with meals;Ambulated with assistance in room  Mobility Response Tolerated well  Mobility performed by Mobility specialist  $Mobility charge 1 Mobility    Pt ambulated bed-wheelchair prior to transfer per pt request. 2 HHA, no LOB. Pt motivated to ambulate later this afternoon.    Kathee Delton Mobility Specialist 01/19/21, 9:37 AM

## 2021-01-19 NOTE — Consult Note (Signed)
Donald Lowery  [phone] [phone]   Name: Donald Lowery Date: 01/19/2021 MRN: [id number]  DOB: 09/03/1950  Patient Care Team: [name], NP as PCP - General (Internal Medicine) [name], MD as PCP - Cardiology (Cardiology) Telford Nab, RN as Oncology Nurse Navigator    REASON FOR CONSULTATION: Donald Lowery is a 70 y.o. 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  Patient was admitted to hospital 01/12/2021 with intractable low back pain.  MRI of the lumbar spine revealed L3 pathologic compression fracture.  He underwent kyphoplasty and is started on XRT to this lesion.  Patient was referred to palliative care to help address goals to manage ongoing symptoms.  SOCIAL HISTORY:     reports that he quit smoking about 2 years ago. His smoking use included cigarettes. He has a 45.00 pack-year smoking history. He has never used smokeless tobacco. He reports that he does not drink alcohol and does not use drugs.  Patient is married and lives at home with his wife.  He had a son who is now deceased.  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  ADVANCE DIRECTIVES:  Does not have  CODE STATUS: Full code  PAST MEDICAL HISTORY: Past Medical History:  Diagnosis Date   Aortic atherosclerosis ([place])    [place] hernia 09/21/2020   fatty   Coronary artery disease    a. 04/2018 NSTEMI/Cath: LM nl, LAD 50p, 3m/d diffuse-small caliber, LCX heavily Ca2+ sev prox/mid dzs, RCA 90 diff distal dzs into RPL and RPDA. EF 50-55%-->Med Rx.   Hepatic steatosis    History of 2019 novel coronavirus disease (COVID-19) 10/12/2020   History of echocardiogram    a. 04/2018 Echo: EF 55-60%, no rwma, mild MR, mildly dil LA. Nl RV fxn.   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 Quit 04/2018.    PAST SURGICAL HISTORY:  Past Surgical History:  Procedure Laterality Date   BRAIN SURGERY     CARDIAC CATHETERIZATION     IR IMAGING GUIDED PORT INSERTION  10/28/2020   KYPHOPLASTY N/A 01/17/2021   Procedure: Claybon Jabs RFA;  Surgeon: Hessie Knows, MD;  Location: [facility];  Service: Orthopedics;  Laterality: N/A;   LEFT HEART CATH AND CORONARY ANGIOGRAPHY N/A 05/16/2018   Procedure: LEFT HEART CATH AND CORONARY ANGIOGRAPHY poss PCI;  Surgeon: [name], MD;  Location: [place] [facility];  Service: Cardiovascular;  Laterality: N/A;   VIDEO BRONCHOSCOPY WITH ENDOBRONCHIAL NAVIGATION N/A 10/24/2020   Procedure: ROBOTIC ASSISTED VIDEO BRONCHOSCOPY WITH ENDOBRONCHIAL NAVIGATION;  Surgeon: Tyler Pita, MD;  Location: [facility];  Service: Pulmonary;  Laterality: N/A;    HEMATOLOGY/ONCOLOGY HISTORY:  Oncology History  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[place])  09/21/2020 Initial Diagnosis   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[place])   10/28/2020 Cancer Staging   Staging form: Lung, AJCC 8th Edition - Clinical stage from 10/28/2020: Stage IVA (cT4, cN0, pM1a) - Signed by Lloyd Huger, MD on 10/28/2020   11/03/2020 - 01/11/2021 Chemotherapy  02/01/2021 -  Chemotherapy    Patient is on Treatment Plan: LUNG NSCLC FLAT DOSE PEMBROLIZUMAB Q21D         ALLERGIES:  has No Known Allergies.  MEDICATIONS:  Current Facility-Administered Medications  Medication Dose Route Frequency Provider Last Rate Last Admin   acetaminophen (TYLENOL) tablet 650 mg  650 mg Oral Q6H PRN Hessie Knows, MD       ascorbic acid (VITAMIN C) tablet 500 mg  500 mg Oral BID Hessie Knows, MD   500 mg at 01/19/21  0908   aspirin EC tablet 81 mg  81 mg Oral Daily Hessie Knows, MD   81 mg at 01/19/21 0908   atorvastatin (LIPITOR) tablet 80 mg  80 mg Oral q1800 Hessie Knows, MD   80 mg at 01/18/21 1842   cyclobenzaprine (FLEXERIL) tablet 5 mg  5 mg Oral TID PRN Hessie Knows, MD   5 mg at 01/18/21 2322   enoxaparin (LOVENOX) injection 40 mg  40 mg Subcutaneous Q24H Hessie Knows, MD   40 mg at 01/19/21 8299   ezetimibe (ZETIA) tablet 10 mg  10 mg Oral Daily Hessie Knows, MD   10 mg at 01/19/21 3716   feeding supplement (ENSURE ENLIVE / ENSURE PLUS) liquid 237 mL  237 mL Oral TID BM Hessie Knows, MD   237 mL at 01/18/21 2018   fentaNYL (DURAGESIC) 50 MCG/HR 1 patch  1 patch Transdermal Q72H Max Sane, MD       gabapentin (NEURONTIN) capsule 100 mg  100 mg Oral TID Max Sane, MD   100 mg at 01/19/21 9678   HYDROmorphone (DILAUDID) injection 2 mg  2 mg Intravenous Q2H PRN Max Sane, MD   2 mg at 01/19/21 0743   isosorbide mononitrate (IMDUR) 24 hr tablet 30 mg  30 mg Oral Daily Hessie Knows, MD   30 mg at 01/19/21 0908   lidocaine (LIDODERM) 5 % 1 patch  1 patch Transdermal Q12H Hessie Knows, MD   1 patch at 01/18/21 2101   lidocaine (XYLOCAINE) 2 % viscous mouth solution 15 mL  15 mL Mouth/Throat Q6H PRN Hessie Knows, MD   15 mL at 01/16/21 1225   melatonin tablet 2.5 mg  2.5 mg Oral QHS PRN Renda Rolls, RPH       metFORMIN (GLUCOPHAGE) tablet 500 mg  500 mg Oral Q breakfast Hessie Knows, MD   500 mg at 01/19/21 0908   metoprolol tartrate (LOPRESSOR) tablet 25 mg  25 mg Oral BID Max Sane, MD   25 mg at 01/19/21 0908   multivitamin with minerals tablet 1 tablet  1 tablet Oral Daily Hessie Knows, MD   1 tablet at 01/19/21 9381   naloxone [facility]) injection 0.4 mg  0.4 mg Intravenous PRN Hessie Knows, MD       ondansetron [facility]) injection 4 mg  4 mg Intravenous Q6H PRN Hessie Knows, MD   4 mg at 01/13/21 2016   oxyCODONE (Oxy IR/ROXICODONE) immediate release tablet 10 mg  10 mg Oral Q4H  PRN Max Sane, MD       polyethylene glycol (MIRALAX / GLYCOLAX) packet 17 g  17 g Oral Daily Hessie Knows, MD   17 g at 01/19/21 0175   predniSONE (DELTASONE) tablet 30 mg  30 mg Oral Q breakfast Hessie Knows, MD   30 mg at 01/19/21 0908   senna-docusate (Senokot-S) tablet 2 tablet  2 tablet Oral BID Max Sane, MD   2 tablet at 01/19/21 1025   vitamin  B-12 (CYANOCOBALAMIN) tablet 1,000 mcg  1,000 mcg Oral Daily Hessie Knows, MD   1,000 mcg at 01/19/21 0907    VITAL SIGNS: BP 127/69 (BP Location: Left Arm)   Pulse 74   Temp 98.2 F (36.8 C) (Oral)   Resp 16   Ht 5\' 4"  (1.626 m)   Wt 116 lb 13.5 oz (53 kg)   SpO2 97%   BMI 20.06 kg/m  Filed Weights   01/12/21 1519 01/13/21 2345  Weight: 117 lb 6.4 oz (53.3 kg) 116 lb 13.5 oz (53 kg)    Estimated body mass index is 20.06 kg/m as calculated from the following:   Height as of this encounter: 5\' 4"  (1.626 m).   Weight as of this encounter: 116 lb 13.5 oz (53 kg).  LABS: CBC:    Component Value Date/Time   WBC 4.5 01/16/2021 0617   HGB 8.3 (L) 01/16/2021 0617   HCT 24.0 (L) 01/16/2021 0617   PLT 223 01/16/2021 0617   MCV 87.9 01/16/2021 0617   NEUTROABS 3.6 01/12/2021 1816   LYMPHSABS 0.3 (L) 01/12/2021 1816   MONOABS 0.2 01/12/2021 1816   EOSABS 0.0 01/12/2021 1816   BASOSABS 0.0 01/12/2021 1816   Comprehensive Metabolic Panel:    Component Value Date/Time   NA 128 (L) 01/19/2021 0444   NA 138 12/16/2018 0933   K 4.4 01/19/2021 0444   CL 91 (L) 01/19/2021 0444   CO2 30 01/19/2021 0444   BUN 15 01/19/2021 0444   BUN 14 12/16/2018 0933   CREATININE 0.57 (L) 01/19/2021 0444   GLUCOSE 203 (H) 01/19/2021 0444   CALCIUM 9.0 01/19/2021 0444   AST 19 01/12/2021 1816   ALT 20 01/12/2021 1816   ALKPHOS 134 (H) 01/12/2021 1816   BILITOT 0.6 01/12/2021 1816   PROT 5.9 (L) 01/12/2021 1816   ALBUMIN 2.6 (L) 01/12/2021 1816    RADIOGRAPHIC STUDIES: DG Lumbar Spine 2-3 Views  Result Date: 01/17/2021 CLINICAL DATA:   Kyphoplasty. EXAM: DG C-ARM 1-60 MIN; LUMBAR SPINE - 2-3 VIEW FLUOROSCOPY TIME:  Fluoroscopy Time:  2 minutes and 2 seconds. Number of Acquired Spot Images: 7 COMPARISON:  MRI lumbar spine 01/13/2021. FINDINGS: Seven C-arm fluoroscopic images were obtained intraoperatively and submitted for post operative interpretation. These images demonstrate kyphoplasty of the L3 vertebral body with slight lateral extravasation. Please see the performing provider's procedural report for further detail. IMPRESSION: Intraoperative fluoroscopy, as detailed above. Electronically Signed   By: Margaretha Sheffield M.D.   On: 01/17/2021 17:01   DG Lumbar Spine 2-3 Views  Result Date: 01/12/2021 CLINICAL DATA:  Back pain, lung cancer, evaluate for metastases EXAM: LUMBAR SPINE - 2-3 VIEW COMPARISON:  01/03/2021 FINDINGS: Mild superior endplate compression fracture deformity at L3, with 15% loss of height, new from the prior. This reflects a pathologic fracture. Mild degenerative changes of the visualized thoracolumbar spine. Visualized bony pelvis appears intact. IMPRESSION: Mild superior endplate compression fracture deformity at L3, with 15% loss of height, new from the prior. This reflects a pathologic fracture. Electronically Signed   By: Julian Hy M.D.   On: 01/12/2021 20:04   DG Lumbar Spine Complete  Result Date: 01/04/2021 CLINICAL DATA:  Back pain EXAM: LUMBAR SPINE - COMPLETE 4+ VIEW COMPARISON:  None. FINDINGS: Vertebral body heights are well-maintained. Levocurvature of the lumbar spine with rotatory component. No evidence of fracture. Minimal retrolisthesis of L1 on L2 and L3 and L4. Multilevel degenerative disc disease with mild-to-moderate osteophyte formation and moderate disc space height loss at  L3-L4. Mild multilevel facet arthropathy. Aortic vascular calcifications. IMPRESSION: No acute osseous abnormality. Mild-to-moderate multilevel degenerative disc disease and mild facet arthropathy. Electronically  Signed   By: Yetta Glassman M.D.   On: 01/04/2021 10:24   MR Lumbar Spine W Wo Contrast  Result Date: 01/13/2021 CLINICAL DATA:  Spine fracture, lumbosacral, pathological new pathologic compression fracture lumbar spine EXAM: MRI LUMBAR SPINE WITHOUT AND WITH CONTRAST TECHNIQUE: Multiplanar and multiecho pulse sequences of the lumbar spine were obtained without and with intravenous contrast. CONTRAST:  51mL GADAVIST GADOBUTROL 1 MMOL/ML IV SOLN COMPARISON:  PET-CT 12/12/2020.  Lumbar radiographs 01/12/2021. FINDINGS: Segmentation: Standard segmentation is assumed. The inferior-most fully formed intervertebral disc is labeled L5-S1. Alignment:  Substantial sagittal subluxation.  Levocurvature. Vertebrae: Abnormal enhancement, T1 hypointensity and STIR hyperintensity involving the entire L3 vertebral body and bilateral L3 pedicles, compatible with metastasis. Pathologic fracture with 50% vertebral body height loss. Extraosseous extension of tumor with right anterior paraspinal soft tissue mass measuring 4.2 cm with surrounding edema/enhancement. There also is posterior epidural extension of enhancing tumor into the canal and right foramen with associated severe canal stenosis and moderate to severe right foraminal stenosis. Conus medullaris and cauda equina: Conus extends to the L1 level. Conus appears normal Paraspinal and other soft tissues: Right anterolateral paraspinal soft tissue mass at L3 with extensive surrounding edema and enhancement, as detailed above. Disc levels: T12-L1: No significant disc protrusion, foraminal stenosis, or canal stenosis. L1-L2: Mild disc bulging without significant canal or foraminal stenosis. L2-L3: Mild disc bulging without significant canal or foraminal stenosis. L3-L4: Pathologic fracture at L3 with posterior epidural extension of tumor into the canal and right foramen, as described above. Resulting severe canal stenosis and moderate to severe right foraminal stenosis. L4-L5:  Small broad disc bulge and bilateral facet hypertrophy with mild right foraminal stenosis. No significant canal or left foraminal stenosis. L5-S1: Facet hypertrophy without significant canal or foraminal stenosis. Other: Please see MRI of the pelvis for evaluation of the pelvis/sacrum. IMPRESSION: 1. Osseous metastatic disease at L3 with pathologic fracture (50% height loss) and posterior epidural extension of tumor into the canal and right foramen. Resulting severe canal stenosis and moderate to severe right foraminal stenosis. Associated right anterolateral 4.2 cm paraspinal soft tissue mass at L3 with surrounding inflammation/edema. 2. No evidence of osseous metastatic disease in the other lumbar vertebral bodies. Please see concurrent MRI of the pelvis for evaluation of the pelvis/sacrum. Electronically Signed   By: Margaretha Sheffield M.D.   On: 01/13/2021 12:13   MR PELVIS W WO CONTRAST  Result Date: 01/13/2021 CLINICAL DATA:  70 y.o. 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 CONTRAST:  60mL GADAVIST GADOBUTROL 1 MMOL/ML IV SOLN COMPARISON:  PET-CT 12/12/2020 FINDINGS: Bones: No hip fracture, dislocation or avascular necrosis. 3.3 x 3.8 cm soft tissue mass with bone destruction involving the left iliac crest with surrounding bone marrow edema. Mild edema in the adjacent iliacus muscle which may related to radiation changes. 12 mm focus of osseous metastatic disease in the right iliac crest without a soft tissue component. 5 mm osseous metastatic disease in the S1 vertebral body. Normal sacrum and sacroiliac joints. No SI joint widening or erosive changes. Articular  cartilage and  labrum Articular cartilage:  No chondral defect. Labrum: Grossly intact, but evaluation is limited by lack of intraarticular fluid. Joint or bursal effusion Joint effusion:  No hip joint effusion.  No SI joint effusion. Bursae:  No bursa formation. Muscles and tendons Flexors: Mild perifascial edema involving the iliacus muscles bilaterally. Extensors: Normal. Abductors: Normal. Adductors: Normal. Gluteals: Normal. Hamstrings: Normal. Other findings No pelvic free fluid. No fluid collection or hematoma. No inguinal lymphadenopathy. No inguinal hernia. IMPRESSION: 1. Osseous metastatic disease involving the left iliac crest with a large soft tissue component measuring 3.3 x 3.8 cm and surrounding bone marrow edema. Mild edema in the adjacent iliacus muscle which may related to radiation changes. 12 mm focus of osseous metastatic disease in the right iliac crest. 5 mm osseous metastatic disease in the S1 vertebral body. 2. No hip fracture, dislocation or avascular necrosis. Electronically Signed   By: Kathreen Devoid M.D.   On: 01/13/2021 13:18   DG C-Arm 1-60 Min  Result Date: 01/17/2021 CLINICAL DATA:  Kyphoplasty. EXAM: DG C-ARM 1-60 MIN; LUMBAR SPINE - 2-3 VIEW FLUOROSCOPY TIME:  Fluoroscopy Time:  2 minutes and 2 seconds. Number of Acquired Spot Images: 7 COMPARISON:  MRI lumbar spine 01/13/2021. FINDINGS: Seven C-arm fluoroscopic images were obtained intraoperatively and submitted for post operative interpretation. These images demonstrate kyphoplasty of the L3 vertebral body with slight lateral extravasation. Please see the performing provider's procedural report for further detail. IMPRESSION: Intraoperative fluoroscopy, as detailed above. Electronically Signed   By: Margaretha Sheffield M.D.   On: 01/17/2021 17:01    PERFORMANCE STATUS (ECOG) : 2 - Symptomatic, <50% confined to bed  Review of Systems Unless otherwise noted, a complete review of systems is negative.  Physical Exam General:  NAD Pulmonary: Unlabored Extremities: no edema, no joint deformities Skin: no rashes Neurological: Weakness but otherwise nonfocal  IMPRESSION: Patient well-known to me from the clinic.  Today, patient reports that he is feeling much better.  His pain is currently improved.  He says that he is interested in ambulating some today with physical therapy.  Patient has continued to require frequent dosing of IV hydromorphone.  Transdermal fentanyl was increased to 50 mcg.   Patient has had periods of poorly controlled pain in the outpatient setting.  He is also been somewhat noncompliant in the past with oral medications. Agree with use of transdermal fentanyl. However, would also recommend maximizing use of oxycodone (instead of IV hydromorphone) in anticipation of future discharge.   Patient did have small bowel movement this morning.  Patient's goals are aligned with current scope of treatment.  We will plan follow-up in the cancer center following discharge.  Patient does not have advance directives but is interested in establishing those.  We will consult the chaplain to provide patient with documentation.  PLAN: -Continue current scope of treatment -Agree with increased dose of transdermal fentanyl -Continue oxycodone as needed for breakthrough pain -Continue daily bowel regimen -Chaplain consult for ACP -Will plan follow up in the clinic  Case and plan discussed with Dr. Grayland Ormond  Time Total: 60 minutes  Visit consisted of counseling and education dealing with the complex and emotionally intense issues of symptom management and palliative care in the setting of serious and potentially life-threatening illness.Greater than 50%  of this time was spent counseling and coordinating care related to the above assessment and plan.  Signed by: Altha Harm, PhD, NP-C

## 2021-01-19 NOTE — Discharge Summary (Signed)
[facility] NAME: Donald Lowery    MR#:  [id number]  DATE OF BIRTH:  12/11/1950  DATE OF ADMISSION:  01/12/2021   ADMITTING PHYSICIAN: Gwynne Edinger, MD  DATE OF DISCHARGE: 01/19/2021  2:49 PM  PRIMARY CARE PHYSICIAN: [name], NP   ADMISSION DIAGNOSIS:  Intractable pain [R52] Intractable back pain [M54.9] Intractable low back pain [M54.59] Bilateral low back pain with bilateral sciatica, unspecified chronicity [M54.42, M54.41] DISCHARGE DIAGNOSIS:  Active Problems:   Intractable back pain   Intractable pain   Pressure injury of skin   Protein-calorie malnutrition, severe   Bilateral low back pain with bilateral sciatica   Surgery, elective   Intractable low back pain  SECONDARY DIAGNOSIS:   Past Medical History:  Diagnosis Date  . Aortic atherosclerosis ([place])   . Bochdalek hernia 09/21/2020   fatty  . Coronary artery disease    a. 04/2018 NSTEMI/Cath: LM nl, LAD 50p, 36md diffuse-small caliber, LCX heavily Ca2+ sev prox/mid dzs, RCA 90 diff distal dzs into RPL and RPDA. EF 50-55%-->Med Rx.  . Hepatic steatosis   . History of 2019 novel coronavirus disease (COVID-19) 10/12/2020  . History of echocardiogram    a. 04/2018 Echo: EF 55-60%, no rwma, mild MR, mildly dil LA. Nl RV fxn.  . Hyperlipidemia   . Hypertension   . NSTEMI (non-ST elevated myocardial infarction) ([facility] 05/15/2018  . 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[facility] [place] 09/21/2020   a.) 7 cm LUL mass with left subclavian/proximal vertebral encasement  . Paraseptal emphysema ([facility]   . T2DM (type 2 diabetes mellitus) ([facility]   . Tobacco abuse    a. Quit 04/2018.   HOSPITAL COURSE:  70y.o. 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 Outpatient follow-up with Dr. FGrayland Ormondas scheduled Received radiation treatment while in the hospital S/p kyphoplasty and adjustment with his pain regimen as recommended by palliative care and pharmacy   # Dysphagia Patient and wife declined we will dysphagia issue.  Aspiration precautions and pills in pure requested per speech therapy   #New closed wedge compression fracture of L3, pathological secondary to metastatic cancer S/p kyphoplasty by Dr. MRudene Christianson 9/20 Outpatient follow-up with Dr. MRudene Christians  # Hyponatremia Chronic, most likely siadh from pulmonary mets. Is at baseline and stable  # Hypokalemia Repleted and resolved   # Hyperkalemia Iatrogenic, resolved   # T2DM Controlled   # CAD # Hx NSTEMI Nstemi 2020, medical mgmt w/ diffuse disease on cath - cont home statin, zetia, imdur   # HTN/sinus tachycardia -Added metoprolol 25 mg p.o. twice daily for better heart rate control Continue Imdur   # Malnutrition, severe -Nutritional supplement added per dietitian recommendation   Constipation Resolved with bowel regimen   Sacral stage II pressure ulcer as below: Recommend foam and barrier cream dressing daily by home health nurse  Pressure Injury 01/13/21 Sacrum Stage 2 -  Partial thickness loss of dermis presenting as a shallow open injury with a red, pink  wound bed without slough. (Active)  01/13/21 2300  Location: Sacrum  Location Orientation:   Staging: Stage 2 -  Partial thickness loss of dermis presenting as a shallow open injury with a red, pink wound bed without slough.  Wound Description (Comments):   Present on Admission: Yes   DISCHARGE CONDITIONS:  Stable CONSULTS  OBTAINED:  Treatment Team:  Hessie Knows, MD DRUG ALLERGIES:  No Known Allergies DISCHARGE MEDICATIONS:   Allergies as of 01/19/2021   No Known Allergies      Medication List     STOP taking these medications    lidocaine 5 % Commonly known as: Lidoderm   losartan 25 MG tablet Commonly known as: COZAAR   morphine 15 MG 12 hr tablet Commonly known as: MS CONTIN   naloxone 4 MG/0.1ML Liqd nasal spray kit Commonly known as: NARCAN Replaced by: naloxone 2 MG/2ML injection   nitroGLYCERIN 0.4 MG SL tablet Commonly known as: NITROSTAT   predniSONE 10 MG (21) Tbpk tablet Commonly known as: STERAPRED UNI-PAK 21 TAB       TAKE these medications    aspirin 81 MG EC tablet Take 1 tablet (81 mg total) by mouth daily.   atorvastatin 80 MG tablet Commonly known as: LIPITOR Take 1 tablet (80 mg total) by mouth daily at 6 PM.   cyclobenzaprine 5 MG tablet Commonly known as: FLEXERIL Take 1 tablet (5 mg total) by mouth 3 (three) times daily as needed for muscle spasms.   dicyclomine 10 MG capsule Commonly known as: Bentyl Take 1 capsule (10 mg total) by mouth 3 (three) times daily as needed for up to 14 days for spasms.   ezetimibe 10 MG tablet Commonly known as: ZETIA Take 1 tablet (10 mg total) by mouth daily.   feeding supplement Liqd Take 237 mLs by mouth 3 (three) times daily between meals.   fentaNYL 50 MCG/HR Commonly known as: [place] 1 patch onto the skin every 3 (three) days for 3 days.   gabapentin 100 MG capsule Commonly known as: NEURONTIN Take 1 capsule (100 mg total) by mouth 3 (three) times daily.   icosapent Ethyl 1 g capsule Commonly known as: Vascepa Take 2 capsules (2 g total) by mouth 2 (two) times daily.   Icy Hot 10-30 % Stck Apply 1 application topically daily as needed (shoulder pain).   isosorbide mononitrate 30 MG 24 hr tablet Commonly known as: IMDUR Take 1 tablet (30 mg total) by mouth daily.   metFORMIN 500 MG  tablet Commonly known as: GLUCOPHAGE Take 500 mg by mouth every morning.   metoprolol tartrate 25 MG tablet Commonly known as: LOPRESSOR Take 1 tablet (25 mg total) by mouth 2 (two) times daily.   naloxone 2 MG/2ML injection Commonly known as: NARCAN Inject 0.4 mLs (0.4 mg total) into the vein as needed. Replaces: naloxone 4 MG/0.1ML Liqd nasal spray kit   Oxycodone HCl 10 MG Tabs Take 1 tablet (10 mg total) by mouth every 4 (four) hours as needed.   senna-docusate 8.6-50 MG tablet Commonly known as: Senokot-S Take 2 tablets by mouth at bedtime as needed for mild constipation.   sucralfate 1 g tablet Commonly known as: Carafate Take 0.5 tablets (0.5 g total) by mouth 3 (three) times daily before meals.   vitamin B-12 1000 MCG tablet Commonly known as: CYANOCOBALAMIN Take 1,000 mcg by mouth daily.               Durable Medical Equipment  (From admission, onward)  Start     Ordered   01/19/21 0955  For home use only DME Bedside commode  Once       Question:  Patient needs a bedside commode to treat with the following condition  Answer:  Impaired ambulation   01/19/21 0955   01/19/21 0954  DME Walker  Once       Question Answer Comment  Walker: With 5 Inch Wheels   Patient needs a walker to treat with the following condition Impaired ambulation      01/19/21 0955           DISCHARGE INSTRUCTIONS:   DIET:  Diabetic diet DISCHARGE CONDITION:  Fair ACTIVITY:  Activity as tolerated OXYGEN:  Home Oxygen: No.  Oxygen Delivery: room air DISCHARGE LOCATION:  home with home health PT OT RN and nursing aide.  Palliative care to follow as an outpatient  If you experience worsening of your admission symptoms, develop shortness of breath, life threatening emergency, suicidal or homicidal thoughts you must seek medical attention immediately by calling 911 or calling your MD immediately  if symptoms less severe.  You Must read complete  instructions/literature along with all the possible adverse reactions/side effects for all the Medicines you take and that have been prescribed to you. Take any new Medicines after you have completely understood and accpet all the possible adverse reactions/side effects.   Please note  You were cared for by a hospitalist during your hospital stay. If you have any questions about your discharge medications or the care you received while you were in the hospital after you are discharged, you can call the unit and asked to speak with the hospitalist on call if the hospitalist that took care of you is not available. Once you are discharged, your primary care physician will handle any further medical issues. Please note that NO REFILLS for any discharge medications will be authorized once you are discharged, as it is imperative that you return to your primary care physician (or establish a relationship with a primary care physician if you do not have one) for your aftercare needs so that they can reassess your need for medications and monitor your lab values.    On the day of Discharge:  VITAL SIGNS:  Blood pressure 127/69, pulse 74, temperature 98.2 F (36.8 C), temperature source Oral, resp. rate 16, height '5\' 4"'  (1.626 m), weight 53 kg, SpO2 97 %. PHYSICAL EXAMINATION:  GENERAL:  70 y.o.-year-old patient lying in the bed with no acute distress.  EYES: Pupils equal, round, reactive to light and accommodation. No scleral icterus. Extraocular muscles intact.  HEENT: Head atraumatic, normocephalic. Oropharynx and nasopharynx clear.  NECK:  Supple, no jugular venous distention. No thyroid enlargement, no tenderness.  LUNGS: Normal breath sounds bilaterally, no wheezing, rales,rhonchi or crepitation. No use of accessory muscles of respiration.  CARDIOVASCULAR: S1, S2 normal. No murmurs, rubs, or gallops.  ABDOMEN: Soft, non-tender, non-distended. Bowel sounds present. No organomegaly or mass.   EXTREMITIES: No pedal edema, cyanosis, or clubbing.  NEUROLOGIC: Cranial nerves II through XII are intact. Muscle strength 5/5 in all extremities. Sensation intact. Gait not checked.  PSYCHIATRIC: The patient is alert and oriented x 3.  SKIN: No obvious rash, lesion, or ulcer.  DATA REVIEW:   CBC Recent Labs  Lab 01/16/21 0617  WBC 4.5  HGB 8.3*  HCT 24.0*  PLT 223    Chemistries  Recent Labs  Lab 01/12/21 1816 01/13/21 1452 01/19/21 0444  NA 129*   < >  128*  K 3.2*   < > 4.4  CL 90*   < > 91*  CO2 32   < > 30  GLUCOSE 132*   < > 203*  BUN 17   < > 15  CREATININE 0.63   < > 0.57*  CALCIUM 8.2*   < > 9.0  MG  --    < > 1.8  AST 19  --   --   ALT 20  --   --   ALKPHOS 134*  --   --   BILITOT 0.6  --   --    < > = values in this interval not displayed.     Outpatient follow-up  Follow-up Information     Gauger, Victoriano Lain, NP. Go on 01/25/2021.   Specialty: Internal Medicine Why: 2:30pm appointment Contact information: [address] [place] [address] [phone]         [name], MD. Schedule an appointment as soon as possible for a visit on 01/31/2021.   Specialty: Cardiology Why: 11:40am appointment Contact information: [place] [address] [phone]         Lloyd Huger, MD. Schedule an appointment as soon as possible for a visit in 3 day(s).   Specialty: Oncology Why: they will call the patient back Contact information: [place] [address] [phone]         Hessie Knows, MD. Go on 01/20/2021.   Specialty: Orthopedic Surgery Why: 2pm  appointment Contact information: [address] [place] [address] [phone]                 30 Day Unplanned Readmission Risk Score    Flowsheet Row ED to Hosp-Admission (Discharged) from 01/12/2021 in [place]  30 Day Unplanned  Readmission Risk Score (%) 22.41 Filed at 01/19/2021 1200       This score is the patient's risk of an unplanned readmission within 30 days of being discharged (0 -100%). The score is based on dignosis, age, lab data, medications, orders, and past utilization.   Low:  0-14.9   Medium: 15-21.9   High: 22-29.9   Extreme: 30 and above          Management plans discussed with the patient, family and they are in agreement.  CODE STATUS: Full Code   TOTAL TIME TAKING CARE OF THIS PATIENT: 45 minutes.    Max Sane M.D on 01/19/2021 at 4:21 PM  [facility]   CC: Primary care physician; [place] Martes Victoriano Lain, NP   Note: This dictation was prepared with Dragon dictation along with smaller phrase technology. Any transcriptional errors that result from this process are unintentional.

## 2021-01-19 NOTE — Care Management Important Message (Signed)
Important Message  Patient Details  Name: Donald Lowery MRN: [id number] Date of Birth: 1950-07-10   Medicare Important Message Given:  Yes     Dannette Barbara 01/19/2021, 2:10 PM

## 2021-01-19 NOTE — TOC Transition Note (Signed)
Transition of Care [facility]) - CM/SW Discharge Note   Patient Details  Name: Donald Lowery MRN: [id number] Date of Birth: 11/04/1950  Transition of Care [facility]) CM/SW Contact:  Beverly Sessions, RN Phone Number: 01/19/2021, 10:45 AM   Clinical Narrative:     Patient to discharge today Patient and wife decline SNF at discharge.  Agreeable to home health services.  Wife request that I reach out to Daughter in law Hope to discuss home health agency preference.  She is agreeable to home health services through [place].  Referral made to [facility] with Thayer Headings with [employer] to deliver RW and [facility] prior to discharge   Final next level of care: [place] Barriers to Discharge: Barriers Resolved   Patient Goals and CMS Choice     Choice offered to / list presented to : Spouse  Discharge Placement                       Discharge Plan and Services     Post Acute Care Choice:  (TBD)          DME Arranged: Gilford Rile rolling, 3-N-1 DME Agency: [employer] Date DME Agency Contacted: 01/19/21   Representative spoke with at DME Agency: [place]: PT, OT [place] Agency: [place] Date [place]: 01/19/21   Representative spoke with at [place]: [place] ([place]) Interventions     Readmission Risk Interventions Readmission Risk Prevention Plan 01/15/2021  Transportation Screening Complete  PCP or Specialist Appt within 3-5 Days Complete  Social Work Consult for [place] Planning/Counseling [place] Not Applicable  Medication Review Press photographer) Complete  Some recent data might be hidden

## 2021-01-19 NOTE — Progress Notes (Signed)
Patient discharged to home with home health.  DME delivered to room - [facility] and walker.  Discharge instructions, medication changes and appointment follow reviewed with patient and wife.  PIV removed prior to discharge.

## 2021-01-19 NOTE — Progress Notes (Signed)
   01/19/21 1333  Clinical Encounter Type  Visited With Patient and family together  Visit Type Initial  Referral From Nurse  Consult/Referral To Chaplain  Spiritual Encounters  Spiritual Needs Brochure;Emotional  Chaplain Khiya Friese did one AD education for Mr. Donald Lowery. Pt and wife did not desire to complete an AD at the present time.

## 2021-01-19 NOTE — Progress Notes (Signed)
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  No falls, no trauma.  He was seen yesterday by his oncologist and had been on prednisone taper pack which helped relieve his pain. Pt adm for intractable back pain 2/2 bony metastases.    PT Comments    Pt received in Semi-Fowler's position and agreeable to therapy.  Pt performed much better during session and was able to have much more controlled ambulation pattern without significant weakness.  Pt updated d/c status listed below to home with HHPT services due to needing consistent strengthening follow hospital stay.  Pt and wife agreeable with this change in recommendation.    Recommendations for follow up therapy are one component of a multi-disciplinary discharge planning process, led by the attending physician.  Recommendations may be updated based on patient status, additional functional criteria and insurance authorization.  Follow Up Recommendations  Supervision/Assistance - 24 hour;Home health PT     Equipment Recommendations  Rolling walker with 5" wheels    Recommendations for Other Services       Precautions / Restrictions Precautions Precautions: Fall Required Braces or Orthoses: Spinal Brace Spinal Brace: Applied in sitting position (LSO)     Mobility  Bed Mobility Overal bed mobility: Needs Assistance Bed Mobility: Sidelying to Sit;Sit to Sidelying   Sidelying to sit: Min guard     Sit to sidelying: Min guard General bed  mobility comments: CGA for log roll into sitting position.    Transfers Overall transfer level: Needs assistance Equipment used: Rolling walker (2 wheeled) Transfers: Sit to/from Stand Sit to Stand: Min guard         General transfer comment: CGA for safety  Ambulation/Gait Ambulation/Gait assistance: Min guard Gait Distance (Feet): 80 Feet Assistive device: Rolling walker (2 wheeled) Gait Pattern/deviations: Step-to pattern;Decreased step length - right;Decreased step length - left;Decreased stride length Gait velocity: decreased   General Gait Details: Pt with much better ambulation methods today, however still required verbal cuing to not pick up the walker and utilize wheels in front to assist with momentum.   Stairs             Wheelchair Mobility    Modified Rankin (Stroke Patients Only)       Balance Overall balance assessment: Needs assistance Sitting-balance support: Feet supported;Bilateral upper extremity supported Sitting balance-Leahy Scale: Fair Sitting balance - Comments: Pt with fair sitting balance.   Standing balance support: Bilateral upper extremity supported Standing balance-Leahy Scale: Fair Standing balance comment: UE support to sustain static standing d/t weakness                            Cognition Arousal/Alertness: Awake/alert Behavior During Therapy: WFL for tasks assessed/performed Overall Cognitive Status: Difficult to assess                                 General Comments: Pt much more communicative today during session and in  better spirits.      Exercises      General Comments        Pertinent Vitals/Pain Pain Assessment: Faces Faces Pain Scale: Hurts a little bit Pain Location: back/feet Pain Descriptors / Indicators: Pins and needles;Grimacing Pain Intervention(s): Limited activity within patient's tolerance;Monitored during session;Repositioned    Home Living                       Prior Function            PT Goals (current goals can now be found in the care plan section) Acute Rehab PT Goals Patient Stated Goal: to reduce pain and go home PT Goal Formulation: With patient Time For Goal Achievement: 02/01/21 Potential to Achieve Goals: Good Progress towards PT goals: Progressing toward goals    Frequency    7X/week      PT Plan Discharge plan needs to be updated    Co-evaluation              AM-PAC PT "6 Clicks" Mobility   Outcome Measure  Help needed turning from your back to your side while in a flat bed without using bedrails?: A Little Help needed moving from lying on your back to sitting on the side of a flat bed without using bedrails?: A Little Help needed moving to and from a bed to a chair (including a wheelchair)?: A Little Help needed standing up from a chair using your arms (e.g., wheelchair or bedside chair)?: A Little Help needed to walk in hospital room?: A Little Help needed climbing 3-5 steps with a railing? : A Lot 6 Click Score: 17    End of Session Equipment Utilized During Treatment: Gait belt;Back brace Activity Tolerance: Patient limited by fatigue Patient left: in bed;with call bell/phone within reach;with bed alarm set;with family/visitor present Nurse Communication: Mobility status PT Visit Diagnosis: Unsteadiness on feet (R26.81);Other abnormalities of gait and mobility (R26.89);Muscle weakness (generalized) (M62.81);Difficulty in walking, not elsewhere classified (R26.2);Pain Pain - part of body:  (back)     Time: 2774-1287 PT Time Calculation (min) (ACUTE ONLY): 23 min  Charges:  $Gait Training: 23-37 mins                     Gwenlyn Saran, PT, DPT 01/19/21, 12:47 PM    Christie Nottingham 01/19/2021, 12:46 PM

## 2021-01-20 ENCOUNTER — Telehealth: Payer: Self-pay | Admitting: Internal Medicine

## 2021-01-20 ENCOUNTER — Ambulatory Visit: Payer: Medicare HMO

## 2021-01-20 ENCOUNTER — Encounter: Payer: Self-pay | Admitting: Internal Medicine

## 2021-01-20 MED ORDER — FENTANYL 50 MCG/HR TD PT72: 1.0000 | MEDICATED_PATCH | TRANSDERMAL | 0 refills | Status: AC

## 2021-01-20 MED ORDER — OXYCODONE HCL 10 MG PO TABS: 10.0000 mg | ORAL_TABLET | ORAL | 0 refills | Status: DC | PRN

## 2021-01-20 NOTE — Telephone Encounter (Signed)
As per family request called in prescription for fentanyl patch-[facility] on [address]; oxycodone to [facility] in [place].  Pt/family will call the office for further refills next week.  GB

## 2021-01-21 ENCOUNTER — Inpatient Hospital Stay
Admission: EM | Admit: 2021-01-21 | Discharge: 2021-02-04 | DRG: 947 | Disposition: A | Discharge status: Home Health | Payer: Medicare HMO | Attending: Internal Medicine | Admitting: Internal Medicine

## 2021-01-21 ENCOUNTER — Emergency Department: Payer: Medicare HMO

## 2021-01-21 ENCOUNTER — Other Ambulatory Visit: Payer: Self-pay

## 2021-01-21 DIAGNOSIS — R262 Difficulty in walking, not elsewhere classified: Secondary | ICD-10-CM

## 2021-01-21 DIAGNOSIS — E871 Hypo-osmolality and hyponatremia: Secondary | ICD-10-CM

## 2021-01-21 DIAGNOSIS — I2510 Atherosclerotic heart disease of native coronary artery without angina pectoris: Secondary | ICD-10-CM | POA: Diagnosis present

## 2021-01-21 DIAGNOSIS — Z923 Personal history of irradiation: Secondary | ICD-10-CM

## 2021-01-21 DIAGNOSIS — Z8249 Family history of ischemic heart disease and other diseases of the circulatory system: Secondary | ICD-10-CM

## 2021-01-21 DIAGNOSIS — M25551 Pain in right hip: Secondary | ICD-10-CM | POA: Diagnosis not present

## 2021-01-21 DIAGNOSIS — G893 Neoplasm related pain (acute) (chronic): Secondary | ICD-10-CM | POA: Diagnosis not present

## 2021-01-21 DIAGNOSIS — C7951 Secondary malignant neoplasm of bone: Secondary | ICD-10-CM | POA: Diagnosis present

## 2021-01-21 DIAGNOSIS — E1142 Type 2 diabetes mellitus with diabetic polyneuropathy: Secondary | ICD-10-CM | POA: Diagnosis not present

## 2021-01-21 DIAGNOSIS — Z7982 Long term (current) use of aspirin: Secondary | ICD-10-CM

## 2021-01-21 DIAGNOSIS — I252 Old myocardial infarction: Secondary | ICD-10-CM

## 2021-01-21 DIAGNOSIS — M5441 Lumbago with sciatica, right side: Secondary | ICD-10-CM

## 2021-01-21 DIAGNOSIS — Z681 Body mass index (BMI) 19 or less, adult: Secondary | ICD-10-CM

## 2021-01-21 DIAGNOSIS — Z87891 Personal history of nicotine dependence: Secondary | ICD-10-CM

## 2021-01-21 DIAGNOSIS — Z833 Family history of diabetes mellitus: Secondary | ICD-10-CM

## 2021-01-21 DIAGNOSIS — Z79899 Other long term (current) drug therapy: Secondary | ICD-10-CM

## 2021-01-21 DIAGNOSIS — D638 Anemia in other chronic diseases classified elsewhere: Secondary | ICD-10-CM | POA: Diagnosis present

## 2021-01-21 DIAGNOSIS — Z5320 Procedure and treatment not carried out because of patient's decision for unspecified reasons: Secondary | ICD-10-CM | POA: Diagnosis present

## 2021-01-21 DIAGNOSIS — W010XXA Fall on same level from slipping, tripping and stumbling without subsequent striking against object, initial encounter: Secondary | ICD-10-CM | POA: Diagnosis present

## 2021-01-21 DIAGNOSIS — L89152 Pressure ulcer of sacral region, stage 2: Secondary | ICD-10-CM | POA: Diagnosis present

## 2021-01-21 DIAGNOSIS — J438 Other emphysema: Secondary | ICD-10-CM | POA: Diagnosis present

## 2021-01-21 DIAGNOSIS — Z8616 Personal history of COVID-19: Secondary | ICD-10-CM

## 2021-01-21 DIAGNOSIS — Z9221 Personal history of antineoplastic chemotherapy: Secondary | ICD-10-CM

## 2021-01-21 DIAGNOSIS — Z66 Do not resuscitate: Secondary | ICD-10-CM | POA: Diagnosis not present

## 2021-01-21 DIAGNOSIS — C3412 Malignant neoplasm of upper lobe, left bronchus or lung: Secondary | ICD-10-CM | POA: Diagnosis present

## 2021-01-21 DIAGNOSIS — K219 Gastro-esophageal reflux disease without esophagitis: Secondary | ICD-10-CM | POA: Diagnosis present

## 2021-01-21 DIAGNOSIS — M5459 Other low back pain: Secondary | ICD-10-CM

## 2021-01-21 DIAGNOSIS — E785 Hyperlipidemia, unspecified: Secondary | ICD-10-CM | POA: Diagnosis present

## 2021-01-21 DIAGNOSIS — C3492 Malignant neoplasm of unspecified part of left bronchus or lung: Secondary | ICD-10-CM | POA: Diagnosis present

## 2021-01-21 DIAGNOSIS — F419 Anxiety disorder, unspecified: Secondary | ICD-10-CM | POA: Diagnosis not present

## 2021-01-21 DIAGNOSIS — E538 Deficiency of other specified B group vitamins: Secondary | ICD-10-CM | POA: Diagnosis present

## 2021-01-21 DIAGNOSIS — R296 Repeated falls: Secondary | ICD-10-CM | POA: Diagnosis not present

## 2021-01-21 DIAGNOSIS — R52 Pain, unspecified: Secondary | ICD-10-CM | POA: Diagnosis present

## 2021-01-21 DIAGNOSIS — Z981 Arthrodesis status: Secondary | ICD-10-CM

## 2021-01-21 DIAGNOSIS — E43 Unspecified severe protein-calorie malnutrition: Secondary | ICD-10-CM | POA: Diagnosis present

## 2021-01-21 DIAGNOSIS — K760 Fatty (change of) liver, not elsewhere classified: Secondary | ICD-10-CM | POA: Diagnosis present

## 2021-01-21 DIAGNOSIS — I10 Essential (primary) hypertension: Secondary | ICD-10-CM | POA: Diagnosis present

## 2021-01-21 DIAGNOSIS — I700 Atherosclerosis of aorta: Secondary | ICD-10-CM | POA: Diagnosis present

## 2021-01-21 DIAGNOSIS — Z515 Encounter for palliative care: Secondary | ICD-10-CM

## 2021-01-21 LAB — COMPREHENSIVE METABOLIC PANEL
ALT: 18 U/L (ref 0–44)
AST: 23 U/L (ref 15–41)
Albumin: 2.5 g/dL — ABNORMAL LOW (ref 3.5–5.0)
Alkaline Phosphatase: 116 U/L (ref 38–126)
Anion gap: 7 (ref 5–15)
BUN: 14 mg/dL (ref 8–23)
CO2: 28 mmol/L (ref 22–32)
Calcium: 8.5 mg/dL — ABNORMAL LOW (ref 8.9–10.3)
Chloride: 93 mmol/L — ABNORMAL LOW (ref 98–111)
Creatinine, Ser: 0.6 mg/dL — ABNORMAL LOW (ref 0.61–1.24)
GFR, Estimated: >60 mL/min (ref >60)
Glucose, Bld: 122 mg/dL — ABNORMAL HIGH (ref 70–99)
Potassium: 4 mmol/L (ref 3.5–5.1)
Sodium: 128 mmol/L — ABNORMAL LOW (ref 135–145)
Total Bilirubin: 1 mg/dL (ref 0.3–1.2)
Total Protein: 6.2 g/dL — ABNORMAL LOW (ref 6.5–8.1)

## 2021-01-21 LAB — CBC WITH DIFFERENTIAL/PLATELET
Abs Immature Granulocytes: 0.27 10*3/uL — ABNORMAL HIGH (ref 0.00–0.07)
Basophils Absolute: 0 10*3/uL (ref 0.0–0.1)
Basophils Relative: 0 %
Eosinophils Absolute: 0 10*3/uL (ref 0.0–0.5)
Eosinophils Relative: 0 %
HCT: 27.7 % — ABNORMAL LOW (ref 39.0–52.0)
Hemoglobin: 9.5 g/dL — ABNORMAL LOW (ref 13.0–17.0)
Immature Granulocytes: 4 %
Lymphocytes Relative: 6 %
Lymphs Abs: 0.4 10*3/uL — ABNORMAL LOW (ref 0.7–4.0)
MCH: 29.8 pg (ref 26.0–34.0)
MCHC: 34.3 g/dL (ref 30.0–36.0)
MCV: 86.8 fL (ref 80.0–100.0)
Monocytes Absolute: 0.4 10*3/uL (ref 0.1–1.0)
Monocytes Relative: 5 %
Neutro Abs: 5.7 10*3/uL (ref 1.7–7.7)
Neutrophils Relative %: 85 %
Platelets: 263 10*3/uL (ref 150–400)
RBC: 3.19 MIL/uL — ABNORMAL LOW (ref 4.22–5.81)
RDW: 18.8 % — ABNORMAL HIGH (ref 11.5–15.5)
WBC: 6.8 10*3/uL (ref 4.0–10.5)
nRBC: 0 % (ref 0.0–0.2)

## 2021-01-21 LAB — URINALYSIS, COMPLETE (UACMP) WITH MICROSCOPIC
Bilirubin Urine: NEGATIVE
Glucose, UA: NEGATIVE mg/dL
Hgb urine dipstick: NEGATIVE
Ketones, ur: NEGATIVE mg/dL
Leukocytes,Ua: NEGATIVE
Nitrite: NEGATIVE
Protein, ur: NEGATIVE mg/dL
Specific Gravity, Urine: 1.016 (ref 1.005–1.030)
pH: 7 (ref 5.0–8.0)

## 2021-01-21 MED ORDER — OXYCODONE HCL 5 MG PO TABS
10.0000 mg | ORAL_TABLET | Freq: Once | ORAL | Status: AC
Start: 2021-01-21 — End: 2021-01-21
  Administered 2021-01-21: 10 mg via ORAL
  Filled 2021-01-21: qty 2

## 2021-01-21 MED ORDER — MAGNESIUM HYDROXIDE 400 MG/5ML PO SUSP
30.0000 mL | Freq: Every day | ORAL | Status: DC | PRN
  Administered 2021-02-03: 30 mL via ORAL
  Filled 2021-01-21: qty 30

## 2021-01-21 MED ORDER — MORPHINE SULFATE (PF) 4 MG/ML IV SOLN
4.0000 mg | Freq: Once | INTRAVENOUS | Status: AC
  Administered 2021-01-21: 4 mg via INTRAVENOUS
  Filled 2021-01-21: qty 1

## 2021-01-21 MED ORDER — ONDANSETRON HCL 4 MG/2ML IJ SOLN
4.0000 mg | Freq: Once | INTRAMUSCULAR | Status: AC
  Administered 2021-01-21: 4 mg via INTRAVENOUS
  Filled 2021-01-21: qty 2

## 2021-01-21 MED ORDER — SODIUM CHLORIDE 0.9 % IV SOLN: INTRAVENOUS | Status: DC

## 2021-01-21 MED ORDER — VITAMIN B-12 1000 MCG PO TABS
1000.0000 ug | ORAL_TABLET | Freq: Every day | ORAL | Status: DC
  Administered 2021-01-22 – 2021-02-04 (×14): 1000 ug via ORAL
  Filled 2021-01-21 (×15): qty 1

## 2021-01-21 MED ORDER — DICYCLOMINE HCL 10 MG PO CAPS
10.0000 mg | ORAL_CAPSULE | Freq: Three times a day (TID) | ORAL | Status: DC | PRN
  Filled 2021-01-21: qty 1

## 2021-01-21 MED ORDER — GABAPENTIN 100 MG PO CAPS
100.0000 mg | ORAL_CAPSULE | Freq: Three times a day (TID) | ORAL | Status: DC
  Administered 2021-01-22: 100 mg via ORAL
  Filled 2021-01-21: qty 1

## 2021-01-21 MED ORDER — CYCLOBENZAPRINE HCL 10 MG PO TABS
5.0000 mg | ORAL_TABLET | Freq: Three times a day (TID) | ORAL | Status: DC | PRN
  Administered 2021-01-22: 5 mg via ORAL
  Filled 2021-01-21: qty 1

## 2021-01-21 MED ORDER — SENNOSIDES-DOCUSATE SODIUM 8.6-50 MG PO TABS: 2.0000 | ORAL_TABLET | Freq: Every evening | ORAL | Status: DC | PRN

## 2021-01-21 MED ORDER — SODIUM CHLORIDE 0.9 % IV BOLUS
1000.0000 mL | Freq: Once | INTRAVENOUS | Status: AC
  Administered 2021-01-21: 1000 mL via INTRAVENOUS

## 2021-01-21 MED ORDER — ACETAMINOPHEN 325 MG PO TABS: 650.0000 mg | ORAL_TABLET | Freq: Four times a day (QID) | ORAL | Status: DC | PRN

## 2021-01-21 MED ORDER — MUSCLE RUB 10-15 % EX CREA
1.0000 "application " | TOPICAL_CREAM | Freq: Every day | CUTANEOUS | Status: DC | PRN
  Filled 2021-01-21: qty 85

## 2021-01-21 MED ORDER — EZETIMIBE 10 MG PO TABS
10.0000 mg | ORAL_TABLET | Freq: Every day | ORAL | Status: DC
  Administered 2021-01-22 – 2021-02-04 (×13): 10 mg via ORAL
  Filled 2021-01-21 (×14): qty 1

## 2021-01-21 MED ORDER — ENOXAPARIN SODIUM 40 MG/0.4ML IJ SOSY
40.0000 mg | PREFILLED_SYRINGE | INTRAMUSCULAR | Status: DC
  Administered 2021-01-22 – 2021-02-04 (×14): 40 mg via SUBCUTANEOUS
  Filled 2021-01-21 (×14): qty 0.4

## 2021-01-21 MED ORDER — ONDANSETRON HCL 4 MG PO TABS: 4.0000 mg | ORAL_TABLET | Freq: Four times a day (QID) | ORAL | Status: DC | PRN

## 2021-01-21 MED ORDER — TRAZODONE HCL 50 MG PO TABS
25.0000 mg | ORAL_TABLET | Freq: Every evening | ORAL | Status: DC | PRN
  Administered 2021-01-28 – 2021-02-03 (×6): 25 mg via ORAL
  Filled 2021-01-21 (×6): qty 1

## 2021-01-21 MED ORDER — ACETAMINOPHEN 650 MG RE SUPP: 650.0000 mg | Freq: Four times a day (QID) | RECTAL | Status: DC | PRN

## 2021-01-21 MED ORDER — NALOXONE HCL 2 MG/2ML IJ SOSY
0.4000 mg | PREFILLED_SYRINGE | INTRAMUSCULAR | Status: DC | PRN
  Filled 2021-01-21: qty 2

## 2021-01-21 MED ORDER — ONDANSETRON HCL 4 MG/2ML IJ SOLN
4.0000 mg | Freq: Four times a day (QID) | INTRAMUSCULAR | Status: DC | PRN
  Administered 2021-02-01: 4 mg via INTRAVENOUS
  Filled 2021-01-21: qty 2

## 2021-01-21 MED ORDER — ICOSAPENT ETHYL 1 G PO CAPS
2.0000 g | ORAL_CAPSULE | Freq: Two times a day (BID) | ORAL | Status: DC
  Administered 2021-01-22 – 2021-02-04 (×25): 2 g via ORAL
  Filled 2021-01-21 (×30): qty 2

## 2021-01-21 MED ORDER — FENTANYL 50 MCG/HR TD PT72
1.0000 | MEDICATED_PATCH | TRANSDERMAL | Status: DC
  Administered 2021-01-22: 1 via TRANSDERMAL
  Filled 2021-01-21: qty 1

## 2021-01-21 MED ORDER — METOPROLOL TARTRATE 25 MG PO TABS
25.0000 mg | ORAL_TABLET | Freq: Two times a day (BID) | ORAL | Status: DC
  Administered 2021-01-22 – 2021-02-04 (×28): 25 mg via ORAL
  Filled 2021-01-21 (×28): qty 1

## 2021-01-21 MED ORDER — OXYCODONE HCL 5 MG PO TABS
10.0000 mg | ORAL_TABLET | ORAL | Status: DC | PRN
  Administered 2021-01-22 – 2021-01-26 (×17): 10 mg via ORAL
  Filled 2021-01-21 (×19): qty 2

## 2021-01-21 MED ORDER — ASPIRIN EC 81 MG PO TBEC
81.0000 mg | DELAYED_RELEASE_TABLET | Freq: Every day | ORAL | Status: DC
  Administered 2021-01-22 – 2021-02-04 (×14): 81 mg via ORAL
  Filled 2021-01-21 (×14): qty 1

## 2021-01-21 MED ORDER — HYDROMORPHONE HCL 1 MG/ML IJ SOLN
0.5000 mg | Freq: Once | INTRAMUSCULAR | Status: AC
  Administered 2021-01-21: 0.5 mg via INTRAVENOUS
  Filled 2021-01-21: qty 1

## 2021-01-21 MED ORDER — ISOSORBIDE MONONITRATE ER 60 MG PO TB24
30.0000 mg | ORAL_TABLET | Freq: Every day | ORAL | Status: DC
  Administered 2021-01-22 – 2021-02-04 (×13): 30 mg via ORAL
  Filled 2021-01-21 (×14): qty 1

## 2021-01-21 MED ORDER — ATORVASTATIN CALCIUM 20 MG PO TABS: 80.0000 mg | ORAL_TABLET | Freq: Every day | ORAL | Status: DC

## 2021-01-21 MED ORDER — SUCRALFATE 1 G PO TABS
0.5000 g | ORAL_TABLET | Freq: Three times a day (TID) | ORAL | Status: DC
  Administered 2021-01-22 – 2021-02-04 (×40): 0.5 g via ORAL
  Filled 2021-01-21 (×40): qty 1

## 2021-01-21 MED ORDER — ENSURE ENLIVE PO LIQD
237.0000 mL | Freq: Three times a day (TID) | ORAL | Status: DC
  Administered 2021-01-22 – 2021-02-04 (×38): 237 mL via ORAL

## 2021-01-21 NOTE — ED Notes (Signed)
Patient transported to CT 

## 2021-01-21 NOTE — ED Notes (Signed)
Patient returned from CT

## 2021-01-21 NOTE — ED Notes (Signed)
Pt is currently being treated for metastatic cancer (both lungs, groin/nodes, spine) and had spinal fusion surgery on Tues 9/20 for treatment of bone cancer. He fell on the stairs on Thursday and injured his right hip which he now states is in severe 10/10 pain which prevents him from being able to sit up. He is unable to tolerate using the bathroom due to hip pain. Had another mechanical fall yesterday when his walker got caught up on something in the floor but did not sustain injury at that time. Pt is hard of hearing but alert and oriented and is able to answer questions appropriately. Family at bedside.

## 2021-01-21 NOTE — H&P (Addendum)
[place]   PATIENT NAME: Donald Lowery    MR#:  [id number]  DATE OF BIRTH:  07-Feb-1951  DATE OF ADMISSION:  01/21/2021  PRIMARY CARE PHYSICIAN: [name], NP   Patient is coming from: Home  REQUESTING/REFERRING PHYSICIAN: Cuthriell, Leola Brazil  CHIEF COMPLAINT:   Chief Complaint  Patient presents with  . Hip Pain    HISTORY OF PRESENT ILLNESS:  Donald Lowery is a 70 y.o. 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  The patient underwent l kyphoplasty for pathological L3 closed wedge compression fracture 3 days ago.  Over the last few days he sustained mechanical falls on his right hip.  Today he was not able to stand on or to sit from pain.  He denies any presyncope or syncope.  No headache or dizziness or blurred vision.  No paresthesias or focal muscle weakness.  No chest pain or palpitations.  No tinnitus or vertigo.  He denies any dysuria, oliguria, urinary frequency or urgency or flank pain.  No cough or wheezing or dyspnea.  ED Course: When he came to the ER vital signs were within normal.  Labs revealed hyponatremia 128 and hypochloremia of 93 with otherwise unremarkable CMP.  Albumin was 2.5 and total protein 6.2.  CBC showed anemia with hemoglobin 9.5 and hematocrit 27.7 better than previous levels.  Imaging: X-ray of the right hip showed no metastatic lesions within the bilateral iliac crests and no acute fracture or dislocation.  Right hip CT showed no acute fracture or dislocation and bilateral hydroceles.Lumbar spine CT showed the following: 1. Interval performance of vertebral augmentation for  previously identified pathologic fracture at L3. No visible complication. Associated height loss is stable without further interval collapse. Posterior convex bowing of the L3 vertebral body with resultant moderate to advanced spinal stenosis, similar to previous. Moderate to severe right L3 foraminal stenosis also grossly stable. 2. Additional lytic metastatic lesion involving the left iliac wing. 3. No other acute abnormality within the lumbar spine. 4. Aortic Atherosclerosis.  The patient was given 0.5 mg IV Dilaudid, 4 mg IV morphine sulfate and 4 mg IV Zofran, 10 mg of p.o. oxycodone and 1 L bolus of IV normal saline.  He will be admitted to a medical observation bed for further evaluation and management PAST MEDICAL HISTORY:   Past Medical History:  Diagnosis Date  . Aortic atherosclerosis ([place])   . [place] hernia 09/21/2020   fatty  . Coronary artery disease    a. 04/2018 NSTEMI/Cath: LM nl, LAD 50p, 55m/d diffuse-small caliber, LCX heavily Ca2+ sev prox/mid dzs, RCA 90 diff distal dzs into RPL and RPDA. EF 50-55%-->Med Rx.  . Hepatic steatosis   . History of 2019 novel coronavirus disease (COVID-19) 10/12/2020  . History of echocardiogram    a. 04/2018 Echo: EF 55-60%, no rwma, mild MR, mildly dil LA. Nl RV fxn.  . Hyperlipidemia   . Hypertension   . NSTEMI (non-ST elevated myocardial infarction) ([place]) 05/15/2018  . 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 Paraseptal emphysema ([place])   . T2DM (type 2 diabetes mellitus) ([place])   .  Tobacco abuse    a. Quit 04/2018.  -Stage IV non-small cell lung cancer with bone metastasis, lytic lesion to the left iliac crest, status post chemotherapy and radiotherapy  PAST SURGICAL HISTORY:   Past Surgical History:  Procedure Laterality Date  . BRAIN SURGERY    . CARDIAC CATHETERIZATION    . IR IMAGING GUIDED PORT INSERTION  10/28/2020  . KYPHOPLASTY N/A  01/17/2021   Procedure: Claybon Jabs RFA;  Surgeon: Hessie Knows, MD;  Location: [facility] ORS;  Service: Orthopedics;  Laterality: N/A;  . LEFT HEART CATH AND CORONARY ANGIOGRAPHY N/A 05/16/2018   Procedure: LEFT HEART CATH AND CORONARY ANGIOGRAPHY poss PCI;  Surgeon: [name], MD;  Location: [facility];  Service: Cardiovascular;  Laterality: N/A;  . VIDEO BRONCHOSCOPY WITH ENDOBRONCHIAL NAVIGATION N/A 10/24/2020   Procedure: ROBOTIC ASSISTED VIDEO BRONCHOSCOPY WITH ENDOBRONCHIAL NAVIGATION;  Surgeon: Tyler Pita, MD;  Location: [facility] ORS;  Service: Pulmonary;  Laterality: N/A;    SOCIAL HISTORY:   Social History   Tobacco Use  . Smoking status: Former    Packs/day: 1.00    Years: 45.00    Pack years: 45.00    Types: Cigarettes    Quit date: 05/15/2018    Years since quitting: 2.6  . Smokeless tobacco: Never  Substance Use Topics  . Alcohol use: Never    FAMILY HISTORY:   Family History  Problem Relation Age of Onset  . Heart Problems Mother   . Heart attack Father   . Diabetes Brother   . Heart Problems Brother     DRUG ALLERGIES:  No Known Allergies  REVIEW OF SYSTEMS:   ROS As per history of present illness. All pertinent systems were reviewed above. Constitutional, HEENT, cardiovascular, respiratory, GI, GU, musculoskeletal, neuro, psychiatric, endocrine, integumentary and hematologic systems were reviewed and are otherwise negative/unremarkable except for positive findings mentioned above in the HPI.   MEDICATIONS AT HOME:   Prior to Admission medications   Medication Sig Start Date End Date Taking? Authorizing Provider  aspirin EC 81 MG EC tablet Take 1 tablet (81 mg total) by mouth daily. 05/17/18   Hillary Bow, MD  atorvastatin (LIPITOR) 80 MG tablet Take 1 tablet (80 mg total) by mouth daily at 6 PM. 12/26/20   Gollan, Kathlene November, MD  cyclobenzaprine (FLEXERIL) 5 MG tablet Take 1 tablet (5 mg total) by mouth 3 (three) times daily as  needed for muscle spasms. 01/11/21   Lloyd Huger, MD  dicyclomine (BENTYL) 10 MG capsule Take 1 capsule (10 mg total) by mouth 3 (three) times daily as needed for up to 14 days for spasms. 09/17/20 01/11/21  Harvest Dark, MD  ezetimibe (ZETIA) 10 MG tablet Take 1 tablet (10 mg total) by mouth daily. 12/26/20 03/26/21  [name], MD  feeding supplement (ENSURE ENLIVE / ENSURE PLUS) LIQD Take 237 mLs by mouth 3 (three) times daily between meals. 01/19/21   Max Sane, MD  fentaNYL (DURAGESIC) 50 MCG/HR Place 1 patch onto the skin every 3 (three) days. 01/20/21   Cammie Sickle, MD  gabapentin (NEURONTIN) 100 MG capsule Take 1 capsule (100 mg total) by mouth 3 (three) times daily. 01/19/21   Max Sane, MD  icosapent Ethyl (VASCEPA) 1 g capsule Take 2 capsules (2 g total) by mouth 2 (two) times daily. 10/21/20 01/12/21  [name], MD  isosorbide mononitrate (IMDUR) 30 MG 24 hr tablet Take 1 tablet (30 mg total) by mouth daily. 12/26/20   Gollan,  Kathlene November, MD  Menthol-Methyl Salicylate (ICY HOT) 87-68 % STCK Apply 1 application topically daily as needed (shoulder pain).    [provider]  metFORMIN (GLUCOPHAGE) 500 MG tablet Take 500 mg by mouth every morning. 08/27/20   [provider]  metoprolol tartrate (LOPRESSOR) 25 MG tablet Take 1 tablet (25 mg total) by mouth 2 (two) times daily. 01/19/21   Max Sane, MD  naloxone [facility]) 2 MG/2ML injection Inject 0.4 mLs (0.4 mg total) into the vein as needed. 01/19/21   Max Sane, MD  Oxycodone HCl 10 MG TABS Take 1 tablet (10 mg total) by mouth every 4 (four) hours as needed. 01/20/21   Cammie Sickle, MD  senna-docusate (SENOKOT-S) 8.6-50 MG tablet Take 2 tablets by mouth at bedtime as needed for mild constipation. 01/19/21 02/18/21  Max Sane, MD  sucralfate (CARAFATE) 1 g tablet Take 0.5 tablets (0.5 g total) by mouth 3 (three) times daily before meals. 11/15/20   Noreene Filbert, MD  vitamin B-12  (CYANOCOBALAMIN) 1000 MCG tablet Take 1,000 mcg by mouth daily.    [provider]  prochlorperazine (COMPAZINE) 10 MG tablet Take 1 tablet (10 mg total) by mouth every 6 (six) hours as needed (Nausea or vomiting). Patient not taking: No sig reported 10/28/20 01/12/21  Lloyd Huger, MD      VITAL SIGNS:  Blood pressure 125/78, pulse 91, temperature 98.7 F (37.1 C), temperature source Oral, resp. rate 16, SpO2 96 %.  PHYSICAL EXAMINATION:  Physical Exam  GENERAL:  70 y.o.-year-old patient lying in the bed with no acute distress.  EYES: Pupils equal, round, reactive to light and accommodation. No scleral icterus. Extraocular muscles intact.  HEENT: Head atraumatic, normocephalic. Oropharynx and nasopharynx clear.  NECK:  Supple, no jugular venous distention. No thyroid enlargement, no tenderness.  LUNGS: Normal breath sounds bilaterally, no wheezing, rales,rhonchi or crepitation. No use of accessory muscles of respiration.  CARDIOVASCULAR: Regular rate and rhythm, S1, S2 normal. No murmurs, rubs, or gallops.  ABDOMEN: Soft, nondistended, nontender. Bowel sounds present. No organomegaly or mass.  EXTREMITIES: No pedal edema, cyanosis, or clubbing. Musculoskeletal: Right lateral hip tenderness. NEUROLOGIC: Cranial nerves II through XII are intact. Muscle strength 5/5 in all extremities. Sensation intact. Gait not checked.  PSYCHIATRIC: The patient is alert and oriented x 3.  Normal affect and good eye contact. SKIN: No obvious rash, lesion, or ulcer.   LABORATORY PANEL:   CBC Recent Labs  Lab 01/21/21 2001  WBC 6.8  HGB 9.5*  HCT 27.7*  PLT 263   ------------------------------------------------------------------------------------------------------------------  Chemistries  Recent Labs  Lab 01/19/21 0444 01/21/21 2001  NA 128* 128*  K 4.4 4.0  CL 91* 93*  CO2 30 28  GLUCOSE 203* 122*  BUN 15 14  CREATININE 0.57* 0.60*  CALCIUM 9.0 8.5*  MG 1.8  --   AST   --  23  ALT  --  18  ALKPHOS  --  116  BILITOT  --  1.0   ------------------------------------------------------------------------------------------------------------------  Cardiac Enzymes No results for input(s): TROPONINI in the last 168 hours. ------------------------------------------------------------------------------------------------------------------  RADIOLOGY:  CT Lumbar Spine Wo Contrast  Result Date: 01/21/2021 CLINICAL DATA:  Initial evaluation for worsening back pain, right hip pain, recent surgery. EXAM: CT LUMBAR SPINE WITHOUT CONTRAST TECHNIQUE: Multidetector CT imaging of the lumbar spine was performed without intravenous contrast administration. Multiplanar CT image reconstructions were also generated. COMPARISON:  MRI from 01/13/2021. FINDINGS: Segmentation: Standard. Lowest well-formed disc space labeled the L5-S1 level. Alignment:  Mild sigmoid scoliotic curvature of the thoracolumbar spine. 3 mm retrolisthesis of L1 on L2. Vertebrae: There has been interval performance of vertebral augmentation for previously identified pathologic fracture at L3. No visible complicating features. Associated height loss is stable without significant interval collapse. Posterior convex bowing of the L3 vertebral body with resultant moderate spinal stenosis is grossly stable. Extraosseous extension of tumor into the adjacent right psoas musculature also grossly similar. Vertebral body height otherwise maintained with no other acute or interval fracture. Visualized sacrum intact. SI joints symmetric and normal. There is an additional lytic metastatic lesion involving the left iliac wing (series 7, image 45). No other visible osseous metastatic disease. Paraspinal and other soft tissues: Extraosseous extension of tumor into the right psoas musculature related to the L3 metastasis, grossly stable from prior MRI. Paraspinous soft tissues demonstrate no other acute finding. Advanced aorto bi-iliac  atherosclerotic disease. Disc levels: L1-2: Trace retrolisthesis with mild disc bulge. No spinal stenosis. Foramina remain patent. L2-3: Mild disc bulge with facet hypertrophy. No spinal stenosis. Foramina remain patent. L3-4: Degenerative intervertebral disc space narrowing with diffuse disc bulge. Mild facet hypertrophy. Moderate to advanced spinal stenosis related to posterior convex bowing related to the pathologic L3 compression fracture noted, grossly similar. Moderate to severe right with mild left L3 foraminal stenosis, grossly stable. L4-5: Mild disc bulge with facet hypertrophy. No spinal stenosis. No more than mild bilateral L4 foraminal narrowing. L5-S1: Mild disc bulge. Right greater than left facet arthrosis. No spinal stenosis. Foramina remain patent. IMPRESSION: 1. Interval performance of vertebral augmentation for previously identified pathologic fracture at L3. No visible complication. Associated height loss is stable without further interval collapse. Posterior convex bowing of the L3 vertebral body with resultant moderate to advanced spinal stenosis, similar to previous. Moderate to severe right L3 foraminal stenosis also grossly stable. 2. Additional lytic metastatic lesion involving the left iliac wing. 3. No other acute abnormality within the lumbar spine. 4. Aortic Atherosclerosis (ICD10-I70.0). Electronically Signed   By: Jeannine Boga M.D.   On: 01/21/2021 20:40   CT Hip Right Wo Contrast  Result Date: 01/21/2021 CLINICAL DATA:  Fall, right hip pain EXAM: CT OF THE RIGHT HIP WITHOUT CONTRAST TECHNIQUE: Multidetector CT imaging of the right hip was performed according to the standard protocol. Multiplanar CT image reconstructions were also generated. COMPARISON:  X-ray 01/21/2021 FINDINGS: Bones/Joint/Cartilage Right hip is intact without fracture or dislocation. No evidence of avascular necrosis of the femoral head. Right hip joint space is maintained. No hip joint effusion is  evident. Visualized portion of the right hemipelvis is intact. Pubic symphysis and right sacroiliac joint intact without diastasis. No lytic or sclerotic bony lesions are identified. Site of known right iliac crest metastatic lesion is not included within the field of view on the current study. Ligaments Suboptimally assessed by CT. Muscles and Tendons No acute musculotendinous abnormality by CT. Soft tissues Mild subcutaneous edema overlying the greater trochanter. No organized fluid collection or hematoma. No abnormally enlarged right inguinal lymph nodes. Bilateral hydroceles. Atherosclerotic vascular calcifications are present. IMPRESSION: 1. No acute fracture or dislocation of the right hip. 2. Bilateral hydroceles. Electronically Signed   By: Davina Poke D.O.   On: 01/21/2021 20:22   DG Hip Unilat  With Pelvis 2-3 Views Right  Result Date: 01/21/2021 CLINICAL DATA:  Right hip pain. History of metastatic non-small cell lung cancer EXAM: DG HIP (WITH OR WITHOUT PELVIS) 2-3V RIGHT COMPARISON:  MRI pelvis 01/13/2021 FINDINGS: No acute fracture or dislocation. Right  hip joint space is preserved. Known destructive lesion at the left iliac crest is not well seen, partially obscured by overlying stool and bowel gas. Small marrow replacing lesion of the right iliac crest seen on recent MRI is not well demonstrated radiographically. Otherwise, no evidence of a new destructive bone lesion. IMPRESSION: 1. No acute fracture or dislocation. 2. Known metastatic lesions within the bilateral iliac crests are not well seen radiographically. Electronically Signed   By: Davina Poke D.O.   On: 01/21/2021 18:25      IMPRESSION AND PLAN:  Active Problems:   Right hip pain 1.  Right hip pain status post recurrent mechanical falls with inability to ambulate.  The patient is status post lumbar fusion.  He has associated hyponatremia that is chronic and close to his baseline. - The patient will be admitted to an  observation medical bed. - Pain management to be provided. - We will obtain a physical therapy consultation. - Orthopedic evaluation can be obtained if needed with persistent inability to ambulate. - He will be hydrated with IV normal saline.  2.  Dyslipidemia. - We will continue statin therapy and Zetia.  3.  Type 2 diabetes mellitus with peripheral neuropathy. - We will place the patient on supplemental coverage with NovoLog. - We will hold off metformin. - We will continue Neurontin.  4.  Coronary artery disease. - We will continue Imdur and Lopressor as well as statin therapy and aspirin.  5.  Vitamin B12 deficiency. - We will continue vitamin B12.  6.  GERD. - We will continue Carafate.   DVT prophylaxis: Lovenox.  Code Status: full code.  Family Communication:  The plan of care was discussed in details with the patient (and family). I answered all questions. The patient agreed to proceed with the above mentioned plan. Further management will depend upon hospital course. Disposition Plan: Back to previous home environment Consults called: none.  All the records are reviewed and case discussed with ED provider.  Status is: Observation  The patient remains OBS appropriate and will d/c before 2 midnights.  Dispo: The patient is from: Home              Anticipated d/c is to: Home              Patient currently is not medically stable to d/c.   Difficult to place patient No  TOTAL TIME TAKING CARE OF THIS PATIENT: 55 minutes.    Christel Mormon M.D on 01/21/2021 at 11:11 PM  [facility]   From 7 PM-7 AM, contact night-coverage [email or web address]  CC: Primary care physician; Gauger, Victoriano Lain, NP

## 2021-01-21 NOTE — ED Triage Notes (Signed)
Pt comes with c/o right hip pain. Pt states pain when ambulating. Pt states recent back surgery. Pt fell on Thursday per family, no LOC or hitting head

## 2021-01-21 NOTE — ED Provider Notes (Signed)
[facility] Emergency Department Provider Note  ____________________________________________  Time seen: Approximately 7:30 PM  I have reviewed the triage vital signs and the nursing notes.   HISTORY  Chief Complaint Hip Pain    HPI Donald Lowery is a 70 y.o. male who presents emergency department complaining of increased right hip pain to the point of being unable to stand, sit.  Patient has recently undergone spinal fusion, 4 days ago.  Patient was discharged home and has had 2 falls since his discharge.  Patient describes these mostly as mechanical.  He states that he is tripped over his walker causing him to fall.  The first time he landed on his right hip and he is having excruciating pain to his right hip at this time.  Patient is still having back pain but states that this is unchanged since prior to the fall.  Patient has had no bowel or bladder dysfunction, saddle anesthesia or paresthesias.  Again he describes these mostly as mechanical falls secondary to tripping over his walker.  He did not hit his head or lose consciousness.  Patient is typically ambulatory but has not been able to ambulate on this right hip.  History of aortic atherosclerosis, coronary artery disease, hypertension, type 2 diabetes, non-small cell lung cancer with metastasis.       Past Medical History:  Diagnosis Date   Aortic atherosclerosis ([place])    [place] hernia 09/21/2020   fatty   Coronary artery disease    a. 04/2018 NSTEMI/Cath: LM nl, LAD 50p, 105m/d diffuse-small caliber, LCX heavily Ca2+ sev prox/mid dzs, RCA 90 diff distal dzs into RPL and RPDA. EF 50-55%-->Med Rx.   Hepatic steatosis    History of 2019 novel coronavirus disease (COVID-19) 10/12/2020   History of echocardiogram    a. 04/2018 Echo: EF 55-60%, no rwma, mild MR, mildly dil LA. Nl RV fxn.   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 Quit 04/2018.    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   Hypercalcemia of malignancy 11/10/2020   Hypertension 10/05/2020   Hyperlipidemia 10/05/2020   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[place]) 09/21/2020   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[place]) 09/21/2020   Cancer related pain 09/21/2020   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/21/2020   Type 2 diabetes mellitus without complication, without long-term current use of insulin ([place]) 05/31/2020   Aortic atherosclerosis ([place]) 02/10/2019   CAD (coronary artery disease) 05/16/2018   NSTEMI (non-ST elevated myocardial infarction) ([place]) 05/15/2018   Emphysema of lung ([place]) 05/15/2018    Past Surgical History:  Procedure Laterality Date   BRAIN SURGERY     CARDIAC CATHETERIZATION     IR IMAGING GUIDED PORT INSERTION  10/28/2020  KYPHOPLASTY N/A 01/17/2021   Procedure: [place]bon Jabs RFA;  Surgeon: Hessie Knows, MD;  Location: [facility] ORS;  Service: Orthopedics;  Laterality: N/A;   LEFT HEART CATH AND CORONARY ANGIOGRAPHY N/A 05/16/2018   Procedure: LEFT HEART CATH AND CORONARY ANGIOGRAPHY poss PCI;  Surgeon: [name], MD;  Location: [facility];  Service: Cardiovascular;  Laterality: N/A;   VIDEO BRONCHOSCOPY WITH ENDOBRONCHIAL NAVIGATION N/A 10/24/2020   Procedure: ROBOTIC ASSISTED VIDEO BRONCHOSCOPY  WITH ENDOBRONCHIAL NAVIGATION;  Surgeon: Tyler Pita, MD;  Location: [facility] ORS;  Service: Pulmonary;  Laterality: N/A;    Prior to Admission medications   Medication Sig Start Date End Date Taking? Authorizing Provider  aspirin EC 81 MG EC tablet Take 1 tablet (81 mg total) by mouth daily. 05/17/18   Hillary Bow, MD  atorvastatin (LIPITOR) 80 MG tablet Take 1 tablet (80 mg total) by mouth daily at 6 PM. 12/26/20   Gollan, Kathlene November, MD  cyclobenzaprine (FLEXERIL) 5 MG tablet Take 1 tablet (5 mg total) by mouth 3 (three) times daily as needed for muscle spasms. 01/11/21   Lloyd Huger, MD  dicyclomine (BENTYL) 10 MG capsule Take 1 capsule (10 mg total) by mouth 3 (three) times daily as needed for up to 14 days for spasms. 09/17/20 01/11/21  Harvest Dark, MD  ezetimibe (ZETIA) 10 MG tablet Take 1 tablet (10 mg total) by mouth daily. 12/26/20 03/26/21  [name], MD  feeding supplement (ENSURE ENLIVE / ENSURE PLUS) LIQD Take 237 mLs by mouth 3 (three) times daily between meals. 01/19/21   Max Sane, MD  fentaNYL (DURAGESIC) 50 MCG/HR Place 1 patch onto the skin every 3 (three) days. 01/20/21   Cammie Sickle, MD  gabapentin (NEURONTIN) 100 MG capsule Take 1 capsule (100 mg total) by mouth 3 (three) times daily. 01/19/21   Max Sane, MD  icosapent Ethyl (VASCEPA) 1 g capsule Take 2 capsules (2 g total) by mouth 2 (two) times daily. 10/21/20 01/12/21  [name], MD  isosorbide mononitrate (IMDUR) 30 MG 24 hr tablet Take 1 tablet (30 mg total) by mouth daily. 12/26/20   [name], MD  Menthol-Methyl Salicylate (ICY HOT) 69-48 % STCK Apply 1 application topically daily as needed (shoulder pain).    [provider]  metFORMIN (GLUCOPHAGE) 500 MG tablet Take 500 mg by mouth every morning. 08/27/20   [provider]  metoprolol tartrate (LOPRESSOR) 25 MG tablet Take 1 tablet (25 mg total) by mouth 2 (two) times daily. 01/19/21   Max Sane, MD   naloxone [facility]) 2 MG/2ML injection Inject 0.4 mLs (0.4 mg total) into the vein as needed. 01/19/21   Max Sane, MD  Oxycodone HCl 10 MG TABS Take 1 tablet (10 mg total) by mouth every 4 (four) hours as needed. 01/20/21   Cammie Sickle, MD  senna-docusate (SENOKOT-S) 8.6-50 MG tablet Take 2 tablets by mouth at bedtime as needed for mild constipation. 01/19/21 02/18/21  Max Sane, MD  sucralfate (CARAFATE) 1 g tablet Take 0.5 tablets (0.5 g total) by mouth 3 (three) times daily before meals. 11/15/20   Noreene Filbert, MD  vitamin B-12 (CYANOCOBALAMIN) 1000 MCG tablet Take 1,000 mcg by mouth daily.    [provider]  prochlorperazine (COMPAZINE) 10 MG tablet Take 1 tablet (10 mg total) by mouth every 6 (six) hours as needed (Nausea or vomiting). Patient not taking: No sig reported 10/28/20 01/12/21  Lloyd Huger, MD    Allergies Patient has  no known allergies.  Family History  Problem Relation Age of Onset   Heart Problems Mother    Heart attack Father    Diabetes Brother    Heart Problems Brother     Social History Social History   Tobacco Use   Smoking status: Former    Packs/day: 1.00    Years: 45.00    Pack years: 45.00    Types: Cigarettes    Quit date: 05/15/2018    Years since quitting: 2.6   Smokeless tobacco: Never  Vaping Use   Vaping Use: Never used  Substance Use Topics   Alcohol use: Never   Drug use: Never     Review of Systems  Constitutional: No fever/chills.  Spinal fusion surgery 4 days ago.  2 falls since discharge Eyes: No visual changes. No discharge ENT: No upper respiratory complaints. Cardiovascular: no chest pain. Respiratory: no cough. No SOB. Gastrointestinal: No abdominal pain.  No nausea, no vomiting.  No diarrhea.  No constipation. Genitourinary: Negative for dysuria. No hematuria Musculoskeletal: Back pain, unchanged since prior to the fall.  Sharp right hip pain, excruciating keeping the patient from ambulating,  bearing weight or even sitting on the hip. Skin: Negative for rash, abrasions, lacerations, ecchymosis. Neurological: Negative for headaches, focal weakness or numbness.  10 System ROS otherwise negative.  ____________________________________________   PHYSICAL EXAM:  VITAL SIGNS: ED Triage Vitals  Enc Vitals Group     BP 01/21/21 1618 126/70     Pulse Rate 01/21/21 1618 97     Resp 01/21/21 1618 16     Temp 01/21/21 1618 98.7 F (37.1 C)     Temp Source 01/21/21 1618 Oral     SpO2 01/21/21 1618 98 %     Weight --      Height --      Head Circumference --      Peak Flow --      Pain Score 01/21/21 1559 10     Pain Loc --      Pain Edu? --      Excl. in [place]? --      Constitutional: Alert and oriented. Well appearing and in no acute distress. Eyes: Conjunctivae are normal. PERRL. EOMI. Head: Atraumatic. ENT:      Ears:       Nose: No congestion/rhinnorhea.      Mouth/Throat: Mucous membranes are moist.  Neck: No stridor.   Hematological/Lymphatic/Immunilogical: No cervical lymphadenopathy. Cardiovascular: Normal rate, regular rhythm. Normal S1 and S2.  Good peripheral circulation. Respiratory: Normal respiratory effort without tachypnea or retractions. Lungs CTAB. Good air entry to the bases with no decreased or absent breath sounds. Gastrointestinal: Bowel sounds 4 quadrants. Soft and nontender to palpation. No guarding or rigidity. No palpable masses. No distention. No CVA tenderness. Musculoskeletal: Full range of motion to all extremities. No gross deformities appreciated.  Visualization of the right hip reveals no deformity, shortening or rotation.  Patient is tender diffusely along the lateral hip extending into the iliac crest.  There is no palpable abnormalities in this region.  Limited range of motion due to pain.  Patient is visibly uncomfortable, cannot sit or stand at this time.  Visualization of the lumbar spine reveals surgical incision with no surrounding  erythema or edema.  No evidence of excessive drainage.  Significant palpation of this region is not attempted due to the recent surgery. Neurologic:  Normal speech and language. No gross focal neurologic deficits are appreciated.  Skin:  Skin is warm,  dry and intact. No rash noted. Psychiatric: Mood and affect are normal. Speech and behavior are normal. Patient exhibits appropriate insight and judgement.   ____________________________________________   LABS (all labs ordered are listed, but only abnormal results are displayed)  Labs Reviewed  COMPREHENSIVE METABOLIC PANEL - Abnormal; Notable for the following components:      Result Value   Sodium 128 (*)    Chloride 93 (*)    Glucose, Bld 122 (*)    Creatinine, Ser 0.60 (*)    Calcium 8.5 (*)    Total Protein 6.2 (*)    Albumin 2.5 (*)    All other components within normal limits  CBC WITH DIFFERENTIAL/PLATELET - Abnormal; Notable for the following components:   RBC 3.19 (*)    Hemoglobin 9.5 (*)    HCT 27.7 (*)    RDW 18.8 (*)    Lymphs Abs 0.4 (*)    Abs Immature Granulocytes 0.27 (*)    All other components within normal limits  SARS CORONAVIRUS 2 (TAT 6-24 HRS)  URINALYSIS, COMPLETE (UACMP) WITH MICROSCOPIC  BASIC METABOLIC PANEL  CBC   ____________________________________________  EKG   ____________________________________________  RADIOLOGY I personally viewed and evaluated these images as part of my medical decision making, as well as reviewing the written report by the radiologist.  ED Provider Interpretation: X-ray revealed no acute fracture to the hip.  CT scans of the hip and back revealed no acute traumatic findings.  Findings consistent with postsurgical changes are appreciated.  Patient does have bony metastasis in the area.  But again no acute traumatic findings.  CT Lumbar Spine Wo Contrast  Result Date: 01/21/2021 CLINICAL DATA:  Initial evaluation for worsening back pain, right hip pain, recent  surgery. EXAM: CT LUMBAR SPINE WITHOUT CONTRAST TECHNIQUE: Multidetector CT imaging of the lumbar spine was performed without intravenous contrast administration. Multiplanar CT image reconstructions were also generated. COMPARISON:  MRI from 01/13/2021. FINDINGS: Segmentation: Standard. Lowest well-formed disc space labeled the L5-S1 level. Alignment: Mild sigmoid scoliotic curvature of the thoracolumbar spine. 3 mm retrolisthesis of L1 on L2. Vertebrae: There has been interval performance of vertebral augmentation for previously identified pathologic fracture at L3. No visible complicating features. Associated height loss is stable without significant interval collapse. Posterior convex bowing of the L3 vertebral body with resultant moderate spinal stenosis is grossly stable. Extraosseous extension of tumor into the adjacent right psoas musculature also grossly similar. Vertebral body height otherwise maintained with no other acute or interval fracture. Visualized sacrum intact. SI joints symmetric and normal. There is an additional lytic metastatic lesion involving the left iliac wing (series 7, image 45). No other visible osseous metastatic disease. Paraspinal and other soft tissues: Extraosseous extension of tumor into the right psoas musculature related to the L3 metastasis, grossly stable from prior MRI. Paraspinous soft tissues demonstrate no other acute finding. Advanced aorto bi-iliac atherosclerotic disease. Disc levels: L1-2: Trace retrolisthesis with mild disc bulge. No spinal stenosis. Foramina remain patent. L2-3: Mild disc bulge with facet hypertrophy. No spinal stenosis. Foramina remain patent. L3-4: Degenerative intervertebral disc space narrowing with diffuse disc bulge. Mild facet hypertrophy. Moderate to advanced spinal stenosis related to posterior convex bowing related to the pathologic L3 compression fracture noted, grossly similar. Moderate to severe right with mild left L3 foraminal  stenosis, grossly stable. L4-5: Mild disc bulge with facet hypertrophy. No spinal stenosis. No more than mild bilateral L4 foraminal narrowing. L5-S1: Mild disc bulge. Right greater than left facet arthrosis. No spinal stenosis.  Foramina remain patent. IMPRESSION: 1. Interval performance of vertebral augmentation for previously identified pathologic fracture at L3. No visible complication. Associated height loss is stable without further interval collapse. Posterior convex bowing of the L3 vertebral body with resultant moderate to advanced spinal stenosis, similar to previous. Moderate to severe right L3 foraminal stenosis also grossly stable. 2. Additional lytic metastatic lesion involving the left iliac wing. 3. No other acute abnormality within the lumbar spine. 4. Aortic Atherosclerosis (ICD10-I70.0). Electronically Signed   By: Jeannine Boga M.D.   On: 01/21/2021 20:40   CT Hip Right Wo Contrast  Result Date: 01/21/2021 CLINICAL DATA:  Fall, right hip pain EXAM: CT OF THE RIGHT HIP WITHOUT CONTRAST TECHNIQUE: Multidetector CT imaging of the right hip was performed according to the standard protocol. Multiplanar CT image reconstructions were also generated. COMPARISON:  X-ray 01/21/2021 FINDINGS: Bones/Joint/Cartilage Right hip is intact without fracture or dislocation. No evidence of avascular necrosis of the femoral head. Right hip joint space is maintained. No hip joint effusion is evident. Visualized portion of the right hemipelvis is intact. Pubic symphysis and right sacroiliac joint intact without diastasis. No lytic or sclerotic bony lesions are identified. Site of known right iliac crest metastatic lesion is not included within the field of view on the current study. Ligaments Suboptimally assessed by CT. Muscles and Tendons No acute musculotendinous abnormality by CT. Soft tissues Mild subcutaneous edema overlying the greater trochanter. No organized fluid collection or hematoma. No  abnormally enlarged right inguinal lymph nodes. Bilateral hydroceles. Atherosclerotic vascular calcifications are present. IMPRESSION: 1. No acute fracture or dislocation of the right hip. 2. Bilateral hydroceles. Electronically Signed   By: Davina Poke D.O.   On: 01/21/2021 20:22   DG Hip Unilat  With Pelvis 2-3 Views Right  Result Date: 01/21/2021 CLINICAL DATA:  Right hip pain. History of metastatic non-small cell lung cancer EXAM: DG HIP (WITH OR WITHOUT PELVIS) 2-3V RIGHT COMPARISON:  MRI pelvis 01/13/2021 FINDINGS: No acute fracture or dislocation. Right hip joint space is preserved. Known destructive lesion at the left iliac crest is not well seen, partially obscured by overlying stool and bowel gas. Small marrow replacing lesion of the right iliac crest seen on recent MRI is not well demonstrated radiographically. Otherwise, no evidence of a new destructive bone lesion. IMPRESSION: 1. No acute fracture or dislocation. 2. Known metastatic lesions within the bilateral iliac crests are not well seen radiographically. Electronically Signed   By: Davina Poke D.O.   On: 01/21/2021 18:25    ____________________________________________    PROCEDURES  Procedure(s) performed:    Procedures    Medications  aspirin EC tablet 81 mg (has no administration in time range)  fentaNYL (DURAGESIC) 50 MCG/HR 1 patch (has no administration in time range)  Oxycodone HCl TABS 10 mg (has no administration in time range)  atorvastatin (LIPITOR) tablet 80 mg (has no administration in time range)  ezetimibe (ZETIA) tablet 10 mg (has no administration in time range)  icosapent Ethyl (VASCEPA) 1 g capsule 2 g (has no administration in time range)  isosorbide mononitrate (IMDUR) 24 hr tablet 30 mg (has no administration in time range)  metoprolol tartrate (LOPRESSOR) tablet 25 mg (has no administration in time range)  dicyclomine (BENTYL) capsule 10 mg (has no administration in time range)   senna-docusate (Senokot-S) tablet 2 tablet (has no administration in time range)  sucralfate (CARAFATE) tablet 0.5 g (has no administration in time range)  vitamin B-12 (CYANOCOBALAMIN) tablet 1,000 mcg (has no administration  in time range)  naloxone [facility]) injection 0.4 mg (has no administration in time range)  gabapentin (NEURONTIN) capsule 100 mg (has no administration in time range)  cyclobenzaprine (FLEXERIL) tablet 5 mg (has no administration in time range)  feeding supplement (ENSURE ENLIVE / ENSURE PLUS) liquid 237 mL (has no administration in time range)  Icy Hot 09-32 % STCK 1 application (has no administration in time range)  enoxaparin (LOVENOX) injection 40 mg (has no administration in time range)  0.9 %  sodium chloride infusion (has no administration in time range)  acetaminophen (TYLENOL) tablet 650 mg (has no administration in time range)    Or  acetaminophen (TYLENOL) suppository 650 mg (has no administration in time range)  traZODone (DESYREL) tablet 25 mg (has no administration in time range)  magnesium hydroxide (MILK OF MAGNESIA) suspension 30 mL (has no administration in time range)  ondansetron (ZOFRAN) tablet 4 mg (has no administration in time range)    Or  ondansetron (ZOFRAN) injection 4 mg (has no administration in time range)  oxyCODONE (Oxy IR/ROXICODONE) immediate release tablet 10 mg (10 mg Oral Given 01/21/21 1812)  ondansetron (ZOFRAN) injection 4 mg (4 mg Intravenous Given 01/21/21 2001)  morphine 4 MG/ML injection 4 mg (4 mg Intravenous Given 01/21/21 2001)  sodium chloride 0.9 % bolus 1,000 mL (1,000 mLs Intravenous New Bag/Given 01/21/21 2216)  HYDROmorphone (DILAUDID) injection 0.5 mg (0.5 mg Intravenous Given 01/21/21 2208)     ____________________________________________   INITIAL IMPRESSION / ASSESSMENT AND PLAN / ED COURSE  Pertinent labs & imaging results that were available during my care of the patient were reviewed by me and considered in  my medical decision making (see chart for details).  Review of the [place] CSRS was performed in accordance of the [place] prior to dispensing any controlled drugs.           Patient's diagnosis is consistent with intractable pain.  Patient presents emergency department after several falls following his lumbar fusion 4 days ago.  These were mechanical in nature and patient landed on his hip.  He is having severe right hip pain at this time unrelieved by his at home narcotics.  Escalating pain medication from oxycodone to morphine to Dilaudid did not see an improvement in patient's symptoms.  Pain is severe enough that he cannot stand or sit at this time.  This is a significant change for the patient.  Imaging did not reveal any acute traumatic findings.  He did not hit his head or lose consciousness.  Labs are stable at this time.  Patient does have some hyponatremia, anemia but again these are at patient's baseline levels.  Patient will be admitted at this time for intractable pain.  I discussed the patient with the hospitalist who agrees to admit for observation at this time..      ____________________________________________  FINAL CLINICAL IMPRESSION(S) / ED DIAGNOSES  Final diagnoses:  Intractable pain  Multiple falls  Hyponatremia      NEW MEDICATIONS STARTED DURING THIS VISIT:  ED Discharge Orders     None           This chart was dictated using voice recognition software/Dragon. Despite best efforts to proofread, errors can occur which can change the meaning. Any change was purely unintentional.    Darletta Moll, PA-C 01/21/21 2311    Carrie Mew, MD 01/22/21 2310

## 2021-01-22 ENCOUNTER — Encounter: Payer: Self-pay | Admitting: Family Medicine

## 2021-01-22 DIAGNOSIS — M25551 Pain in right hip: Secondary | ICD-10-CM | POA: Diagnosis not present

## 2021-01-22 LAB — CBC
HCT: 24.9 % — ABNORMAL LOW (ref 39.0–52.0)
Hemoglobin: 8.3 g/dL — ABNORMAL LOW (ref 13.0–17.0)
MCH: 29.1 pg (ref 26.0–34.0)
MCHC: 33.3 g/dL (ref 30.0–36.0)
MCV: 87.4 fL (ref 80.0–100.0)
Platelets: 239 10*3/uL (ref 150–400)
RBC: 2.85 MIL/uL — ABNORMAL LOW (ref 4.22–5.81)
RDW: 18.7 % — ABNORMAL HIGH (ref 11.5–15.5)
WBC: 6.2 10*3/uL (ref 4.0–10.5)
nRBC: 0 % (ref 0.0–0.2)

## 2021-01-22 LAB — BASIC METABOLIC PANEL
Anion gap: 10 (ref 5–15)
BUN: 12 mg/dL (ref 8–23)
CO2: 25 mmol/L (ref 22–32)
Calcium: 8.2 mg/dL — ABNORMAL LOW (ref 8.9–10.3)
Chloride: 96 mmol/L — ABNORMAL LOW (ref 98–111)
Creatinine, Ser: 0.63 mg/dL (ref 0.61–1.24)
GFR, Estimated: >60 mL/min (ref >60)
Glucose, Bld: 97 mg/dL (ref 70–99)
Potassium: 3.8 mmol/L (ref 3.5–5.1)
Sodium: 131 mmol/L — ABNORMAL LOW (ref 135–145)

## 2021-01-22 LAB — SARS CORONAVIRUS 2 (TAT 6-24 HRS): SARS Coronavirus 2: NEGATIVE

## 2021-01-22 LAB — CK: Total CK: 72 U/L (ref 49–397)

## 2021-01-22 MED ORDER — HYDROMORPHONE HCL 1 MG/ML IJ SOLN: 0.5000 mg | INTRAMUSCULAR | Status: DC | PRN

## 2021-01-22 MED ORDER — GABAPENTIN 300 MG PO CAPS
300.0000 mg | ORAL_CAPSULE | Freq: Three times a day (TID) | ORAL | Status: DC
  Administered 2021-01-22 – 2021-01-23 (×4): 300 mg via ORAL
  Filled 2021-01-22: qty 3
  Filled 2021-01-22 (×3): qty 1

## 2021-01-22 MED ORDER — SODIUM CHLORIDE 0.9 % IV BOLUS
500.0000 mL | Freq: Once | INTRAVENOUS | Status: AC
  Administered 2021-01-22: 500 mL via INTRAVENOUS

## 2021-01-22 MED ORDER — ACETAMINOPHEN 500 MG PO TABS
1000.0000 mg | ORAL_TABLET | Freq: Three times a day (TID) | ORAL | Status: DC
  Administered 2021-01-22 – 2021-02-04 (×39): 1000 mg via ORAL
  Filled 2021-01-22 (×39): qty 2

## 2021-01-22 MED ORDER — KETOROLAC TROMETHAMINE 15 MG/ML IJ SOLN
15.0000 mg | Freq: Four times a day (QID) | INTRAMUSCULAR | Status: AC
  Administered 2021-01-22 – 2021-01-27 (×20): 15 mg via INTRAVENOUS
  Filled 2021-01-22 (×20): qty 1

## 2021-01-22 NOTE — Progress Notes (Signed)
   01/22/21 0700  Clinical Encounter Type  Visited With Patient  Visit Type Initial;Spiritual support;Social support  Referral From Nurse  Consult/Referral To Lauretta Chester was able to speak to Mr. Grosser, and offer emotional and spiritual support. PT was able to express his thoughts and feelings. Chaplain ministered with peaceful presence, and prayer.   Andee Poles, M. Div.

## 2021-01-22 NOTE — Progress Notes (Signed)
PROGRESS NOTE    Donald Lowery  ZDG:[id number] DOB: 27-May-1950 DOA: 01/21/2021 PCP: [name], NP    Brief Narrative:  70 y.o. 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  The patient underwent l kyphoplasty for pathological L3 closed wedge compression fracture 3 days ago.  Over the last few days he sustained mechanical falls on his right hip.  Today he was not able to stand on or to sit from pain.  He denies any presyncope or syncope.  No headache or dizziness or blurred vision.  No paresthesias or focal muscle weakness.  CT imaging survey of lumbar spine and right hip negative for fracture or dislocation.   Assessment & Plan:   Active Problems:   Right hip pain  Right hip pain Recurrent mechanical falls Inability to ambulate Patient status post kyphoplasty and lumbar fusion X-ray right hip negative for fracture or dislocation Pain likely pathologic secondary to metastatic lung cancer Plan: Multimodal pain control Physical therapy evaluation Consider orthopedic evaluation if pain control difficult to achieve though not likely patient will be a candidate for much more intervention  Hyponatremia Appears chronic and asymptomatic Continue to monitor  Small cell lung cancer stage IV Diffuse pain secondary to above Patient under treatment with oncology Has known bony metastases and diffuse pain secondary to this Plan: Multimodal pain control Consider oncology or palliative care evaluation during hospitalization  Hyperlipidemia Will hold statin for now Myalgias may be contributing to diffuse pain Plan: DC Lipitor Check  a.m. lipid panel Check serum CK  Coronary artery disease Continue Imdur Lopressor and aspirin Statin on hold as above  Vitamin B12 deficiency PTA B12 supplementation  GERD PTA Carafate  DVT prophylaxis: SQ Lovenox Code Status: Full Family Communication: None today.  Offered to call but the patient declined Disposition Plan:Status is: Observation  The patient will require care spanning > 2 midnights and should be moved to inpatient because: Ongoing active pain requiring inpatient pain management and Inpatient level of care appropriate due to severity of illness  Dispo: The patient is from: Home              Anticipated d/c is to:  TBD              Patient currently is not medically stable to d/c.   Difficult to place patient No       Level of care: Med-Surg  Consultants:  None  Procedures:  None  Antimicrobials:  None   Subjective: Seen and examined.  Endorses significant pain in right hip.  Objective: Vitals:   01/21/21 2331 01/22/21 0514 01/22/21 0748 01/22/21 0931  BP: 125/71 125/67 120/71 112/60  Pulse: 91 72 86 89  Resp: 16 16 17    Temp: 97.7 F (36.5 C) 97.8 F (36.6 C) 98.2 F (36.8 C)   TempSrc: Oral  Oral   SpO2: 93% 99% 99%     Intake/Output Summary (Last 24 hours) at 01/22/2021 1036 Last data filed at 01/22/2021 1030 Gross per 24 hour  Intake 1053.93 ml  Output 250 ml  Net 803.93 ml   There were no vitals filed for this visit.  Examination:  General exam: Mild distress due to pain Respiratory system: Lungs  clear.  Normal work of breathing.  Room air Cardiovascular system: S1-S2, RRR, no murmurs, no pedal edema Gastrointestinal system: Thin, nontender, nondistended, normal bowel sounds Central nervous system: Alert and oriented. No focal neurological deficits. Extremities: Decreased power RLE Skin: No rashes, lesions or ulcers Psychiatry: Judgement and insight appear normal. Mood & affect appropriate.     Data Reviewed: I have  personally reviewed following labs and imaging studies  CBC: Recent Labs  Lab 01/16/21 0617 01/21/21 2001 01/22/21 0444  WBC 4.5 6.8 6.2  NEUTROABS  --  5.7  --   HGB 8.3* 9.5* 8.3*  HCT 24.0* 27.7* 24.9*  MCV 87.9 86.8 87.4  PLT 223 263 517   Basic Metabolic Panel: Recent Labs  Lab 01/16/21 0617 01/17/21 0436 01/18/21 0415 01/19/21 0444 01/21/21 2001 01/22/21 0444  NA 127* 129* 128* 128* 128* 131*  K 4.6 4.5 4.2 4.4 4.0 3.8  CL 93* 92* 92* 91* 93* 96*  CO2 29 31 30 30 28 25   GLUCOSE 182* 171* 135* 203* 122* 97  BUN 15 16 13 15 14 12   CREATININE 0.62 0.47* 0.53* 0.57* 0.60* 0.63  CALCIUM 8.7* 8.8* 8.4* 9.0 8.5* 8.2*  MG 1.8 1.8 1.8 1.8  --   --    GFR: Estimated Creatinine Clearance: 63.5 mL/min (by C-G formula based on SCr of 0.63 mg/dL). Liver Function Tests: Recent Labs  Lab 01/21/21 2001  AST 23  ALT 18  ALKPHOS 116  BILITOT 1.0  PROT 6.2*  ALBUMIN 2.5*   No results for input(s): LIPASE, AMYLASE in the last 168 hours. No results for input(s): AMMONIA in the last 168 hours. Coagulation Profile: No results for input(s): INR, PROTIME in the last 168 hours. Cardiac Enzymes: No results for input(s): CKTOTAL, CKMB, CKMBINDEX, TROPONINI in the last 168 hours. BNP (last 3 results) No results for input(s): PROBNP in the last 8760 hours. HbA1C: No results for input(s): HGBA1C in the last 72 hours. CBG: Recent Labs  Lab 01/16/21 0612 01/17/21 0540 01/17/21 1456 01/17/21 1626 01/18/21 0743  GLUCAP 165* 149* 119* 128* 130*   Lipid Profile: No results for input(s): CHOL, HDL, LDLCALC, TRIG, CHOLHDL, LDLDIRECT in the last 72 hours. Thyroid Function Tests: No results for input(s): TSH, T4TOTAL, FREET4, T3FREE, THYROIDAB in the last 72 hours. Anemia Panel: No results for input(s): VITAMINB12, FOLATE, FERRITIN, TIBC, IRON, RETICCTPCT in the last 72 hours. Sepsis Labs: No results for input(s): PROCALCITON, LATICACIDVEN in the last 168 hours.  Recent  Results (from the past 240 hour(s))  Resp Panel by RT-PCR (Flu A&B, Covid) Nasopharyngeal Swab     Status: None   Collection Time: 01/12/21  6:16 PM   Specimen: Nasopharyngeal Swab; Nasopharyngeal(NP) swabs in vial transport medium  Result Value Ref Range Status   SARS Coronavirus 2 by RT PCR NEGATIVE NEGATIVE Final    Comment: (NOTE) SARS-CoV-2 target nucleic acids are NOT DETECTED.  The SARS-CoV-2 RNA is generally detectable in upper respiratory specimens during the acute phase of infection. The lowest concentration of SARS-CoV-2 viral copies this assay can detect is 138 copies/mL. A negative result does not preclude SARS-Cov-2 infection and should not be used as the sole basis for treatment or other patient management decisions. A negative result may occur with  improper specimen collection/handling, submission of specimen other than nasopharyngeal swab, presence of viral mutation(s) within the areas targeted by this assay, and inadequate number of viral copies(<138 copies/mL). A negative result must be combined with clinical observations, patient history, and epidemiological information.  The expected result is Negative.  Fact Sheet for Patients:  EntrepreneurPulse.com.au  Fact Sheet for Healthcare Providers:  IncredibleEmployment.be  This test is no t yet approved or cleared by the [place] FDA and  has been authorized for detection and/or diagnosis of SARS-CoV-2 by FDA under an Emergency Use Authorization (EUA). This EUA will remain  in effect (meaning this test can be used) for the duration of the COVID-19 declaration under Section 564(b)(1) of the Act, 21 U.S.C.section 360bbb-3(b)(1), unless the authorization is terminated  or revoked sooner.       Influenza A by PCR NEGATIVE NEGATIVE Final   Influenza B by PCR NEGATIVE NEGATIVE Final    Comment: (NOTE) The Xpert Xpress SARS-CoV-2/FLU/RSV plus assay is intended as an aid in the  diagnosis of influenza from Nasopharyngeal swab specimens and should not be used as a sole basis for treatment. Nasal washings and aspirates are unacceptable for Xpert Xpress SARS-CoV-2/FLU/RSV testing.  Fact Sheet for Patients: EntrepreneurPulse.com.au  Fact Sheet for Healthcare Providers: IncredibleEmployment.be  This test is not yet approved or cleared by the [place] FDA and has been authorized for detection and/or diagnosis of SARS-CoV-2 by FDA under an Emergency Use Authorization (EUA). This EUA will remain in effect (meaning this test can be used) for the duration of the COVID-19 declaration under Section 564(b)(1) of the Act, 21 U.S.C. section 360bbb-3(b)(1), unless the authorization is terminated or revoked.  Performed at [facility], [place], [place] [address]   SARS CORONAVIRUS 2 (TAT 6-24 HRS) Nasopharyngeal Nasopharyngeal Swab     Status: None   Collection Time: 01/21/21  8:01 PM   Specimen: Nasopharyngeal Swab  Result Value Ref Range Status   SARS Coronavirus 2 NEGATIVE NEGATIVE Final    Comment: (NOTE) SARS-CoV-2 target nucleic acids are NOT DETECTED.  The SARS-CoV-2 RNA is generally detectable in upper and lower respiratory specimens during the acute phase of infection. Negative results do not preclude SARS-CoV-2 infection, do not rule out co-infections with other pathogens, and should not be used as the sole basis for treatment or other patient management decisions. Negative results must be combined with clinical observations, patient history, and epidemiological information. The expected result is Negative.  Fact Sheet for Patients: SugarRoll.be  Fact Sheet for Healthcare Providers: https://www.woods-mathews.com/  This test is not yet approved or cleared by the [place] FDA and  has been authorized for detection and/or diagnosis of SARS-CoV-2  by FDA under an Emergency Use Authorization (EUA). This EUA will remain  in effect (meaning this test can be used) for the duration of the COVID-19 declaration under Se ction 564(b)(1) of the Act, 21 U.S.C. section 360bbb-3(b)(1), unless the authorization is terminated or revoked sooner.  Performed at [facility], [place] [address] [place], [place] [address]          Radiology Studies: CT Lumbar Spine Wo Contrast  Result Date: 01/21/2021 CLINICAL DATA:  Initial evaluation for worsening back pain, right hip pain, recent surgery. EXAM: CT LUMBAR SPINE WITHOUT CONTRAST TECHNIQUE: Multidetector CT imaging of the lumbar spine was performed without intravenous contrast administration. Multiplanar CT image reconstructions were also generated. COMPARISON:  MRI from 01/13/2021. FINDINGS: Segmentation: Standard. Lowest well-formed disc space labeled the L5-S1 level. Alignment: Mild sigmoid scoliotic curvature of the thoracolumbar spine. 3 mm retrolisthesis of L1 on L2. Vertebrae: There has been interval performance of vertebral augmentation for previously identified pathologic fracture at L3. No visible complicating features. Associated height loss is stable without significant  interval collapse. Posterior convex bowing of the L3 vertebral body with resultant moderate spinal stenosis is grossly stable. Extraosseous extension of tumor into the adjacent right psoas musculature also grossly similar. Vertebral body height otherwise maintained with no other acute or interval fracture. Visualized sacrum intact. SI joints symmetric and normal. There is an additional lytic metastatic lesion involving the left iliac wing (series 7, image 45). No other visible osseous metastatic disease. Paraspinal and other soft tissues: Extraosseous extension of tumor into the right psoas musculature related to the L3 metastasis, grossly stable from prior MRI. Paraspinous soft tissues demonstrate no other acute finding.  Advanced aorto bi-iliac atherosclerotic disease. Disc levels: L1-2: Trace retrolisthesis with mild disc bulge. No spinal stenosis. Foramina remain patent. L2-3: Mild disc bulge with facet hypertrophy. No spinal stenosis. Foramina remain patent. L3-4: Degenerative intervertebral disc space narrowing with diffuse disc bulge. Mild facet hypertrophy. Moderate to advanced spinal stenosis related to posterior convex bowing related to the pathologic L3 compression fracture noted, grossly similar. Moderate to severe right with mild left L3 foraminal stenosis, grossly stable. L4-5: Mild disc bulge with facet hypertrophy. No spinal stenosis. No more than mild bilateral L4 foraminal narrowing. L5-S1: Mild disc bulge. Right greater than left facet arthrosis. No spinal stenosis. Foramina remain patent. IMPRESSION: 1. Interval performance of vertebral augmentation for previously identified pathologic fracture at L3. No visible complication. Associated height loss is stable without further interval collapse. Posterior convex bowing of the L3 vertebral body with resultant moderate to advanced spinal stenosis, similar to previous. Moderate to severe right L3 foraminal stenosis also grossly stable. 2. Additional lytic metastatic lesion involving the left iliac wing. 3. No other acute abnormality within the lumbar spine. 4. Aortic Atherosclerosis (ICD10-I70.0). Electronically Signed   By: Jeannine Boga M.D.   On: 01/21/2021 20:40   CT Hip Right Wo Contrast  Result Date: 01/21/2021 CLINICAL DATA:  Fall, right hip pain EXAM: CT OF THE RIGHT HIP WITHOUT CONTRAST TECHNIQUE: Multidetector CT imaging of the right hip was performed according to the standard protocol. Multiplanar CT image reconstructions were also generated. COMPARISON:  X-ray 01/21/2021 FINDINGS: Bones/Joint/Cartilage Right hip is intact without fracture or dislocation. No evidence of avascular necrosis of the femoral head. Right hip joint space is maintained. No  hip joint effusion is evident. Visualized portion of the right hemipelvis is intact. Pubic symphysis and right sacroiliac joint intact without diastasis. No lytic or sclerotic bony lesions are identified. Site of known right iliac crest metastatic lesion is not included within the field of view on the current study. Ligaments Suboptimally assessed by CT. Muscles and Tendons No acute musculotendinous abnormality by CT. Soft tissues Mild subcutaneous edema overlying the greater trochanter. No organized fluid collection or hematoma. No abnormally enlarged right inguinal lymph nodes. Bilateral hydroceles. Atherosclerotic vascular calcifications are present. IMPRESSION: 1. No acute fracture or dislocation of the right hip. 2. Bilateral hydroceles. Electronically Signed   By: Davina Poke D.O.   On: 01/21/2021 20:22   DG Hip Unilat  With Pelvis 2-3 Views Right  Result Date: 01/21/2021 CLINICAL DATA:  Right hip pain. History of metastatic non-small cell lung cancer EXAM: DG HIP (WITH OR WITHOUT PELVIS) 2-3V RIGHT COMPARISON:  MRI pelvis 01/13/2021 FINDINGS: No acute fracture or dislocation. Right hip joint space is preserved. Known destructive lesion at the left iliac crest is not well seen, partially obscured by overlying stool and bowel gas. Small marrow replacing lesion of the right iliac crest seen on recent MRI is not well demonstrated  radiographically. Otherwise, no evidence of a new destructive bone lesion. IMPRESSION: 1. No acute fracture or dislocation. 2. Known metastatic lesions within the bilateral iliac crests are not well seen radiographically. Electronically Signed   By: Davina Poke D.O.   On: 01/21/2021 18:25        Scheduled Meds:  acetaminophen  1,000 mg Oral TID   aspirin EC  81 mg Oral Daily   atorvastatin  80 mg Oral q1800   enoxaparin (LOVENOX) injection  40 mg Subcutaneous Q24H   ezetimibe  10 mg Oral Daily   feeding supplement  237 mL Oral TID BM   fentaNYL  1 patch  Transdermal Q72H   gabapentin  300 mg Oral TID   icosapent Ethyl  2 g Oral BID   isosorbide mononitrate  30 mg Oral Daily   ketorolac  15 mg Intravenous Q6H   metoprolol tartrate  25 mg Oral BID   sucralfate  0.5 g Oral TID AC   vitamin B-12  1,000 mcg Oral Daily   Continuous Infusions:  sodium chloride 100 mL/hr at 01/22/21 0749     LOS: 0 days    Time spent: 35 minutes    Sidney Ace, MD [facility] Pager [phone]  If 7PM-7AM, please contact night-coverage 01/22/2021, 10:36 AM

## 2021-01-22 NOTE — Progress Notes (Addendum)
Patient's BP low. Dr. Priscella Mann notified. Bolus given. BP recheck 98/55, Pulse 67. MD notified of BP after bolus. Continuous IVF changed to 75cc/ml from 50cc/hr. MD states "with fentyl patch and pt just laying in bed that his BP and bound to be low. Go ahead and give meds." Scheduled Ketoralac given. BP 103/50, pulse 73 about an hour after IVF rate change and scheduled pain med.

## 2021-01-22 NOTE — Progress Notes (Signed)
Attempted x 2 for PIV. Unsuccessful.  Primary RN notified.

## 2021-01-22 NOTE — TOC Progression Note (Addendum)
Transition of Care [facility]) - Progression Note    Patient Details  Name: Donald Lowery MRN: [id number] Date of Birth: Mar 08, 1951  Transition of Care [facility]) CM/SW [place], RN Phone Number: 01/22/2021, 10:54 AM  Clinical Narrative:  Met with the patient and his wife in the room at the bedside.  The patient had Kyphoplasty a few days ago and stated that his pain has not decreased, He fell at home, He stated that he is in so much pain he can not get up at all.  I notified the nurse that he is complaining of pain, the Bedside Nurse reports that he has been given pain meds and has a Pain Patch in place, she will continue to monitor for pain and what can be given, He is to work with PT to determine next steps, He does have a RW and a 3 in 1 at home, He has seen [facility] PT on Sat prior to falling, He can't remember the name of the agency but stated the PT name is Amy.  I reached out to [facility] to determine if they are Open with the patient for [facility], Awaiting a call back from [facility] confirm, TOC will continue to monitor for needs and DC plan  The patient is open with [facility] for [facility] services       Expected Discharge Plan and Services                                                 Social Determinants of Health (SDOH) Interventions    Readmission Risk Interventions Readmission Risk Prevention Plan 01/15/2021  Transportation Screening Complete  PCP or Specialist Appt within 3-5 Days Complete  Social Work Consult for [place] Planning/Counseling Complete  Palliative Care Screening Not Applicable  Medication Review Press photographer) Complete  Some recent data might be hidden

## 2021-01-23 ENCOUNTER — Ambulatory Visit: Payer: Medicare HMO

## 2021-01-23 ENCOUNTER — Inpatient Hospital Stay: Payer: Medicare HMO | Admitting: Oncology

## 2021-01-23 DIAGNOSIS — M5459 Other low back pain: Secondary | ICD-10-CM | POA: Diagnosis not present

## 2021-01-23 DIAGNOSIS — C3490 Malignant neoplasm of unspecified part of unspecified bronchus or lung: Secondary | ICD-10-CM | POA: Diagnosis not present

## 2021-01-23 DIAGNOSIS — C7951 Secondary malignant neoplasm of bone: Secondary | ICD-10-CM | POA: Diagnosis present

## 2021-01-23 DIAGNOSIS — E871 Hypo-osmolality and hyponatremia: Secondary | ICD-10-CM | POA: Diagnosis present

## 2021-01-23 DIAGNOSIS — J438 Other emphysema: Secondary | ICD-10-CM | POA: Diagnosis present

## 2021-01-23 DIAGNOSIS — M5442 Lumbago with sciatica, left side: Secondary | ICD-10-CM | POA: Diagnosis not present

## 2021-01-23 DIAGNOSIS — Z681 Body mass index (BMI) 19 or less, adult: Secondary | ICD-10-CM | POA: Diagnosis not present

## 2021-01-23 DIAGNOSIS — R52 Pain, unspecified: Secondary | ICD-10-CM | POA: Diagnosis not present

## 2021-01-23 DIAGNOSIS — E43 Unspecified severe protein-calorie malnutrition: Secondary | ICD-10-CM | POA: Diagnosis present

## 2021-01-23 DIAGNOSIS — E1142 Type 2 diabetes mellitus with diabetic polyneuropathy: Secondary | ICD-10-CM | POA: Diagnosis present

## 2021-01-23 DIAGNOSIS — Z515 Encounter for palliative care: Secondary | ICD-10-CM | POA: Diagnosis not present

## 2021-01-23 DIAGNOSIS — Z5320 Procedure and treatment not carried out because of patient's decision for unspecified reasons: Secondary | ICD-10-CM | POA: Diagnosis present

## 2021-01-23 DIAGNOSIS — M5441 Lumbago with sciatica, right side: Secondary | ICD-10-CM | POA: Diagnosis not present

## 2021-01-23 DIAGNOSIS — I252 Old myocardial infarction: Secondary | ICD-10-CM | POA: Diagnosis not present

## 2021-01-23 DIAGNOSIS — K219 Gastro-esophageal reflux disease without esophagitis: Secondary | ICD-10-CM | POA: Diagnosis present

## 2021-01-23 DIAGNOSIS — D649 Anemia, unspecified: Secondary | ICD-10-CM | POA: Diagnosis not present

## 2021-01-23 DIAGNOSIS — Z66 Do not resuscitate: Secondary | ICD-10-CM | POA: Diagnosis not present

## 2021-01-23 DIAGNOSIS — I2510 Atherosclerotic heart disease of native coronary artery without angina pectoris: Secondary | ICD-10-CM | POA: Diagnosis present

## 2021-01-23 DIAGNOSIS — I10 Essential (primary) hypertension: Secondary | ICD-10-CM | POA: Diagnosis present

## 2021-01-23 DIAGNOSIS — W010XXA Fall on same level from slipping, tripping and stumbling without subsequent striking against object, initial encounter: Secondary | ICD-10-CM | POA: Diagnosis present

## 2021-01-23 DIAGNOSIS — Z8616 Personal history of COVID-19: Secondary | ICD-10-CM | POA: Diagnosis not present

## 2021-01-23 DIAGNOSIS — K760 Fatty (change of) liver, not elsewhere classified: Secondary | ICD-10-CM | POA: Diagnosis present

## 2021-01-23 DIAGNOSIS — F419 Anxiety disorder, unspecified: Secondary | ICD-10-CM | POA: Diagnosis not present

## 2021-01-23 DIAGNOSIS — C3492 Malignant neoplasm of unspecified part of left bronchus or lung: Secondary | ICD-10-CM | POA: Diagnosis not present

## 2021-01-23 DIAGNOSIS — E785 Hyperlipidemia, unspecified: Secondary | ICD-10-CM | POA: Diagnosis present

## 2021-01-23 DIAGNOSIS — R296 Repeated falls: Secondary | ICD-10-CM | POA: Diagnosis present

## 2021-01-23 DIAGNOSIS — G893 Neoplasm related pain (acute) (chronic): Secondary | ICD-10-CM | POA: Diagnosis present

## 2021-01-23 DIAGNOSIS — M25551 Pain in right hip: Secondary | ICD-10-CM | POA: Diagnosis not present

## 2021-01-23 DIAGNOSIS — D638 Anemia in other chronic diseases classified elsewhere: Secondary | ICD-10-CM | POA: Diagnosis present

## 2021-01-23 DIAGNOSIS — Z7189 Other specified counseling: Secondary | ICD-10-CM | POA: Diagnosis not present

## 2021-01-23 DIAGNOSIS — E538 Deficiency of other specified B group vitamins: Secondary | ICD-10-CM | POA: Diagnosis present

## 2021-01-23 DIAGNOSIS — L89152 Pressure ulcer of sacral region, stage 2: Secondary | ICD-10-CM | POA: Diagnosis present

## 2021-01-23 DIAGNOSIS — I700 Atherosclerosis of aorta: Secondary | ICD-10-CM | POA: Diagnosis present

## 2021-01-23 DIAGNOSIS — C3412 Malignant neoplasm of upper lobe, left bronchus or lung: Secondary | ICD-10-CM | POA: Diagnosis present

## 2021-01-23 LAB — LIPID PANEL
Cholesterol: 66 mg/dL (ref 0–200)
HDL: 21 mg/dL — ABNORMAL LOW (ref >40)
LDL Cholesterol: 33 mg/dL (ref 0–99)
Total CHOL/HDL Ratio: 3.1 RATIO
Triglycerides: 60 mg/dL (ref <150)
VLDL: 12 mg/dL (ref 0–40)

## 2021-01-23 MED ORDER — BISACODYL 10 MG RE SUPP
10.0000 mg | Freq: Every day | RECTAL | Status: DC | PRN
  Administered 2021-01-24: 10 mg via RECTAL
  Filled 2021-01-23: qty 1

## 2021-01-23 MED ORDER — HYDROMORPHONE HCL 1 MG/ML IJ SOLN: 1.0000 mg | INTRAMUSCULAR | Status: DC | PRN

## 2021-01-23 MED ORDER — POLYETHYLENE GLYCOL 3350 17 G PO PACK
17.0000 g | PACK | Freq: Every day | ORAL | Status: DC
  Administered 2021-01-23 – 2021-02-04 (×12): 17 g via ORAL
  Filled 2021-01-23 (×13): qty 1

## 2021-01-23 MED ORDER — GABAPENTIN 400 MG PO CAPS
400.0000 mg | ORAL_CAPSULE | Freq: Three times a day (TID) | ORAL | Status: DC
  Administered 2021-01-23 (×2): 400 mg via ORAL
  Filled 2021-01-23 (×2): qty 1

## 2021-01-23 MED ORDER — SENNOSIDES-DOCUSATE SODIUM 8.6-50 MG PO TABS
1.0000 | ORAL_TABLET | Freq: Two times a day (BID) | ORAL | Status: DC
  Administered 2021-01-23 – 2021-01-26 (×8): 1 via ORAL
  Filled 2021-01-23 (×8): qty 1

## 2021-01-23 MED ORDER — CYCLOBENZAPRINE HCL 10 MG PO TABS
10.0000 mg | ORAL_TABLET | Freq: Three times a day (TID) | ORAL | Status: DC
  Administered 2021-01-23 (×2): 10 mg via ORAL
  Filled 2021-01-23 (×2): qty 1

## 2021-01-23 MED ORDER — ADULT MULTIVITAMIN W/MINERALS CH
1.0000 | ORAL_TABLET | Freq: Every day | ORAL | Status: DC
  Administered 2021-01-23 – 2021-02-04 (×13): 1 via ORAL
  Filled 2021-01-23 (×13): qty 1

## 2021-01-23 NOTE — Progress Notes (Signed)
OT Cancellation Note  Patient Details Name: Donald Lowery MRN: [id number] DOB: [birth year]   Cancelled Treatment:    Reason Eval/Treat Not Completed: Other (comment). Consult received, chart reviewed. Pt endorsing continued severe pain, requesting OT tell the RN (RN and MD notified via secure chat). Pt reports going to chemo soon. Lunch tray arrived. Pt agreeable to OT re-attempt evaluation at later time as pt is available.  Ardeth Perfect., MPH, MS, OTR/L ascom [phone] 01/23/21, 2:00 PM

## 2021-01-23 NOTE — Evaluation (Signed)
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   Clinical Impression  Pt admitted with above diagnosis. Pt agreeable to PT intervention with spouse present in room. Pt HoH but able to answer PLOF, DME, house set up, etc. Without difficulty. Spouse reporting available 24/7 for assist if pt to d/c home but unable to perform any physical assist to pt. Pt recently at hospital (d/c on 9/22) and returns due to 2 mechanical falls at home environment with R hip pain. Pt reporting this is the same pain he had at previous admission and it is NOT new onset from fall onto R side. Reporting pain at 9/10 NPS but does not display non-verbal signs of pain like grimacing. Able to transfer from supine to sitting EOB with log roll technique and minimal use of bed rail for assist. Also able to stand to RW with poor hand palcement with supervision. Pt educated on safe hand placement technique. Pt in standing reports increase in pain and tolerated standing 10 sec prior to requesting sitting rest. PT easily stood to RW only requiring supervision. Pt educated on benefits of attempting walking but further declining due to increased pain with standing. Pt performed SPT with RW to recliner with encouragement only requiring supervision. Due to 2 mechanical falls at home since previous stay, limited ability for physical assist from spouse if needed, and stairs to enter house, STR will be warranted to progress safe pt mobility prior to  transitioning to home environment. Believe if pt's pain becomes more manageable, based off of no need for physical assist with today's mobility, anticipate pt would be able to transition to home environment. Pt currently with functional limitations due to the deficits listed below (see PT Problem List). Pt will benefit from skilled PT to increase their independence and safety with mobility to allow discharge to the venue listed below.       Recommendations for follow up therapy are one component of a multi-disciplinary discharge planning process, led by the attending physician.  Recommendations may be updated based on patient status, additional functional criteria and insurance authorization.  Follow Up Recommendations SNF    Equipment Recommendations  Other (comment) (next venue of care)    Recommendations for Other Services       Precautions / Restrictions Precautions Precautions: Fall Restrictions Weight Bearing Restrictions: No      Mobility  Bed Mobility Overal bed mobility: Needs Assistance Bed Mobility: Supine to Sit     Supine to sit: Modified independent (Device/Increase time)     General bed mobility comments: Log roll independently. Did rely on bed rail.    Transfers Overall transfer level: Needs assistance Equipment used: Rolling walker (2 wheeled) Transfers: Stand Pivot Transfers Sit to Stand: Supervision Stand pivot transfers: Supervision       General transfer comment: CGA for safety  Ambulation/Gait     Assistive device: Rolling walker (2 wheeled) Gait Pattern/deviations: Step-to pattern;Trunk flexed     General Gait Details: Deferring walking. Reports too painful in R hip  to attempt ambulation.  Stairs            Wheelchair Mobility    Modified Rankin (Stroke Patients Only)       Balance Overall balance assessment: Needs assistance Sitting-balance support: Feet supported;Bilateral upper extremity supported Sitting balance-Leahy  Scale: Fair     Standing balance support: Bilateral upper extremity supported Standing balance-Leahy Scale: Fair Standing balance comment: UE support to sustain static standing d/t weakness                             Pertinent Vitals/Pain Pain Assessment: 0-10 Pain Score: 9  Pain Location: R hip Pain Intervention(s): Limited activity within patient's tolerance;Monitored during session;Premedicated before session    Home Living Family/patient expects to be discharged to:: Private residence Living Arrangements: Spouse/significant other Available Help at Discharge: Family;Available 24 hours/day;Other (Comment) (spouse reports inability to apply physical assistance) Type of Home: House Home Access: Stairs to enter Entrance Stairs-Rails: Can reach both Entrance Stairs-Number of Steps: Front 4; Back 8   Home Equipment: Walker - 4 wheels;Cane - single point      Prior Function Level of Independence: Independent               Hand Dominance        Extremity/Trunk Assessment   Upper Extremity Assessment Upper Extremity Assessment: Defer to OT evaluation;Generalized weakness    Lower Extremity Assessment Lower Extremity Assessment: Generalized weakness;RLE deficits/detail RLE Deficits / Details: pain RLE Sensation: WNL    Cervical / Trunk Assessment Cervical / Trunk Assessment: Kyphotic  Communication   Communication: No difficulties (appears to be HoH.)  Cognition Arousal/Alertness: Awake/alert Behavior During Therapy: WFL for tasks assessed/performed Overall Cognitive Status: Within Functional Limits for tasks assessed                                 General Comments: Visibly and verablly frsutrated about persistent, elevated pain levels.      General Comments      Exercises Other Exercises Other Exercises: Role of PT in acute setting. D/c recs at this point in time.   Assessment/Plan    PT Assessment Patient needs continued  PT services  PT Problem List Decreased strength;Decreased activity tolerance;Decreased balance;Decreased mobility;Decreased knowledge of use of DME;Decreased safety awareness;Pain       PT Treatment Interventions DME instruction;Gait training;Stair training;Functional mobility training;Therapeutic activities;Therapeutic exercise;Balance training;Neuromuscular re-education;Patient/family education    PT Goals (Current goals can be found in the Care Plan section)  Acute Rehab PT Goals Patient Stated Goal: improve pain, go home PT Goal Formulation: With patient/family Time For Goal Achievement: 02/06/21    Frequency Min 2X/week   Barriers to discharge Inaccessible home environment Stairs; spouse can not physically assist pt if needed. Frequent falls at home after previous hospital stay.    Co-evaluation               AM-PAC PT "6 Clicks" Mobility  Outcome Measure Help needed turning from your back to your side while in a flat bed without using bedrails?: A Little Help needed moving from lying on your back to sitting on the side of a flat bed without using bedrails?: A Little Help needed moving to and from a bed to a chair (including a wheelchair)?: A Little Help needed standing up from a chair using your arms (e.g., wheelchair or bedside chair)?: A Little Help needed to  walk in hospital room?: A Lot Help needed climbing 3-5 steps with a railing? : A Lot 6 Click Score: 16    End of Session Equipment Utilized During Treatment: Gait belt Activity Tolerance: Patient limited by pain Patient left: in chair;with family/visitor present;with call bell/phone within reach Nurse Communication: Mobility status PT Visit Diagnosis: Unsteadiness on feet (R26.81);Other abnormalities of gait and mobility (R26.89);Muscle weakness (generalized) (M62.81);Difficulty in walking, not elsewhere classified (R26.2);Pain Pain - part of body: Hip    Time: 6067-7034 PT Time Calculation (min) (ACUTE  ONLY): 29 min   Charges:   PT Evaluation $PT Eval Moderate Complexity: 1 Mod PT Treatments $Therapeutic Activity: 8-22 mins        Jaret Coppedge M. Fairly IV, PT, DPT Physical Therapist- [facility]  01/23/2021, 12:20 PM

## 2021-01-23 NOTE — Progress Notes (Signed)
PROGRESS NOTE    JERELLE VIRDEN  YWV:[id number] DOB: [birth year] DOA: 01/21/2021 PCP: [name], NP    Brief Narrative:  70 y.o. 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  The patient underwent l kyphoplasty for pathological L3 closed wedge compression fracture 3 days ago.  Over the last few days he sustained mechanical falls on his right hip.  Today he was not able to stand on or to sit from pain.  He denies any presyncope or syncope.  No headache or dizziness or blurred vision.  No paresthesias or focal muscle weakness.  CT imaging survey of lumbar spine and right hip negative for fracture or dislocation.  Pain control improving   Assessment & Plan:   Active Problems:   Right hip pain  Right hip pain Recurrent mechanical falls Inability to ambulate Patient status post kyphoplasty and lumbar fusion X-ray right hip negative for fracture or dislocation Pain likely pathologic secondary to metastatic lung cancer Pain improving on current regimen Plan: Multimodal pain control, continue current regimen for now Physical therapy evaluation I have reached out to oncologist Dr. Rogue Bussing for recommendations regarding pain control  Hyponatremia Appears chronic and asymptomatic Continue to monitor  Small cell lung cancer stage IV Diffuse pain secondary to above Patient under treatment with oncology Has known bony metastases and diffuse pain secondary to this Plan: Multimodal pain control Oncology made aware  Hyperlipidemia Will hold statin for now Myalgias may be contributing to diffuse pain Lipid panel reassuring CK  normal Plan: DC Lipitor  Coronary artery disease Continue Imdur Lopressor and aspirin Statin on hold as above  Vitamin B12 deficiency PTA B12 supplementation  GERD PTA Carafate  DVT prophylaxis: SQ Lovenox Code Status: Full Family Communication: None today.  Offered to call but the patient declined Disposition Plan:Status is: Observation  The patient will require care spanning > 2 midnights and should be moved to inpatient because: Ongoing active pain requiring inpatient pain management  Dispo: The patient is from: Home              Anticipated d/c is to: SNF              Patient currently is not medically stable to d/c.   Difficult to place patient No  Patient remains in significant amount of pain and has decreased ability to ambulate secondary to metastatic cancer pain.   Level of care: Med-Surg  Consultants:  None  Procedures:  None  Antimicrobials:  None   Subjective: Seen and examined.  Pain control right hip is improving  Objective: Vitals:   01/22/21 1856 01/22/21 1950 01/23/21 0442 01/23/21 0746  BP: (!) 103/50 (!) 95/51 109/60 132/67  Pulse: 73 75 67 83  Resp:  16 14 15   Temp:  97.7 F (36.5 C) 98.4 F (36.9 C) (!) 97.4 F (36.3 C)  TempSrc:  Oral Oral Oral  SpO2:  99% 99% 100%    Intake/Output Summary (Last 24 hours) at 01/23/2021 1019 Last data filed at 01/23/2021 0443 Gross per 24 hour  Intake 480 ml  Output 800 ml  Net -320 ml   There were no vitals filed for this visit.  Examination:  General exam: Lying in bed.  Mild distress when trying to move Respiratory system: Lungs clear.  Normal work of breathing.  Room air Cardiovascular system: S1-S2, RRR, no murmurs, no pedal edema Gastrointestinal system: Thin, nontender, nondistended, normal bowel sounds Central nervous system: Alert and oriented. No focal neurological deficits. Extremities: Decreased power and ROM bilateral lower extremities Skin: No rashes, lesions or  ulcers Psychiatry: Judgement and insight appear normal. Mood & affect appropriate.     Data Reviewed: I have personally reviewed following labs and imaging studies  CBC: Recent Labs  Lab 01/21/21 2001 01/22/21 0444  WBC 6.8 6.2  NEUTROABS 5.7  --   HGB 9.5* 8.3*  HCT 27.7* 24.9*  MCV 86.8 87.4  PLT 263 294   Basic Metabolic Panel: Recent Labs  Lab 01/17/21 0436 01/18/21 0415 01/19/21 0444 01/21/21 2001 01/22/21 0444  NA 129* 128* 128* 128* 131*  K 4.5 4.2 4.4 4.0 3.8  CL 92* 92* 91* 93* 96*  CO2 31 30 30 28 25   GLUCOSE 171* 135* 203* 122* 97  BUN 16 13 15 14 12   CREATININE 0.47* 0.53* 0.57* 0.60* 0.63  CALCIUM 8.8* 8.4* 9.0 8.5* 8.2*  MG 1.8 1.8 1.8  --   --    GFR: Estimated Creatinine Clearance: 63.5 mL/min (by C-G formula based on SCr of 0.63 mg/dL). Liver Function Tests: Recent Labs  Lab 01/21/21 2001  AST 23  ALT 18  ALKPHOS 116  BILITOT 1.0  PROT 6.2*  ALBUMIN 2.5*   No results for input(s): LIPASE, AMYLASE in the last 168 hours. No results for input(s): AMMONIA in the last 168 hours. Coagulation Profile: No results for input(s): INR, PROTIME in the last 168 hours. Cardiac Enzymes: Recent Labs  Lab 01/22/21 1101  CKTOTAL 72   BNP (last 3 results) No results for input(s): PROBNP in the last 8760 hours. HbA1C: No results for input(s): HGBA1C in the last 72 hours. CBG: Recent Labs  Lab 01/17/21 0540 01/17/21 1456 01/17/21 1626 01/18/21 0743  GLUCAP 149* 119* 128* 130*   Lipid Profile: Recent Labs    01/23/21 0533  CHOL 66  HDL 21*  LDLCALC 33  TRIG 60  CHOLHDL 3.1   Thyroid Function Tests: No results for input(s): TSH, T4TOTAL, FREET4, T3FREE, THYROIDAB in the last 72 hours. Anemia Panel: No results for input(s): VITAMINB12, FOLATE, FERRITIN, TIBC, IRON, RETICCTPCT in the last 72 hours. Sepsis Labs: No results for input(s): PROCALCITON, LATICACIDVEN in the last 168 hours.  Recent Results (from the past 240 hour(s))  SARS  CORONAVIRUS 2 (TAT 6-24 HRS) Nasopharyngeal Nasopharyngeal Swab     Status: None   Collection Time: 01/21/21  8:01 PM   Specimen: Nasopharyngeal Swab  Result Value Ref Range Status   SARS Coronavirus 2 NEGATIVE NEGATIVE Final    Comment: (NOTE) SARS-CoV-2 target nucleic acids are NOT DETECTED.  The SARS-CoV-2 RNA is generally detectable in upper and lower respiratory specimens during the acute phase of infection. Negative results do not preclude SARS-CoV-2 infection, do not rule out co-infections with other pathogens, and should not be used as the sole basis for treatment or other patient management decisions. Negative results must be combined with clinical observations, patient history, and epidemiological information. The expected result is Negative.  Fact Sheet for Patients: SugarRoll.be  Fact Sheet for Healthcare Providers: https://www.woods-mathews.com/  This test is not yet approved or cleared by the [place] FDA and  has been authorized for detection and/or diagnosis of SARS-CoV-2 by FDA under an Emergency Use Authorization (EUA). This EUA will  remain  in effect (meaning this test can be used) for the duration of the COVID-19 declaration under Se ction 564(b)(1) of the Act, 21 U.S.C. section 360bbb-3(b)(1), unless the authorization is terminated or revoked sooner.  Performed at [facility], [place] [address] [place], [place] [address]          Radiology Studies: CT Lumbar Spine Wo Contrast  Result Date: 01/21/2021 CLINICAL DATA:  Initial evaluation for worsening back pain, right hip pain, recent surgery. EXAM: CT LUMBAR SPINE WITHOUT CONTRAST TECHNIQUE: Multidetector CT imaging of the lumbar spine was performed without intravenous contrast administration. Multiplanar CT image reconstructions were also generated. COMPARISON:  MRI from 01/13/2021. FINDINGS: Segmentation: Standard. Lowest well-formed disc space labeled  the L5-S1 level. Alignment: Mild sigmoid scoliotic curvature of the thoracolumbar spine. 3 mm retrolisthesis of L1 on L2. Vertebrae: There has been interval performance of vertebral augmentation for previously identified pathologic fracture at L3. No visible complicating features. Associated height loss is stable without significant interval collapse. Posterior convex bowing of the L3 vertebral body with resultant moderate spinal stenosis is grossly stable. Extraosseous extension of tumor into the adjacent right psoas musculature also grossly similar. Vertebral body height otherwise maintained with no other acute or interval fracture. Visualized sacrum intact. SI joints symmetric and normal. There is an additional lytic metastatic lesion involving the left iliac wing (series 7, image 45). No other visible osseous metastatic disease. Paraspinal and other soft tissues: Extraosseous extension of tumor into the right psoas musculature related to the L3 metastasis, grossly stable from prior MRI. Paraspinous soft tissues demonstrate no other acute finding. Advanced aorto bi-iliac atherosclerotic disease. Disc levels: L1-2: Trace retrolisthesis with mild disc bulge. No spinal stenosis. Foramina remain patent. L2-3: Mild disc bulge with facet hypertrophy. No spinal stenosis. Foramina remain patent. L3-4: Degenerative intervertebral disc space narrowing with diffuse disc bulge. Mild facet hypertrophy. Moderate to advanced spinal stenosis related to posterior convex bowing related to the pathologic L3 compression fracture noted, grossly similar. Moderate to severe right with mild left L3 foraminal stenosis, grossly stable. L4-5: Mild disc bulge with facet hypertrophy. No spinal stenosis. No more than mild bilateral L4 foraminal narrowing. L5-S1: Mild disc bulge. Right greater than left facet arthrosis. No spinal stenosis. Foramina remain patent. IMPRESSION: 1. Interval performance of vertebral augmentation for previously  identified pathologic fracture at L3. No visible complication. Associated height loss is stable without further interval collapse. Posterior convex bowing of the L3 vertebral body with resultant moderate to advanced spinal stenosis, similar to previous. Moderate to severe right L3 foraminal stenosis also grossly stable. 2. Additional lytic metastatic lesion involving the left iliac wing. 3. No other acute abnormality within the lumbar spine. 4. Aortic Atherosclerosis (ICD10-I70.0). Electronically Signed   By: Jeannine Boga M.D.   On: 01/21/2021 20:40   CT Hip Right Wo Contrast  Result Date: 01/21/2021 CLINICAL DATA:  Fall, right hip pain EXAM: CT OF THE RIGHT HIP WITHOUT CONTRAST TECHNIQUE: Multidetector CT imaging of the right hip was performed according to the standard protocol. Multiplanar CT image reconstructions were also generated. COMPARISON:  X-ray 01/21/2021 FINDINGS: Bones/Joint/Cartilage Right hip is intact without fracture or dislocation. No evidence of avascular necrosis of the femoral head. Right hip joint space is maintained. No hip joint effusion is evident. Visualized portion of the right hemipelvis is intact. Pubic symphysis and right sacroiliac joint intact without diastasis. No lytic or sclerotic bony lesions are identified. Site of known right iliac crest metastatic lesion is not included within  the field of view on the current study. Ligaments Suboptimally assessed by CT. Muscles and Tendons No acute musculotendinous abnormality by CT. Soft tissues Mild subcutaneous edema overlying the greater trochanter. No organized fluid collection or hematoma. No abnormally enlarged right inguinal lymph nodes. Bilateral hydroceles. Atherosclerotic vascular calcifications are present. IMPRESSION: 1. No acute fracture or dislocation of the right hip. 2. Bilateral hydroceles. Electronically Signed   By: Davina Poke D.O.   On: 01/21/2021 20:22   DG Hip Unilat  With Pelvis 2-3 Views  Right  Result Date: 01/21/2021 CLINICAL DATA:  Right hip pain. History of metastatic non-small cell lung cancer EXAM: DG HIP (WITH OR WITHOUT PELVIS) 2-3V RIGHT COMPARISON:  MRI pelvis 01/13/2021 FINDINGS: No acute fracture or dislocation. Right hip joint space is preserved. Known destructive lesion at the left iliac crest is not well seen, partially obscured by overlying stool and bowel gas. Small marrow replacing lesion of the right iliac crest seen on recent MRI is not well demonstrated radiographically. Otherwise, no evidence of a new destructive bone lesion. IMPRESSION: 1. No acute fracture or dislocation. 2. Known metastatic lesions within the bilateral iliac crests are not well seen radiographically. Electronically Signed   By: Davina Poke D.O.   On: 01/21/2021 18:25        Scheduled Meds:  acetaminophen  1,000 mg Oral TID   aspirin EC  81 mg Oral Daily   enoxaparin (LOVENOX) injection  40 mg Subcutaneous Q24H   ezetimibe  10 mg Oral Daily   feeding supplement  237 mL Oral TID BM   fentaNYL  1 patch Transdermal Q72H   gabapentin  300 mg Oral TID   icosapent Ethyl  2 g Oral BID   isosorbide mononitrate  30 mg Oral Daily   ketorolac  15 mg Intravenous Q6H   metoprolol tartrate  25 mg Oral BID   sucralfate  0.5 g Oral TID AC   vitamin B-12  1,000 mcg Oral Daily   Continuous Infusions:  sodium chloride 75 mL/hr at 01/23/21 0148     LOS: 0 days    Time spent: 25 minutes    Sidney Ace, MD [facility] Pager [phone]  If 7PM-7AM, please contact night-coverage 01/23/2021, 10:19 AM

## 2021-01-23 NOTE — Evaluation (Signed)
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   Clinical Impression   Pt was seen for OT evaluation this date. Prior to hospital admission, pt was at home only briefly after being discharged from the hospital when he experienced 2 falls at home and severe R hip pain and continued low back pain after recent kyphoplasty. Pt lives with his spouse in a 1 story home with 4 steps + rails in the front. Pt reports being independent with ADL/IADL/mobility prior to the pain and recent admissions.  Currently pt demonstrates impairments in strength, severe pain, and activity tolerance as described below (See OT problem list) which functionally limit his ability to perform ADL/self-care tasks. Pt currently requires MOD-MAX A for LB ADL tasks. Pt declined OOB attempts 2/2 pain. RN notified. Pt instructed in home/routines modifications, AE/DME to maximize safety/indep with ADL, support energy conservation, and instructed in cognitive behavioral pain coping strategies to support overall pain mgt. Pt verbalized understanding. Pt would benefit from skilled OT services to address noted impairments and functional limitations (see below for any additional details) in order to maximize safety and independence while minimizing falls risk and caregiver burden. Upon hospital discharge, recommend STR at this time to maximize pt safety and return to PLOF.     Recommendations for follow up therapy are one component of a  multi-disciplinary discharge planning process, led by the attending physician.  Recommendations may be updated based on patient status, additional functional criteria and insurance authorization.   Follow Up Recommendations  SNF    Equipment Recommendations  Tub/shower seat    Recommendations for Other Services       Precautions / Restrictions Precautions Precautions: Fall Restrictions Weight Bearing Restrictions: No      Mobility Bed Mobility               General bed mobility comments: pt declined 2/2 pain    Transfers                      Balance                                           ADL either performed or assessed with clinical judgement   ADL Overall ADL's : Needs assistance/impaired                                       General ADL Comments: Pt currently requires MOD-MAX A for LB ADL from seated position 2/2 R hip and low back pain.     Vision Patient Visual Report: No change from baseline       Perception     Praxis      Pertinent Vitals/Pain Pain Assessment: 0-10 Pain Score: 9  Pain Location: R hip, low back 9.5/10 Pain Descriptors / Indicators: Aching;Sharp;Squeezing;Other (Comment) (  twisting) Pain Intervention(s): Limited activity within patient's tolerance;Monitored during session;Premedicated before session;Relaxation;Utilized relaxation techniques;Patient requesting pain meds-RN notified     Hand Dominance Left   Extremity/Trunk Assessment Upper Extremity Assessment Upper Extremity Assessment: Generalized weakness   Lower Extremity Assessment Lower Extremity Assessment: Generalized weakness;RLE deficits/detail RLE Deficits / Details: R hip pain severe at rest, worse with movement       Communication Communication Communication: HOH (appears to be [facility].)   Cognition Arousal/Alertness: Awake/alert Behavior During Therapy: WFL for tasks assessed/performed Overall Cognitive Status:  Within Functional Limits for tasks assessed                                     General Comments       Exercises Other Exercises Other Exercises: Pt instructed in home/routines modifications, AE/DME to maximize safety/indep with ADL, support energy conservation, and instructed in cognitive behavioral pain coping strategies to support overall pain mgt. Pt verbalized understanding. Would benefit from additional instruction to support recall and carryover.   Shoulder Instructions      Home Living Family/patient expects to be discharged to:: Private residence Living Arrangements: Spouse/significant other Available Help at Discharge: Family;Available 24 hours/day;Other (Comment) (spouse reports inability to apply physical assistance) Type of Home: House Home Access: Stairs to enter [employer] of Steps: Front 4; Back 8 Entrance Stairs-Rails: Can reach both Home Layout: One level     Bathroom Shower/Tub: Teacher, early years/pre: Standard     Home Equipment: Environmental consultant - 4 wheels;Cane - single point;Bedside commode          Prior Functioning/Environment Level of Independence: Independent        Comments: prior to recent admissions, pt was independent with ADL/IADL/mobility.        OT Problem List: Decreased strength;Decreased activity tolerance;Impaired balance (sitting and/or standing);Decreased knowledge of use of DME or AE;Pain      OT Treatment/Interventions: Self-care/ADL training;Therapeutic exercise;Energy conservation;DME and/or AE instruction;Therapeutic activities;Patient/family education    OT Goals(Current goals can be found in the care plan section) Acute Rehab OT Goals Patient Stated Goal: improve pain, go home OT Goal Formulation: With patient Time For Goal Achievement: 02/06/21 Potential to Achieve Goals: Good ADL Goals Pt Will Perform Lower Body Dressing: with min assist;sit to/from stand;with adaptive equipment (AE and  LRAD) Pt Will Transfer to Toilet: with supervision;ambulating;bedside commode (BSC over commode, LRAD) Additional ADL Goal #1: Pt will verbalize plan to implement at least 1 learned pain coping strategy to support overall pain mgt. Additional ADL Goal #2: Pt will identify 2 strategies to support falls prevention/energy conservation to maximize safety/indep with ADL.  OT Frequency: Min 1X/week   Barriers to D/C: Decreased caregiver support  Pt/spouse report spouse unable to directly physically assist       Co-evaluation              AM-PAC OT "6 Clicks" Daily Activity     Outcome Measure Help from another person eating meals?: None Help from another person taking care of personal grooming?: None Help from another person toileting, which includes using toliet, bedpan, or urinal?: A Lot Help from another person bathing (including washing, rinsing, drying)?: A Lot Help from another person to put on and taking off regular upper body clothing?: A Little Help from another person to put on and taking off regular lower body clothing?: A Lot 6 Click Score: 17   End of Session Nurse  Communication: Patient requests pain meds  Activity Tolerance: Patient limited by pain Patient left: in bed;with call bell/phone within reach;with bed alarm set  OT Visit Diagnosis: Unsteadiness on feet (R26.81);Muscle weakness (generalized) (M62.81);Pain Pain - Right/Left: Right Pain - part of body: Hip                Time: 8871-9597 OT Time Calculation (min): 14 min Charges:  OT General Charges $OT Visit: 1 Visit OT Evaluation $OT Eval Moderate Complexity: 1 Mod OT Treatments $Therapeutic Activity: 8-22 mins  Ardeth Perfect., MPH, MS, OTR/L ascom [phone] 01/23/21, 4:51 PM

## 2021-01-23 NOTE — Progress Notes (Signed)
Initial Nutrition Assessment  DOCUMENTATION CODES:  Not applicable  INTERVENTION:  Obtain updated weight.  Continue Ensure TID.  Add Magic cup TID with meals, each supplement provides 290 kcal and 9 grams of protein.  Add MVI with minerals daily.  Encourage PO and supplement intake.  NUTRITION DIAGNOSIS:  Increased nutrient needs related to cancer and cancer related treatments as evidenced by estimated needs.  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 9/20 - s/p kyphoplasty of L3  RD working remotely. Pt's wife answered phone but stated now was not a good time to talk.  Per Epic, pt ate 80% of breakfast and 50% of dinner yesterday. Also, he ate 75% of breakfast this morning.  During last admission 5 days ago, RD diagnosed pt with severe malnutrition. This is likely still the case, but this RD cannot definitively diagnose at this time.  Pt with no new weight this admission. RD to order measured weight.  Continue Ensure TID, and add Magic Cup TID, as well as MVI with minerals daily.  Supplements: Ensure TID  Medications: reviewed; sucralfate TID, Vitamin B12, NaCl @ 75 ml/hr, oxycodone PO PRN (given twice today)  Labs: reviewed; Na 131 (L)  NUTRITION - FOCUSED PHYSICAL EXAM: Unable to perform - defer to in-person assessment  Diet Order:   Diet Order             Diet regular Room service appropriate? Yes; Fluid consistency: Thin  Diet effective now                  EDUCATION NEEDS:  No  education needs have been identified at this time  Skin:  Skin Assessment: Skin Integrity Issues: Skin Integrity Issues:: Stage II, Incisions Stage II: Pressure Injury - sacrum Incisions: Back, closed  Last BM:  01/19/21  Height:  Ht Readings from Last 1 Encounters:  01/13/21 5\' 4"  (1.626 m)   Weight:  Wt Readings from Last 1 Encounters:  01/13/21 53 kg   BMI:  There is no height or weight on file to calculate BMI.  Estimated Nutritional Needs:  Kcal:  1800-2000 Protein:  70-85 grams Fluid:  >1.8 L  Derrel Nip, RD, LDN (she/her/hers) Registered Dietitian I After-Hours/Weekend Pager # in [place]

## 2021-01-23 NOTE — Progress Notes (Signed)
[place] [facility]) Hospital Liaison note:  This is a pending outpatient-based Palliative Care patient. Will continue to follow for disposition.  Please call with any outpatient palliative questions or concerns.  Thank you, Lorelee Market, LPN [facility] Liaison [phone]

## 2021-01-24 ENCOUNTER — Ambulatory Visit: Payer: Medicare HMO

## 2021-01-24 DIAGNOSIS — D649 Anemia, unspecified: Secondary | ICD-10-CM

## 2021-01-24 DIAGNOSIS — C3412 Malignant neoplasm of upper lobe, left bronchus or lung: Secondary | ICD-10-CM

## 2021-01-24 DIAGNOSIS — R52 Pain, unspecified: Secondary | ICD-10-CM | POA: Diagnosis not present

## 2021-01-24 MED ORDER — METHOCARBAMOL 500 MG PO TABS
750.0000 mg | ORAL_TABLET | Freq: Four times a day (QID) | ORAL | Status: DC
  Administered 2021-01-24 – 2021-02-04 (×44): 750 mg via ORAL
  Filled 2021-01-24 (×45): qty 2

## 2021-01-24 MED ORDER — FENTANYL 75 MCG/HR TD PT72
1.0000 | MEDICATED_PATCH | TRANSDERMAL | Status: DC
  Administered 2021-01-24 – 2021-01-27 (×2): 1 via TRANSDERMAL
  Filled 2021-01-24 (×4): qty 1

## 2021-01-24 MED ORDER — ALPRAZOLAM 0.25 MG PO TABS
0.2500 mg | ORAL_TABLET | Freq: Two times a day (BID) | ORAL | Status: DC | PRN
  Administered 2021-01-25 – 2021-02-01 (×4): 0.25 mg via ORAL
  Filled 2021-01-24 (×4): qty 1

## 2021-01-24 MED ORDER — HYDROMORPHONE HCL 1 MG/ML IJ SOLN
1.0000 mg | INTRAMUSCULAR | Status: DC | PRN
Start: 2021-01-24 — End: 2021-01-26
  Administered 2021-01-25: 2 mg via INTRAVENOUS
  Administered 2021-01-25: 1 mg via INTRAVENOUS
  Administered 2021-01-26: 2 mg via INTRAVENOUS
  Filled 2021-01-24: qty 2
  Filled 2021-01-24: qty 1
  Filled 2021-01-24: qty 2

## 2021-01-24 MED ORDER — GABAPENTIN 600 MG PO TABS
600.0000 mg | ORAL_TABLET | Freq: Three times a day (TID) | ORAL | Status: DC
  Administered 2021-01-24 – 2021-02-04 (×34): 600 mg via ORAL
  Filled 2021-01-24 (×34): qty 1

## 2021-01-24 NOTE — Progress Notes (Addendum)
Patient refused radiation for today due to pain. Oncology notified. Dr. Priscella Mann aware.

## 2021-01-24 NOTE — Progress Notes (Signed)
Patient taken down to Radiology. Patient unable to lay on table due to pain. Patient returned back to floor. Radiation not completed today 01/23/21.

## 2021-01-24 NOTE — Consult Note (Signed)
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  INTERVAL HISTORY: Patient is a 70 year old male with known stage IV lung cancer with bone metastasis who recently completed concurrent chemotherapy and XRT, but has yet to start his maintenance immunotherapy.  He was admitted to the hospital with intractable pain in his back which is resolved with kyphoplasty.  Currently, he is now having intractable pain in his right hip to the point where he cannot tolerate lying on the table for radiation treatments.  Patient states when he does not move his leg only "throbs".  He has no neurologic complaints.  He denies any other pain.  He has a good appetite and denies weight loss.  He denies any recent fevers.  He has no chest pain, shortness of breath, cough, or hemoptysis.  He has no nausea, vomiting, constipation, or diarrhea.  He has no urinary complaints.  Patient offers no further specific complaints today.  REVIEW OF SYSTEMS:   Review of Systems  Constitutional: Negative.  Negative for fever, malaise/fatigue and weight loss.  Respiratory: Negative.  Negative for cough, hemoptysis and shortness of breath.   Cardiovascular: Negative.  Negative for chest pain and leg swelling.  Gastrointestinal: Negative.  Negative for abdominal pain.  Genitourinary: Negative.  Negative for dysuria.  Musculoskeletal:  Positive for joint pain. Negative for back pain.  Skin: Negative.  Negative for rash.  Neurological: Negative.  Negative for dizziness, focal weakness, weakness and headaches.  Psychiatric/Behavioral: Negative.  The patient is not  nervous/anxious.    As per HPI. Otherwise, a complete review of systems is negative.  PAST MEDICAL HISTORY: Past Medical History:  Diagnosis Date   Aortic atherosclerosis ([place])    Bochdalek hernia 09/21/2020   fatty   Coronary artery disease    a. 04/2018 NSTEMI/Cath: LM nl, LAD 50p, 67m/d diffuse-small caliber, LCX heavily Ca2+ sev prox/mid dzs, RCA 90 diff distal dzs into RPL and RPDA. EF 50-55%-->Med Rx.   Hepatic steatosis    History of 2019 novel coronavirus disease (COVID-19) 10/12/2020   History of echocardiogram    a. 04/2018 Echo: EF 55-60%, no rwma, mild MR, mildly dil LA. Nl RV fxn.   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 Quit 04/2018.    PAST SURGICAL HISTORY: Past Surgical History:  Procedure Laterality Date   BRAIN SURGERY     CARDIAC CATHETERIZATION     IR IMAGING GUIDED PORT INSERTION  10/28/2020   KYPHOPLASTY N/A 01/17/2021   Procedure: Claybon Jabs RFA;  Surgeon: Hessie Knows, MD;  Location: [facility];  Service: Orthopedics;  Laterality: N/A;   LEFT HEART CATH AND CORONARY ANGIOGRAPHY N/A 05/16/2018   Procedure: LEFT HEART CATH AND CORONARY ANGIOGRAPHY poss PCI;  Surgeon: [name], MD;  Location: [place] [facility];  Service: Cardiovascular;  Laterality: N/A;   VIDEO BRONCHOSCOPY WITH ENDOBRONCHIAL NAVIGATION N/A 10/24/2020   Procedure: ROBOTIC  ASSISTED VIDEO BRONCHOSCOPY WITH ENDOBRONCHIAL NAVIGATION;  Surgeon: Tyler Pita, MD;  Location: [facility];  Service: Pulmonary;  Laterality: N/A;    FAMILY HISTORY: Family History  Problem Relation Age of Onset   Heart Problems Mother    Heart attack Father    Diabetes Brother    Heart Problems Brother     ADVANCED DIRECTIVES  (Y/N):  @ADVDIR @  HEALTH MAINTENANCE: Social History   Tobacco Use   Smoking status: Former    Packs/day: 1.00    Years: 45.00    Pack years: 45.00    Types: Cigarettes    Quit date: 05/15/2018    Years since quitting: 2.6   Smokeless tobacco: Never  Vaping Use   Vaping Use: Never used  Substance Use Topics   Alcohol use: Never   Drug use: Never     Colonoscopy:  PAP:  Bone density:  Lipid panel:  No Known Allergies  Current Facility-Administered Medications  Medication Dose Route Frequency Provider Last Rate Last Admin   acetaminophen (TYLENOL) tablet 650 mg  650 mg Oral Q6H PRN Mansy, Jan A, MD       Or   acetaminophen (TYLENOL) suppository 650 mg  650 mg Rectal Q6H PRN Mansy, Jan A, MD       acetaminophen (TYLENOL) tablet 1,000 mg  1,000 mg Oral TID [name], MD   1,000 mg at 01/24/21 0953   aspirin EC tablet 81 mg  81 mg Oral Daily Mansy, Jan A, MD   81 mg at 01/24/21 0953   bisacodyl (DULCOLAX) suppository 10 mg  10 mg Rectal Daily PRN [name], MD   10 mg at 01/24/21 1441   dicyclomine (BENTYL) capsule 10 mg  10 mg Oral TID PRN Mansy, Jan A, MD       enoxaparin (LOVENOX) injection 40 mg  40 mg Subcutaneous Q24H Mansy, Jan A, MD   40 mg at 01/24/21 0816   ezetimibe (ZETIA) tablet 10 mg  10 mg Oral Daily Mansy, Jan A, MD   10 mg at 01/24/21 0954   feeding supplement (ENSURE ENLIVE / ENSURE PLUS) liquid 237 mL  237 mL Oral TID BM Mansy, Jan A, MD   237 mL at 01/24/21 1441   fentaNYL (DURAGESIC) 75 MCG/HR 1 patch  1 patch Transdermal Q72H Sreenath, Sudheer B, MD       gabapentin (NEURONTIN) tablet 600 mg  600 mg Oral TID [name], MD   600 mg at 01/24/21 0954   HYDROmorphone (DILAUDID) injection 1-2 mg  1-2 mg Intravenous Q3H PRN Lloyd Huger, MD       icosapent Ethyl (VASCEPA) 1 g capsule 2 g  2 g Oral BID Mansy, Jan A, MD   2 g at 01/24/21 0954   isosorbide mononitrate (IMDUR) 24 hr tablet 30 mg  30 mg Oral Daily Mansy, Jan A, MD    30 mg at 01/24/21 0954   ketorolac (TORADOL) 15 MG/ML injection 15 mg  15 mg Intravenous Q6H Sreenath, Sudheer B, MD   15 mg at 01/24/21 1239   magnesium hydroxide (MILK OF MAGNESIA) suspension 30 mL  30 mL Oral Daily PRN Mansy, Jan A, MD       methocarbamol (ROBAXIN) tablet 750 mg  750 mg Oral QID Priscella Mann, Sudheer B, MD   750 mg at 01/24/21 1441   metoprolol tartrate (LOPRESSOR) tablet 25 mg  25 mg Oral BID Mansy, Jan A, MD   25 mg at 01/24/21 [phone]  multivitamin with minerals tablet 1 tablet  1 tablet Oral Daily [name], MD   1 tablet at 01/24/21 0953   Muscle Rub CREA 1 application  1 application Topical Daily PRN Mansy, Jan A, MD       naloxone [facility]) injection 0.4 mg  0.4 mg Intravenous PRN Mansy, Jan A, MD       ondansetron [facility]) tablet 4 mg  4 mg Oral Q6H PRN Mansy, Jan A, MD       Or   ondansetron [facility]) injection 4 mg  4 mg Intravenous Q6H PRN Mansy, Jan A, MD       oxyCODONE (Oxy IR/ROXICODONE) immediate release tablet 10 mg  10 mg Oral Q4H PRN Mansy, Jan A, MD   10 mg at 01/24/21 0815   polyethylene glycol (MIRALAX / GLYCOLAX) packet 17 g  17 g Oral Daily [name], MD   17 g at 01/24/21 0954   senna-docusate (Senokot-S) tablet 1 tablet  1 tablet Oral BID [name], MD   1 tablet at 01/24/21 0953   sucralfate (CARAFATE) tablet 0.5 g  0.5 g Oral TID [facility] Mansy, Jan A, MD   0.5 g at 01/24/21 1240   traZODone (DESYREL) tablet 25 mg  25 mg Oral QHS PRN Mansy, Jan A, MD       vitamin B-12 (CYANOCOBALAMIN) tablet 1,000 mcg  1,000 mcg Oral Daily Mansy, Jan A, MD   1,000 mcg at 01/24/21 0953    OBJECTIVE: Vitals:   01/24/21 1214 01/24/21 1529  BP: 114/71 (!) 110/47  Pulse: 94 74  Resp: 18 18  Temp: 97.9 F (36.6 C) 98 F (36.7 C)  SpO2: 99% 100%     There is no height or weight on file to calculate BMI.    ECOG FS:2 - Symptomatic, <50% confined to bed  General: Well-developed, well-nourished, no acute distress. Eyes: Pink conjunctiva,  anicteric sclera. HEENT: Normocephalic, moist mucous membranes. Lungs: No audible wheezing or coughing. Heart: Regular rate and rhythm. Abdomen: Soft, nontender, no obvious distention. Musculoskeletal: No edema, cyanosis, or clubbing. Neuro: Alert, answering all questions appropriately. Cranial nerves grossly intact. Skin: No rashes or petechiae noted. Psych: Normal affect.  LAB RESULTS:  Lab Results  Component Value Date   NA 131 (L) 01/22/2021   K 3.8 01/22/2021   CL 96 (L) 01/22/2021   CO2 25 01/22/2021   GLUCOSE 97 01/22/2021   BUN 12 01/22/2021   CREATININE 0.63 01/22/2021   CALCIUM 8.2 (L) 01/22/2021   PROT 6.2 (L) 01/21/2021   ALBUMIN 2.5 (L) 01/21/2021   AST 23 01/21/2021   ALT 18 01/21/2021   ALKPHOS 116 01/21/2021   BILITOT 1.0 01/21/2021   GFRNONAA >60 01/22/2021   GFRAA >60 07/14/2019    Lab Results  Component Value Date   WBC 6.2 01/22/2021   NEUTROABS 5.7 01/21/2021   HGB 8.3 (L) 01/22/2021   HCT 24.9 (L) 01/22/2021   MCV 87.4 01/22/2021   PLT 239 01/22/2021     STUDIES: DG Lumbar Spine 2-3 Views  Result Date: 01/17/2021 CLINICAL DATA:  Kyphoplasty. EXAM: DG C-ARM 1-60 MIN; LUMBAR SPINE - 2-3 VIEW FLUOROSCOPY TIME:  Fluoroscopy Time:  2 minutes and 2 seconds. Number of Acquired Spot Images: 7 COMPARISON:  MRI lumbar spine 01/13/2021. FINDINGS: Seven C-arm fluoroscopic images were obtained intraoperatively and submitted for post operative interpretation. These images demonstrate kyphoplasty of the L3 vertebral body with slight lateral extravasation. Please see the performing provider's procedural report for further  detail. IMPRESSION: Intraoperative fluoroscopy, as detailed above. Electronically Signed   By: Margaretha Sheffield M.D.   On: 01/17/2021 17:01   DG Lumbar Spine 2-3 Views  Result Date: 01/12/2021 CLINICAL DATA:  Back pain, lung cancer, evaluate for metastases EXAM: LUMBAR SPINE - 2-3 VIEW COMPARISON:  01/03/2021 FINDINGS: Mild superior endplate  compression fracture deformity at L3, with 15% loss of height, new from the prior. This reflects a pathologic fracture. Mild degenerative changes of the visualized thoracolumbar spine. Visualized bony pelvis appears intact. IMPRESSION: Mild superior endplate compression fracture deformity at L3, with 15% loss of height, new from the prior. This reflects a pathologic fracture. Electronically Signed   By: Julian Hy M.D.   On: 01/12/2021 20:04   DG Lumbar Spine Complete  Result Date: 01/04/2021 CLINICAL DATA:  Back pain EXAM: LUMBAR SPINE - COMPLETE 4+ VIEW COMPARISON:  None. FINDINGS: Vertebral body heights are well-maintained. Levocurvature of the lumbar spine with rotatory component. No evidence of fracture. Minimal retrolisthesis of L1 on L2 and L3 and L4. Multilevel degenerative disc disease with mild-to-moderate osteophyte formation and moderate disc space height loss at L3-L4. Mild multilevel facet arthropathy. Aortic vascular calcifications. IMPRESSION: No acute osseous abnormality. Mild-to-moderate multilevel degenerative disc disease and mild facet arthropathy. Electronically Signed   By: Yetta Glassman M.D.   On: 01/04/2021 10:24   CT Lumbar Spine Wo Contrast  Result Date: 01/21/2021 CLINICAL DATA:  Initial evaluation for worsening back pain, right hip pain, recent surgery. EXAM: CT LUMBAR SPINE WITHOUT CONTRAST TECHNIQUE: Multidetector CT imaging of the lumbar spine was performed without intravenous contrast administration. Multiplanar CT image reconstructions were also generated. COMPARISON:  MRI from 01/13/2021. FINDINGS: Segmentation: Standard. Lowest well-formed disc space labeled the L5-S1 level. Alignment: Mild sigmoid scoliotic curvature of the thoracolumbar spine. 3 mm retrolisthesis of L1 on L2. Vertebrae: There has been interval performance of vertebral augmentation for previously identified pathologic fracture at L3. No visible complicating features. Associated height loss is  stable without significant interval collapse. Posterior convex bowing of the L3 vertebral body with resultant moderate spinal stenosis is grossly stable. Extraosseous extension of tumor into the adjacent right psoas musculature also grossly similar. Vertebral body height otherwise maintained with no other acute or interval fracture. Visualized sacrum intact. SI joints symmetric and normal. There is an additional lytic metastatic lesion involving the left iliac wing (series 7, image 45). No other visible osseous metastatic disease. Paraspinal and other soft tissues: Extraosseous extension of tumor into the right psoas musculature related to the L3 metastasis, grossly stable from prior MRI. Paraspinous soft tissues demonstrate no other acute finding. Advanced aorto bi-iliac atherosclerotic disease. Disc levels: L1-2: Trace retrolisthesis with mild disc bulge. No spinal stenosis. Foramina remain patent. L2-3: Mild disc bulge with facet hypertrophy. No spinal stenosis. Foramina remain patent. L3-4: Degenerative intervertebral disc space narrowing with diffuse disc bulge. Mild facet hypertrophy. Moderate to advanced spinal stenosis related to posterior convex bowing related to the pathologic L3 compression fracture noted, grossly similar. Moderate to severe right with mild left L3 foraminal stenosis, grossly stable. L4-5: Mild disc bulge with facet hypertrophy. No spinal stenosis. No more than mild bilateral L4 foraminal narrowing. L5-S1: Mild disc bulge. Right greater than left facet arthrosis. No spinal stenosis. Foramina remain patent. IMPRESSION: 1. Interval performance of vertebral augmentation for previously identified pathologic fracture at L3. No visible complication. Associated height loss is stable without further interval collapse. Posterior convex bowing of the L3 vertebral body with resultant moderate to advanced spinal stenosis, similar  to previous. Moderate to severe right L3 foraminal stenosis also  grossly stable. 2. Additional lytic metastatic lesion involving the left iliac wing. 3. No other acute abnormality within the lumbar spine. 4. Aortic Atherosclerosis (ICD10-I70.0). Electronically Signed   By: Jeannine Boga M.D.   On: 01/21/2021 20:40   MR Lumbar Spine W Wo Contrast  Result Date: 01/13/2021 CLINICAL DATA:  Spine fracture, lumbosacral, pathological new pathologic compression fracture lumbar spine EXAM: MRI LUMBAR SPINE WITHOUT AND WITH CONTRAST TECHNIQUE: Multiplanar and multiecho pulse sequences of the lumbar spine were obtained without and with intravenous contrast. CONTRAST:  65mL GADAVIST GADOBUTROL 1 MMOL/ML IV SOLN COMPARISON:  PET-CT 12/12/2020.  Lumbar radiographs 01/12/2021. FINDINGS: Segmentation: Standard segmentation is assumed. The inferior-most fully formed intervertebral disc is labeled L5-S1. Alignment:  Substantial sagittal subluxation.  Levocurvature. Vertebrae: Abnormal enhancement, T1 hypointensity and STIR hyperintensity involving the entire L3 vertebral body and bilateral L3 pedicles, compatible with metastasis. Pathologic fracture with 50% vertebral body height loss. Extraosseous extension of tumor with right anterior paraspinal soft tissue mass measuring 4.2 cm with surrounding edema/enhancement. There also is posterior epidural extension of enhancing tumor into the canal and right foramen with associated severe canal stenosis and moderate to severe right foraminal stenosis. Conus medullaris and cauda equina: Conus extends to the L1 level. Conus appears normal Paraspinal and other soft tissues: Right anterolateral paraspinal soft tissue mass at L3 with extensive surrounding edema and enhancement, as detailed above. Disc levels: T12-L1: No significant disc protrusion, foraminal stenosis, or canal stenosis. L1-L2: Mild disc bulging without significant canal or foraminal stenosis. L2-L3: Mild disc bulging without significant canal or foraminal stenosis. L3-L4: Pathologic  fracture at L3 with posterior epidural extension of tumor into the canal and right foramen, as described above. Resulting severe canal stenosis and moderate to severe right foraminal stenosis. L4-L5: Small broad disc bulge and bilateral facet hypertrophy with mild right foraminal stenosis. No significant canal or left foraminal stenosis. L5-S1: Facet hypertrophy without significant canal or foraminal stenosis. Other: Please see MRI of the pelvis for evaluation of the pelvis/sacrum. IMPRESSION: 1. Osseous metastatic disease at L3 with pathologic fracture (50% height loss) and posterior epidural extension of tumor into the canal and right foramen. Resulting severe canal stenosis and moderate to severe right foraminal stenosis. Associated right anterolateral 4.2 cm paraspinal soft tissue mass at L3 with surrounding inflammation/edema. 2. No evidence of osseous metastatic disease in the other lumbar vertebral bodies. Please see concurrent MRI of the pelvis for evaluation of the pelvis/sacrum. Electronically Signed   By: Margaretha Sheffield M.D.   On: 01/13/2021 12:13   MR PELVIS W WO CONTRAST  Result Date: 01/13/2021 CLINICAL DATA:  70 y.o. 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 CONTRAST:  71mL GADAVIST GADOBUTROL 1 MMOL/ML IV SOLN COMPARISON:  PET-CT 12/12/2020 FINDINGS: Bones: No hip fracture, dislocation or avascular necrosis. 3.3 x 3.8 cm soft tissue mass with bone destruction involving the left iliac crest with surrounding bone marrow edema. Mild edema in the adjacent iliacus muscle which may related to radiation changes. 12 mm focus of osseous metastatic disease in the  right iliac crest without a soft tissue component. 5 mm osseous metastatic disease in the S1 vertebral body. Normal sacrum  and sacroiliac joints. No SI joint widening or erosive changes. Articular cartilage and labrum Articular cartilage:  No chondral defect. Labrum: Grossly intact, but evaluation is limited by lack of intraarticular fluid. Joint or bursal effusion Joint effusion:  No hip joint effusion.  No SI joint effusion. Bursae:  No bursa formation. Muscles and tendons Flexors: Mild perifascial edema involving the iliacus muscles bilaterally. Extensors: Normal. Abductors: Normal. Adductors: Normal. Gluteals: Normal. Hamstrings: Normal. Other findings No pelvic free fluid. No fluid collection or hematoma. No inguinal lymphadenopathy. No inguinal hernia. IMPRESSION: 1. Osseous metastatic disease involving the left iliac crest with a large soft tissue component measuring 3.3 x 3.8 cm and surrounding bone marrow edema. Mild edema in the adjacent iliacus muscle which may related to radiation changes. 12 mm focus of osseous metastatic disease in the right iliac crest. 5 mm osseous metastatic disease in the S1 vertebral body. 2. No hip fracture, dislocation or avascular necrosis. Electronically Signed   By: Kathreen Devoid M.D.   On: 01/13/2021 13:18   CT Hip Right Wo Contrast  Result Date: 01/21/2021 CLINICAL DATA:  Fall, right hip pain EXAM: CT OF THE RIGHT HIP WITHOUT CONTRAST TECHNIQUE: Multidetector CT imaging of the right hip was performed according to the standard protocol. Multiplanar CT image reconstructions were also generated. COMPARISON:  X-ray 01/21/2021 FINDINGS: Bones/Joint/Cartilage Right hip is intact without fracture or dislocation. No evidence of avascular necrosis of the femoral head. Right hip joint space is maintained. No hip joint effusion is evident. Visualized portion of the right hemipelvis is intact. Pubic symphysis and right sacroiliac joint intact without diastasis. No lytic or sclerotic  bony lesions are identified. Site of known right iliac crest metastatic lesion is not included within the field of view on the current study. Ligaments Suboptimally assessed by CT. Muscles and Tendons No acute musculotendinous abnormality by CT. Soft tissues Mild subcutaneous edema overlying the greater trochanter. No organized fluid collection or hematoma. No abnormally enlarged right inguinal lymph nodes. Bilateral hydroceles. Atherosclerotic vascular calcifications are present. IMPRESSION: 1. No acute fracture or dislocation of the right hip. 2. Bilateral hydroceles. Electronically Signed   By: Davina Poke D.O.   On: 01/21/2021 20:22   DG C-Arm 1-60 Min  Result Date: 01/17/2021 CLINICAL DATA:  Kyphoplasty. EXAM: DG C-ARM 1-60 MIN; LUMBAR SPINE - 2-3 VIEW FLUOROSCOPY TIME:  Fluoroscopy Time:  2 minutes and 2 seconds. Number of Acquired Spot Images: 7 COMPARISON:  MRI lumbar spine 01/13/2021. FINDINGS: Seven C-arm fluoroscopic images were obtained intraoperatively and submitted for post operative interpretation. These images demonstrate kyphoplasty of the L3 vertebral body with slight lateral extravasation. Please see the performing provider's procedural report for further detail. IMPRESSION: Intraoperative fluoroscopy, as detailed above. Electronically Signed   By: Margaretha Sheffield M.D.   On: 01/17/2021 17:01   DG Hip Unilat  With Pelvis 2-3 Views Right  Result Date: 01/21/2021 CLINICAL DATA:  Right hip pain. History of metastatic non-small cell lung cancer EXAM: DG HIP (WITH OR WITHOUT PELVIS) 2-3V RIGHT COMPARISON:  MRI pelvis 01/13/2021 FINDINGS: No acute fracture or dislocation. Right hip joint space is preserved. Known destructive lesion at the left iliac crest is not well seen, partially obscured by overlying stool and bowel gas. Small marrow replacing lesion of the right iliac crest seen on recent MRI is not well demonstrated radiographically. Otherwise, no evidence of a new destructive bone  lesion. IMPRESSION: 1. No acute fracture or dislocation. 2. Known metastatic lesions within the bilateral iliac crests are not well  seen radiographically. Electronically Signed   By: Davina Poke D.O.   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  Plan is to continue treatment with maintenance immunotherapy using Keytruda with first dose scheduled for February 01, 2022.  Patient has been instructed to keep this appointment as scheduled. Intractable pain: Patient states his shoulder and back pain have nearly resolved, but he continues to have significant right hip pain.  This is likely secondary to metastatic lesion.  Patient completed 2 of 10 XRT treatments, but states he can no longer tolerate lying on the table and radiation oncology.  Recommend giving 1 to 2 mg IV Dilaudid immediately prior to transporting patient to XRT to maximize pain control during treatments.  Continue fentanyl patch, gabapentin, and oxycodone as prescribed. Anemia: Patient's hemoglobin is trended down to 8.3, monitor. Hyponatremia: Chronic and unchanged.  Patient's most recent sodium level is 131. Hypocalcemia: Mild, but corrected calcium with patient's decreased albumin is essentially normal.  Can consider Zometa in the future. Anxiety: Patient appears to have increased anxiety related to his pain.  He may benefit from alprazolam 0.25 mg as needed.  This has been ordered.  Appreciate consult, will follow.   Lloyd Huger, MD   01/24/2021 4:55 PM

## 2021-01-24 NOTE — Progress Notes (Signed)
PROGRESS NOTE    Donald Lowery  OVF:[id number] DOB: 1950/05/08 DOA: 01/21/2021 PCP: [name], NP    Brief Narrative:  70 y.o. 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  The patient underwent l kyphoplasty for pathological L3 closed wedge compression fracture 3 days ago.  Over the last few days he sustained mechanical falls on his right hip.  Today he was not able to stand on or to sit from pain.  He denies any presyncope or syncope.  No headache or dizziness or blurred vision.  No paresthesias or focal muscle weakness.  CT imaging survey of lumbar spine and right hip negative for fracture or dislocation.  Pain control is been a challenge.  This despite escalating narcotic and nonnarcotic regimen.  Patient was unable to tolerate radiation therapy on 9/26.  Pending oncology follow-up.   Assessment & Plan:   Active Problems:   Intractable pain   Right hip pain  Right hip pain Recurrent mechanical falls Inability to ambulate Patient status post kyphoplasty and lumbar fusion X-ray right hip negative for fracture or dislocation Pain likely pathologic secondary to metastatic lung cancer Pain control is been challenging Per patient he was unable to tolerate radiation on 9/26 Plan: Multimodal pain control Escalate nonnarcotic and narcotic regimen Oncology follow-up Continue therapy evaluations as able  Hyponatremia Appears chronic and asymptomatic Continue to monitor  Small cell lung cancer stage IV Diffuse pain secondary to above Patient under treatment with oncology Has known bony metastases and diffuse pain secondary to  this Plan: Multimodal pain control Oncology made aware  Hyperlipidemia Will hold statin for now Myalgias may be contributing to diffuse pain Lipid panel reassuring CK normal Plan: Lipitor discontinued Consider not restarting on discharge  Coronary artery disease Continue Imdur Lopressor and aspirin Statin on hold as above  Vitamin B12 deficiency PTA B12 supplementation  GERD PTA Carafate  DVT prophylaxis: SQ Lovenox Code Status: Full Family Communication: Patient's spouse Donald Lowery [phone].  Left VM on 9/27. Disposition Plan:Status is: Inpatient  Remains inpatient appropriate because:Ongoing active pain requiring inpatient pain management  Dispo: The patient is from: Home              Anticipated d/c is to: SNF              Patient currently is not medically stable to d/c.   Difficult to place patient No  Patient remains in a significant amount of pain.  Likely etiology of pain is bony metastatic deposits in setting of known metastatic small cell lung cancer.  We will continue to titrate pain regimen to achieve good pain control.  Discharge once pain control achieved.         Level of care: Med-Surg  Consultants:  None  Procedures:  None  Antimicrobials:  None   Subjective: Seen and examined.  Reports persistent pain in right hip.  Objective: Vitals:   01/23/21 2022 01/24/21 0452 01/24/21 0758 01/24/21 0954  BP: 135/89 (!) 125/59 (!) 111/93 (!) 112/59  Pulse: 84 71 80 75  Resp: 18 18 18    Temp: 98.4 F (36.9 C) 97.7 F (36.5 C) 97.8 F (36.6 C)   TempSrc:      SpO2:  96% 99% 100%     Intake/Output Summary (Last 24 hours) at 01/24/2021 1110 Last data filed at 01/24/2021 0457 Gross per 24 hour  Intake 240 ml  Output 450 ml  Net -210 ml   There were no vitals filed for this visit.  Examination:  General exam: Lying in bed.  Mild distress when trying to shift positions Respiratory system: Lungs clear.  Normal work of breathing.   Room air Cardiovascular system: S1-S2, RRR, no murmurs, no pedal edema Gastrointestinal system: Thin, nontender, nondistended, normal bowel sounds Central nervous system: Alert and oriented. No focal neurological deficits. Extremities: Pain to right hip.  Decreased range of motion bilateral lower extremities Skin: No rashes, lesions or ulcers Psychiatry: Judgement and insight appear normal. Mood & affect appropriate.     Data Reviewed: I have personally reviewed following labs and imaging studies  CBC: Recent Labs  Lab 01/21/21 2001 01/22/21 0444  WBC 6.8 6.2  NEUTROABS 5.7  --   HGB 9.5* 8.3*  HCT 27.7* 24.9*  MCV 86.8 87.4  PLT 263 267   Basic Metabolic Panel: Recent Labs  Lab 01/18/21 0415 01/19/21 0444 01/21/21 2001 01/22/21 0444  NA 128* 128* 128* 131*  K 4.2 4.4 4.0 3.8  CL 92* 91* 93* 96*  CO2 30 30 28 25   GLUCOSE 135* 203* 122* 97  BUN 13 15 14 12   CREATININE 0.53* 0.57* 0.60* 0.63  CALCIUM 8.4* 9.0 8.5* 8.2*  MG 1.8 1.8  --   --    GFR: Estimated Creatinine Clearance: 63.5 mL/min (by C-G formula based on SCr of 0.63 mg/dL). Liver Function Tests: Recent Labs  Lab 01/21/21 2001  AST 23  ALT 18  ALKPHOS 116  BILITOT 1.0  PROT 6.2*  ALBUMIN 2.5*   No results for input(s): LIPASE, AMYLASE in the last 168 hours. No results for input(s): AMMONIA in the last 168 hours. Coagulation Profile: No results for input(s): INR, PROTIME in the last 168 hours. Cardiac Enzymes: Recent Labs  Lab 01/22/21 1101  CKTOTAL 72   BNP (last 3 results) No results for input(s): PROBNP in the last 8760 hours. HbA1C: No results for input(s): HGBA1C in the last 72 hours. CBG: Recent Labs  Lab 01/17/21 1456 01/17/21 1626 01/18/21 0743  GLUCAP 119* 128* 130*   Lipid Profile: Recent Labs    01/23/21 0533  CHOL 66  HDL 21*  LDLCALC 33  TRIG 60  CHOLHDL 3.1   Thyroid Function Tests: No results for input(s): TSH, T4TOTAL, FREET4, T3FREE, THYROIDAB in the last  72 hours. Anemia Panel: No results for input(s): VITAMINB12, FOLATE, FERRITIN, TIBC, IRON, RETICCTPCT in the last 72 hours. Sepsis Labs: No results for input(s): PROCALCITON, LATICACIDVEN in the last 168 hours.  Recent Results (from the past 240 hour(s))  SARS CORONAVIRUS 2 (TAT 6-24 HRS) Nasopharyngeal Nasopharyngeal Swab     Status: None   Collection Time: 01/21/21  8:01 PM   Specimen: Nasopharyngeal Swab  Result Value Ref Range Status   SARS Coronavirus 2 NEGATIVE NEGATIVE Final    Comment: (NOTE) SARS-CoV-2 target nucleic acids are NOT DETECTED.  The SARS-CoV-2 RNA is generally detectable in upper and lower respiratory specimens during the acute phase of infection. Negative results do not preclude SARS-CoV-2 infection, do not rule out co-infections with other pathogens, and should not be used as the sole basis for treatment or other patient management decisions. Negative results must be combined with clinical observations, patient history, and epidemiological information. The expected result is Negative.  Fact Sheet for Patients: SugarRoll.be  Fact Sheet for Healthcare Providers: https://www.woods-mathews.com/  This test is not yet approved or cleared by the [place] FDA and  has been authorized for detection and/or diagnosis of SARS-CoV-2 by FDA under an Emergency Use Authorization (EUA). This EUA will remain  in effect (meaning this test can be used) for the duration of the COVID-19 declaration under Se ction 564(b)(1) of the Act, 21 U.S.C. section 360bbb-3(b)(1), unless the authorization is terminated or revoked sooner.  Performed at [facility], [place] [address] [place], [place] [address]          Radiology Studies: No results found.      Scheduled Meds:  acetaminophen  1,000 mg Oral TID   aspirin EC  81 mg Oral Daily   enoxaparin (LOVENOX) injection  40 mg Subcutaneous Q24H   ezetimibe  10 mg Oral  Daily   feeding supplement  237 mL Oral TID BM   fentaNYL  1 patch Transdermal Q72H   gabapentin  600 mg Oral TID   icosapent Ethyl  2 g Oral BID   isosorbide mononitrate  30 mg Oral Daily   ketorolac  15 mg Intravenous Q6H   methocarbamol  750 mg Oral QID   metoprolol tartrate  25 mg Oral BID   multivitamin with minerals  1 tablet Oral Daily   polyethylene glycol  17 g Oral Daily   senna-docusate  1 tablet Oral BID   sucralfate  0.5 g Oral TID AC   vitamin B-12  1,000 mcg Oral Daily   Continuous Infusions:  sodium chloride 75 mL/hr at 01/24/21 0655     LOS: 1 day    Time spent: 25 minutes    Sidney Ace, MD [facility] Pager [phone]  If 7PM-7AM, please contact night-coverage 01/24/2021, 11:10 AM

## 2021-01-24 NOTE — Care Management (Signed)
Patient still with substantial pain.  Will uptitrate fentanyl patch to 75 mcg.  Ralene Muskrat MD

## 2021-01-24 NOTE — Progress Notes (Signed)
OT Cancellation Note  Patient Details Name: Donald Lowery MRN: [id number] DOB: 1951/02/13   Cancelled Treatment:    Reason Eval/Treat Not Completed: Pain limiting ability to participate. Upon attempt, pt laying in bed, spouse present. Pt endorsing 10/10 pain and declining therapy this date. RN notified of pain, per pt's request. Pt agreeable to OT re-attempting next date.   Ardeth Perfect., MPH, MS, OTR/L ascom [phone] 01/24/21, 2:34 PM

## 2021-01-25 ENCOUNTER — Telehealth: Payer: Self-pay | Admitting: *Deleted

## 2021-01-25 ENCOUNTER — Ambulatory Visit: Payer: Medicare HMO

## 2021-01-25 ENCOUNTER — Encounter: Payer: Self-pay | Admitting: Internal Medicine

## 2021-01-25 DIAGNOSIS — M25551 Pain in right hip: Secondary | ICD-10-CM | POA: Diagnosis not present

## 2021-01-25 DIAGNOSIS — C3492 Malignant neoplasm of unspecified part of left bronchus or lung: Secondary | ICD-10-CM | POA: Diagnosis not present

## 2021-01-25 DIAGNOSIS — R52 Pain, unspecified: Secondary | ICD-10-CM | POA: Diagnosis not present

## 2021-01-25 NOTE — Progress Notes (Addendum)
Progress Note    CLESTON LAUTNER  JQB:[id number] DOB: 1950-10-25  DOA: 01/21/2021 PCP: [name], NP      Brief Narrative:    Medical records reviewed and are as summarized below:  CAMRY THEISS is a 70 y.o. 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  He presented to the hospital with acute right hip pain.      Assessment/Plan:   Active Problems:   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HCC)   Intractable pain   Right hip pain   Nutrition Problem: Increased nutrient needs Etiology: cancer and cancer related treatments  Signs/Symptoms: estimated needs    Severe right hip pain, recurrent mechanical fall, inability to ambulate, recent L3 kyphoplasty on 01/17/2021: Pain is still uncontrolled.  Continue analgesics as needed for pain.  His fentanyl was increased to 75 mcg daily on 01/24/2021.  Consulted palliative care to assist with pain management.  Stage IV non-small cell lung cancer with bony metastasis:  Anemia of chronic disease and chronic hyponatremia: Hemoglobin and sodium level are stable.  Lipitor had been discontinued because of myalgia.  Other comorbidities include CAD, hyperlipidemia, vitamin B12 deficiency, GERD.  Diet Order             Diet regular Room service appropriate? Yes; Fluid consistency: Thin  Diet effective now                      Consultants: Oncologist  Procedures: None    Medications:    acetaminophen  1,000 mg Oral TID   aspirin EC  81 mg Oral Daily   enoxaparin (LOVENOX) injection  40 mg Subcutaneous Q24H   ezetimibe  10 mg Oral Daily   feeding supplement  237 mL Oral TID BM   fentaNYL  1 patch Transdermal Q72H   gabapentin   600 mg Oral TID   icosapent Ethyl  2 g Oral BID   isosorbide mononitrate  30 mg Oral Daily   ketorolac  15 mg Intravenous Q6H   methocarbamol  750 mg Oral QID   metoprolol tartrate  25 mg Oral BID   multivitamin with minerals  1 tablet Oral Daily   polyethylene glycol  17 g Oral Daily   senna-docusate  1 tablet Oral BID   sucralfate  0.5 g Oral TID AC   vitamin B-12  1,000 mcg Oral Daily   Continuous Infusions:   Anti-infectives (From admission, onward)    None              Family Communication/Anticipated D/C date and plan/Code Status   DVT prophylaxis: enoxaparin (LOVENOX) injection 40 mg Start: 01/22/21 0800     Code Status: Full Code  Family Communication: Wife at the bedside Disposition Plan:    Status is: Inpatient  Remains inpatient appropriate because:Ongoing active pain requiring inpatient pain management  Dispo: The patient is from: Home              Anticipated d/c is to: Home              Patient currently is not medically stable to d/c.   Difficult to place patient No           Subjective:   C/o severe pain  in the right hip which is graded as 10/10 in severity.  He said he is unable to walk because of pain.  His wife was at the bedside.  Objective:    Vitals:   01/24/21 2337 01/25/21 0328 01/25/21 0754 01/25/21 1123  BP: 112/61 (!) 119/57 140/63 114/62  Pulse: 71 76 72 73  Resp: 17 17 18 20   Temp: 97.9 F (36.6 C) 98 F (36.7 C) 98.1 F (36.7 C) 98.4 F (36.9 C)  TempSrc:      SpO2: 99% 99% 100% 99%   No data found.   Intake/Output Summary (Last 24 hours) at 01/25/2021 1334 Last data filed at 01/25/2021 1300 Gross per 24 hour  Intake --  Output 1550 ml  Net -1550 ml   There were no vitals filed for this visit.  Exam:  GEN: NAD SKIN: Stage II sacral decubitus ulcer EYES: EOMI ENT: MMM CV: RRR PULM: CTA B ABD: soft, ND, NT, +BS CNS: AAO x 3, non focal EXT: No edema or tenderness    Pressure Injury  01/13/21 Sacrum Stage 2 -  Partial thickness loss of dermis presenting as a shallow open injury with a red, pink wound bed without slough. (Active)  01/13/21 2300  Location: Sacrum  Location Orientation:   Staging: Stage 2 -  Partial thickness loss of dermis presenting as a shallow open injury with a red, pink wound bed without slough.  Wound Description (Comments):   Present on Admission: Yes     Data Reviewed:   I have personally reviewed following labs and imaging studies:  Labs: Labs show the following:   Basic Metabolic Panel: Recent Labs  Lab 01/19/21 0444 01/21/21 2001 01/22/21 0444  NA 128* 128* 131*  K 4.4 4.0 3.8  CL 91* 93* 96*  CO2 30 28 25   GLUCOSE 203* 122* 97  BUN 15 14 12   CREATININE 0.57* 0.60* 0.63  CALCIUM 9.0 8.5* 8.2*  MG 1.8  --   --    GFR Estimated Creatinine Clearance: 63.5 mL/min (by C-G formula based on SCr of 0.63 mg/dL). Liver Function Tests: Recent Labs  Lab 01/21/21 2001  AST 23  ALT 18  ALKPHOS 116  BILITOT 1.0  PROT 6.2*  ALBUMIN 2.5*   No results for input(s): LIPASE, AMYLASE in the last 168 hours. No results for input(s): AMMONIA in the last 168 hours. Coagulation profile No results for input(s): INR, PROTIME in the last 168 hours.  CBC: Recent Labs  Lab 01/21/21 2001 01/22/21 0444  WBC 6.8 6.2  NEUTROABS 5.7  --   HGB 9.5* 8.3*  HCT 27.7* 24.9*  MCV 86.8 87.4  PLT 263 239   Cardiac Enzymes: Recent Labs  Lab 01/22/21 1101  CKTOTAL 72   BNP (last 3 results) No results for input(s): PROBNP in the last 8760 hours. CBG: No results for input(s): GLUCAP in the last 168 hours. D-Dimer: No results for input(s): DDIMER in the last 72 hours. Hgb A1c: No results for input(s): HGBA1C in the last 72 hours. Lipid Profile: Recent Labs    01/23/21 0533  CHOL 66  HDL 21*  LDLCALC 33  TRIG 60  CHOLHDL 3.1   Thyroid function studies: No results for input(s): TSH, T4TOTAL, T3FREE, THYROIDAB in the last 72  hours.  Invalid input(s): FREET3 Anemia work up: No results for input(s): VITAMINB12, FOLATE, FERRITIN, TIBC, IRON, RETICCTPCT in the last 72 hours. Sepsis Labs: Recent Labs  Lab 01/21/21 2001 01/22/21 0444  WBC 6.8 6.2  Microbiology Recent Results (from the past 240 hour(s))  SARS CORONAVIRUS 2 (TAT 6-24 HRS) Nasopharyngeal Nasopharyngeal Swab     Status: None   Collection Time: 01/21/21  8:01 PM   Specimen: Nasopharyngeal Swab  Result Value Ref Range Status   SARS Coronavirus 2 NEGATIVE NEGATIVE Final    Comment: (NOTE) SARS-CoV-2 target nucleic acids are NOT DETECTED.  The SARS-CoV-2 RNA is generally detectable in upper and lower respiratory specimens during the acute phase of infection. Negative results do not preclude SARS-CoV-2 infection, do not rule out co-infections with other pathogens, and should not be used as the sole basis for treatment or other patient management decisions. Negative results must be combined with clinical observations, patient history, and epidemiological information. The expected result is Negative.  Fact Sheet for Patients: SugarRoll.be  Fact Sheet for Healthcare Providers: https://www.woods-mathews.com/  This test is not yet approved or cleared by the [place] FDA and  has been authorized for detection and/or diagnosis of SARS-CoV-2 by FDA under an Emergency Use Authorization (EUA). This EUA will remain  in effect (meaning this test can be used) for the duration of the COVID-19 declaration under Se ction 564(b)(1) of the Act, 21 U.S.C. section 360bbb-3(b)(1), unless the authorization is terminated or revoked sooner.  Performed at [facility], [place] [address] [place], [place] [address]     Procedures and diagnostic studies:  No results found.             LOS: 2 days   Toriano Aikey  [facility]   Pager on [email or web address]. If 7PM-7AM, please contact  night-coverage at [email or web address]     01/25/2021, 1:34 PM

## 2021-01-25 NOTE — TOC Progression Note (Addendum)
Transition of Care [facility]) - Progression Note    Patient Details  Name: Donald Lowery MRN: [id number] Date of Birth: 12/20/1950  Transition of Care [facility]) CM/SW [place], RN Phone Number: 01/25/2021, 11:10 AM  Clinical Narrative:     The recommendation for this patient is STR SNF< However, with the patient undergoing current treatment for Cancer it is unlikely to find a STR SNF to accept the patient due to they would have to absorb the cost, Notified the patient and his wife, he is scheduled for radiation today and they are hoping he will be able to get thru it to help with the pain, he will continue Cancer Treatment and plan to go home with [facility] that is already in place       Expected Discharge Plan and Services                                                 Social Determinants of Health (SDOH) Interventions    Readmission Risk Interventions Readmission Risk Prevention Plan 01/15/2021  Transportation Screening Complete  PCP or Specialist Appt within 3-5 Days Complete  Social Work Consult for [place] Planning/Counseling Complete  Palliative Care Screening Not Applicable  Medication Review [employer]) Complete  Some recent data might be hidden

## 2021-01-25 NOTE — Telephone Encounter (Signed)
Received a message from [employer] that she needs a return call today to discuss the POC for this patient who is currently hospitalized. She states she may not be on the HIPPA, but if you call, she will put wife Sunday Spillers on the phone to give permission to speak with her

## 2021-01-25 NOTE — Progress Notes (Signed)
PT Cancellation Note  Patient Details Name: Donald Lowery MRN: [id number] DOB: 11-23-1950   Cancelled Treatment:     Attempted to see pt for skilled PT services twice today at 8:55am and 11:35am.  Pt stated he was in 10/10 R hip pain and was unable to tolerate any activity.  Pt had been given pain meds in between attempts, yet continued to refuse despite mentioning he needed to be able to walk before being able to d/c home.  Will continue at a later date per POC.   Josie Dixon 01/25/2021, 12:02 PM

## 2021-01-26 ENCOUNTER — Ambulatory Visit: Payer: Medicare HMO

## 2021-01-26 DIAGNOSIS — C7951 Secondary malignant neoplasm of bone: Secondary | ICD-10-CM | POA: Diagnosis not present

## 2021-01-26 DIAGNOSIS — R52 Pain, unspecified: Secondary | ICD-10-CM | POA: Diagnosis not present

## 2021-01-26 DIAGNOSIS — C3490 Malignant neoplasm of unspecified part of unspecified bronchus or lung: Secondary | ICD-10-CM

## 2021-01-26 DIAGNOSIS — M25551 Pain in right hip: Secondary | ICD-10-CM | POA: Diagnosis not present

## 2021-01-26 DIAGNOSIS — C3492 Malignant neoplasm of unspecified part of left bronchus or lung: Secondary | ICD-10-CM | POA: Diagnosis not present

## 2021-01-26 DIAGNOSIS — Z515 Encounter for palliative care: Secondary | ICD-10-CM

## 2021-01-26 MED ORDER — HYDROMORPHONE HCL 2 MG PO TABS
2.0000 mg | ORAL_TABLET | ORAL | Status: DC | PRN
Start: 2021-01-26 — End: 2021-01-30
  Administered 2021-01-27 – 2021-01-30 (×12): 2 mg via ORAL
  Filled 2021-01-26 (×12): qty 1

## 2021-01-26 MED ORDER — HYDROMORPHONE HCL 1 MG/ML IJ SOLN
2.0000 mg | INTRAMUSCULAR | Status: DC | PRN
Start: 2021-01-26 — End: 2021-02-04
  Administered 2021-01-26 – 2021-02-03 (×16): 2 mg via INTRAVENOUS
  Filled 2021-01-26 (×19): qty 2

## 2021-01-26 NOTE — Consult Note (Signed)
Palliative:  HPI: 70 y.o. 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  Patient was admitted to hospital 01/12/2021 with intractable low back pain.  MRI of the lumbar spine revealed L3 pathologic compression fracture.  He underwent kyphoplasty and is started on radiation therapy to this lesion.  Patient was referred to palliative care to help manage ongoing symptoms. Readmitted 01/21/21 with worsening pain in right hip due to bone mets. Palliative care requested to assist with pain management.   I met today with Donald Lowery and his wife at bedside. He has followed closely with Billey Chang, NP. Donald Lowery returns to hospital with uncontrolled pain. He shares that all his pain is in right hip and is a constant and deep, throbbing, twisting pain. He reports pain is always there but significantly worsened by any activity and unbearable with radiation. Pain is not tolerable to him. He understands that pain will not go completely aware. His goal is to control pain enough to complete radiation and pain control so that he can continue to function and ultimately return to his home. He has attempted heat pack, lidoderm patches, prednisone without relief. He is getting little to no relief from regimen listed below (changes in red) even with increase fentanyl patch he cannot tell a difference. They do report that he better tolerated radiation yesterday and I see he received dilaudid 2 mg IV vs 1 mg today that did not provide relief. They deny any concerns of lethargy or confusion at any time. LBM 9/27 and he reports BM every 2-3 days typically. Discussed high risk of severe constipation with increased medication and decreased activity and low threshold to increase senokot. Discussed changes below listed in plan and Donald Lowery and his wife in agreement with plan. Denies insomnia (except when  interrupted by pain). Appetite improving.   All questions/concerns addressed. Emotional support provided.   Exam: Alert, oriented. No distress. Thin, frail. Breathing regular, unlabored. Abd soft. Moves all extremities.   Plan: - Pain R hip deep, constant, throbbing, twisting:  - Tylenol 1000 mg TID.  - Fentanyl 75 mcg/hr patch. (Increased 50 to 75 9/26 but without increased pain relief)  - Robaxin scheduled.  - Toradol scheduled  - No relief from OxyIR. Could consider increasing dosage but may be worth opioid rotation. Will trial po dilaudid since this provides him relief through IV (although may just be the potency and quickness of IV route). Dilaudid 2 mg po every 4 hours as needed. D/C OxyIR.  - Dilaudid IV 2 mg every 3 hours for severe pain unrelieved by po meds.   - Continue radiation tx (radiating hip?)  - Consider burst high dose decadron (~10 mg IV). If relief could continue decadron 4 mg daily at least through radiation treatments.  - Bowel Regimen: Senokot-S 1 tablet daily. LBM 01/24/21. Low threshold to increase to 2 tablets if no BM today by 01/27/21.     52 min  Vinie Sill, NP Palliative Medicine Team Pager [phone] (Please see [email or web address] for schedule) Team Phone [phone]    Greater than 50%  of this time was spent counseling and coordinating care related to the above assessment and plan

## 2021-01-26 NOTE — Progress Notes (Signed)
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   OT comments  Pt seen for OT tx session, premedicated prior to maximize willingness to participate. Spouse present and encouraging of his participation. Pt initially hesitant but with encouragement from OT and spouse, agreeable to sit EOB for ADL tasks, in effort to minimize functional decline while hospitalized. Pt endorses 9/10 R hip pain and asked OT when he can have additional pain medications. RN notified promptly via secure chat and in at end of session. Pt performed bed mobility and lateral scoots without direct assist, increased time/effort and mild PRN grimacing 2/2 pain. Otherwise, no visual cues to assess pain. Pt declined additional OOB/ADL 2/2 pain. Pt continues to benefit from skilled OT services. Will continue to assess pt's functional independence and appropriateness of return home versus SNF as pt progresses and is willing to demonstrate improved independence.    Recommendations for follow up therapy are one component of a multi-disciplinary discharge planning process, led by the attending physician.  Recommendations may be updated based on patient status, additional functional criteria and insurance authorization.    Follow Up Recommendations  SNF;Other (comment) (Will re-assess next session)    Equipment Recommendations  Tub/shower seat    Recommendations for Other Services      Precautions / Restrictions  Precautions Precautions: Fall       Mobility Bed Mobility Overal bed mobility: Needs Assistance Bed Mobility: Supine to Sit;Sit to Supine     Supine to sit: Modified independent (Device/Increase time) Sit to supine: Modified independent (Device/Increase time)   General bed mobility comments: increased time/effort 2/2 pain, somewhat self limiting    Transfers Overall transfer level: Needs assistance Equipment used: None Transfers: Lateral/Scoot Transfers          Lateral/Scoot Transfers: Modified independent (Device/Increase time) General transfer comment: increased time/effort, pain    Balance Overall balance assessment: Needs assistance Sitting-balance support: Feet supported;Bilateral upper extremity supported Sitting balance-Leahy Scale: Fair                                     ADL either performed or assessed with clinical judgement   ADL Overall ADL's : Needs assistance/impaired     Grooming: Sitting;Set up Grooming Details (indicate cue type and reason): Pt sat EOB with set up for items on tray table to brush his teeth, wash his face                                     Vision       Perception     Praxis      Cognition Arousal/Alertness: Awake/alert Behavior During Therapy: WFL for tasks assessed/performed Overall Cognitive Status: Within Functional Limits for tasks assessed  Exercises     Shoulder Instructions       General Comments      Pertinent Vitals/ Pain       Pain Assessment: 0-10 Pain Score: 9  Pain Location: R hip; very minimal visual signs of severe pain, he does wince a bit with bed mobility Pain Descriptors / Indicators: Grimacing;Guarding Pain Intervention(s): Limited activity within patient's tolerance;Monitored during session;Premedicated before session;Patient requesting pain meds-RN notified  Home Living                                           Prior Functioning/Environment              Frequency  Min 1X/week        Progress Toward Goals  OT Goals(current goals can now be found in the care plan section)  Progress towards OT goals: Not progressing toward goals - comment (self limiting)  Acute Rehab OT Goals Patient Stated Goal: improve pain, go home OT Goal Formulation: With patient Time For Goal Achievement: 02/06/21 Potential to Achieve Goals: Good  Plan Frequency remains appropriate    Co-evaluation                 AM-PAC OT "6 Clicks" Daily Activity     Outcome Measure   Help from another person eating meals?: None Help from another person taking care of personal grooming?: None   Help from another person bathing (including washing, rinsing, drying)?: A Little Help from another person to put on and taking off regular upper body clothing?: None Help from another person to put on and taking off regular lower body clothing?: A Little 6 Click Score: 18    End of Session    OT Visit Diagnosis: Unsteadiness on feet (R26.81);Muscle weakness (generalized) (M62.81);Pain Pain - Right/Left: Right Pain - part of body: Hip   Activity Tolerance Patient limited by pain;Other (comment) (self limiting)   Patient Left in bed;with call bell/phone within reach;with bed alarm set;with family/visitor present   Nurse Communication Patient requests pain meds        Time: 6945-0388 OT Time Calculation (min): 19 min  Charges: OT General Charges $OT Visit: 1 Visit OT Treatments $Self Care/Home Management : 8-22 mins  Ardeth Perfect., MPH, MS, OTR/L ascom [phone] 01/26/21, 1:17 PM

## 2021-01-26 NOTE — Progress Notes (Signed)
Progress Note    Donald Lowery  DQQ:[id number] DOB: February 09, 1951  DOA: 01/21/2021 PCP: [name], NP      Brief Narrative:    Medical records reviewed and are as summarized below:  Donald Lowery is a 70 y.o. 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  He presented to the hospital with acute right hip pain.      Assessment/Plan:   Active Problems:   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HCC)   Intractable pain   Right hip pain   Nutrition Problem: Increased nutrient needs Etiology: cancer and cancer related treatments  Signs/Symptoms: estimated needs    Severe right hip pain, recurrent mechanical fall, inability to ambulate, recent L3 kyphoplasty on 01/17/2021: Pain is still uncontrolled.  Continue analgesics as needed for pain.  Consulted palliative care to assist with pain management.  Stage IV non-small cell lung cancer with bony metastasis: He had radiation therapy on 01/25/2021.  Follow-up with radiation therapist.  Outpatient follow-up with oncologist.  Anemia of chronic disease and chronic hyponatremia: Hemoglobin and sodium level are stable.  Lipitor had been discontinued because of myalgia.  Other comorbidities include CAD, hyperlipidemia, vitamin B12 deficiency, GERD.  Diet Order             Diet regular Room service appropriate? Yes; Fluid consistency: Thin  Diet effective now                      Consultants: Oncologist  Procedures: None    Medications:    acetaminophen  1,000 mg Oral TID   aspirin EC  81 mg Oral Daily   enoxaparin (LOVENOX) injection  40 mg Subcutaneous Q24H   ezetimibe  10 mg Oral Daily   feeding supplement  237 mL Oral TID  BM   fentaNYL  1 patch Transdermal Q72H   gabapentin  600 mg Oral TID   icosapent Ethyl  2 g Oral BID   isosorbide mononitrate  30 mg Oral Daily   ketorolac  15 mg Intravenous Q6H   methocarbamol  750 mg Oral QID   metoprolol tartrate  25 mg Oral BID   multivitamin with minerals  1 tablet Oral Daily   polyethylene glycol  17 g Oral Daily   senna-docusate  1 tablet Oral BID   sucralfate  0.5 g Oral TID AC   vitamin B-12  1,000 mcg Oral Daily   Continuous Infusions:   Anti-infectives (From admission, onward)    None              Family Communication/Anticipated D/C date and plan/Code Status   DVT prophylaxis: enoxaparin (LOVENOX) injection 40 mg Start: 01/22/21 0800     Code Status: Full Code  Family Communication: Wife at the bedside Disposition Plan:    Status is: Inpatient  Remains inpatient appropriate because:Ongoing active pain requiring inpatient pain management  Dispo: The patient is from: Home              Anticipated d/c is to: Home              Patient currently is not medically stable to d/c.   Difficult to place patient No           Subjective:  He complains of pain in the right hip graded as 8/10 in severity.  His wife was at the bedside.  Objective:    Vitals:   01/25/21 2019 01/26/21 0027 01/26/21 0332 01/26/21 0805  BP: 108/71 113/62 (!) 108/51 (!) 105/56  Pulse: 95 73 67 72  Resp: 16 14 16 16   Temp: 97.8 F (36.6 C) 97.9 F (36.6 C) 98.1 F (36.7 C) 98.4 F (36.9 C)  TempSrc: Oral   Oral  SpO2: 100% 100% 98% 99%   No data found.   Intake/Output Summary (Last 24 hours) at 01/26/2021 1526 Last data filed at 01/26/2021 1036 Gross per 24 hour  Intake 240 ml  Output 750 ml  Net -510 ml   There were no vitals filed for this visit.  Exam:  GEN: NAD SKIN: Warm and dry EYES: No pallor or icterus ENT: MMM CV: RRR PULM: CTA B ABD: soft, ND, NT, +BS CNS: AAO x 3, non focal EXT: Right hip tenderness without  swelling or erythema     Pressure Injury 01/13/21 Sacrum Stage 2 -  Partial thickness loss of dermis presenting as a shallow open injury with a red, pink wound bed without slough. (Active)  01/13/21 2300  Location: Sacrum  Location Orientation:   Staging: Stage 2 -  Partial thickness loss of dermis presenting as a shallow open injury with a red, pink wound bed without slough.  Wound Description (Comments):   Present on Admission: Yes     Data Reviewed:   I have personally reviewed following labs and imaging studies:  Labs: Labs show the following:   Basic Metabolic Panel: Recent Labs  Lab 01/21/21 2001 01/22/21 0444  NA 128* 131*  K 4.0 3.8  CL 93* 96*  CO2 28 25  GLUCOSE 122* 97  BUN 14 12  CREATININE 0.60* 0.63  CALCIUM 8.5* 8.2*   GFR Estimated Creatinine Clearance: 63.5 mL/min (by C-G formula based on SCr of 0.63 mg/dL). Liver Function Tests: Recent Labs  Lab 01/21/21 2001  AST 23  ALT 18  ALKPHOS 116  BILITOT 1.0  PROT 6.2*  ALBUMIN 2.5*   No results for input(s): LIPASE, AMYLASE in the last 168 hours. No results for input(s): AMMONIA in the last 168 hours. Coagulation profile No results for input(s): INR, PROTIME in the last 168 hours.  CBC: Recent Labs  Lab 01/21/21 2001 01/22/21 0444  WBC 6.8 6.2  NEUTROABS 5.7  --   HGB 9.5* 8.3*  HCT 27.7* 24.9*  MCV 86.8 87.4  PLT 263 239   Cardiac Enzymes: Recent Labs  Lab 01/22/21 1101  CKTOTAL 72   BNP (last 3 results) No results for input(s): PROBNP in the last 8760 hours. CBG: No results for input(s): GLUCAP in the last 168 hours. D-Dimer: No results for input(s): DDIMER in the last 72 hours. Hgb A1c: No results for input(s): HGBA1C in the last 72 hours. Lipid Profile: No results for input(s): CHOL, HDL, LDLCALC, TRIG, CHOLHDL, LDLDIRECT in the last 72 hours.  Thyroid function studies: No results for input(s): TSH, T4TOTAL, T3FREE, THYROIDAB in the last 72 hours.  Invalid input(s):  FREET3 Anemia work up: No results for input(s): VITAMINB12, FOLATE, FERRITIN, TIBC, IRON, RETICCTPCT in the last 72 hours. Sepsis Labs: Recent Labs  Lab 01/21/21 2001 01/22/21 0444  WBC 6.8 6.2    Microbiology Recent Results (from the past 240 hour(s))  SARS CORONAVIRUS 2 (TAT 6-24 HRS) Nasopharyngeal Nasopharyngeal Swab     Status: None   Collection Time:  01/21/21  8:01 PM   Specimen: Nasopharyngeal Swab  Result Value Ref Range Status   SARS Coronavirus 2 NEGATIVE NEGATIVE Final    Comment: (NOTE) SARS-CoV-2 target nucleic acids are NOT DETECTED.  The SARS-CoV-2 RNA is generally detectable in upper and lower respiratory specimens during the acute phase of infection. Negative results do not preclude SARS-CoV-2 infection, do not rule out co-infections with other pathogens, and should not be used as the sole basis for treatment or other patient management decisions. Negative results must be combined with clinical observations, patient history, and epidemiological information. The expected result is Negative.  Fact Sheet for Patients: SugarRoll.be  Fact Sheet for Healthcare Providers: https://www.woods-mathews.com/  This test is not yet approved or cleared by the [place] FDA and  has been authorized for detection and/or diagnosis of SARS-CoV-2 by FDA under an Emergency Use Authorization (EUA). This EUA will remain  in effect (meaning this test can be used) for the duration of the COVID-19 declaration under Se ction 564(b)(1) of the Act, 21 U.S.C. section 360bbb-3(b)(1), unless the authorization is terminated or revoked sooner.  Performed at [facility], [place] [address] [place], [place] [address]     Procedures and diagnostic studies:  No results found.             LOS: 3 days   Adonai Selsor  [facility]   Pager on [email or web address]. If 7PM-7AM, please contact night-coverage at  [email or web address]     01/26/2021, 3:26 PM

## 2021-01-26 NOTE — Care Management Important Message (Signed)
Important Message  Patient Details  Name: DEMITRIOS MOLYNEUX MRN: [id number] Date of Birth: Jun 03, 1950   Medicare Important Message Given:  Yes     Dannette Barbara 01/26/2021, 2:48 PM

## 2021-01-26 NOTE — Progress Notes (Signed)
PT Cancellation Note  Patient Details Name: Donald Lowery MRN: [id number] DOB: 06-Oct-1950   Cancelled Treatment:     PT attempt. Pt refused. Pt did receive pain medicine and recently returned from radiation. Max encouragement for participation however pt remained unwilling. Per chart review, CM said pt is planning to DC home with [facility], however when discussed with pt, "I cant go home. I have to go to rehab first.I cant walk or even stand"  I discussed that we do not know if he can stand or walk due to not willing to try. Will return in AM to assist pt with OOB activity. I discussed and encouraged him that he needs to participate weather or not going home versus rehab. "I will try. Come mid morning!"   Willette Pa 01/26/2021, 4:42 PM

## 2021-01-26 NOTE — Progress Notes (Signed)
Pt verbalizes understanding of pain scale.  He continues to c/o pain 9-10, however, no pain behaviors noted; pt demonstrates relaxed body posture and he reports receiving the best rest last night that he has had since admission.  Pt states that although his pain is a 9/10 that he is comfortable with that level of pain.

## 2021-01-26 NOTE — TOC Progression Note (Signed)
Transition of Care [facility]) - Progression Note    Patient Details  Name: Donald Lowery MRN: [id number] Date of Birth: 1950-05-22  Transition of Care [facility]) CM/SW [place], RN Phone Number: 01/26/2021, 1:12 PM  Clinical Narrative:   The patient was able to go thru with the radiation yesterday and able to get uyp with PT today More radiation planned, the patient still complains of Pain, the plan is to gop home with [facility] for [facility] when able to go home         Expected Discharge Plan and Services                                                 Social Determinants of Health (SDOH) Interventions    Readmission Risk Interventions Readmission Risk Prevention Plan 01/15/2021  Transportation Screening Complete  PCP or Specialist Appt within 3-5 Days Complete  Social Work Consult for [place] Planning/Counseling Complete  Palliative Care Screening Not Applicable  Medication Review [employer]) Complete  Some recent data might be hidden

## 2021-01-27 ENCOUNTER — Ambulatory Visit: Payer: Medicare HMO

## 2021-01-27 DIAGNOSIS — M25551 Pain in right hip: Secondary | ICD-10-CM | POA: Diagnosis not present

## 2021-01-27 DIAGNOSIS — Z7189 Other specified counseling: Secondary | ICD-10-CM

## 2021-01-27 DIAGNOSIS — C3492 Malignant neoplasm of unspecified part of left bronchus or lung: Secondary | ICD-10-CM | POA: Diagnosis not present

## 2021-01-27 MED ORDER — DEXAMETHASONE SODIUM PHOSPHATE 10 MG/ML IJ SOLN
10.0000 mg | Freq: Once | INTRAMUSCULAR | Status: AC
  Administered 2021-01-27: 10 mg via INTRAVENOUS
  Filled 2021-01-27: qty 1

## 2021-01-27 MED ORDER — SENNOSIDES-DOCUSATE SODIUM 8.6-50 MG PO TABS
2.0000 | ORAL_TABLET | Freq: Two times a day (BID) | ORAL | Status: DC
  Administered 2021-01-27 – 2021-02-04 (×15): 2 via ORAL
  Filled 2021-01-27 (×15): qty 2

## 2021-01-27 NOTE — Progress Notes (Signed)
Daily Progress Note   Patient Name: Donald Lowery       Date: 01/27/2021 DOB: 1950-11-14  Age: 70 y.o. MRN#: [id number] Attending Physician: Jennye Boroughs, MD Primary Care Physician: [name], NP Admit Date: 01/21/2021  Reason for Consultation/Follow-up: Pain control  Subjective: Patient is currently in radiation. Wife is in room. She states he is typically a man that loves to be outdoors, and will stay outside even in the winter time all day. Due to his pain, he has not only been unable to go outside, but he has also required assistance with ADL's. She states their goal is the same; that he has lessened pain, and that he is able to do ADL's himself and walk outside. She discusses that his pain seems better controlled than yesterday.  Would increase PRN oral Dilaudid if needed for better pain control.   Consider burst high dose decadron (~10 mg IV). If relief could continue decadron 4 mg daily at least through radiation treatments.    Recommend continued follow up with outpatient cancer center palliative Billey Chang NP.     Length of Stay: 4  Current Medications: Scheduled Meds:  . acetaminophen  1,000 mg Oral TID  . aspirin EC  81 mg Oral Daily  . enoxaparin (LOVENOX) injection  40 mg Subcutaneous Q24H  . ezetimibe  10 mg Oral Daily  . feeding supplement  237 mL Oral TID BM  . fentaNYL  1 patch Transdermal Q72H  . gabapentin  600 mg Oral TID  . icosapent Ethyl  2 g Oral BID  . isosorbide mononitrate  30 mg Oral Daily  . methocarbamol  750 mg Oral QID  . metoprolol tartrate  25 mg Oral BID  . multivitamin with minerals  1 tablet Oral Daily  . polyethylene glycol  17 g Oral Daily  . senna-docusate  2 tablet Oral BID  . sucralfate  0.5 g Oral TID AC  . vitamin B-12   1,000 mcg Oral Daily    Continuous Infusions:   PRN Meds: acetaminophen **OR** acetaminophen, ALPRAZolam, bisacodyl, dicyclomine, HYDROmorphone (DILAUDID) injection, HYDROmorphone, magnesium hydroxide, Muscle Rub, naloxone, ondansetron **OR** ondansetron (ZOFRAN) IV, traZODone  Physical Exam Vitals and nursing note reviewed.            Vital Signs: BP (!) 103/53 (BP Location: Left Arm)  Pulse 80   Temp 97.6 F (36.4 C)   Resp 16   Ht 5' 4.02" (1.626 m)   Wt 58.1 kg   SpO2 99%   BMI 21.98 kg/m  SpO2: SpO2: 99 % O2 Device: O2 Device: Room Air O2 Flow Rate:    Intake/output summary:  Intake/Output Summary (Last 24 hours) at 01/27/2021 1031 Last data filed at 01/27/2021 1027 Gross per 24 hour  Intake 480 ml  Output 400 ml  Net 80 ml   LBM: Last BM Date: 01/25/21 Baseline Weight: Weight: 58.1 kg Most recent weight: Weight: 58.1 kg         Patient Active Problem List   Diagnosis Date Noted  . Right hip pain 01/21/2021  . Bilateral low back pain with bilateral sciatica   . Surgery, elective   . Intractable low back pain   . Protein-calorie malnutrition, severe 01/18/2021  . Pressure injury of skin 01/14/2021  . Intractable pain 01/13/2021  . Intractable back pain 01/12/2021  . Radicular pain of shoulder (Left) 11/29/2020  . Shoulder blade pain (Left) 11/29/2020  . Cervicalgia (Left) 11/29/2020  . 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  . Hypercalcemia of malignancy 11/10/2020  . Hypertension 10/05/2020  . Hyperlipidemia 10/05/2020  . 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[place]) 09/21/2020  . 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 Cancer related pain 09/21/2020  . 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/21/2020  . Type 2 diabetes mellitus without complication, without long-term current use of insulin ([place]) 05/31/2020  . Aortic atherosclerosis ([place]) 02/10/2019  . CAD (coronary artery disease) 05/16/2018  . NSTEMI (non-ST elevated myocardial infarction) ([place]) 05/15/2018   . Emphysema of lung ([place]) 05/15/2018    Palliative Care Assessment & Plan   Recommendations/Plan: Would increase PRN oral Dilaudid if needed for better pain control.   Consider burst high dose decadron (~10 mg IV). If relief could continue decadron 4 mg daily at least through radiation treatments.    Recommend continued follow up with outpatient cancer center palliative Billey Chang NP.       Code Status:    Code Status Orders  (From admission, onward)           Start     Ordered   01/21/21 2306  Full code  Continuous        01/21/21 2307           Code Status History     Date Active Date Inactive Code Status Order ID Comments User Context   01/12/2021 1944 01/19/2021 2003 Full Code [id number]  Kayleen Memos, DO ED   05/15/2018 1910 05/17/2018 2112 Full Code [id number]  Demetrios Loll, MD Inpatient       Prognosis:  < 6 months    Thank you for allowing the Palliative Medicine Team to assist in the care of this patient.       Total Time 15 min Prolonged Time Billed  no       Greater than 50%  of this time was spent counseling and coordinating care related to the above assessment and plan.  Asencion Gowda, NP  Please contact Palliative Medicine Team phone at [phone] for questions and concerns.

## 2021-01-27 NOTE — Progress Notes (Addendum)
Progress Note    Donald Lowery  JJK:[id number] DOB: Mar 23, 1951  DOA: 01/21/2021 PCP: [name], NP      Brief Narrative:    Medical records reviewed and are as summarized below:  Donald Lowery is a 70 y.o. 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  He presented to the hospital with acute right hip pain.      Assessment/Plan:   Active Problems:   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HCC)   Intractable pain   Right hip pain   Nutrition Problem: Increased nutrient needs Etiology: cancer and cancer related treatments  Signs/Symptoms: estimated needs    Severe right hip pain, recurrent mechanical fall, inability to ambulate, recent L3 kyphoplasty on 01/17/2021: Pain is still uncontrolled.  Appreciate input from palliative care team.  Continue analgesics as needed for pain.  Add IV dexamethasone for pain control.  Stage IV non-small cell lung cancer with bony metastasis: He has been having radiation therapy since 01/25/2021.  Continue radiation therapy as prescribed by radiation oncologist. Outpatient follow-up with oncologist.  Anemia of chronic disease and chronic hyponatremia: Hemoglobin and sodium level are stable.  Lipitor had been discontinued because of myalgia.  Other comorbidities include CAD, hyperlipidemia, vitamin B12 deficiency, GERD.  He said he is not ready to go home because of right hip pain and inability to ambulate.  Diet Order             Diet regular Room service appropriate? Yes; Fluid consistency: Thin  Diet effective now                      Consultants: Oncologist  Procedures: None    Medications:    acetaminophen  1,000 mg Oral TID    aspirin EC  81 mg Oral Daily   enoxaparin (LOVENOX) injection  40 mg Subcutaneous Q24H   ezetimibe  10 mg Oral Daily   feeding supplement  237 mL Oral TID BM   fentaNYL  1 patch Transdermal Q72H   gabapentin  600 mg Oral TID   icosapent Ethyl  2 g Oral BID   isosorbide mononitrate  30 mg Oral Daily   methocarbamol  750 mg Oral QID   metoprolol tartrate  25 mg Oral BID   multivitamin with minerals  1 tablet Oral Daily   polyethylene glycol  17 g Oral Daily   senna-docusate  2 tablet Oral BID   sucralfate  0.5 g Oral TID AC   vitamin B-12  1,000 mcg Oral Daily   Continuous Infusions:   Anti-infectives (From admission, onward)    None              Family Communication/Anticipated D/C date and plan/Code Status   DVT prophylaxis: enoxaparin (LOVENOX) injection 40 mg Start: 01/22/21 0800     Code Status: Full Code  Family Communication: Wife at the bedside Disposition Plan:    Status is: Inpatient  Remains inpatient appropriate because:Ongoing active pain requiring inpatient pain management  Dispo: The patient is from: Home              Anticipated d/c is to: Home              Patient currently is not medically  stable to d/c.   Difficult to place patient No           Subjective:   Interval events noted.  He still complains of pain in the right hip.  Objective:    Vitals:   01/27/21 0500 01/27/21 0603 01/27/21 0908 01/27/21 1110  BP:  111/62 (!) 103/53 (!) 116/58  Pulse:  92 80 86  Resp:  18 16 16   Temp:  98.5 F (36.9 C) 97.6 F (36.4 C) 97.8 F (36.6 C)  TempSrc:  Oral    SpO2:  99% 99% 99%  Weight: 58.1 kg     Height:       No data found.   Intake/Output Summary (Last 24 hours) at 01/27/2021 1447 Last data filed at 01/27/2021 1432 Gross per 24 hour  Intake 480 ml  Output 400 ml  Net 80 ml   Filed Weights   01/26/21 2359 01/27/21 0500  Weight: 58.1 kg 58.1 kg    Exam:  GEN: NAD SKIN: Warm and dry EYES: EOMI ENT:  MMM CV: RRR PULM: CTA B ABD: soft, ND, NT, +BS CNS: AAO x 3, non focal EXT: Right hip tenderness. No swelling or erythema       Pressure Injury 01/13/21 Sacrum Stage 2 -  Partial thickness loss of dermis presenting as a shallow open injury with a red, pink wound bed without slough. (Active)  01/13/21 2300  Location: Sacrum  Location Orientation:   Staging: Stage 2 -  Partial thickness loss of dermis presenting as a shallow open injury with a red, pink wound bed without slough.  Wound Description (Comments):   Present on Admission: Yes     Data Reviewed:   I have personally reviewed following labs and imaging studies:  Labs: Labs show the following:   Basic Metabolic Panel: Recent Labs  Lab 01/21/21 2001 01/22/21 0444  NA 128* 131*  K 4.0 3.8  CL 93* 96*  CO2 28 25  GLUCOSE 122* 97  BUN 14 12  CREATININE 0.60* 0.63  CALCIUM 8.5* 8.2*   GFR Estimated Creatinine Clearance: 69.6 mL/min (by C-G formula based on SCr of 0.63 mg/dL). Liver Function Tests: Recent Labs  Lab 01/21/21 2001  AST 23  ALT 18  ALKPHOS 116  BILITOT 1.0  PROT 6.2*  ALBUMIN 2.5*   No results for input(s): LIPASE, AMYLASE in the last 168 hours. No results for input(s): AMMONIA in the last 168 hours. Coagulation profile No results for input(s): INR, PROTIME in the last 168 hours.  CBC: Recent Labs  Lab 01/21/21 2001 01/22/21 0444  WBC 6.8 6.2  NEUTROABS 5.7  --   HGB 9.5* 8.3*  HCT 27.7* 24.9*  MCV 86.8 87.4  PLT 263 239   Cardiac Enzymes: Recent Labs  Lab 01/22/21 1101  CKTOTAL 72   BNP (last 3 results) No results for input(s): PROBNP in the last 8760 hours. CBG: No results for input(s): GLUCAP in the last 168 hours. D-Dimer: No results for input(s): DDIMER in the last 72 hours. Hgb A1c: No results for input(s): HGBA1C in the last 72 hours. Lipid Profile: No results for input(s): CHOL, HDL, LDLCALC, TRIG, CHOLHDL, LDLDIRECT in the last 72 hours.  Thyroid function  studies: No results for input(s): TSH, T4TOTAL, T3FREE, THYROIDAB in the last 72 hours.  Invalid input(s): FREET3 Anemia work up: No results for input(s): VITAMINB12, FOLATE, FERRITIN, TIBC, IRON, RETICCTPCT in the last 72 hours. Sepsis Labs: Recent Labs  Lab 01/21/21 2001 01/22/21 0444  WBC  6.8 6.2    Microbiology Recent Results (from the past 240 hour(s))  SARS CORONAVIRUS 2 (TAT 6-24 HRS) Nasopharyngeal Nasopharyngeal Swab     Status: None   Collection Time: 01/21/21  8:01 PM   Specimen: Nasopharyngeal Swab  Result Value Ref Range Status   SARS Coronavirus 2 NEGATIVE NEGATIVE Final    Comment: (NOTE) SARS-CoV-2 target nucleic acids are NOT DETECTED.  The SARS-CoV-2 RNA is generally detectable in upper and lower respiratory specimens during the acute phase of infection. Negative results do not preclude SARS-CoV-2 infection, do not rule out co-infections with other pathogens, and should not be used as the sole basis for treatment or other patient management decisions. Negative results must be combined with clinical observations, patient history, and epidemiological information. The expected result is Negative.  Fact Sheet for Patients: SugarRoll.be  Fact Sheet for Healthcare Providers: https://www.woods-mathews.com/  This test is not yet approved or cleared by the [place] FDA and  has been authorized for detection and/or diagnosis of SARS-CoV-2 by FDA under an Emergency Use Authorization (EUA). This EUA will remain  in effect (meaning this test can be used) for the duration of the COVID-19 declaration under Se ction 564(b)(1) of the Act, 21 U.S.C. section 360bbb-3(b)(1), unless the authorization is terminated or revoked sooner.  Performed at [facility], [place] [address], [place], [place] [address]     Procedures and diagnostic studies:  No results found.             LOS: 4 days   Keoni Havey  [facility]   Pager on [email or web address]. If 7PM-7AM, please contact night-coverage at [email or web address]     01/27/2021, 2:47 PM

## 2021-01-27 NOTE — Progress Notes (Signed)
OT Cancellation Note  Patient Details Name: Donald Lowery MRN: [id number] DOB: [birth year]   Cancelled Treatment:    Reason Eval/Treat Not Completed: Patient at procedure or test/ unavailable. Upon attempt today, pt out of room for radiation treatment. Pt historically has declined therapy afterwards 2/2 fatigue and pain. Will re-attempt at later date/time as pt is available and attempt to time with treatments and pain medication.   Ardeth Perfect., MPH, MS, OTR/L ascom [phone] 01/27/21, 2:09 PM

## 2021-01-27 NOTE — Progress Notes (Signed)
Patient alert and oriented x 4, continues to complain of acute and chronic pains to right hip and leg. He verbalizes temporary relief with pain medication interventions. Vitals stable, no respiratory distress on room air. Will continue to monitor.

## 2021-01-27 NOTE — Progress Notes (Signed)
PT Cancellation Note  Patient Details Name: Donald Lowery MRN: [id number] DOB: [birth year]   Cancelled Treatment:     PT attempt 2 x this date. Max encouragement given however pt continues to refuse PT due to pain. " Come tomorrow when I don't have radiation!" Acute PT will continue efforts as able per current POC   Willette Pa 01/27/2021, 1:48 PM

## 2021-01-28 DIAGNOSIS — C3492 Malignant neoplasm of unspecified part of left bronchus or lung: Secondary | ICD-10-CM | POA: Diagnosis not present

## 2021-01-28 DIAGNOSIS — M25551 Pain in right hip: Secondary | ICD-10-CM | POA: Diagnosis not present

## 2021-01-28 DIAGNOSIS — R52 Pain, unspecified: Secondary | ICD-10-CM | POA: Diagnosis not present

## 2021-01-28 NOTE — Progress Notes (Addendum)
Progress Note    Donald Lowery  JJO:[id number] DOB: [birth year]  DOA: 01/21/2021 PCP: [name], NP      Brief Narrative:    Medical records reviewed and are as summarized below:  Donald Lowery is a 70 y.o. 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  He presented to the hospital with acute right hip pain.      Assessment/Plan:   Active Problems:   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HCC)   Intractable pain   Right hip pain   Nutrition Problem: Increased nutrient needs Etiology: cancer and cancer related treatments  Signs/Symptoms: estimated needs    Severe right hip pain, recurrent mechanical fall, inability to ambulate, recent L3 kyphoplasty on 01/17/2021: Continue analgesics as needed for pain.  Patient is reluctant to participate fully in physical occupational therapy sessions because of reported pain in the right hip.  Stage IV non-small cell lung cancer with bony metastasis: He has been having radiation therapy since 01/25/2021.  Continue radiation therapy as prescribed by radiation oncologist. Outpatient follow-up with oncologist.  Anemia of chronic disease and chronic hyponatremia: Hemoglobin and sodium level are stable.  Lipitor had been discontinued because of myalgia.  Other comorbidities include CAD, hyperlipidemia, vitamin B12 deficiency, GERD.  He does not want to go home today because of right hip pain and inability to ambulate.  Diet Order             Diet regular Room service appropriate? Yes; Fluid consistency: Thin  Diet effective now                      Consultants: Oncologist  Procedures: None    Medications:    acetaminophen  1,000  mg Oral TID   aspirin EC  81 mg Oral Daily   enoxaparin (LOVENOX) injection  40 mg Subcutaneous Q24H   ezetimibe  10 mg Oral Daily   feeding supplement  237 mL Oral TID BM   fentaNYL  1 patch Transdermal Q72H   gabapentin  600 mg Oral TID   icosapent Ethyl  2 g Oral BID   isosorbide mononitrate  30 mg Oral Daily   methocarbamol  750 mg Oral QID   metoprolol tartrate  25 mg Oral BID   multivitamin with minerals  1 tablet Oral Daily   polyethylene glycol  17 g Oral Daily   senna-docusate  2 tablet Oral BID   sucralfate  0.5 g Oral TID AC   vitamin B-12  1,000 mcg Oral Daily   Continuous Infusions:   Anti-infectives (From admission, onward)    None              Family Communication/Anticipated D/C date and plan/Code Status   DVT prophylaxis: enoxaparin (LOVENOX) injection 40 mg Start: 01/22/21 0800     Code Status: Full Code  Family Communication: Wife at the bedside Disposition Plan:    Status is: Inpatient  Remains inpatient appropriate because:Ongoing active pain requiring inpatient pain management  Dispo: The patient is from: Home              Anticipated d/c is to: Home              Patient currently is not medically  stable to d/c.   Difficult to place patient No           Subjective:   He complains of pain in the right hip graded as 9/10 in severity.  He had radiation therapy to the right hip yesterday.  Objective:    Vitals:   01/27/21 2338 01/28/21 0438 01/28/21 0725 01/28/21 1145  BP: 135/65 (!) 153/87 134/63 121/62  Pulse: 82 81 82 77  Resp: 18 18 15 16   Temp: 98.1 F (36.7 C) 97.7 F (36.5 C) 98.4 F (36.9 C) 98.2 F (36.8 C)  TempSrc:  Oral    SpO2: 99% 98% 98% 100%  Weight:      Height:       No data found.   Intake/Output Summary (Last 24 hours) at 01/28/2021 1233 Last data filed at 01/28/2021 1039 Gross per 24 hour  Intake 600 ml  Output 1000 ml  Net -400 ml   Filed Weights   01/26/21 2359 01/27/21 0500   Weight: 58.1 kg 58.1 kg    Exam:  GEN: NAD SKIN: No rash EYES: EOMI ENT: MMM CV: RRR PULM: CTA B ABD: soft, ND, NT, +BS CNS: AAO x 3, non focal EXT: Right hip tenderness.  No swelling or erythema        Data Reviewed:   I have personally reviewed following labs and imaging studies:  Labs: Labs show the following:   Basic Metabolic Panel: Recent Labs  Lab 01/21/21 2001 01/22/21 0444  NA 128* 131*  K 4.0 3.8  CL 93* 96*  CO2 28 25  GLUCOSE 122* 97  BUN 14 12  CREATININE 0.60* 0.63  CALCIUM 8.5* 8.2*   GFR Estimated Creatinine Clearance: 69.6 mL/min (by C-G formula based on SCr of 0.63 mg/dL). Liver Function Tests: Recent Labs  Lab 01/21/21 2001  AST 23  ALT 18  ALKPHOS 116  BILITOT 1.0  PROT 6.2*  ALBUMIN 2.5*   No results for input(s): LIPASE, AMYLASE in the last 168 hours. No results for input(s): AMMONIA in the last 168 hours. Coagulation profile No results for input(s): INR, PROTIME in the last 168 hours.  CBC: Recent Labs  Lab 01/21/21 2001 01/22/21 0444  WBC 6.8 6.2  NEUTROABS 5.7  --   HGB 9.5* 8.3*  HCT 27.7* 24.9*  MCV 86.8 87.4  PLT 263 239   Cardiac Enzymes: Recent Labs  Lab 01/22/21 1101  CKTOTAL 72   BNP (last 3 results) No results for input(s): PROBNP in the last 8760 hours. CBG: No results for input(s): GLUCAP in the last 168 hours. D-Dimer: No results for input(s): DDIMER in the last 72 hours. Hgb A1c: No results for input(s): HGBA1C in the last 72 hours. Lipid Profile: No results for input(s): CHOL, HDL, LDLCALC, TRIG, CHOLHDL, LDLDIRECT in the last 72 hours.  Thyroid function studies: No results for input(s): TSH, T4TOTAL, T3FREE, THYROIDAB in the last 72 hours.  Invalid input(s): FREET3 Anemia work up: No results for input(s): VITAMINB12, FOLATE, FERRITIN, TIBC, IRON, RETICCTPCT in the last 72 hours. Sepsis Labs: Recent Labs  Lab 01/21/21 2001 01/22/21 0444  WBC 6.8 6.2    Microbiology Recent  Results (from the past 240 hour(s))  SARS CORONAVIRUS 2 (TAT 6-24 HRS) Nasopharyngeal Nasopharyngeal Swab     Status: None   Collection Time: 01/21/21  8:01 PM   Specimen: Nasopharyngeal Swab  Result Value Ref Range Status   SARS Coronavirus 2 NEGATIVE NEGATIVE Final    Comment: (NOTE) SARS-CoV-2 target nucleic acids  are NOT DETECTED.  The SARS-CoV-2 RNA is generally detectable in upper and lower respiratory specimens during the acute phase of infection. Negative results do not preclude SARS-CoV-2 infection, do not rule out co-infections with other pathogens, and should not be used as the sole basis for treatment or other patient management decisions. Negative results must be combined with clinical observations, patient history, and epidemiological information. The expected result is Negative.  Fact Sheet for Patients: SugarRoll.be  Fact Sheet for Healthcare Providers: https://www.woods-mathews.com/  This test is not yet approved or cleared by the [place] FDA and  has been authorized for detection and/or diagnosis of SARS-CoV-2 by FDA under an Emergency Use Authorization (EUA). This EUA will remain  in effect (meaning this test can be used) for the duration of the COVID-19 declaration under Se ction 564(b)(1) of the Act, 21 U.S.C. section 360bbb-3(b)(1), unless the authorization is terminated or revoked sooner.  Performed at [facility], [place] [address], [place], [place] [address]     Procedures and diagnostic studies:  No results found.             LOS: 5 days   Hector Taft  [facility]   Pager on [email or web address]. If 7PM-7AM, please contact night-coverage at [email or web address]     01/28/2021, 12:33 PM

## 2021-01-28 NOTE — Progress Notes (Signed)
Physical Therapy Treatment Patient Details Name: Donald Lowery MRN: [id number] DOB: [birth year] Today's Date: 01/28/2021   History of Present Illness Pt is a 70 y/o M with PMH: stage IV non-small cell carcinoma of left upper lung (10/24/20) with bone metastasis, lytic lesion in his left iliac crest post chemotherapy and radiation, weight loss, chronic hyponatremia, HTN, previous NSTEMI, HLD, former tobacco user (quit in 2020), who presented to [facility] ED from home due to R hip pain after 2 falls at home environment after recent D/c from hospital stay.    PT Comments    Pt was long sitting in bed upon arriving. He endorses 9/10 pain at rest however demonstrates no signs/symptoms of pain throughout this session. Pt has been extremely self limiting throughout this admission. Today was willing to get OOB with minimal encouragement. He was easily and safely able to exit bed, stand to RW, and take steps to recliner. Unwilling to ambulate further distances due to report of pain. Author did issued HEP handout and pt performed without physical assistance. DC recs changed to home with [facility]. He reports having all equipment needs met at home. Acute therapy will continue to follow and progress as able per pt willingness. He was in recliner post session with RN aware of his abilities.    Recommendations for follow up therapy are one component of a multi-disciplinary discharge planning process, led by the attending physician.  Recommendations may be updated based on patient status, additional functional criteria and insurance authorization.  Follow Up Recommendations  Home health PT;Supervision/Assistance - 24 hour;Supervision for mobility/OOB     Equipment Recommendations  None recommended by PT (pt has equipment at home already)       Precautions / Restrictions Precautions Precautions: Fall Restrictions Weight Bearing Restrictions: No     Mobility  Bed Mobility Overal bed mobility: Needs Assistance Bed  Mobility: Supine to Sit   Sidelying to sit: Supervision Supine to sit: Supervision     General bed mobility comments: Pt was easily and safely able to exit bed via rolling to sidelying then short sit due to RLE hip pain. no visible pain throughout mobility,transfers, and gait.    Transfers Overall transfer level: Needs assistance Equipment used: Rolling walker (2 wheeled) Transfers: Sit to/from Stand Sit to Stand: Supervision         General transfer comment: no vcs or physical assistance required to stand from lowest bed height  Ambulation/Gait Ambulation/Gait assistance: Supervision Gait Distance (Feet): 8 Feet Assistive device: Rolling walker (2 wheeled) Gait Pattern/deviations: Step-through pattern Gait velocity: decreased   General Gait Details: no gait deficits when willing. Unwilling to ambulate > 8 ft with RW. pt demonstrates strength and mobility that would allow him to ambulate household distances if more willing to participate.     Balance Overall balance assessment: Modified Independent    Cognition Arousal/Alertness: Awake/alert Behavior During Therapy: WFL for tasks assessed/performed Overall Cognitive Status: Within Functional Limits for tasks assessed    General Comments: Pt is alert and oriented. Very self limting thus far throughout hospitalization however today was cooperative and fully participated. Unwilling to ambulate further distances but demonstrated strength and abilities to easily ambulate household distances      Exercises General Exercises - Lower Extremity Ankle Circles/Pumps: AROM;10 reps Quad Sets: AROM;10 reps Gluteal Sets: AROM;10 reps Heel Slides: AROM;10 reps Hip ABduction/ADduction: AROM;10 reps Straight Leg Raises: AROM;10 reps        Pertinent Vitals/Pain Pain Assessment: 0-10 Pain Score: 9  Pain Location: R  hip; very minimal visual signs of severe pain, he does wince a bit with bed mobility Pain Descriptors / Indicators:  Grimacing;Guarding Pain Intervention(s): Limited activity within patient's tolerance;Monitored during session;Premedicated before session;Repositioned     PT Goals (current goals can now be found in the care plan section) Acute Rehab PT Goals Patient Stated Goal: improve pain, go home Progress towards PT goals: Progressing toward goals    Frequency    Min 2X/week      PT Plan Discharge plan needs to be updated       AM-PAC PT "6 Clicks" Mobility   Outcome Measure  Help needed turning from your back to your side while in a flat bed without using bedrails?: None Help needed moving from lying on your back to sitting on the side of a flat bed without using bedrails?: None Help needed moving to and from a bed to a chair (including a wheelchair)?: A Little Help needed standing up from a chair using your arms (e.g., wheelchair or bedside chair)?: A Little Help needed to walk in hospital room?: A Little Help needed climbing 3-5 steps with a railing? : A Little 6 Click Score: 20    End of Session Equipment Utilized During Treatment: Gait belt Activity Tolerance: Patient tolerated treatment well;Other (comment) (reports severe pain however no visible signs of pain throughout session) Patient left: in chair;with call bell/phone within reach;with chair alarm set;with nursing/sitter in room Nurse Communication: Mobility status PT Visit Diagnosis: Unsteadiness on feet (R26.81);Other abnormalities of gait and mobility (R26.89);Muscle weakness (generalized) (M62.81);Difficulty in walking, not elsewhere classified (R26.2);Pain Pain - part of body: Hip     Time: 3267-1245 PT Time Calculation (min) (ACUTE ONLY): 28 min  Charges:  $Therapeutic Exercise: 8-22 mins $Therapeutic Activity: 8-22 mins                     Julaine Fusi PTA 01/28/21, 10:43 AM

## 2021-01-29 DIAGNOSIS — M25551 Pain in right hip: Secondary | ICD-10-CM | POA: Diagnosis not present

## 2021-01-29 DIAGNOSIS — C3492 Malignant neoplasm of unspecified part of left bronchus or lung: Secondary | ICD-10-CM | POA: Diagnosis not present

## 2021-01-29 LAB — CBC
HCT: 25 % — ABNORMAL LOW (ref 39.0–52.0)
Hemoglobin: 8.2 g/dL — ABNORMAL LOW (ref 13.0–17.0)
MCH: 30.1 pg (ref 26.0–34.0)
MCHC: 32.8 g/dL (ref 30.0–36.0)
MCV: 91.9 fL (ref 80.0–100.0)
Platelets: 270 10*3/uL (ref 150–400)
RBC: 2.72 MIL/uL — ABNORMAL LOW (ref 4.22–5.81)
RDW: 19.9 % — ABNORMAL HIGH (ref 11.5–15.5)
WBC: 7.8 10*3/uL (ref 4.0–10.5)
nRBC: 0.5 % — ABNORMAL HIGH (ref 0.0–0.2)

## 2021-01-29 LAB — BASIC METABOLIC PANEL
Anion gap: 8 (ref 5–15)
BUN: 17 mg/dL (ref 8–23)
CO2: 30 mmol/L (ref 22–32)
Calcium: 8.4 mg/dL — ABNORMAL LOW (ref 8.9–10.3)
Chloride: 95 mmol/L — ABNORMAL LOW (ref 98–111)
Creatinine, Ser: 0.54 mg/dL — ABNORMAL LOW (ref 0.61–1.24)
GFR, Estimated: >60 mL/min (ref >60)
Glucose, Bld: 145 mg/dL — ABNORMAL HIGH (ref 70–99)
Potassium: 4.5 mmol/L (ref 3.5–5.1)
Sodium: 133 mmol/L — ABNORMAL LOW (ref 135–145)

## 2021-01-29 MED ORDER — DEXAMETHASONE 4 MG PO TABS
4.0000 mg | ORAL_TABLET | Freq: Every day | ORAL | Status: AC
  Administered 2021-01-29 – 2021-01-31 (×3): 4 mg via ORAL
  Filled 2021-01-29 (×3): qty 1

## 2021-01-29 NOTE — Progress Notes (Signed)
Progress Note    ADE STMARIE  POE:[id number] DOB: Mar 11, 1951  DOA: 01/21/2021 PCP: [name], NP      Brief Narrative:    Medical records reviewed and are as summarized below:  Donald Lowery is a 69 y.o. 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  He presented to the hospital with acute right hip pain.      Assessment/Plan:   Active Problems:   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HCC)   Intractable pain   Right hip pain   Nutrition Problem: Increased nutrient needs Etiology: cancer and cancer related treatments  Signs/Symptoms: estimated needs    Severe right hip pain, recurrent mechanical fall, inability to ambulate, recent L3 kyphoplasty on 01/17/2021: Continue analgesics as needed for pain.  Is on fentanyl patch, IV Dilaudid, oral Dilaudid and Tylenol.  Add oral Decadron.  Stage IV non-small cell lung cancer with bony metastasis: Continue radiation therapy as prescribed by radiation oncologist. Outpatient follow-up with oncologist.  Anemia of chronic disease and chronic hyponatremia: Hemoglobin and sodium level are stable.  Lipitor had been discontinued because of myalgia.  Other comorbidities include CAD, hyperlipidemia, vitamin B12 deficiency, GERD.  He is unwilling to go home because his right hip pain is uncontrolled.    Diet Order             Diet regular Room service appropriate? Yes; Fluid consistency: Thin  Diet effective now                      Consultants: Oncologist  Procedures: None    Medications:    acetaminophen  1,000 mg Oral TID   aspirin EC  81 mg Oral Daily   enoxaparin (LOVENOX) injection  40 mg Subcutaneous Q24H    ezetimibe  10 mg Oral Daily   feeding supplement  237 mL Oral TID BM   fentaNYL  1 patch Transdermal Q72H   gabapentin  600 mg Oral TID   icosapent Ethyl  2 g Oral BID   isosorbide mononitrate  30 mg Oral Daily   methocarbamol  750 mg Oral QID   metoprolol tartrate  25 mg Oral BID   multivitamin with minerals  1 tablet Oral Daily   polyethylene glycol  17 g Oral Daily   senna-docusate  2 tablet Oral BID   sucralfate  0.5 g Oral TID AC   vitamin B-12  1,000 mcg Oral Daily   Continuous Infusions:   Anti-infectives (From admission, onward)    None              Family Communication/Anticipated D/C date and plan/Code Status   DVT prophylaxis: enoxaparin (LOVENOX) injection 40 mg Start: 01/22/21 0800     Code Status: Full Code  Family Communication: Wife at the bedside Disposition Plan:    Status is: Inpatient  Remains inpatient appropriate because:Ongoing active pain requiring inpatient pain management  Dispo: The patient is from: Home              Anticipated d/c is to: Home              Patient currently is not medically stable to d/c.   Difficult to place patient No  Subjective:   Interval events noted.  He still complains of severe pain in the right hip.  His nurse was at the bedside.  Objective:    Vitals:   01/29/21 0500 01/29/21 0755 01/29/21 1143 01/29/21 1144  BP:  (!) 141/54 (!) 102/45 (!) 102/45  Pulse:  83 82 81  Resp:  18 18 18   Temp:  98.5 F (36.9 C) 98.5 F (36.9 C) 98.5 F (36.9 C)  TempSrc:      SpO2:  98% 98% 98%  Weight: 57.1 kg     Height:       No data found.   Intake/Output Summary (Last 24 hours) at 01/29/2021 1352 Last data filed at 01/29/2021 1349 Gross per 24 hour  Intake 720 ml  Output 2450 ml  Net -1730 ml   Filed Weights   01/26/21 2359 01/27/21 0500 01/29/21 0500  Weight: 58.1 kg 58.1 kg 57.1 kg    Exam:  GEN: NAD SKIN: Warm and dry EYES: No pallor or icterus ENT: MMM CV: RRR PULM:  CTA B ABD: soft, ND, NT, +BS CNS: AAO x 3, non focal EXT: Right hip tenderness without swelling or erythema        Data Reviewed:   I have personally reviewed following labs and imaging studies:  Labs: Labs show the following:   Basic Metabolic Panel: Recent Labs  Lab 01/29/21 0537  NA 133*  K 4.5  CL 95*  CO2 30  GLUCOSE 145*  BUN 17  CREATININE 0.54*  CALCIUM 8.4*   GFR Estimated Creatinine Clearance: 68.4 mL/min (A) (by C-G formula based on SCr of 0.54 mg/dL (L)). Liver Function Tests: No results for input(s): AST, ALT, ALKPHOS, BILITOT, PROT, ALBUMIN in the last 168 hours.  No results for input(s): LIPASE, AMYLASE in the last 168 hours. No results for input(s): AMMONIA in the last 168 hours. Coagulation profile No results for input(s): INR, PROTIME in the last 168 hours.  CBC: Recent Labs  Lab 01/29/21 0537  WBC 7.8  HGB 8.2*  HCT 25.0*  MCV 91.9  PLT 270   Cardiac Enzymes: No results for input(s): CKTOTAL, CKMB, CKMBINDEX, TROPONINI in the last 168 hours.  BNP (last 3 results) No results for input(s): PROBNP in the last 8760 hours. CBG: No results for input(s): GLUCAP in the last 168 hours. D-Dimer: No results for input(s): DDIMER in the last 72 hours. Hgb A1c: No results for input(s): HGBA1C in the last 72 hours. Lipid Profile: No results for input(s): CHOL, HDL, LDLCALC, TRIG, CHOLHDL, LDLDIRECT in the last 72 hours.  Thyroid function studies: No results for input(s): TSH, T4TOTAL, T3FREE, THYROIDAB in the last 72 hours.  Invalid input(s): FREET3 Anemia work up: No results for input(s): VITAMINB12, FOLATE, FERRITIN, TIBC, IRON, RETICCTPCT in the last 72 hours. Sepsis Labs: Recent Labs  Lab 01/29/21 0537  WBC 7.8    Microbiology Recent Results (from the past 240 hour(s))  SARS CORONAVIRUS 2 (TAT 6-24 HRS) Nasopharyngeal Nasopharyngeal Swab     Status: None   Collection Time: 01/21/21  8:01 PM   Specimen: Nasopharyngeal Swab   Result Value Ref Range Status   SARS Coronavirus 2 NEGATIVE NEGATIVE Final    Comment: (NOTE) SARS-CoV-2 target nucleic acids are NOT DETECTED.  The SARS-CoV-2 RNA is generally detectable in upper and lower respiratory specimens during the acute phase of infection. Negative results do not preclude SARS-CoV-2 infection, do not rule out co-infections with other pathogens, and should not be used as the sole basis  for treatment or other patient management decisions. Negative results must be combined with clinical observations, patient history, and epidemiological information. The expected result is Negative.  Fact Sheet for Patients: SugarRoll.be  Fact Sheet for Healthcare Providers: https://www.woods-mathews.com/  This test is not yet approved or cleared by the [place] FDA and  has been authorized for detection and/or diagnosis of SARS-CoV-2 by FDA under an Emergency Use Authorization (EUA). This EUA will remain  in effect (meaning this test can be used) for the duration of the COVID-19 declaration under Se ction 564(b)(1) of the Act, 21 U.S.C. section 360bbb-3(b)(1), unless the authorization is terminated or revoked sooner.  Performed at [facility], [place] [address] [place], [place] [address]     Procedures and diagnostic studies:  No results found.             LOS: 6 days   Shakeela Rabadan  [facility]   Pager on [email or web address]. If 7PM-7AM, please contact night-coverage at [email or web address]     01/29/2021, 1:52 PM

## 2021-01-30 ENCOUNTER — Ambulatory Visit: Payer: Medicare HMO | Attending: Radiation Oncology

## 2021-01-30 DIAGNOSIS — Z510 Encounter for antineoplastic radiation therapy: Secondary | ICD-10-CM | POA: Insufficient documentation

## 2021-01-30 DIAGNOSIS — M5441 Lumbago with sciatica, right side: Secondary | ICD-10-CM

## 2021-01-30 DIAGNOSIS — M25551 Pain in right hip: Secondary | ICD-10-CM | POA: Diagnosis not present

## 2021-01-30 DIAGNOSIS — M5442 Lumbago with sciatica, left side: Secondary | ICD-10-CM | POA: Diagnosis not present

## 2021-01-30 DIAGNOSIS — C3412 Malignant neoplasm of upper lobe, left bronchus or lung: Secondary | ICD-10-CM | POA: Insufficient documentation

## 2021-01-30 DIAGNOSIS — C3492 Malignant neoplasm of unspecified part of left bronchus or lung: Secondary | ICD-10-CM | POA: Diagnosis not present

## 2021-01-30 DIAGNOSIS — Z515 Encounter for palliative care: Secondary | ICD-10-CM | POA: Diagnosis not present

## 2021-01-30 MED ORDER — HYDROMORPHONE HCL 2 MG PO TABS
4.0000 mg | ORAL_TABLET | ORAL | Status: DC | PRN
  Administered 2021-01-30: 6 mg via ORAL
  Administered 2021-01-30 – 2021-01-31 (×2): 4 mg via ORAL
  Administered 2021-01-31: 6 mg via ORAL
  Administered 2021-01-31: 4 mg via ORAL
  Administered 2021-02-02 – 2021-02-04 (×9): 6 mg via ORAL
  Filled 2021-01-30 (×3): qty 3
  Filled 2021-01-30: qty 2
  Filled 2021-01-30 (×4): qty 3
  Filled 2021-01-30: qty 2
  Filled 2021-01-30: qty 3
  Filled 2021-01-30: qty 2
  Filled 2021-01-30 (×3): qty 3

## 2021-01-30 MED ORDER — FENTANYL 100 MCG/HR TD PT72
1.0000 | MEDICATED_PATCH | TRANSDERMAL | Status: DC
  Administered 2021-01-30 – 2021-02-03 (×2): 1 via TRANSDERMAL
  Filled 2021-01-30 (×3): qty 1

## 2021-01-30 NOTE — Progress Notes (Signed)
[facility]) Hospital Liaison note:  This is a pending outpatient-based Palliative Care patient. Will continue to follow for disposition.  Please call with any outpatient palliative questions or concerns.  Thank you, Lorelee Market, LPN [facility] Liaison [phone]

## 2021-01-30 NOTE — Care Management Important Message (Signed)
Important Message  Patient Details  Name: Donald Lowery MRN: [id number] Date of Birth: [birth year]   Medicare Important Message Given:  Yes     Kimblery Diop, Leroy Sea 01/30/2021, 2:20 PM

## 2021-01-30 NOTE — Progress Notes (Signed)
Nutrition Follow-up  DOCUMENTATION CODES:  Not applicable  INTERVENTION:  Continue current diet as ordered, encourage PO intake Continue Ensure TID, each supplement provides 350 kcal and 20 grams of protein Magic cup TID with meals, each supplement provides 290 kcal and 9 grams of protein. MVI with minerals daily.  NUTRITION DIAGNOSIS:  Increased nutrient needs related to cancer and cancer related treatments as evidenced by estimated needs.  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  Pt with recent admission and dx with severe malnutrition at that time.  Continues to have struggles with managing back pain. Palliative care working to adjust medications for better control. Fair appetite noted over the last week, ~50% average intake. Routinely accepting ensure supplements.   Average Meal Intake: 9/27-10/3: 53% intake x 11 recorded meals  Nutritionally Relevant Medications: Scheduled Meds:  ezetimibe  10 mg Oral Daily   feeding supplement  237 mL Oral TID BM   multivitamin with minerals  1 tablet Oral Daily   polyethylene glycol  17 g Oral Daily   senna-docusate  2 tablet Oral BID   sucralfate  0.5 g Oral TID AC   vitamin B-12  1,000 mcg Oral Daily   PRN Meds: bisacodyl, magnesium hydroxide, ondansetron  Labs Reviewed: Na 133, Chloride 95  NUTRITION - FOCUSED PHYSICAL EXAM 9/21:  Flowsheet Row Most Recent Value  Orbital Region Moderate depletion  Upper Arm Region Moderate depletion  Thoracic and  Lumbar Region Severe depletion  Buccal Region Mild depletion  Temple Region Severe depletion  Clavicle Bone Region Severe depletion  Clavicle and Acromion Bone Region Severe depletion  Scapular Bone Region Severe depletion  Dorsal Hand Moderate depletion  Patellar Region Severe depletion  Anterior Thigh Region Severe depletion  Posterior Calf Region Severe depletion  Edema (RD Assessment) None  Hair Reviewed  Eyes Reviewed  Mouth Reviewed  Skin Reviewed  Nails Reviewed    Diet Order:   Diet Order             Diet regular Room service appropriate? Yes; Fluid consistency: Thin  Diet effective now                  EDUCATION NEEDS:  No education needs have been identified at this time  Skin:  Skin Assessment: Skin Integrity Issues: Skin Integrity Issues:: Stage II, Incisions Stage II: Pressure Injury - sacrum Incisions: Back, closed  Last BM:  10/2 - type 4  Height:  Ht Readings from Last 1 Encounters:  01/26/21 5' 4.02" (1.626 m)   Weight:  Wt Readings from Last 1 Encounters:  01/29/21 57.1 kg   BMI:  Body mass index is 21.6 kg/m.  Estimated Nutritional Needs:  Kcal:  1700-1900 Protein:  85-95 g/d Fluid:  1.8-2L/d  Ranell Patrick, RD, LDN Clinical Dietitian Pager on [place]

## 2021-01-30 NOTE — Progress Notes (Signed)
OT Cancellation Note  Patient Details Name: TIMM BONENBERGER MRN: [id number] DOB: [birth year]   Cancelled Treatment:    Reason Eval/Treat Not Completed: Other (comment). Upon initial attempt, pt with visitors, 2nd attempt pt with MD. Will re-attempt OT Tx at later date/time as pt is available.   Ardeth Perfect., MPH, MS, OTR/L ascom [phone] 01/30/21, 12:50 PM

## 2021-01-30 NOTE — Progress Notes (Addendum)
Progress Note    Donald Lowery  CHE:[id number] DOB: [birth year]  DOA: 01/21/2021 PCP: [name], NP      Brief Narrative:    Medical records reviewed and are as summarized below:  Donald Lowery is a 70 y.o. 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  He presented to the hospital with acute right hip pain.      Assessment/Plan:   Active Problems:   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HCC)   Intractable pain   Right hip pain   Nutrition Problem: Increased nutrient needs Etiology: cancer and cancer related treatments  Signs/Symptoms: estimated needs    Severe right hip pain, recurrent mechanical fall, inability to ambulate, recent L3 kyphoplasty on 01/17/2021: Continue analgesics as needed for pain.  I asked his nurse to give him IV Dilaudid for pain control.  I also explained to the patient that his right hip pain is due to cancer and cancer pain can be difficult to control at times.  I sent a message to Dr. Grayland Ormond, oncologist, via secure chart, regarding patient's uncontrolled right hip pain.  Stage IV non-small cell lung cancer with bony metastasis: Continue radiation therapy as prescribed by radiation oncologist. Outpatient follow-up with oncologist.  Anemia of chronic disease and chronic hyponatremia: Hemoglobin and sodium level are stable.  Lipitor had been discontinued because of myalgia.  Other comorbidities include CAD, hyperlipidemia, vitamin B12 deficiency, GERD.  He said he is not ready to go home because of ongoing right hip pain.    Diet Order             Diet regular Room service appropriate? Yes; Fluid consistency: Thin  Diet effective now                       Consultants: Oncologist  Procedures: None    Medications:    acetaminophen  1,000 mg Oral TID   aspirin EC  81 mg Oral Daily   dexamethasone  4 mg Oral Daily   enoxaparin (LOVENOX) injection  40 mg Subcutaneous Q24H   ezetimibe  10 mg Oral Daily   feeding supplement  237 mL Oral TID BM   fentaNYL  1 patch Transdermal Q72H   gabapentin  600 mg Oral TID   icosapent Ethyl  2 g Oral BID   isosorbide mononitrate  30 mg Oral Daily   methocarbamol  750 mg Oral QID   metoprolol tartrate  25 mg Oral BID   multivitamin with minerals  1 tablet Oral Daily   polyethylene glycol  17 g Oral Daily   senna-docusate  2 tablet Oral BID   sucralfate  0.5 g Oral TID AC   vitamin B-12  1,000 mcg Oral Daily   Continuous Infusions:   Anti-infectives (From admission, onward)    None              Family Communication/Anticipated D/C date and plan/Code Status   DVT prophylaxis: enoxaparin (LOVENOX) injection 40 mg Start: 01/22/21 0800     Code Status: Full Code  Family Communication: His sister and nephew at the bedside Disposition Plan:    Status is: Inpatient  Remains inpatient appropriate because:Ongoing active pain requiring inpatient pain management  Dispo: The patient  is from: Home              Anticipated d/c is to: Home              Patient currently is not medically stable to d/c.   Difficult to place patient No           Subjective:   Interval events noted.  He complains of right hip pain rated as 10 out of 10 in severity.  He is frustrated about lack of improvement in his pain.  His sister and nephew were at the bedside.  Objective:    Vitals:   01/30/21 0520 01/30/21 0735 01/30/21 0820 01/30/21 1111  BP: (!) 139/96 (!) 127/103 131/74 109/61  Pulse: 78 74 82 80  Resp: 18 16  16   Temp: 98.3 F (36.8 C) 98 F (36.7 C)  98.3 F (36.8 C)  TempSrc: Oral   Oral  SpO2: 98% 100%  99%  Weight:      Height:       No data  found.   Intake/Output Summary (Last 24 hours) at 01/30/2021 1221 Last data filed at 01/30/2021 0537 Gross per 24 hour  Intake 720 ml  Output 2350 ml  Net -1630 ml   Filed Weights   01/26/21 2359 01/27/21 0500 01/29/21 0500  Weight: 58.1 kg 58.1 kg 57.1 kg    Exam:  GEN: NAD SKIN: No rash EYES: EOMI ENT: MMM CV: RRR PULM: CTA B ABD: soft, ND, NT, +BS CNS: AAO x 3, non focal EXT: Right hip tenderness.  No swelling or erythema.       Data Reviewed:   I have personally reviewed following labs and imaging studies:  Labs: Labs show the following:   Basic Metabolic Panel: Recent Labs  Lab 01/29/21 0537  NA 133*  K 4.5  CL 95*  CO2 30  GLUCOSE 145*  BUN 17  CREATININE 0.54*  CALCIUM 8.4*   GFR Estimated Creatinine Clearance: 68.4 mL/min (A) (by C-G formula based on SCr of 0.54 mg/dL (L)). Liver Function Tests: No results for input(s): AST, ALT, ALKPHOS, BILITOT, PROT, ALBUMIN in the last 168 hours.  No results for input(s): LIPASE, AMYLASE in the last 168 hours. No results for input(s): AMMONIA in the last 168 hours. Coagulation profile No results for input(s): INR, PROTIME in the last 168 hours.  CBC: Recent Labs  Lab 01/29/21 0537  WBC 7.8  HGB 8.2*  HCT 25.0*  MCV 91.9  PLT 270   Cardiac Enzymes: No results for input(s): CKTOTAL, CKMB, CKMBINDEX, TROPONINI in the last 168 hours.  BNP (last 3 results) No results for input(s): PROBNP in the last 8760 hours. CBG: No results for input(s): GLUCAP in the last 168 hours. D-Dimer: No results for input(s): DDIMER in the last 72 hours. Hgb A1c: No results for input(s): HGBA1C in the last 72 hours. Lipid Profile: No results for input(s): CHOL, HDL, LDLCALC, TRIG, CHOLHDL, LDLDIRECT in the last 72 hours.  Thyroid function studies: No results for input(s): TSH, T4TOTAL, T3FREE, THYROIDAB in the last 72 hours.  Invalid input(s): FREET3 Anemia work up: No results for input(s): VITAMINB12, FOLATE,  FERRITIN, TIBC, IRON, RETICCTPCT in the last 72 hours. Sepsis Labs: Recent Labs  Lab 01/29/21 0537  WBC 7.8    Microbiology Recent Results (from the past 240 hour(s))  SARS CORONAVIRUS 2 (TAT 6-24 HRS) Nasopharyngeal Nasopharyngeal Swab     Status: None   Collection Time: 01/21/21  8:01 PM   Specimen: Nasopharyngeal  Swab  Result Value Ref Range Status   SARS Coronavirus 2 NEGATIVE NEGATIVE Final    Comment: (NOTE) SARS-CoV-2 target nucleic acids are NOT DETECTED.  The SARS-CoV-2 RNA is generally detectable in upper and lower respiratory specimens during the acute phase of infection. Negative results do not preclude SARS-CoV-2 infection, do not rule out co-infections with other pathogens, and should not be used as the sole basis for treatment or other patient management decisions. Negative results must be combined with clinical observations, patient history, and epidemiological information. The expected result is Negative.  Fact Sheet for Patients: SugarRoll.be  Fact Sheet for Healthcare Providers: https://www.woods-mathews.com/  This test is not yet approved or cleared by the [place] FDA and  has been authorized for detection and/or diagnosis of SARS-CoV-2 by FDA under an Emergency Use Authorization (EUA). This EUA will remain  in effect (meaning this test can be used) for the duration of the COVID-19 declaration under Se ction 564(b)(1) of the Act, 21 U.S.C. section 360bbb-3(b)(1), unless the authorization is terminated or revoked sooner.  Performed at [facility], [place] [address] [place], [place] [address]     Procedures and diagnostic studies:  No results found.             LOS: 7 days   Ariaunna Longsworth  [facility]   Pager on [email or web address]. If 7PM-7AM, please contact night-coverage at [email or web address]     01/30/2021, 12:21 PM

## 2021-01-30 NOTE — Progress Notes (Signed)
PT Cancellation Note  Patient Details Name: Donald Lowery MRN: [id number] DOB: 23-Feb-1951   Cancelled Treatment:    Reason Eval/Treat Not Completed: Pain limiting ability to participate;Fatigue/lethargy limiting ability to participate  Offered and encouraged session.  Pt flatly refused stating he has been in increased pain since Saturday when he sat for about 30 minutes.  Stated Pain meds had been discontinued but have been re-prescribed but he remains in pain.  Recently returned from radiation.  He did not want to participate today but did agree to attempt again tomorrow.    Chesley Noon 01/30/2021, 3:08 PM

## 2021-01-30 NOTE — Consult Note (Signed)
[place]  [phone] [phone]   Name: Donald Lowery Date: 01/30/2021 MRN: [id number]  DOB: 1950/10/28  Patient Care Team: [name], NP as PCP - General (Internal Medicine) [name], MD as PCP - Cardiology (Cardiology) Telford Nab, RN as Oncology Nurse Navigator    REASON FOR CONSULTATION: NAYEF COLLEGE is a 70 y.o. 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  Patient was recently hospitalized 01/12/2021 to 01/19/2021 with intractable back pain.  He was found to have a new compression fracture of L3 and underwent kyphoplasty.  Patient was readmitted 01/21/2021 with right hip pain.  He was started on XRT to the lumbar spine with pain felt to be referred from his known spinal metastatic disease.  Palliative care was consulted to help with pain management.  SOCIAL HISTORY:     reports that he quit smoking about 2 years ago. His smoking use included cigarettes. He has a 45.00 pack-year smoking history. He has never used smokeless tobacco. He reports that he does not drink alcohol and does not use drugs.  Patient is married and lives at home with his wife.  He had a son who is now deceased.  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  ADVANCE DIRECTIVES:  Not on file  CODE STATUS: Full code  PAST MEDICAL HISTORY: Past Medical History:  Diagnosis Date   Aortic atherosclerosis ([place])    Bochdalek hernia 09/21/2020   fatty   Coronary artery disease    a. 04/2018 NSTEMI/Cath: LM nl, LAD 50p, 10m/d diffuse-small caliber, LCX heavily Ca2+ sev prox/mid dzs, RCA 90 diff distal dzs into RPL and RPDA. EF 50-55%-->Med Rx.   Hepatic steatosis    History of 2019 novel coronavirus disease (COVID-19) 10/12/2020   History of echocardiogram    a.  04/2018 Echo: EF 55-60%, no rwma, mild MR, mildly dil LA. Nl RV fxn.   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 Quit 04/2018.    PAST SURGICAL HISTORY:  Past Surgical History:  Procedure Laterality Date   BRAIN SURGERY     CARDIAC CATHETERIZATION     IR IMAGING GUIDED PORT INSERTION  10/28/2020   KYPHOPLASTY N/A 01/17/2021   Procedure: Claybon Jabs RFA;  Surgeon: Hessie Knows, MD;  Location: [facility];  Service: Orthopedics;  Laterality: N/A;   LEFT HEART CATH AND CORONARY ANGIOGRAPHY N/A 05/16/2018   Procedure: LEFT HEART CATH AND CORONARY ANGIOGRAPHY poss PCI;  Surgeon: [name], MD;  Location: [facility];  Service: Cardiovascular;  Laterality: N/A;   VIDEO BRONCHOSCOPY WITH ENDOBRONCHIAL NAVIGATION N/A 10/24/2020   Procedure: ROBOTIC ASSISTED VIDEO BRONCHOSCOPY WITH ENDOBRONCHIAL NAVIGATION;  Surgeon: Tyler Pita, MD;  Location: [facility];  Service: Pulmonary;  Laterality: N/A;    HEMATOLOGY/ONCOLOGY HISTORY:  Oncology History  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[place])  09/21/2020 Initial Diagnosis   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[place])   10/28/2020 Cancer Staging   Staging form: Lung, AJCC 8th Edition - Clinical stage from 10/28/2020: Stage IVA (cT4, cN0,  pM1a) - Signed by Lloyd Huger, MD on 10/28/2020   11/03/2020 - 01/11/2021 Chemotherapy          02/01/2021 -  Chemotherapy    Patient is on Treatment Plan: LUNG NSCLC FLAT DOSE PEMBROLIZUMAB Q21D         ALLERGIES:  has No Known Allergies.  MEDICATIONS:  Current Facility-Administered Medications  Medication Dose Route Frequency Provider Last Rate Last Admin   acetaminophen (TYLENOL) tablet 650 mg  650 mg Oral Q6H PRN Mansy,  Jan A, MD       Or   acetaminophen (TYLENOL) suppository 650 mg  650 mg Rectal Q6H PRN Mansy, Jan A, MD       acetaminophen (TYLENOL) tablet 1,000 mg  1,000 mg Oral TID [name], MD   1,000 mg at 01/30/21 0914   ALPRAZolam Duanne Moron) tablet 0.25 mg  0.25 mg Oral BID PRN Lloyd Huger, MD   0.25 mg at 01/25/21 1435   aspirin EC tablet 81 mg  81 mg Oral Daily Mansy, Jan A, MD   81 mg at 01/30/21 0914   bisacodyl (DULCOLAX) suppository 10 mg  10 mg Rectal Daily PRN [name], MD   10 mg at 01/24/21 1441   dexamethasone (DECADRON) tablet 4 mg  4 mg Oral Daily Jennye Boroughs, MD   4 mg at 01/30/21 0915   dicyclomine (BENTYL) capsule 10 mg  10 mg Oral TID PRN Mansy, Jan A, MD       enoxaparin (LOVENOX) injection 40 mg  40 mg Subcutaneous Q24H Mansy, Jan A, MD   40 mg at 01/30/21 0819   ezetimibe (ZETIA) tablet 10 mg  10 mg Oral Daily Mansy, Jan A, MD   10 mg at 01/30/21 0915   feeding supplement (ENSURE ENLIVE / ENSURE PLUS) liquid 237 mL  237 mL Oral TID BM Mansy, Jan A, MD   237 mL at 01/30/21 0913   fentaNYL (DURAGESIC) 75 MCG/HR 1 patch  1 patch Transdermal Q72H [name], MD   1 patch at 01/27/21 1809   gabapentin (NEURONTIN) tablet 600 mg  600 mg Oral TID [name], MD   600 mg at 01/30/21 0914   HYDROmorphone (DILAUDID) injection 2 mg  2 mg Intravenous Y5W PRN Pershing Proud, NP   2 mg at 01/30/21 1027   HYDROmorphone (DILAUDID) tablet 2 mg  2 mg Oral L8L PRN Pershing Proud, NP   2 mg at 01/30/21 3734   icosapent Ethyl (VASCEPA) 1 g capsule 2 g  2 g Oral BID Mansy, Jan A, MD   2 g at 01/30/21 0915   isosorbide mononitrate (IMDUR) 24 hr tablet 30 mg  30 mg Oral Daily Mansy, Jan A, MD   30 mg at 01/30/21 0915   magnesium hydroxide (MILK OF MAGNESIA) suspension 30 mL  30 mL Oral Daily PRN Mansy, Jan A, MD       methocarbamol (ROBAXIN) tablet 750 mg  750 mg Oral QID Priscella Mann, Sudheer B, MD   750 mg at 01/30/21 0914   metoprolol tartrate (LOPRESSOR)  tablet 25 mg  25 mg Oral BID Mansy, Jan A, MD   25 mg at 01/30/21 2876   multivitamin with minerals tablet 1 tablet  1 tablet Oral Daily [name], MD   1 tablet at 01/30/21 0914   Muscle Rub CREA 1 application  1 application Topical Daily PRN Mansy, Arvella Merles, MD       naloxone [facility]) injection  0.4 mg  0.4 mg Intravenous PRN Mansy, Jan A, MD       ondansetron [facility]) tablet 4 mg  4 mg Oral Q6H PRN Mansy, Jan A, MD       Or   ondansetron [facility]) injection 4 mg  4 mg Intravenous Q6H PRN Mansy, Jan A, MD       polyethylene glycol (MIRALAX / GLYCOLAX) packet 17 g  17 g Oral Daily [name], MD   17 g at 01/30/21 0915   senna-docusate (Senokot-S) tablet 2 tablet  2 tablet Oral BID Jennye Boroughs, MD   2 tablet at 01/30/21 0914   sucralfate (CARAFATE) tablet 0.5 g  0.5 g Oral TID [facility] Mansy, Jan A, MD   0.5 g at 01/30/21 0818   traZODone (DESYREL) tablet 25 mg  25 mg Oral QHS PRN Mansy, Jan A, MD   25 mg at 01/29/21 2052   vitamin B-12 (CYANOCOBALAMIN) tablet 1,000 mcg  1,000 mcg Oral Daily Mansy, Jan A, MD   1,000 mcg at 01/30/21 0914    VITAL SIGNS: BP 109/61 (BP Location: Left Arm)   Pulse 80   Temp 98.3 F (36.8 C) (Oral)   Resp 16   Ht 5' 4.02" (1.626 m)   Wt 125 lb 14.1 oz (57.1 kg)   SpO2 99%   BMI 21.60 kg/m  Filed Weights   01/26/21 2359 01/27/21 0500 01/29/21 0500  Weight: 128 lb 1.4 oz (58.1 kg) 128 lb 1.4 oz (58.1 kg) 125 lb 14.1 oz (57.1 kg)    Estimated body mass index is 21.6 kg/m as calculated from the following:   Height as of this encounter: 5' 4.02" (1.626 m).   Weight as of this encounter: 125 lb 14.1 oz (57.1 kg).  LABS: CBC:    Component Value Date/Time   WBC 7.8 01/29/2021 0537   HGB 8.2 (L) 01/29/2021 0537   HCT 25.0 (L) 01/29/2021 0537   PLT 270 01/29/2021 0537   MCV 91.9 01/29/2021 0537   NEUTROABS 5.7 01/21/2021 2001   LYMPHSABS 0.4 (L) 01/21/2021 2001   MONOABS 0.4 01/21/2021 2001   EOSABS 0.0 01/21/2021 2001   BASOSABS 0.0  01/21/2021 2001   Comprehensive Metabolic Panel:    Component Value Date/Time   NA 133 (L) 01/29/2021 0537   NA 138 12/16/2018 0933   K 4.5 01/29/2021 0537   CL 95 (L) 01/29/2021 0537   CO2 30 01/29/2021 0537   BUN 17 01/29/2021 0537   BUN 14 12/16/2018 0933   CREATININE 0.54 (L) 01/29/2021 0537   GLUCOSE 145 (H) 01/29/2021 0537   CALCIUM 8.4 (L) 01/29/2021 0537   AST 23 01/21/2021 2001   ALT 18 01/21/2021 2001   ALKPHOS 116 01/21/2021 2001   BILITOT 1.0 01/21/2021 2001   PROT 6.2 (L) 01/21/2021 2001   ALBUMIN 2.5 (L) 01/21/2021 2001    RADIOGRAPHIC STUDIES: DG Lumbar Spine 2-3 Views  Result Date: 01/17/2021 CLINICAL DATA:  Kyphoplasty. EXAM: DG C-ARM 1-60 MIN; LUMBAR SPINE - 2-3 VIEW FLUOROSCOPY TIME:  Fluoroscopy Time:  2 minutes and 2 seconds. Number of Acquired Spot Images: 7 COMPARISON:  MRI lumbar spine 01/13/2021. FINDINGS: Seven C-arm fluoroscopic images were obtained intraoperatively and submitted for post operative interpretation. These images demonstrate kyphoplasty of the L3 vertebral body with slight lateral extravasation. Please see the performing provider's procedural report for further detail. IMPRESSION: Intraoperative fluoroscopy, as detailed above. Electronically Signed   By: Margaretha Sheffield M.D.   On: 01/17/2021 17:01   DG  Lumbar Spine 2-3 Views  Result Date: 01/12/2021 CLINICAL DATA:  Back pain, lung cancer, evaluate for metastases EXAM: LUMBAR SPINE - 2-3 VIEW COMPARISON:  01/03/2021 FINDINGS: Mild superior endplate compression fracture deformity at L3, with 15% loss of height, new from the prior. This reflects a pathologic fracture. Mild degenerative changes of the visualized thoracolumbar spine. Visualized bony pelvis appears intact. IMPRESSION: Mild superior endplate compression fracture deformity at L3, with 15% loss of height, new from the prior. This reflects a pathologic fracture. Electronically Signed   By: Julian Hy M.D.   On: 01/12/2021 20:04    DG Lumbar Spine Complete  Result Date: 01/04/2021 CLINICAL DATA:  Back pain EXAM: LUMBAR SPINE - COMPLETE 4+ VIEW COMPARISON:  None. FINDINGS: Vertebral body heights are well-maintained. Levocurvature of the lumbar spine with rotatory component. No evidence of fracture. Minimal retrolisthesis of L1 on L2 and L3 and L4. Multilevel degenerative disc disease with mild-to-moderate osteophyte formation and moderate disc space height loss at L3-L4. Mild multilevel facet arthropathy. Aortic vascular calcifications. IMPRESSION: No acute osseous abnormality. Mild-to-moderate multilevel degenerative disc disease and mild facet arthropathy. Electronically Signed   By: Yetta Glassman M.D.   On: 01/04/2021 10:24   CT Lumbar Spine Wo Contrast  Result Date: 01/21/2021 CLINICAL DATA:  Initial evaluation for worsening back pain, right hip pain, recent surgery. EXAM: CT LUMBAR SPINE WITHOUT CONTRAST TECHNIQUE: Multidetector CT imaging of the lumbar spine was performed without intravenous contrast administration. Multiplanar CT image reconstructions were also generated. COMPARISON:  MRI from 01/13/2021. FINDINGS: Segmentation: Standard. Lowest well-formed disc space labeled the L5-S1 level. Alignment: Mild sigmoid scoliotic curvature of the thoracolumbar spine. 3 mm retrolisthesis of L1 on L2. Vertebrae: There has been interval performance of vertebral augmentation for previously identified pathologic fracture at L3. No visible complicating features. Associated height loss is stable without significant interval collapse. Posterior convex bowing of the L3 vertebral body with resultant moderate spinal stenosis is grossly stable. Extraosseous extension of tumor into the adjacent right psoas musculature also grossly similar. Vertebral body height otherwise maintained with no other acute or interval fracture. Visualized sacrum intact. SI joints symmetric and normal. There is an additional lytic metastatic lesion involving the  left iliac wing (series 7, image 45). No other visible osseous metastatic disease. Paraspinal and other soft tissues: Extraosseous extension of tumor into the right psoas musculature related to the L3 metastasis, grossly stable from prior MRI. Paraspinous soft tissues demonstrate no other acute finding. Advanced aorto bi-iliac atherosclerotic disease. Disc levels: L1-2: Trace retrolisthesis with mild disc bulge. No spinal stenosis. Foramina remain patent. L2-3: Mild disc bulge with facet hypertrophy. No spinal stenosis. Foramina remain patent. L3-4: Degenerative intervertebral disc space narrowing with diffuse disc bulge. Mild facet hypertrophy. Moderate to advanced spinal stenosis related to posterior convex bowing related to the pathologic L3 compression fracture noted, grossly similar. Moderate to severe right with mild left L3 foraminal stenosis, grossly stable. L4-5: Mild disc bulge with facet hypertrophy. No spinal stenosis. No more than mild bilateral L4 foraminal narrowing. L5-S1: Mild disc bulge. Right greater than left facet arthrosis. No spinal stenosis. Foramina remain patent. IMPRESSION: 1. Interval performance of vertebral augmentation for previously identified pathologic fracture at L3. No visible complication. Associated height loss is stable without further interval collapse. Posterior convex bowing of the L3 vertebral body with resultant moderate to advanced spinal stenosis, similar to previous. Moderate to severe right L3 foraminal stenosis also grossly stable. 2. Additional lytic metastatic lesion involving the left iliac wing. 3. No  other acute abnormality within the lumbar spine. 4. Aortic Atherosclerosis (ICD10-I70.0). Electronically Signed   By: Jeannine Boga M.D.   On: 01/21/2021 20:40   MR Lumbar Spine W Wo Contrast  Result Date: 01/13/2021 CLINICAL DATA:  Spine fracture, lumbosacral, pathological new pathologic compression fracture lumbar spine EXAM: MRI LUMBAR SPINE WITHOUT AND  WITH CONTRAST TECHNIQUE: Multiplanar and multiecho pulse sequences of the lumbar spine were obtained without and with intravenous contrast. CONTRAST:  73mL GADAVIST GADOBUTROL 1 MMOL/ML IV SOLN COMPARISON:  PET-CT 12/12/2020.  Lumbar radiographs 01/12/2021. FINDINGS: Segmentation: Standard segmentation is assumed. The inferior-most fully formed intervertebral disc is labeled L5-S1. Alignment:  Substantial sagittal subluxation.  Levocurvature. Vertebrae: Abnormal enhancement, T1 hypointensity and STIR hyperintensity involving the entire L3 vertebral body and bilateral L3 pedicles, compatible with metastasis. Pathologic fracture with 50% vertebral body height loss. Extraosseous extension of tumor with right anterior paraspinal soft tissue mass measuring 4.2 cm with surrounding edema/enhancement. There also is posterior epidural extension of enhancing tumor into the canal and right foramen with associated severe canal stenosis and moderate to severe right foraminal stenosis. Conus medullaris and cauda equina: Conus extends to the L1 level. Conus appears normal Paraspinal and other soft tissues: Right anterolateral paraspinal soft tissue mass at L3 with extensive surrounding edema and enhancement, as detailed above. Disc levels: T12-L1: No significant disc protrusion, foraminal stenosis, or canal stenosis. L1-L2: Mild disc bulging without significant canal or foraminal stenosis. L2-L3: Mild disc bulging without significant canal or foraminal stenosis. L3-L4: Pathologic fracture at L3 with posterior epidural extension of tumor into the canal and right foramen, as described above. Resulting severe canal stenosis and moderate to severe right foraminal stenosis. L4-L5: Small broad disc bulge and bilateral facet hypertrophy with mild right foraminal stenosis. No significant canal or left foraminal stenosis. L5-S1: Facet hypertrophy without significant canal or foraminal stenosis. Other: Please see MRI of the pelvis for  evaluation of the pelvis/sacrum. IMPRESSION: 1. Osseous metastatic disease at L3 with pathologic fracture (50% height loss) and posterior epidural extension of tumor into the canal and right foramen. Resulting severe canal stenosis and moderate to severe right foraminal stenosis. Associated right anterolateral 4.2 cm paraspinal soft tissue mass at L3 with surrounding inflammation/edema. 2. No evidence of osseous metastatic disease in the other lumbar vertebral bodies. Please see concurrent MRI of the pelvis for evaluation of the pelvis/sacrum. Electronically Signed   By: Margaretha Sheffield M.D.   On: 01/13/2021 12:13   MR PELVIS W WO CONTRAST  Result Date: 01/13/2021 CLINICAL DATA:  70 y.o. 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 CONTRAST:  57mL GADAVIST GADOBUTROL 1 MMOL/ML IV SOLN COMPARISON:  PET-CT 12/12/2020 FINDINGS: Bones: No hip fracture, dislocation or avascular necrosis. 3.3 x 3.8 cm soft tissue mass with bone destruction involving the left iliac crest with surrounding bone marrow edema. Mild edema in the adjacent iliacus muscle which may related to radiation changes. 12 mm focus of osseous metastatic disease in the right iliac crest without a soft tissue component. 5 mm osseous metastatic disease in the S1 vertebral body. Normal sacrum and sacroiliac joints. No SI joint widening or erosive changes. Articular cartilage and labrum Articular cartilage:  No chondral defect. Labrum: Grossly intact, but evaluation  is limited by lack of intraarticular fluid. Joint or bursal effusion Joint effusion:  No hip joint effusion.  No SI joint effusion. Bursae:  No bursa  formation. Muscles and tendons Flexors: Mild perifascial edema involving the iliacus muscles bilaterally. Extensors: Normal. Abductors: Normal. Adductors: Normal. Gluteals: Normal. Hamstrings: Normal. Other findings No pelvic free fluid. No fluid collection or hematoma. No inguinal lymphadenopathy. No inguinal hernia. IMPRESSION: 1. Osseous metastatic disease involving the left iliac crest with a large soft tissue component measuring 3.3 x 3.8 cm and surrounding bone marrow edema. Mild edema in the adjacent iliacus muscle which may related to radiation changes. 12 mm focus of osseous metastatic disease in the right iliac crest. 5 mm osseous metastatic disease in the S1 vertebral body. 2. No hip fracture, dislocation or avascular necrosis. Electronically Signed   By: Kathreen Devoid M.D.   On: 01/13/2021 13:18   CT Hip Right Wo Contrast  Result Date: 01/21/2021 CLINICAL DATA:  Fall, right hip pain EXAM: CT OF THE RIGHT HIP WITHOUT CONTRAST TECHNIQUE: Multidetector CT imaging of the right hip was performed according to the standard protocol. Multiplanar CT image reconstructions were also generated. COMPARISON:  X-ray 01/21/2021 FINDINGS: Bones/Joint/Cartilage Right hip is intact without fracture or dislocation. No evidence of avascular necrosis of the femoral head. Right hip joint space is maintained. No hip joint effusion is evident. Visualized portion of the right hemipelvis is intact. Pubic symphysis and right sacroiliac joint intact without diastasis. No lytic or sclerotic bony lesions are identified. Site of known right iliac crest metastatic lesion is not included within the field of view on the current study. Ligaments Suboptimally assessed by CT. Muscles and Tendons No acute musculotendinous abnormality by CT. Soft tissues Mild subcutaneous edema overlying the greater trochanter. No organized fluid collection or hematoma. No abnormally enlarged right inguinal lymph nodes. Bilateral hydroceles. Atherosclerotic  vascular calcifications are present. IMPRESSION: 1. No acute fracture or dislocation of the right hip. 2. Bilateral hydroceles. Electronically Signed   By: Davina Poke D.O.   On: 01/21/2021 20:22   DG C-Arm 1-60 Min  Result Date: 01/17/2021 CLINICAL DATA:  Kyphoplasty. EXAM: DG C-ARM 1-60 MIN; LUMBAR SPINE - 2-3 VIEW FLUOROSCOPY TIME:  Fluoroscopy Time:  2 minutes and 2 seconds. Number of Acquired Spot Images: 7 COMPARISON:  MRI lumbar spine 01/13/2021. FINDINGS: Seven C-arm fluoroscopic images were obtained intraoperatively and submitted for post operative interpretation. These images demonstrate kyphoplasty of the L3 vertebral body with slight lateral extravasation. Please see the performing provider's procedural report for further detail. IMPRESSION: Intraoperative fluoroscopy, as detailed above. Electronically Signed   By: Margaretha Sheffield M.D.   On: 01/17/2021 17:01   DG Hip Unilat  With Pelvis 2-3 Views Right  Result Date: 01/21/2021 CLINICAL DATA:  Right hip pain. History of metastatic non-small cell lung cancer EXAM: DG HIP (WITH OR WITHOUT PELVIS) 2-3V RIGHT COMPARISON:  MRI pelvis 01/13/2021 FINDINGS: No acute fracture or dislocation. Right hip joint space is preserved. Known destructive lesion at the left iliac crest is not well seen, partially obscured by overlying stool and bowel gas. Small marrow replacing lesion of the right iliac crest seen on recent MRI is not well demonstrated radiographically. Otherwise, no evidence of a new destructive bone lesion. IMPRESSION: 1. No acute fracture or dislocation. 2. Known metastatic lesions within the bilateral iliac crests are not well seen radiographically. Electronically Signed   By: Davina Poke D.O.   On: 01/21/2021 18:25    PERFORMANCE STATUS (ECOG) : 3 -  Symptomatic, >50% confined to bed  Review of Systems Unless otherwise noted, a complete review of systems is negative.  Physical Exam General: NAD Cardiovascular: regular rate  and rhythm Pulmonary: clear ant fields Abdomen: soft, nontender, + bowel sounds GU: no suprapubic tenderness Extremities: no edema, no joint deformities Skin: no rashes Neurological: Weakness but otherwise nonfocal  IMPRESSION: Patient well-known to me from the clinic  Patient currently endorses 10 out of 10 pain in the right hip.  Note that CT of the right hip and MRI of the pelvis did not reveal any acute processes.  Agree that pain is likely referred from his known lumbar disease.  Patient is on XRT and I would expect that this should improve his pain and time.  Additionally, he is on a reasonable multimodal pain regimen including gabapentin, dexamethasone, Robaxin, and hydromorphone/fentanyl.  Transdermal fentanyl was increased on 9/27 to a 75 mcg.  Patient has continued to require frequent dosing of p.o. hydromorphone and today was restarted on IV hydromorphone for breakthrough pain.  We will plan to increase transdermal fentanyl 200 mcg every 72 hours.  Additionally, will liberalize oral hydromorphone due to poorly controlled pain.  PLAN: -Continue current scope of treatment -Increase transdermal fentanyl 100 mcg to 72 hours -Increase oral hydromorphone 4 to 6 mg every 4 hours as needed for breakthrough pain -Continue gabapentin/dexamethasone -Continue daily bowel regimen -Recommend continuing XRT -We will follow  Case and plan discussed with Dr. Grayland Ormond and Dr. Mal Misty  Time Total: 60 minutes  Visit consisted of counseling and education dealing with the complex and emotionally intense issues of symptom management and palliative care in the setting of serious and potentially life-threatening illness.Greater than 50%  of this time was spent counseling and coordinating care related to the above assessment and plan.  Signed by: Altha Harm, PhD, NP-C

## 2021-01-30 NOTE — Progress Notes (Signed)
Patient went to radiation. IV dilaudid given prior to going down.

## 2021-01-31 ENCOUNTER — Inpatient Hospital Stay: Payer: Medicare HMO

## 2021-01-31 ENCOUNTER — Telehealth: Payer: Self-pay | Admitting: Oncology

## 2021-01-31 ENCOUNTER — Ambulatory Visit: Payer: Medicare HMO

## 2021-01-31 ENCOUNTER — Ambulatory Visit: Payer: Medicare HMO | Admitting: Cardiovascular Disease

## 2021-01-31 DIAGNOSIS — M25551 Pain in right hip: Secondary | ICD-10-CM | POA: Diagnosis not present

## 2021-01-31 DIAGNOSIS — M5442 Lumbago with sciatica, left side: Secondary | ICD-10-CM | POA: Diagnosis not present

## 2021-01-31 DIAGNOSIS — M5441 Lumbago with sciatica, right side: Secondary | ICD-10-CM | POA: Diagnosis not present

## 2021-01-31 DIAGNOSIS — Z515 Encounter for palliative care: Secondary | ICD-10-CM | POA: Diagnosis not present

## 2021-01-31 DIAGNOSIS — C3492 Malignant neoplasm of unspecified part of left bronchus or lung: Secondary | ICD-10-CM | POA: Diagnosis not present

## 2021-01-31 LAB — GLUCOSE, CAPILLARY: Glucose-Capillary: 277 mg/dL — ABNORMAL HIGH (ref 70–99)

## 2021-01-31 MED ORDER — KETOROLAC TROMETHAMINE 15 MG/ML IJ SOLN
15.0000 mg | Freq: Once | INTRAMUSCULAR | Status: AC
  Administered 2021-01-31: 15 mg via INTRAVENOUS
  Filled 2021-01-31: qty 1

## 2021-01-31 MED ORDER — DEXAMETHASONE 4 MG PO TABS
4.0000 mg | ORAL_TABLET | Freq: Two times a day (BID) | ORAL | Status: DC
  Administered 2021-01-31 – 2021-02-04 (×8): 4 mg via ORAL
  Filled 2021-01-31 (×9): qty 1

## 2021-01-31 MED ORDER — DULOXETINE HCL 30 MG PO CPEP
30.0000 mg | ORAL_CAPSULE | Freq: Every day | ORAL | Status: DC
  Administered 2021-01-31 – 2021-02-04 (×4): 30 mg via ORAL
  Filled 2021-01-31 (×5): qty 1

## 2021-01-31 NOTE — Telephone Encounter (Signed)
Wife Sunday Spillers left vm on scheduling line requesting a call with Dr. Grayland Ormond and daughter in law about the patient.  Patient is currently admitted to 1A and has an appointment scheduled with Dr. Grayland Ormond on 10/5.  Routing to team for follow up.

## 2021-01-31 NOTE — Progress Notes (Signed)
OT Cancellation Note  Patient Details Name: LEELYN JASINSKI MRN: [id number] DOB: 1950/12/13   Cancelled Treatment:    Reason Eval/Treat Not Completed: Patient declined, no reason specified. Pt continues to recline therapy citing pain, despite encouragement. Will re-attempt as schedule permits.   Ardeth Perfect., MPH, MS, OTR/L ascom [phone] 01/31/21, 3:17 PM

## 2021-01-31 NOTE — Telephone Encounter (Signed)
Message left on voicemail that appointment will be cancelled at this time.

## 2021-01-31 NOTE — Progress Notes (Signed)
[facility]  [phone] [phone]   Name: Donald Lowery Date: 01/31/2021 MRN: [id number]  DOB: [birth year]  Patient Care Team: [name], NP as PCP - General (Internal Medicine) [name], MD as PCP - Cardiology (Cardiology) Telford Nab, RN as Oncology Nurse Navigator    REASON FOR CONSULTATION: Donald Lowery is a 70 y.o. 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  Patient was recently hospitalized 01/12/2021 to 01/19/2021 with intractable back pain.  He was found to have a new compression fracture of L3 and underwent kyphoplasty.  Patient was readmitted 01/21/2021 with right hip pain.  He was started on XRT to the lumbar spine with pain felt to be referred from his known spinal metastatic disease.  Palliative care was consulted to help with pain management.[name]    CODE STATUS: DNR  PAST MEDICAL HISTORY: Past Medical History:  Diagnosis Date   Aortic atherosclerosis ([place])    [place] hernia 09/21/2020   fatty   Coronary artery disease    a. 04/2018 NSTEMI/Cath: LM nl, LAD 50p, 88md diffuse-small caliber, LCX heavily Ca2+ sev prox/mid dzs, RCA 90 diff distal dzs into RPL and RPDA. EF 50-55%-->Med Rx.   Hepatic steatosis    History of 2019 novel coronavirus disease (COVID-19) 10/12/2020   History of echocardiogram    a. 04/2018 Echo: EF 55-60%, no rwma, mild MR, mildly dil LA. Nl RV fxn.   Hyperlipidemia    Hypertension    NSTEMI (non-ST elevated myocardial infarction) ([facility] 05/15/2018   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[facility] 09/21/2020   a.) 7 cm LUL mass with left subclavian/proximal vertebral encasement   Paraseptal emphysema ([facility])    T2DM (type 2 diabetes mellitus) ([facility]    Tobacco abuse    a. Quit 04/2018.    PAST SURGICAL HISTORY:  Past Surgical History:   Procedure Laterality Date   BRAIN SURGERY     CARDIAC CATHETERIZATION     IR IMAGING GUIDED PORT INSERTION  10/28/2020   KYPHOPLASTY N/A 01/17/2021   Procedure: KClaybon JabsRFA;  Surgeon: MHessie Knows MD;  Location: [facility];  Service: Orthopedics;  Laterality: N/A;   LEFT HEART CATH AND CORONARY ANGIOGRAPHY N/A 05/16/2018   Procedure: LEFT HEART CATH AND CORONARY ANGIOGRAPHY poss PCI;  Surgeon: GMinna Merritts MD;  [facility];  Service: Cardiovascular;  Laterality: N/A;   VIDEO BRONCHOSCOPY WITH ENDOBRONCHIAL NAVIGATION N/A 10/24/2020   Procedure: ROBOTIC ASSISTED VIDEO BRONCHOSCOPY WITH ENDOBRONCHIAL NAVIGATION;  Surgeon: GTyler Pita MD;  Location: [facility];  Service: Pulmonary;  Laterality: N/A;    HEMATOLOGY/ONCOLOGY HISTORY:  Oncology History  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[facility]  09/21/2020 Initial Diagnosis   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 [place]   10/28/2020 Cancer Staging   Staging form: Lung, AJCC 8th Edition - Clinical stage from 10/28/2020: Stage IVA (cT4, cN0, pM1a) - Signed by FLloyd Huger MD on 10/28/2020   11/03/2020 - 01/11/2021 Chemotherapy          02/01/2021 -  Chemotherapy    Patient is on Treatment Plan: LUNG NSCLC FLAT DOSE PEMBROLIZUMAB Q21D         ALLERGIES:  has No Known Allergies.  MEDICATIONS:  Current Facility-Administered Medications  Medication Dose Route Frequency Provider Last Rate Last Admin   acetaminophen (TYLENOL) tablet 650 mg  650 mg Oral  Q6H PRN Mansy, Jan A, MD       Or   acetaminophen (TYLENOL) suppository 650 mg  650 mg Rectal Q6H PRN Mansy, Jan A, MD       acetaminophen (TYLENOL) tablet 1,000 mg  1,000 mg Oral TID [name], MD   1,000 mg at 01/31/21 9532   ALPRAZolam Duanne Moron) tablet 0.25 mg  0.25 mg Oral BID PRN Lloyd Huger, MD   0.25 mg at 01/25/21 1435   aspirin EC tablet 81 mg  81 mg Oral Daily Mansy, Jan A, MD   81 mg at 01/31/21 0919   bisacodyl (DULCOLAX)  suppository 10 mg  10 mg Rectal Daily PRN [name], MD   10 mg at 01/24/21 1441   dexamethasone (DECADRON) tablet 4 mg  4 mg Oral Q12H Nelline Lio, [name], NP       dicyclomine (BENTYL) capsule 10 mg  10 mg Oral TID PRN Mansy, Jan A, MD       DULoxetine (CYMBALTA) DR capsule 30 mg  30 mg Oral Daily Ketzaly Cardella, [name], NP       enoxaparin (LOVENOX) injection 40 mg  40 mg Subcutaneous Q24H Mansy, Jan A, MD   40 mg at 01/31/21 0918   ezetimibe (ZETIA) tablet 10 mg  10 mg Oral Daily Mansy, Jan A, MD   10 mg at 01/31/21 0233   feeding supplement (ENSURE ENLIVE / ENSURE PLUS) liquid 237 mL  237 mL Oral TID BM Mansy, Jan A, MD   237 mL at 01/31/21 1514   fentaNYL (DURAGESIC) 100 MCG/HR 1 patch  1 patch Transdermal Q72H Reo Portela, [name], NP   1 patch at 01/30/21 1507   gabapentin (NEURONTIN) tablet 600 mg  600 mg Oral TID [name], MD   600 mg at 01/31/21 0916   HYDROmorphone (DILAUDID) injection 2 mg  2 mg Intravenous I3H PRN Pershing Proud, NP   2 mg at 01/31/21 1405   HYDROmorphone (DILAUDID) tablet 4-6 mg  4-6 mg Oral Q4H PRN Zuri Bradway, [name], NP   4 mg at 01/31/21 1048   icosapent Ethyl (VASCEPA) 1 g capsule 2 g  2 g Oral BID Mansy, Jan A, MD   2 g at 01/31/21 6861   isosorbide mononitrate (IMDUR) 24 hr tablet 30 mg  30 mg Oral Daily Mansy, Jan A, MD   30 mg at 01/31/21 6837   magnesium hydroxide (MILK OF MAGNESIA) suspension 30 mL  30 mL Oral Daily PRN Mansy, Jan A, MD       methocarbamol (ROBAXIN) tablet 750 mg  750 mg Oral QID Priscella Mann, Sudheer B, MD   750 mg at 01/31/21 1511   metoprolol tartrate (LOPRESSOR) tablet 25 mg  25 mg Oral BID Mansy, Jan A, MD   25 mg at 01/31/21 2902   multivitamin with minerals tablet 1 tablet  1 tablet Oral Daily [name], MD   1 tablet at 01/31/21 0917   Muscle Rub CREA 1 application  1 application Topical Daily PRN Mansy, Jan A, MD       naloxone [facility]) injection 0.4 mg  0.4 mg Intravenous PRN Mansy, Jan A, MD       ondansetron  [facility]) tablet 4 mg  4 mg Oral Q6H PRN Mansy, Jan A, MD       Or   ondansetron [facility]) injection 4 mg  4 mg Intravenous Q6H PRN Mansy, Arvella Merles, MD       polyethylene glycol (MIRALAX /  GLYCOLAX) packet 17 g  17 g Oral Daily [name], MD   17 g at 01/31/21 9622   senna-docusate (Senokot-S) tablet 2 tablet  2 tablet Oral BID Jennye Boroughs, MD   2 tablet at 01/31/21 0916   sucralfate (CARAFATE) tablet 0.5 g  0.5 g Oral TID [facility] Mansy, Jan A, MD   0.5 g at 01/31/21 1242   traZODone (DESYREL) tablet 25 mg  25 mg Oral QHS PRN Mansy, Jan A, MD   25 mg at 01/30/21 2114   vitamin B-12 (CYANOCOBALAMIN) tablet 1,000 mcg  1,000 mcg Oral Daily Mansy, Jan A, MD   1,000 mcg at 01/31/21 0916    VITAL SIGNS: BP 116/64 (BP Location: Right Arm)   Pulse 83   Temp 97.8 F (36.6 C)   Resp 18   Ht 5' 4.02" (1.626 m)   Wt 113 lb 5.1 oz (51.4 kg)   SpO2 100%   BMI 19.44 kg/m  Filed Weights   01/27/21 0500 01/29/21 0500 01/31/21 0500  Weight: 128 lb 1.4 oz (58.1 kg) 125 lb 14.1 oz (57.1 kg) 113 lb 5.1 oz (51.4 kg)    Estimated body mass index is 19.44 kg/m as calculated from the following:   Height as of this encounter: 5' 4.02" (1.626 m).   Weight as of this encounter: 113 lb 5.1 oz (51.4 kg).  LABS: CBC:    Component Value Date/Time   WBC 7.8 01/29/2021 0537   HGB 8.2 (L) 01/29/2021 0537   HCT 25.0 (L) 01/29/2021 0537   PLT 270 01/29/2021 0537   MCV 91.9 01/29/2021 0537   NEUTROABS 5.7 01/21/2021 2001   LYMPHSABS 0.4 (L) 01/21/2021 2001   MONOABS 0.4 01/21/2021 2001   EOSABS 0.0 01/21/2021 2001   BASOSABS 0.0 01/21/2021 2001   Comprehensive Metabolic Panel:    Component Value Date/Time   NA 133 (L) 01/29/2021 0537   NA 138 12/16/2018 0933   K 4.5 01/29/2021 0537   CL 95 (L) 01/29/2021 0537   CO2 30 01/29/2021 0537   BUN 17 01/29/2021 0537   BUN 14 12/16/2018 0933   CREATININE 0.54 (L) 01/29/2021 0537   GLUCOSE 145 (H) 01/29/2021 0537   CALCIUM 8.4 (L) 01/29/2021 0537   AST  23 01/21/2021 2001   ALT 18 01/21/2021 2001   ALKPHOS 116 01/21/2021 2001   BILITOT 1.0 01/21/2021 2001   PROT 6.2 (L) 01/21/2021 2001   ALBUMIN 2.5 (L) 01/21/2021 2001    RADIOGRAPHIC STUDIES: DG Lumbar Spine 2-3 Views  Result Date: 01/17/2021 CLINICAL DATA:  Kyphoplasty. EXAM: DG C-ARM 1-60 MIN; LUMBAR SPINE - 2-3 VIEW FLUOROSCOPY TIME:  Fluoroscopy Time:  2 minutes and 2 seconds. Number of Acquired Spot Images: 7 COMPARISON:  MRI lumbar spine 01/13/2021. FINDINGS: Seven C-arm fluoroscopic images were obtained intraoperatively and submitted for post operative interpretation. These images demonstrate kyphoplasty of the L3 vertebral body with slight lateral extravasation. Please see the performing provider's procedural report for further detail. IMPRESSION: Intraoperative fluoroscopy, as detailed above. Electronically Signed   By: Margaretha Sheffield M.D.   On: 01/17/2021 17:01   DG Lumbar Spine 2-3 Views  Result Date: 01/12/2021 CLINICAL DATA:  Back pain, lung cancer, evaluate for metastases EXAM: LUMBAR SPINE - 2-3 VIEW COMPARISON:  01/03/2021 FINDINGS: Mild superior endplate compression fracture deformity at L3, with 15% loss of height, new from the prior. This reflects a pathologic fracture. Mild degenerative changes of the visualized thoracolumbar spine. Visualized bony pelvis appears intact. IMPRESSION: Mild superior endplate  compression fracture deformity at L3, with 15% loss of height, new from the prior. This reflects a pathologic fracture. Electronically Signed   By: Julian Hy M.D.   On: 01/12/2021 20:04   DG Lumbar Spine Complete  Result Date: 01/04/2021 CLINICAL DATA:  Back pain EXAM: LUMBAR SPINE - COMPLETE 4+ VIEW COMPARISON:  None. FINDINGS: Vertebral body heights are well-maintained. Levocurvature of the lumbar spine with rotatory component. No evidence of fracture. Minimal retrolisthesis of L1 on L2 and L3 and L4. Multilevel degenerative disc disease with mild-to-moderate  osteophyte formation and moderate disc space height loss at L3-L4. Mild multilevel facet arthropathy. Aortic vascular calcifications. IMPRESSION: No acute osseous abnormality. Mild-to-moderate multilevel degenerative disc disease and mild facet arthropathy. Electronically Signed   By: Yetta Glassman M.D.   On: 01/04/2021 10:24   CT Lumbar Spine Wo Contrast  Result Date: 01/21/2021 CLINICAL DATA:  Initial evaluation for worsening back pain, right hip pain, recent surgery. EXAM: CT LUMBAR SPINE WITHOUT CONTRAST TECHNIQUE: Multidetector CT imaging of the lumbar spine was performed without intravenous contrast administration. Multiplanar CT image reconstructions were also generated. COMPARISON:  MRI from 01/13/2021. FINDINGS: Segmentation: Standard. Lowest well-formed disc space labeled the L5-S1 level. Alignment: Mild sigmoid scoliotic curvature of the thoracolumbar spine. 3 mm retrolisthesis of L1 on L2. Vertebrae: There has been interval performance of vertebral augmentation for previously identified pathologic fracture at L3. No visible complicating features. Associated height loss is stable without significant interval collapse. Posterior convex bowing of the L3 vertebral body with resultant moderate spinal stenosis is grossly stable. Extraosseous extension of tumor into the adjacent right psoas musculature also grossly similar. Vertebral body height otherwise maintained with no other acute or interval fracture. Visualized sacrum intact. SI joints symmetric and normal. There is an additional lytic metastatic lesion involving the left iliac wing (series 7, image 45). No other visible osseous metastatic disease. Paraspinal and other soft tissues: Extraosseous extension of tumor into the right psoas musculature related to the L3 metastasis, grossly stable from prior MRI. Paraspinous soft tissues demonstrate no other acute finding. Advanced aorto bi-iliac atherosclerotic disease. Disc levels: L1-2: Trace  retrolisthesis with mild disc bulge. No spinal stenosis. Foramina remain patent. L2-3: Mild disc bulge with facet hypertrophy. No spinal stenosis. Foramina remain patent. L3-4: Degenerative intervertebral disc space narrowing with diffuse disc bulge. Mild facet hypertrophy. Moderate to advanced spinal stenosis related to posterior convex bowing related to the pathologic L3 compression fracture noted, grossly similar. Moderate to severe right with mild left L3 foraminal stenosis, grossly stable. L4-5: Mild disc bulge with facet hypertrophy. No spinal stenosis. No more than mild bilateral L4 foraminal narrowing. L5-S1: Mild disc bulge. Right greater than left facet arthrosis. No spinal stenosis. Foramina remain patent. IMPRESSION: 1. Interval performance of vertebral augmentation for previously identified pathologic fracture at L3. No visible complication. Associated height loss is stable without further interval collapse. Posterior convex bowing of the L3 vertebral body with resultant moderate to advanced spinal stenosis, similar to previous. Moderate to severe right L3 foraminal stenosis also grossly stable. 2. Additional lytic metastatic lesion involving the left iliac wing. 3. No other acute abnormality within the lumbar spine. 4. Aortic Atherosclerosis (ICD10-I70.0). Electronically Signed   By: Jeannine Boga M.D.   On: 01/21/2021 20:40   MR Lumbar Spine W Wo Contrast  Result Date: 01/13/2021 CLINICAL DATA:  Spine fracture, lumbosacral, pathological new pathologic compression fracture lumbar spine EXAM: MRI LUMBAR SPINE WITHOUT AND WITH CONTRAST TECHNIQUE: Multiplanar and multiecho pulse sequences of the lumbar  spine were obtained without and with intravenous contrast. CONTRAST:  10m GADAVIST GADOBUTROL 1 MMOL/ML IV SOLN COMPARISON:  PET-CT 12/12/2020.  Lumbar radiographs 01/12/2021. FINDINGS: Segmentation: Standard segmentation is assumed. The inferior-most fully formed intervertebral disc is labeled  L5-S1. Alignment:  Substantial sagittal subluxation.  Levocurvature. Vertebrae: Abnormal enhancement, T1 hypointensity and STIR hyperintensity involving the entire L3 vertebral body and bilateral L3 pedicles, compatible with metastasis. Pathologic fracture with 50% vertebral body height loss. Extraosseous extension of tumor with right anterior paraspinal soft tissue mass measuring 4.2 cm with surrounding edema/enhancement. There also is posterior epidural extension of enhancing tumor into the canal and right foramen with associated severe canal stenosis and moderate to severe right foraminal stenosis. Conus medullaris and cauda equina: Conus extends to the L1 level. Conus appears normal Paraspinal and other soft tissues: Right anterolateral paraspinal soft tissue mass at L3 with extensive surrounding edema and enhancement, as detailed above. Disc levels: T12-L1: No significant disc protrusion, foraminal stenosis, or canal stenosis. L1-L2: Mild disc bulging without significant canal or foraminal stenosis. L2-L3: Mild disc bulging without significant canal or foraminal stenosis. L3-L4: Pathologic fracture at L3 with posterior epidural extension of tumor into the canal and right foramen, as described above. Resulting severe canal stenosis and moderate to severe right foraminal stenosis. L4-L5: Small broad disc bulge and bilateral facet hypertrophy with mild right foraminal stenosis. No significant canal or left foraminal stenosis. L5-S1: Facet hypertrophy without significant canal or foraminal stenosis. Other: Please see MRI of the pelvis for evaluation of the pelvis/sacrum. IMPRESSION: 1. Osseous metastatic disease at L3 with pathologic fracture (50% height loss) and posterior epidural extension of tumor into the canal and right foramen. Resulting severe canal stenosis and moderate to severe right foraminal stenosis. Associated right anterolateral 4.2 cm paraspinal soft tissue mass at L3 with surrounding  inflammation/edema. 2. No evidence of osseous metastatic disease in the other lumbar vertebral bodies. Please see concurrent MRI of the pelvis for evaluation of the pelvis/sacrum. Electronically Signed   By: FMargaretha SheffieldM.D.   On: 01/13/2021 12:13   MR PELVIS W WO CONTRAST  Result Date: 01/13/2021 CLINICAL DATA:  70y.o. 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 CONTRAST:  53mGADAVIST GADOBUTROL 1 MMOL/ML IV SOLN COMPARISON:  PET-CT 12/12/2020 FINDINGS: Bones: No hip fracture, dislocation or avascular necrosis. 3.3 x 3.8 cm soft tissue mass with bone destruction involving the left iliac crest with surrounding bone marrow edema. Mild edema in the adjacent iliacus muscle which may related to radiation changes. 12 mm focus of osseous metastatic disease in the right iliac crest without a soft tissue component. 5 mm osseous metastatic disease in the S1 vertebral body. Normal sacrum and sacroiliac joints. No SI joint widening or erosive changes. Articular cartilage and labrum Articular cartilage:  No chondral defect. Labrum: Grossly intact, but evaluation is limited by lack of intraarticular fluid. Joint or bursal effusion Joint effusion:  No hip joint effusion.  No SI joint effusion. Bursae:  No bursa formation. Muscles and tendons Flexors: Mild perifascial edema involving the iliacus muscles bilaterally. Extensors: Normal. Abductors: Normal. Adductors: Normal. Gluteals: Normal. Hamstrings: Normal. Other findings No pelvic free fluid. No fluid collection or hematoma. No inguinal lymphadenopathy. No inguinal hernia.  IMPRESSION: 1. Osseous metastatic disease involving the  left iliac crest with a large soft tissue component measuring 3.3 x 3.8 cm and surrounding bone marrow edema. Mild edema in the adjacent iliacus muscle which may related to radiation changes. 12 mm focus of osseous metastatic disease in the right iliac crest. 5 mm osseous metastatic disease in the S1 vertebral body. 2. No hip fracture, dislocation or avascular necrosis. Electronically Signed   By: Kathreen Devoid M.D.   On: 01/13/2021 13:18   CT Hip Right Wo Contrast  Result Date: 01/21/2021 CLINICAL DATA:  Fall, right hip pain EXAM: CT OF THE RIGHT HIP WITHOUT CONTRAST TECHNIQUE: Multidetector CT imaging of the right hip was performed according to the standard protocol. Multiplanar CT image reconstructions were also generated. COMPARISON:  X-ray 01/21/2021 FINDINGS: Bones/Joint/Cartilage Right hip is intact without fracture or dislocation. No evidence of avascular necrosis of the femoral head. Right hip joint space is maintained. No hip joint effusion is evident. Visualized portion of the right hemipelvis is intact. Pubic symphysis and right sacroiliac joint intact without diastasis. No lytic or sclerotic bony lesions are identified. Site of known right iliac crest metastatic lesion is not included within the field of view on the current study. Ligaments Suboptimally assessed by CT. Muscles and Tendons No acute musculotendinous abnormality by CT. Soft tissues Mild subcutaneous edema overlying the greater trochanter. No organized fluid collection or hematoma. No abnormally enlarged right inguinal lymph nodes. Bilateral hydroceles. Atherosclerotic vascular calcifications are present. IMPRESSION: 1. No acute fracture or dislocation of the right hip. 2. Bilateral hydroceles. Electronically Signed   By: Davina Poke D.O.   On: 01/21/2021 20:22   DG C-Arm 1-60 Min  Result Date: 01/17/2021 CLINICAL DATA:  Kyphoplasty. EXAM: DG C-ARM 1-60 MIN; LUMBAR SPINE - 2-3 VIEW FLUOROSCOPY TIME:  Fluoroscopy Time:  2 minutes  and 2 seconds. Number of Acquired Spot Images: 7 COMPARISON:  MRI lumbar spine 01/13/2021. FINDINGS: Seven C-arm fluoroscopic images were obtained intraoperatively and submitted for post operative interpretation. These images demonstrate kyphoplasty of the L3 vertebral body with slight lateral extravasation. Please see the performing provider's procedural report for further detail. IMPRESSION: Intraoperative fluoroscopy, as detailed above. Electronically Signed   By: Margaretha Sheffield M.D.   On: 01/17/2021 17:01   DG Hip Unilat  With Pelvis 2-3 Views Right  Result Date: 01/21/2021 CLINICAL DATA:  Right hip pain. History of metastatic non-small cell lung cancer EXAM: DG HIP (WITH OR WITHOUT PELVIS) 2-3V RIGHT COMPARISON:  MRI pelvis 01/13/2021 FINDINGS: No acute fracture or dislocation. Right hip joint space is preserved. Known destructive lesion at the left iliac crest is not well seen, partially obscured by overlying stool and bowel gas. Small marrow replacing lesion of the right iliac crest seen on recent MRI is not well demonstrated radiographically. Otherwise, no evidence of a new destructive bone lesion. IMPRESSION: 1. No acute fracture or dislocation. 2. Known metastatic lesions within the bilateral iliac crests are not well seen radiographically. Electronically Signed   By: Davina Poke D.O.   On: 01/21/2021 18:25    PERFORMANCE STATUS (ECOG) : 3 - Symptomatic, >50% confined to bed  Review of Systems Unless otherwise noted, a complete review of systems is negative.  Physical Exam General: NAD Cardiovascular: regular rate and rhythm Pulmonary: clear ant fields Abdomen: soft, nontender, + bowel sounds GU: no suprapubic tenderness Extremities: no edema, no joint deformities Skin: no rashes Neurological: Weakness but otherwise nonfocal  IMPRESSION: Follow-up visit.  Met with patient and wife  at family request.  Wife asked to speak candidly regarding his life expectancy, prognosis, and  expectations for his quality of life.  Unfortunately, patient has done poorly over the past couple of months and I am concerned that that pattern may persist.  At this point, he would like to continue pursuing cancer treatment but we did discuss the option of transitioning to supportive care only including option of hospice at home.  Patient says that he is not interested in being resuscitated or have his life prolonged artificially machines.  Both patient and wife are in agreement DNR/DNI.  Regarding pain, patient continues to endorse severe and persistent pain in the right buttocks.  He has not found much relief from increased fentanyl or hydromorphone.  He says that the Toradol that he received today has been the most effective thing tried today.  This is suggestive of an inflammatory component to the pain.  We will restart him on dexamethasone and liberalize to twice daily dosing.  We will also plan to start him on duloxetine as this may help with neuropathic pain as well as depression/anxiety.  I would recommend repeating MRI of the lumbar spine/right hip to rule out any acute changes since last imaging 2 weeks ago.  PLAN: -Continue current scope of treatment -Continue fentanyl/hydromorphone -Restart dexamethasone 4 mg p.o. twice daily -Start duloxetine 30 mg daily -Continue gabapentin -DNR/DNI -Recommend repeating MRI of the lumbar spine and right hip  Case and plan discussed with Dr. Grayland Ormond and Dr. Mal Misty   Time Total: 45 minutes  Visit consisted of counseling and education dealing with the complex and emotionally intense issues of symptom management and palliative care in the setting of serious and potentially life-threatening illness.Greater than 50%  of this time was spent counseling and coordinating care related to the above assessment and plan.  Signed by: Altha Harm, PhD, NP-C

## 2021-01-31 NOTE — Progress Notes (Addendum)
Progress Note    Donald Lowery  QMG:[id number] DOB: 09-25-1950  DOA: 01/21/2021 PCP: [name], NP      Brief Narrative:    Medical records reviewed and are as summarized below:  Donald Lowery is a 70 y.o. 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  He presented to the hospital with acute right hip pain.  Right hip pain was attributed to metastatic disease from lung cancer.  He was treated with analgesics.  Palliative care team was consulted to assist with pain control.  He also received palliative radiation therapy.    Assessment/Plan:   Active Problems:   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HCC)   Intractable pain   Right hip pain   Palliative care encounter   Nutrition Problem: Increased nutrient needs Etiology: cancer and cancer related treatments  Signs/Symptoms: estimated needs    Severe right hip pain, recurrent mechanical fall, inability to ambulate, recent L3 kyphoplasty on 01/17/2021: Continue analgesics as needed for pain.  Continue Decadron.  Add Toradol.  Case discussed with Dr. Grayland Ormond, oncologist.  Unfortunately he does not have many options available at this time.  Case was also discussed with Josh, NP.  MRI of the right hip will be ordered for further evaluation.  Stage IV non-small cell lung cancer with bony metastasis: Continue radiation therapy as prescribed by radiation oncologist. Outpatient follow-up with oncologist.  Anemia of chronic disease and chronic hyponatremia: Hemoglobin and sodium level are stable.  Lipitor had been discontinued because of myalgia.  Other comorbidities include CAD, hyperlipidemia, vitamin B12 deficiency, GERD.  He has been  refusing physical therapy.  He said he is not ready to go home because of ongoing right hip pain.    Diet Order             Diet regular Room service appropriate? Yes; Fluid consistency: Thin  Diet effective now                      Consultants: Oncologist  Procedures: None    Medications:    acetaminophen  1,000 mg Oral TID   aspirin EC  81 mg Oral Daily   enoxaparin (LOVENOX) injection  40 mg Subcutaneous Q24H   ezetimibe  10 mg Oral Daily   feeding supplement  237 mL Oral TID BM   fentaNYL  1 patch Transdermal Q72H   gabapentin  600 mg Oral TID   icosapent Ethyl  2 g Oral BID   isosorbide mononitrate  30 mg Oral Daily   ketorolac  15 mg Intravenous Once   methocarbamol  750 mg Oral QID   metoprolol tartrate  25 mg Oral BID   multivitamin with minerals  1 tablet Oral Daily   polyethylene glycol  17 g Oral Daily   senna-docusate  2 tablet Oral BID   sucralfate  0.5 g Oral TID AC   vitamin B-12  1,000 mcg Oral Daily   Continuous Infusions:   Anti-infectives (From admission, onward)    None              Family Communication/Anticipated D/C date and plan/Code Status   DVT prophylaxis: enoxaparin (LOVENOX) injection 40 mg Start: 01/22/21 0800     Code Status: Full  Code  Family Communication: His sister and nephew at the bedside Disposition Plan:    Status is: Inpatient  Remains inpatient appropriate because:Ongoing active pain requiring inpatient pain management  Dispo: The patient is from: Home              Anticipated d/c is to: Home              Patient currently is not medically stable to d/c.   Difficult to place patient No           Subjective:   He still complains of severe right hip pain rated as 9 out of 10.  He was frustrated about lack of improvement and he was asking for more pain medicine.  Objective:    Vitals:   01/31/21 1049 01/31/21 1112 01/31/21 1403 01/31/21 1506  BP: (!) 112/58 103/87 106/68 116/64   Pulse: 79 76 83 83  Resp:  16  18  Temp:  98.7 F (37.1 C)  97.8 F (36.6 C)  TempSrc:  Oral    SpO2:  98% 99% 100%  Weight:      Height:       No data found.   Intake/Output Summary (Last 24 hours) at 01/31/2021 1509 Last data filed at 01/31/2021 0704 Gross per 24 hour  Intake 240 ml  Output 1200 ml  Net -960 ml   Filed Weights   01/27/21 0500 01/29/21 0500 01/31/21 0500  Weight: 58.1 kg 57.1 kg 51.4 kg    Exam:  GEN: NAD SKIN: No rash EYES: EOMI ENT: MMM CV: RRR PULM: CTA B ABD: soft, ND, NT, +BS CNS: AAO x 3, non focal EXT: Right hip tenderness.  No swelling or redness noted.        Data Reviewed:   I have personally reviewed following labs and imaging studies:  Labs: Labs show the following:   Basic Metabolic Panel: Recent Labs  Lab 01/29/21 0537  NA 133*  K 4.5  CL 95*  CO2 30  GLUCOSE 145*  BUN 17  CREATININE 0.54*  CALCIUM 8.4*   GFR Estimated Creatinine Clearance: 61.6 mL/min (A) (by C-G formula based on SCr of 0.54 mg/dL (L)). Liver Function Tests: No results for input(s): AST, ALT, ALKPHOS, BILITOT, PROT, ALBUMIN in the last 168 hours.  No results for input(s): LIPASE, AMYLASE in the last 168 hours. No results for input(s): AMMONIA in the last 168 hours. Coagulation profile No results for input(s): INR, PROTIME in the last 168 hours.  CBC: Recent Labs  Lab 01/29/21 0537  WBC 7.8  HGB 8.2*  HCT 25.0*  MCV 91.9  PLT 270   Cardiac Enzymes: No results for input(s): CKTOTAL, CKMB, CKMBINDEX, TROPONINI in the last 168 hours.  BNP (last 3 results) No results for input(s): PROBNP in the last 8760 hours. CBG: No results for input(s): GLUCAP in the last 168 hours. D-Dimer: No results for input(s): DDIMER in the last 72 hours. Hgb A1c: No results for input(s): HGBA1C in the last 72 hours. Lipid Profile: No results for input(s): CHOL, HDL, LDLCALC, TRIG, CHOLHDL, LDLDIRECT in the last 72 hours.  Thyroid function  studies: No results for input(s): TSH, T4TOTAL, T3FREE, THYROIDAB in the last 72 hours.  Invalid input(s): FREET3 Anemia work up: No results for input(s): VITAMINB12, FOLATE, FERRITIN, TIBC, IRON, RETICCTPCT in the last 72 hours. Sepsis Labs: Recent Labs  Lab 01/29/21 0537  WBC 7.8    Microbiology Recent Results (from the past 240 hour(s))  SARS CORONAVIRUS 2 (  TAT 6-24 HRS) Nasopharyngeal Nasopharyngeal Swab     Status: None   Collection Time: 01/21/21  8:01 PM   Specimen: Nasopharyngeal Swab  Result Value Ref Range Status   SARS Coronavirus 2 NEGATIVE NEGATIVE Final    Comment: (NOTE) SARS-CoV-2 target nucleic acids are NOT DETECTED.  The SARS-CoV-2 RNA is generally detectable in upper and lower respiratory specimens during the acute phase of infection. Negative results do not preclude SARS-CoV-2 infection, do not rule out co-infections with other pathogens, and should not be used as the sole basis for treatment or other patient management decisions. Negative results must be combined with clinical observations, patient history, and epidemiological information. The expected result is Negative.  Fact Sheet for Patients: SugarRoll.be  Fact Sheet for Healthcare Providers: https://www.woods-mathews.com/  This test is not yet approved or cleared by the [place] FDA and  has been authorized for detection and/or diagnosis of SARS-CoV-2 by FDA under an Emergency Use Authorization (EUA). This EUA will remain  in effect (meaning this test can be used) for the duration of the COVID-19 declaration under Se ction 564(b)(1) of the Act, 21 U.S.C. section 360bbb-3(b)(1), unless the authorization is terminated or revoked sooner.  Performed at [facility], [place] [address], [place], [place] [address]     Procedures and diagnostic studies:  No results found.             LOS: 8 days   Marysol Wellnitz  [facility]    Pager on [email or web address]. If 7PM-7AM, please contact night-coverage at [email or web address]     01/31/2021, 3:09 PM

## 2021-01-31 NOTE — Progress Notes (Signed)
PT Cancellation Note  Patient Details Name: AUGUSTUS ZURAWSKI MRN: [id number] DOB: 06-03-1950   Cancelled Treatment:     Pt continued to refuse any activity, stating he was in 10/10 pain in his lower back while resting quietly in bed with wife at bedside. Therapist discussed importance of mobility, wife stated she could not take him home until he was walking.  Pt remains non-compliant with PT/OT attempts at this time.    Josie Dixon 01/31/2021, 1:49 PM

## 2021-02-01 ENCOUNTER — Inpatient Hospital Stay: Payer: Medicare HMO

## 2021-02-01 ENCOUNTER — Ambulatory Visit: Payer: Medicare HMO

## 2021-02-01 ENCOUNTER — Inpatient Hospital Stay: Payer: Medicare HMO | Admitting: Oncology

## 2021-02-01 DIAGNOSIS — Z515 Encounter for palliative care: Secondary | ICD-10-CM | POA: Diagnosis not present

## 2021-02-01 DIAGNOSIS — M5442 Lumbago with sciatica, left side: Secondary | ICD-10-CM | POA: Diagnosis not present

## 2021-02-01 DIAGNOSIS — M25551 Pain in right hip: Secondary | ICD-10-CM | POA: Diagnosis not present

## 2021-02-01 DIAGNOSIS — C3492 Malignant neoplasm of unspecified part of left bronchus or lung: Secondary | ICD-10-CM | POA: Diagnosis not present

## 2021-02-01 DIAGNOSIS — M5441 Lumbago with sciatica, right side: Secondary | ICD-10-CM | POA: Diagnosis not present

## 2021-02-01 LAB — GLUCOSE, CAPILLARY: Glucose-Capillary: 254 mg/dL — ABNORMAL HIGH (ref 70–99)

## 2021-02-01 NOTE — Progress Notes (Addendum)
Progress Note    Donald Lowery  HQI:[id number] DOB: [birth year]  DOA: 01/21/2021 PCP: [name], NP      Brief Narrative:    Medical records reviewed and are as summarized below:  Donald Lowery is a 70 y.o. 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  He presented to the hospital with acute right hip pain.  Right hip pain was attributed to metastatic disease from lung cancer.  He was treated with analgesics.  Palliative care team was consulted to assist with pain control.  He also received palliative radiation therapy.    Assessment/Plan:   Active Problems:   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HCC)   Intractable pain   Right hip pain   Palliative care encounter   Nutrition Problem: Severe Malnutrition (in the context of chronic illness) Etiology: cancer and cancer related treatments, poor appetite  Signs/Symptoms: severe muscle depletion, severe fat depletion    Severe right hip pain, recurrent mechanical fall, inability to ambulate, recent L3 kyphoplasty on 01/17/2021: Continue analgesics as needed for pain.  Case discussed with Dr. Jolee Ewing, NP with palliative care and radiation oncologist, Dr. Eulas Post, via secure chat.  MRI right hip has been reviewed.    (Overall showed progression of metastatic disease in the left and right iliac crest and S1 vertebral body.  He also showed muscle edema in the iliac [place] muscle bilaterally, right psoas muscle and bilateral abductor muscles.  It also showed small partial tear of the periphery of the right gluteus minimus musculotendinous junction).    Whole-body bone scan was recommended for further evaluation by oncology team.  Stage IV  non-small cell lung cancer with bony metastasis: Continue radiation therapy as prescribed by radiation oncologist. Outpatient follow-up with oncologist.  Anemia of chronic disease and chronic hyponatremia: Hemoglobin and sodium level are stable.  Lipitor had been discontinued because of myalgia.  Other comorbidities include CAD, hyperlipidemia, vitamin B12 deficiency, GERD.  He is still refusing physical therapy.    Diet Order             Diet regular Room service appropriate? Yes; Fluid consistency: Thin  Diet effective now                      Consultants: Oncologist  Procedures: None    Medications:    acetaminophen  1,000 mg Oral TID   aspirin EC  81 mg Oral Daily   dexamethasone  4 mg Oral Q12H   DULoxetine  30 mg Oral Daily   enoxaparin (LOVENOX) injection  40 mg Subcutaneous Q24H   ezetimibe  10 mg Oral Daily   feeding supplement  237 mL Oral TID BM   fentaNYL  1 patch Transdermal Q72H   gabapentin  600 mg Oral TID   icosapent Ethyl  2 g Oral BID   isosorbide mononitrate  30 mg Oral Daily   methocarbamol  750 mg Oral QID   metoprolol tartrate  25 mg Oral BID   multivitamin with minerals  1 tablet Oral Daily   polyethylene glycol  17 g Oral Daily   senna-docusate  2 tablet Oral BID   sucralfate  0.5 g Oral TID AC   vitamin B-12  1,000 mcg Oral  Daily   Continuous Infusions:   Anti-infectives (From admission, onward)    None              Family Communication/Anticipated D/C date and plan/Code Status   DVT prophylaxis: enoxaparin (LOVENOX) injection 40 mg Start: 01/22/21 0800     Code Status: DNR  Family Communication: Plan discussed with his wife at the bedside. Disposition Plan:    Status is: Inpatient  Remains inpatient appropriate because:Ongoing active pain requiring inpatient pain management  Dispo: The patient is from: Home              Anticipated d/c is to: Home              Patient currently is not medically stable  to d/c.   Difficult to place patient No           Subjective:   He complains of 9 out of 10 right hip pain although he says his pain is better than it was yesterday.  His wife was at the bedside.  Objective:    Vitals:   02/01/21 0402 02/01/21 0500 02/01/21 0800 02/01/21 0840  BP: 129/69  120/69 113/61  Pulse: 70  73 68  Resp: 20  14 15   Temp: 97.9 F (36.6 C)  97.7 F (36.5 C) 97.8 F (36.6 C)  TempSrc: Oral  Oral Oral  SpO2: 98%  93% 99%  Weight:  51.4 kg    Height:       No data found.  No intake or output data in the 24 hours ending 02/01/21 1306  Filed Weights   01/29/21 0500 01/31/21 0500 02/01/21 0500  Weight: 57.1 kg 51.4 kg 51.4 kg    Exam:  GEN: NAD SKIN: Warm and dry EYES: No pallor or icterus ENT: MMM CV: RRR PULM: CTA B ABD: soft, ND, NT, +BS CNS: AAO x 3, non focal EXT: Right hip tenderness.  No erythema or edema        Data Reviewed:   I have personally reviewed following labs and imaging studies:  Labs: Labs show the following:   Basic Metabolic Panel: Recent Labs  Lab 01/29/21 0537  NA 133*  K 4.5  CL 95*  CO2 30  GLUCOSE 145*  BUN 17  CREATININE 0.54*  CALCIUM 8.4*   GFR Estimated Creatinine Clearance: 61.6 mL/min (A) (by C-G formula based on SCr of 0.54 mg/dL (L)). Liver Function Tests: No results for input(s): AST, ALT, ALKPHOS, BILITOT, PROT, ALBUMIN in the last 168 hours.  No results for input(s): LIPASE, AMYLASE in the last 168 hours. No results for input(s): AMMONIA in the last 168 hours. Coagulation profile No results for input(s): INR, PROTIME in the last 168 hours.  CBC: Recent Labs  Lab 01/29/21 0537  WBC 7.8  HGB 8.2*  HCT 25.0*  MCV 91.9  PLT 270   Cardiac Enzymes: No results for input(s): CKTOTAL, CKMB, CKMBINDEX, TROPONINI in the last 168 hours.  BNP (last 3 results) No results for input(s): PROBNP in the last 8760 hours. CBG: Recent Labs  Lab 01/31/21 2017  GLUCAP 277*    D-Dimer: No results for input(s): DDIMER in the last 72 hours. Hgb A1c: No results for input(s): HGBA1C in the last 72 hours. Lipid Profile: No results for input(s): CHOL, HDL, LDLCALC, TRIG, CHOLHDL, LDLDIRECT in the last 72 hours.  Thyroid function studies: No results for input(s): TSH, T4TOTAL, T3FREE, THYROIDAB in the last 72 hours.  Invalid input(s): FREET3 Anemia work up: No results  for input(s): VITAMINB12, FOLATE, FERRITIN, TIBC, IRON, RETICCTPCT in the last 72 hours. Sepsis Labs: Recent Labs  Lab 01/29/21 0537  WBC 7.8    Microbiology No results found for this or any previous visit (from the past 240 hour(s)).   Procedures and diagnostic studies:  MR HIP RIGHT WO CONTRAST  Result Date: 02/01/2021 CLINICAL DATA:  Severe right hip pain.  History of lung cancer. EXAM: MR OF THE RIGHT HIP WITHOUT CONTRAST TECHNIQUE: Multiplanar, multisequence MR imaging was performed. No intravenous contrast was administered. COMPARISON:  01/13/2021 FINDINGS: Bones: No hip fracture, dislocation or avascular necrosis. Interval enlargement of a 4.4 x 4.5 cm soft tissue mass with bone destruction involving the left iliac crest with surrounding bone marrow edema. Mild edema in the adjacent iliacus muscle which may related to radiation changes. 20 mm osseous metastatic disease in the right iliac crest without a soft tissue component. 8 mm osseous metastatic disease in the S1 vertebral body. Normal sacrum and sacroiliac joints. No SI joint widening or erosive changes. Mild lower lumbar spine spondylosis. Articular cartilage and labrum Articular cartilage:  No chondral defect. Labrum: Grossly intact, but evaluation is limited by lack of intraarticular fluid. Joint or bursal effusion Joint effusion:  No hip joint effusion.  No SI joint effusion. Bursae:  No bursa formation. Muscles and tendons Flexors: Muscle edema in the iliacus muscle bilaterally and right psoas muscle which may reflect myositis versus  muscle strain. Extensors: Normal. Abductors: Normal. Adductors: Muscle edema in the adductor musculature bilaterally concerning for mild muscle strain versus myositis. Gluteals: Small partial tear of the periphery of the right gluteus minimus musculotendinous junction. Hamstrings: Normal. Other findings No pelvic free fluid. No fluid collection or hematoma. No inguinal lymphadenopathy. No inguinal hernia. IMPRESSION: 1. Interval enlargement of the left iliac crest soft tissue mass currently measuring 4.4 x 4.5 cm. Interval enlargement of the right iliac crest osseous metastatic disease measuring 20 mm. Interval enlargement of the S1 vertebral body focus of osseous metastatic disease. Overall appearance is consistent with progression of metastatic disease. 2. No hip fracture, dislocation or avascular necrosis. 3. Muscle edema in the iliacus muscle bilaterally and right psoas muscle as well as bilateral adductor musculature. Differential considerations include mild muscle strain versus myositis secondary to radiation therapy. 4. Small partial tear of the periphery of the right gluteus minimus musculotendinous junction. Electronically Signed   By: Kathreen Devoid M.D.   On: 02/01/2021 07:15               LOS: 9 days   Lashon Hillier  [facility]   Pager on [email or web address]. If 7PM-7AM, please contact night-coverage at [email or web address]     02/01/2021, 1:06 PM

## 2021-02-01 NOTE — Progress Notes (Signed)
PT Cancellation Note  Patient Details Name: CLIFF DAMIANI MRN: [id number] DOB: 1951-03-02   Cancelled Treatment:     Pt refused any activity. Will discuss and decide on possible d/c from skilled PT due to progression of terminal illness and reoccurring refusals to participate in any activity.    Josie Dixon 02/01/2021, 10:52 AM

## 2021-02-01 NOTE — Progress Notes (Signed)
OT Cancellation Note  Patient Details Name: Donald Lowery MRN: [id number] DOB: [birth year]   Cancelled Treatment:    Reason Eval/Treat Not Completed: Patient declined, no reason specified OT spends extended time speaking with pt and spouse attempting to encourage mobility/participation on some level to preserve strength/ROM prevent atrophy, maintain highest attainable QOL. Pt and spouse verbalize understanding, but pt still refuses to participate with therapy at this time reporting that he has radiation today. Pt states "come back tomorrow" although he seemingly also has radiation tx tomorrow. Will f/u as able/pt agreeable.   Gerrianne Scale, MS, OTR/L ascom [phone] 02/01/21, 1:20 PM

## 2021-02-01 NOTE — Progress Notes (Signed)
Nutrition Follow-up  DOCUMENTATION CODES:  Severe malnutrition in context of chronic illness  INTERVENTION:  Continue current diet as ordered, encourage PO intake Modify meats to chopped with gravy Continue Ensure TID, each supplement provides 350 kcal and 20 grams of protein Magic cup TID with meals, each supplement provides 290 kcal and 9 grams of protein. MVI with minerals daily.  NUTRITION DIAGNOSIS:  Severe Malnutrition (in the context of chronic illness) related to cancer and cancer related treatments, poor appetite as evidenced by severe muscle depletion, severe fat depletion.  GOAL:  Patient will meet greater than or equal to 90% of their needs  MONITOR:  PO intake, Supplement acceptance, Weight trends  REASON FOR ASSESSMENT:  Consult Assessment of nutrition requirement/status  ASSESSMENT:  70 yo male with a PMH of stage IV non-small cell carcinoma of left upper lung (10/24/20) with bone metastasis, lytic lesion in his left iliac crest s/p chemotherapy and radiation, weight loss, chronic hyponatremia, T2DM, HTN, CAD, and dyslipidemia who is admitted with R hip pain.  Pt resting in bed at the time of visit. Breakfast tray at bedside noted to be well consumed. Pt reports that his pain has been better controlled and it has been easier for him to eat. Opened ensure noted at bedside, reports that he has been drinking them, prefers chocolate flavor and does not like the strawberry very much. Denies any GI complaints today.  Pt reports that he has been having some problems with meats, wife reports this is an issue at home as well. Having a hard time chewing. Will request dining services chop meats and add gravy on the side to allow for easier chewing and swallowing. [place] ice given to patient per his request during assessment.   Significant fat and muscle deficits still present on exam.   Average Meal Intake: 9/27-10/3: 53% intake x 11 recorded meals 10/4 - 85% x 1 recorded  meal  Nutritionally Relevant Medications: Scheduled Meds:  dexamethasone  4 mg Oral Q12H   ezetimibe  10 mg Oral Daily   feeding supplement  237 mL Oral TID BM   multivitamin with minerals  1 tablet Oral Daily   polyethylene glycol  17 g Oral Daily   senna-docusate  2 tablet Oral BID   sucralfate  0.5 g Oral TID AC   vitamin B-12  1,000 mcg Oral Daily   PRN Meds: bisacodyl, magnesium hydroxide, ondansetron  Labs Reviewed  NUTRITION - FOCUSED PHYSICAL EXAM:  Flowsheet Row Most Recent Value  Orbital Region Moderate depletion  Upper Arm Region Moderate depletion  Thoracic and Lumbar Region Severe depletion  Buccal Region Moderate depletion  Temple Region Moderate depletion  Clavicle Bone Region Severe depletion  Clavicle and Acromion Bone Region Severe depletion  Scapular Bone Region Severe depletion  Dorsal Hand Moderate depletion  Patellar Region Severe depletion  Anterior Thigh Region Severe depletion  Posterior Calf Region Severe depletion  Edema (RD Assessment) None  Hair Reviewed  Eyes Reviewed  Mouth Reviewed  Skin Reviewed  Nails Reviewed   Diet Order:   Diet Order             Diet regular Room service appropriate? Yes; Fluid consistency: Thin  Diet effective now                  EDUCATION NEEDS:  No education needs have been identified at this time  Skin:  Skin Assessment: Skin Integrity Issues: Skin Integrity Issues:: Stage II, Incisions Stage II: Pressure Injury - sacrum Incisions:  Back, closed  Last BM:  10/3  Height:  Ht Readings from Last 1 Encounters:  01/26/21 5' 4.02" (1.626 m)   Weight:  Wt Readings from Last 1 Encounters:  02/01/21 51.4 kg   BMI:  Body mass index is 19.44 kg/m.  Estimated Nutritional Needs:  Kcal:  1700-1900 Protein:  85-95 g/d Fluid:  1.8-2L/d  Ranell Patrick, RD, LDN Clinical Dietitian Pager on [place]

## 2021-02-01 NOTE — Progress Notes (Signed)
[facility]  Telephone:([phone] [phone]   Name: Donald Lowery Date: 02/01/2021 MRN: [id number]  DOB: 06/25/1950  Patient Care Team: [name], NP as PCP - General (Internal Medicine) [name], MD as PCP - Cardiology (Cardiology) Telford Nab, RN as Oncology Nurse Navigator    REASON FOR CONSULTATION: Donald Lowery is a 69 y.o. 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  Patient was recently hospitalized 01/12/2021 to 01/19/2021 with intractable back pain.  He was found to have a new compression fracture of L3 and underwent kyphoplasty.  Patient was readmitted 01/21/2021 with right hip pain.  He was started on XRT to the lumbar spine with pain felt to be referred from his known spinal metastatic disease.  Palliative care was consulted to help with pain management.Donald Lowery    CODE STATUS: DNR  PAST MEDICAL HISTORY: Past Medical History:  Diagnosis Date   Aortic atherosclerosis ([place])    Bochdalek hernia 09/21/2020   fatty   Coronary artery disease    a. 04/2018 NSTEMI/Cath: LM nl, LAD 50p, 22m/d diffuse-small caliber, LCX heavily Ca2+ sev prox/mid dzs, RCA 90 diff distal dzs into RPL and RPDA. EF 50-55%-->Med Rx.   Hepatic steatosis    History of 2019 novel coronavirus disease (COVID-19) 10/12/2020   History of echocardiogram    a. 04/2018 Echo: EF 55-60%, no rwma, mild MR, mildly dil LA. Nl RV fxn.   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 Quit 04/2018.    PAST SURGICAL HISTORY:  Past Surgical History:   Procedure Laterality Date   BRAIN SURGERY     CARDIAC CATHETERIZATION     IR IMAGING GUIDED PORT INSERTION  10/28/2020   KYPHOPLASTY N/A 01/17/2021   Procedure: Claybon Jabs RFA;  Surgeon: Hessie Knows, MD;  Location: [facility] OR[facility];  Service: Orthopedics;  Laterality: N/A;   LEFT HEART CATH AND CORONARY ANGIOGRAPHY N/A 05/16/2018   Procedure: LEFT HEART CATH AND CORONARY ANGIOGRAPHY poss PCI;  Surgeon: [name], MD;  Location: [facility];  Service: Cardiovascular;  Laterality: N/A;   VIDEO BRONCHOSCOPY WITH ENDOBRONCHIAL NAVIGATION N/A 10/24/2020   Procedure: ROBOTIC ASSISTED VIDEO BRONCHOSCOPY WITH ENDOBRONCHIAL NAVIGATION;  Surgeon: Tyler Pita, MD;  Location: [facility];  Service: Pulmonary;  Laterality: N/A;    HEMATOLOGY/ONCOLOGY HISTORY:  Oncology History  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[place])  09/21/2020 Initial Diagnosis   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[place])   10/28/2020 Cancer Staging   Staging form: Lung, AJCC 8th Edition - Clinical stage from 10/28/2020: Stage IVA (cT4, cN0, pM1a) - Signed by Lloyd Huger, MD on 10/28/2020   11/03/2020 - 01/11/2021 Chemotherapy         02/01/2021 -  Chemotherapy    Patient is on Treatment Plan: LUNG NSCLC FLAT DOSE PEMBROLIZUMAB Q21D        ALLERGIES:  has No Known Allergies.  MEDICATIONS:  Current Facility-Administered Medications  Medication Dose Route Frequency Provider Last Rate Last Admin   acetaminophen (TYLENOL) tablet 650 mg  650 mg Oral Q6H PRN  Mansy, Jan A, MD       Or   acetaminophen (TYLENOL) suppository 650 mg  650 mg Rectal Q6H PRN Mansy, Jan A, MD       acetaminophen (TYLENOL) tablet 1,000 mg  1,000 mg Oral TID [name], MD   1,000 mg at 01/31/21 2118   ALPRAZolam Duanne Moron) tablet 0.25 mg  0.25 mg Oral BID PRN Lloyd Huger, MD   0.25 mg at 01/31/21 2243   aspirin EC tablet 81 mg  81 mg Oral Daily Mansy, Jan A, MD   81 mg at 01/31/21 0919   bisacodyl (DULCOLAX) suppository 10  mg  10 mg Rectal Daily PRN [name], MD   10 mg at 01/24/21 1441   dexamethasone (DECADRON) tablet 4 mg  4 mg Oral Q12H Jaimon Bugaj, [name], NP   4 mg at 01/31/21 2145   dicyclomine (BENTYL) capsule 10 mg  10 mg Oral TID PRN Mansy, Jan A, MD       DULoxetine (CYMBALTA) DR capsule 30 mg  30 mg Oral Daily [name], [name], NP   30 mg at 01/31/21 1741   enoxaparin (LOVENOX) injection 40 mg  40 mg Subcutaneous Q24H Mansy, Jan A, MD   40 mg at 01/31/21 0918   ezetimibe (ZETIA) tablet 10 mg  10 mg Oral Daily Mansy, Jan A, MD   10 mg at 01/31/21 0867   feeding supplement (ENSURE ENLIVE / ENSURE PLUS) liquid 237 mL  237 mL Oral TID BM Mansy, Jan A, MD   237 mL at 01/31/21 2126   fentaNYL (DURAGESIC) 100 MCG/HR 1 patch  1 patch Transdermal Q72H Krisanne Lich, [name], NP   1 patch at 01/30/21 1507   gabapentin (NEURONTIN) tablet 600 mg  600 mg Oral TID [name], MD   600 mg at 01/31/21 2117   HYDROmorphone (DILAUDID) injection 2 mg  2 mg Intravenous Y1P PRN Pershing Proud, NP   2 mg at 02/01/21 0754   HYDROmorphone (DILAUDID) tablet 4-6 mg  4-6 mg Oral Q4H PRN Aruna Nestler, [name], NP   4 mg at 01/31/21 1822   icosapent Ethyl (VASCEPA) 1 g capsule 2 g  2 g Oral BID Mansy, Jan A, MD   2 g at 01/31/21 2124   isosorbide mononitrate (IMDUR) 24 hr tablet 30 mg  30 mg Oral Daily Mansy, Jan A, MD   30 mg at 01/31/21 5093   magnesium hydroxide (MILK OF MAGNESIA) suspension 30 mL  30 mL Oral Daily PRN Mansy, Jan A, MD       methocarbamol (ROBAXIN) tablet 750 mg  750 mg Oral QID Priscella Mann, Sudheer B, MD   750 mg at 01/31/21 2117   metoprolol tartrate (LOPRESSOR) tablet 25 mg  25 mg Oral BID Mansy, Jan A, MD   25 mg at 01/31/21 2118   multivitamin with minerals tablet 1 tablet  1 tablet Oral Daily [name], MD   1 tablet at 01/31/21 0917   Muscle Rub CREA 1 application  1 application Topical Daily PRN Mansy, Jan A, MD       naloxone [facility]) injection 0.4 mg  0.4 mg Intravenous PRN Mansy, Jan  A, MD       ondansetron [facility]) tablet 4 mg  4 mg Oral Q6H PRN Mansy, Jan A, MD       Or   ondansetron [facility]) injection 4 mg  4 mg Intravenous Q6H PRN Mansy, Arvella Merles, MD   4 mg at  02/01/21 0755   polyethylene glycol (MIRALAX / GLYCOLAX) packet 17 g  17 g Oral Daily [name], MD   17 g at 01/31/21 1191   senna-docusate (Senokot-S) tablet 2 tablet  2 tablet Oral BID Jennye Boroughs, MD   2 tablet at 01/31/21 2118   sucralfate (CARAFATE) tablet 0.5 g  0.5 g Oral TID [facility] Mansy, Jan A, MD   0.5 g at 01/31/21 1741   traZODone (DESYREL) tablet 25 mg  25 mg Oral QHS PRN Mansy, Jan A, MD   25 mg at 01/31/21 2118   vitamin B-12 (CYANOCOBALAMIN) tablet 1,000 mcg  1,000 mcg Oral Daily Mansy, Jan A, MD   1,000 mcg at 01/31/21 0916    VITAL SIGNS: BP 113/61 (BP Location: Right Arm)   Pulse 68   Temp 97.8 F (36.6 C) (Oral)   Resp 15   Ht 5' 4.02" (1.626 m)   Wt 113 lb 5.1 oz (51.4 kg)   SpO2 99%   BMI 19.44 kg/m  Filed Weights   01/29/21 0500 01/31/21 0500 02/01/21 0500  Weight: 125 lb 14.1 oz (57.1 kg) 113 lb 5.1 oz (51.4 kg) 113 lb 5.1 oz (51.4 kg)    Estimated body mass index is 19.44 kg/m as calculated from the following:   Height as of this encounter: 5' 4.02" (1.626 m).   Weight as of this encounter: 113 lb 5.1 oz (51.4 kg).  LABS: CBC:    Component Value Date/Time   WBC 7.8 01/29/2021 0537   HGB 8.2 (L) 01/29/2021 0537   HCT 25.0 (L) 01/29/2021 0537   PLT 270 01/29/2021 0537   MCV 91.9 01/29/2021 0537   NEUTROABS 5.7 01/21/2021 2001   LYMPHSABS 0.4 (L) 01/21/2021 2001   MONOABS 0.4 01/21/2021 2001   EOSABS 0.0 01/21/2021 2001   BASOSABS 0.0 01/21/2021 2001   Comprehensive Metabolic Panel:    Component Value Date/Time   NA 133 (L) 01/29/2021 0537   NA 138 12/16/2018 0933   K 4.5 01/29/2021 0537   CL 95 (L) 01/29/2021 0537   CO2 30 01/29/2021 0537   BUN 17 01/29/2021 0537   BUN 14 12/16/2018 0933   CREATININE 0.54 (L) 01/29/2021 0537   GLUCOSE 145 (H)  01/29/2021 0537   CALCIUM 8.4 (L) 01/29/2021 0537   AST 23 01/21/2021 2001   ALT 18 01/21/2021 2001   ALKPHOS 116 01/21/2021 2001   BILITOT 1.0 01/21/2021 2001   PROT 6.2 (L) 01/21/2021 2001   ALBUMIN 2.5 (L) 01/21/2021 2001    RADIOGRAPHIC STUDIES: DG Lumbar Spine 2-3 Views  Result Date: 01/17/2021 CLINICAL DATA:  Kyphoplasty. EXAM: DG C-ARM 1-60 MIN; LUMBAR SPINE - 2-3 VIEW FLUOROSCOPY TIME:  Fluoroscopy Time:  2 minutes and 2 seconds. Number of Acquired Spot Images: 7 COMPARISON:  MRI lumbar spine 01/13/2021. FINDINGS: Seven C-arm fluoroscopic images were obtained intraoperatively and submitted for post operative interpretation. These images demonstrate kyphoplasty of the L3 vertebral body with slight lateral extravasation. Please see the performing provider's procedural report for further detail. IMPRESSION: Intraoperative fluoroscopy, as detailed above. Electronically Signed   By: Margaretha Sheffield M.D.   On: 01/17/2021 17:01   DG Lumbar Spine 2-3 Views  Result Date: 01/12/2021 CLINICAL DATA:  Back pain, lung cancer, evaluate for metastases EXAM: LUMBAR SPINE - 2-3 VIEW COMPARISON:  01/03/2021 FINDINGS: Mild superior endplate compression fracture deformity at L3, with 15% loss of height, new from the prior. This reflects a pathologic fracture. Mild degenerative changes of the visualized thoracolumbar spine.  Visualized bony pelvis appears intact. IMPRESSION: Mild superior endplate compression fracture deformity at L3, with 15% loss of height, new from the prior. This reflects a pathologic fracture. Electronically Signed   By: Julian Hy M.D.   On: 01/12/2021 20:04   DG Lumbar Spine Complete  Result Date: 01/04/2021 CLINICAL DATA:  Back pain EXAM: LUMBAR SPINE - COMPLETE 4+ VIEW COMPARISON:  None. FINDINGS: Vertebral body heights are well-maintained. Levocurvature of the lumbar spine with rotatory component. No evidence of fracture. Minimal retrolisthesis of L1 on L2 and L3 and L4.  Multilevel degenerative disc disease with mild-to-moderate osteophyte formation and moderate disc space height loss at L3-L4. Mild multilevel facet arthropathy. Aortic vascular calcifications. IMPRESSION: No acute osseous abnormality. Mild-to-moderate multilevel degenerative disc disease and mild facet arthropathy. Electronically Signed   By: Yetta Glassman M.D.   On: 01/04/2021 10:24   CT Lumbar Spine Wo Contrast  Result Date: 01/21/2021 CLINICAL DATA:  Initial evaluation for worsening back pain, right hip pain, recent surgery. EXAM: CT LUMBAR SPINE WITHOUT CONTRAST TECHNIQUE: Multidetector CT imaging of the lumbar spine was performed without intravenous contrast administration. Multiplanar CT image reconstructions were also generated. COMPARISON:  MRI from 01/13/2021. FINDINGS: Segmentation: Standard. Lowest well-formed disc space labeled the L5-S1 level. Alignment: Mild sigmoid scoliotic curvature of the thoracolumbar spine. 3 mm retrolisthesis of L1 on L2. Vertebrae: There has been interval performance of vertebral augmentation for previously identified pathologic fracture at L3. No visible complicating features. Associated height loss is stable without significant interval collapse. Posterior convex bowing of the L3 vertebral body with resultant moderate spinal stenosis is grossly stable. Extraosseous extension of tumor into the adjacent right psoas musculature also grossly similar. Vertebral body height otherwise maintained with no other acute or interval fracture. Visualized sacrum intact. SI joints symmetric and normal. There is an additional lytic metastatic lesion involving the left iliac wing (series 7, image 45). No other visible osseous metastatic disease. Paraspinal and other soft tissues: Extraosseous extension of tumor into the right psoas musculature related to the L3 metastasis, grossly stable from prior MRI. Paraspinous soft tissues demonstrate no other acute finding. Advanced aorto bi-iliac  atherosclerotic disease. Disc levels: L1-2: Trace retrolisthesis with mild disc bulge. No spinal stenosis. Foramina remain patent. L2-3: Mild disc bulge with facet hypertrophy. No spinal stenosis. Foramina remain patent. L3-4: Degenerative intervertebral disc space narrowing with diffuse disc bulge. Mild facet hypertrophy. Moderate to advanced spinal stenosis related to posterior convex bowing related to the pathologic L3 compression fracture noted, grossly similar. Moderate to severe right with mild left L3 foraminal stenosis, grossly stable. L4-5: Mild disc bulge with facet hypertrophy. No spinal stenosis. No more than mild bilateral L4 foraminal narrowing. L5-S1: Mild disc bulge. Right greater than left facet arthrosis. No spinal stenosis. Foramina remain patent. IMPRESSION: 1. Interval performance of vertebral augmentation for previously identified pathologic fracture at L3. No visible complication. Associated height loss is stable without further interval collapse. Posterior convex bowing of the L3 vertebral body with resultant moderate to advanced spinal stenosis, similar to previous. Moderate to severe right L3 foraminal stenosis also grossly stable. 2. Additional lytic metastatic lesion involving the left iliac wing. 3. No other acute abnormality within the lumbar spine. 4. Aortic Atherosclerosis (ICD10-I70.0). Electronically Signed   By: Jeannine Boga M.D.   On: 01/21/2021 20:40   MR Lumbar Spine W Wo Contrast  Result Date: 01/13/2021 CLINICAL DATA:  Spine fracture, lumbosacral, pathological new pathologic compression fracture lumbar spine EXAM: MRI LUMBAR SPINE WITHOUT AND WITH CONTRAST  TECHNIQUE: Multiplanar and multiecho pulse sequences of the lumbar spine were obtained without and with intravenous contrast. CONTRAST:  8mL GADAVIST GADOBUTROL 1 MMOL/ML IV SOLN COMPARISON:  PET-CT 12/12/2020.  Lumbar radiographs 01/12/2021. FINDINGS: Segmentation: Standard segmentation is assumed. The  inferior-most fully formed intervertebral disc is labeled L5-S1. Alignment:  Substantial sagittal subluxation.  Levocurvature. Vertebrae: Abnormal enhancement, T1 hypointensity and STIR hyperintensity involving the entire L3 vertebral body and bilateral L3 pedicles, compatible with metastasis. Pathologic fracture with 50% vertebral body height loss. Extraosseous extension of tumor with right anterior paraspinal soft tissue mass measuring 4.2 cm with surrounding edema/enhancement. There also is posterior epidural extension of enhancing tumor into the canal and right foramen with associated severe canal stenosis and moderate to severe right foraminal stenosis. Conus medullaris and cauda equina: Conus extends to the L1 level. Conus appears normal Paraspinal and other soft tissues: Right anterolateral paraspinal soft tissue mass at L3 with extensive surrounding edema and enhancement, as detailed above. Disc levels: T12-L1: No significant disc protrusion, foraminal stenosis, or canal stenosis. L1-L2: Mild disc bulging without significant canal or foraminal stenosis. L2-L3: Mild disc bulging without significant canal or foraminal stenosis. L3-L4: Pathologic fracture at L3 with posterior epidural extension of tumor into the canal and right foramen, as described above. Resulting severe canal stenosis and moderate to severe right foraminal stenosis. L4-L5: Small broad disc bulge and bilateral facet hypertrophy with mild right foraminal stenosis. No significant canal or left foraminal stenosis. L5-S1: Facet hypertrophy without significant canal or foraminal stenosis. Other: Please see MRI of the pelvis for evaluation of the pelvis/sacrum. IMPRESSION: 1. Osseous metastatic disease at L3 with pathologic fracture (50% height loss) and posterior epidural extension of tumor into the canal and right foramen. Resulting severe canal stenosis and moderate to severe right foraminal stenosis. Associated right anterolateral 4.2 cm  paraspinal soft tissue mass at L3 with surrounding inflammation/edema. 2. No evidence of osseous metastatic disease in the other lumbar vertebral bodies. Please see concurrent MRI of the pelvis for evaluation of the pelvis/sacrum. Electronically Signed   By: Margaretha Sheffield M.D.   On: 01/13/2021 12:13   MR PELVIS W WO CONTRAST  Result Date: 01/13/2021 CLINICAL DATA:  70 y.o. 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 CONTRAST:  42mL GADAVIST GADOBUTROL 1 MMOL/ML IV SOLN COMPARISON:  PET-CT 12/12/2020 FINDINGS: Bones: No hip fracture, dislocation or avascular necrosis. 3.3 x 3.8 cm soft tissue mass with bone destruction involving the left iliac crest with surrounding bone marrow edema. Mild edema in the adjacent iliacus muscle which may related to radiation changes. 12 mm focus of osseous metastatic disease in the right iliac crest without a soft tissue component. 5 mm osseous metastatic disease in the S1 vertebral body. Normal sacrum and sacroiliac joints. No SI joint widening or erosive changes. Articular cartilage and labrum Articular cartilage:  No chondral defect. Labrum: Grossly intact, but evaluation is limited by lack of intraarticular fluid. Joint or bursal effusion Joint effusion:  No hip joint effusion.  No SI joint effusion. Bursae:  No bursa formation. Muscles and tendons Flexors: Mild perifascial edema involving the iliacus muscles bilaterally. Extensors: Normal. Abductors: Normal. Adductors: Normal. Gluteals: Normal. Hamstrings: Normal. Other findings No pelvic free fluid. No fluid  collection or hematoma. No inguinal lymphadenopathy. No inguinal hernia.  IMPRESSION: 1. Osseous metastatic disease involving the left iliac crest with a large soft tissue component measuring 3.3 x 3.8 cm and surrounding bone marrow edema. Mild edema in the adjacent iliacus muscle which may related to radiation changes. 12 mm focus of osseous metastatic disease in the right iliac crest. 5 mm osseous metastatic disease in the S1 vertebral body. 2. No hip fracture, dislocation or avascular necrosis. Electronically Signed   By: Kathreen Devoid M.D.   On: 01/13/2021 13:18   CT Hip Right Wo Contrast  Result Date: 01/21/2021 CLINICAL DATA:  Fall, right hip pain EXAM: CT OF THE RIGHT HIP WITHOUT CONTRAST TECHNIQUE: Multidetector CT imaging of the right hip was performed according to the standard protocol. Multiplanar CT image reconstructions were also generated. COMPARISON:  X-ray 01/21/2021 FINDINGS: Bones/Joint/Cartilage Right hip is intact without fracture or dislocation. No evidence of avascular necrosis of the femoral head. Right hip joint space is maintained. No hip joint effusion is evident. Visualized portion of the right hemipelvis is intact. Pubic symphysis and right sacroiliac joint intact without diastasis. No lytic or sclerotic bony lesions are identified. Site of known right iliac crest metastatic lesion is not included within the field of view on the current study. Ligaments Suboptimally assessed by CT. Muscles and Tendons No acute musculotendinous abnormality by CT. Soft tissues Mild subcutaneous edema overlying the greater trochanter. No organized fluid collection or hematoma. No abnormally enlarged right inguinal lymph nodes. Bilateral hydroceles. Atherosclerotic vascular calcifications are present. IMPRESSION: 1. No acute fracture or dislocation of the right hip. 2. Bilateral hydroceles. Electronically Signed   By: Davina Poke D.O.   On: 01/21/2021 20:22   MR HIP RIGHT WO CONTRAST  Result Date: 02/01/2021 CLINICAL DATA:  Severe right hip pain.  History of lung  cancer. EXAM: MR OF THE RIGHT HIP WITHOUT CONTRAST TECHNIQUE: Multiplanar, multisequence MR imaging was performed. No intravenous contrast was administered. COMPARISON:  01/13/2021 FINDINGS: Bones: No hip fracture, dislocation or avascular necrosis. Interval enlargement of a 4.4 x 4.5 cm soft tissue mass with bone destruction involving the left iliac crest with surrounding bone marrow edema. Mild edema in the adjacent iliacus muscle which may related to radiation changes. 20 mm osseous metastatic disease in the right iliac crest without a soft tissue component. 8 mm osseous metastatic disease in the S1 vertebral body. Normal sacrum and sacroiliac joints. No SI joint widening or erosive changes. Mild lower lumbar spine spondylosis. Articular cartilage and labrum Articular cartilage:  No chondral defect. Labrum: Grossly intact, but evaluation is limited by lack of intraarticular fluid. Joint or bursal effusion Joint effusion:  No hip joint effusion.  No SI joint effusion. Bursae:  No bursa formation. Muscles and tendons Flexors: Muscle edema in the iliacus muscle bilaterally and right psoas muscle which may reflect myositis versus muscle strain. Extensors: Normal. Abductors: Normal. Adductors: Muscle edema in the adductor musculature bilaterally concerning for mild muscle strain versus myositis. Gluteals: Small partial tear of the periphery of the right gluteus minimus musculotendinous junction. Hamstrings: Normal. Other findings No pelvic free fluid. No fluid collection or hematoma. No inguinal lymphadenopathy. No inguinal hernia. IMPRESSION: 1. Interval enlargement of the left iliac crest soft tissue mass currently measuring 4.4 x 4.5 cm. Interval enlargement of the right iliac crest osseous metastatic disease measuring 20 mm. Interval enlargement of the S1 vertebral body focus of osseous metastatic disease. Overall appearance is consistent with progression of metastatic disease. 2. No hip fracture,  dislocation or  avascular necrosis. 3. Muscle edema in the iliacus muscle bilaterally and right psoas muscle as well as bilateral adductor musculature. Differential considerations include mild muscle strain versus myositis secondary to radiation therapy. 4. Small partial tear of the periphery of the right gluteus minimus musculotendinous junction. Electronically Signed   By: Kathreen Devoid M.D.   On: 02/01/2021 07:15   DG C-Arm 1-60 Min  Result Date: 01/17/2021 CLINICAL DATA:  Kyphoplasty. EXAM: DG C-ARM 1-60 MIN; LUMBAR SPINE - 2-3 VIEW FLUOROSCOPY TIME:  Fluoroscopy Time:  2 minutes and 2 seconds. Number of Acquired Spot Images: 7 COMPARISON:  MRI lumbar spine 01/13/2021. FINDINGS: Seven C-arm fluoroscopic images were obtained intraoperatively and submitted for post operative interpretation. These images demonstrate kyphoplasty of the L3 vertebral body with slight lateral extravasation. Please see the performing provider's procedural report for further detail. IMPRESSION: Intraoperative fluoroscopy, as detailed above. Electronically Signed   By: Margaretha Sheffield M.D.   On: 01/17/2021 17:01   DG Hip Unilat  With Pelvis 2-3 Views Right  Result Date: 01/21/2021 CLINICAL DATA:  Right hip pain. History of metastatic non-small cell lung cancer EXAM: DG HIP (WITH OR WITHOUT PELVIS) 2-3V RIGHT COMPARISON:  MRI pelvis 01/13/2021 FINDINGS: No acute fracture or dislocation. Right hip joint space is preserved. Known destructive lesion at the left iliac crest is not well seen, partially obscured by overlying stool and bowel gas. Small marrow replacing lesion of the right iliac crest seen on recent MRI is not well demonstrated radiographically. Otherwise, no evidence of a new destructive bone lesion. IMPRESSION: 1. No acute fracture or dislocation. 2. Known metastatic lesions within the bilateral iliac crests are not well seen radiographically. Electronically Signed   By: Davina Poke D.O.   On: 01/21/2021 18:25    PERFORMANCE  STATUS (ECOG) : 3 - Symptomatic, >50% confined to bed  Review of Systems Unless otherwise noted, a complete review of systems is negative.  Physical Exam General: NAD Cardiovascular: regular rate and rhythm Pulmonary: clear ant fields Abdomen: soft, nontender, + bowel sounds GU: no suprapubic tenderness Extremities: no edema, no joint deformities Skin: no rashes Neurological: Weakness but otherwise nonfocal  IMPRESSION: Follow-up visit.    Patient reports pain is significantly improved this morning (although he continues to rated as 9 out of 10).  He is currently eating breakfast and is comfortable appearing.  Would like to see patient to be able to work with PT/OT today in anticipation of discharge.  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  This was concerning for progression of metastatic disease.  Discussed with Dr. Grayland Ormond and could consider bone scan for better characterization.  However, patient is also receiving XRT to the left iliac and lumbar spine and so inflammatory changes could also be contributing to his pain.  Yesterday, patient was restarted on liberalize dosing of steroids.  We will plan to continue this for now.  PLAN: -Continue current scope of treatment -Continue fentanyl/hydromorphone/dexamethasone -Continue duloxetine 30 mg daily -PT/OT  Case and plan discussed with Dr. Grayland Ormond   Time Total: 15 minutes  Visit consisted of counseling and education dealing with the complex and emotionally intense issues of symptom management and palliative care in the setting of serious and potentially life-threatening illness.Greater than 50%  of this time was spent counseling and coordinating care related to the above assessment and plan.  Signed by: Altha Harm, PhD, NP-C

## 2021-02-02 ENCOUNTER — Ambulatory Visit: Payer: Medicare HMO

## 2021-02-02 ENCOUNTER — Inpatient Hospital Stay
Admit: 2021-02-02 | Discharge: 2021-02-02 | Disposition: A | Discharge status: Home / Self Care | Payer: Medicare HMO | Attending: Internal Medicine | Admitting: Internal Medicine

## 2021-02-02 DIAGNOSIS — Z515 Encounter for palliative care: Secondary | ICD-10-CM | POA: Diagnosis not present

## 2021-02-02 DIAGNOSIS — C3492 Malignant neoplasm of unspecified part of left bronchus or lung: Secondary | ICD-10-CM | POA: Diagnosis not present

## 2021-02-02 DIAGNOSIS — R296 Repeated falls: Secondary | ICD-10-CM | POA: Diagnosis not present

## 2021-02-02 DIAGNOSIS — R52 Pain, unspecified: Secondary | ICD-10-CM | POA: Diagnosis not present

## 2021-02-02 DIAGNOSIS — M25551 Pain in right hip: Secondary | ICD-10-CM | POA: Diagnosis not present

## 2021-02-02 LAB — GLUCOSE, CAPILLARY
Glucose-Capillary: 245 mg/dL — ABNORMAL HIGH (ref 70–99)
Glucose-Capillary: 311 mg/dL — ABNORMAL HIGH (ref 70–99)

## 2021-02-02 MED ORDER — TECHNETIUM TC 99M MEDRONATE IV KIT
21.7800 | PACK | Freq: Once | INTRAVENOUS | Status: AC | PRN
  Administered 2021-02-02: 21.78 via INTRAVENOUS

## 2021-02-02 NOTE — Progress Notes (Signed)
Patient stable throughout shift, continues to complains on ongoing acute pain to right hip. He verbalized minimum relief with current pain regimen.

## 2021-02-02 NOTE — Progress Notes (Signed)
[place]  Telephone:([phone] [phone]   Name: Donald Lowery Date: 02/02/2021 MRN: [id number]  DOB: 08/07/1950  Patient Care Team: [name], NP as PCP - General (Internal Medicine) [name], MD as PCP - Cardiology (Cardiology) Telford Nab, RN as Oncology Nurse Navigator    REASON FOR CONSULTATION: Donald Lowery is a 70 y.o. 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  Patient was recently hospitalized 01/12/2021 to 01/19/2021 with intractable back pain.  He was found to have a new compression fracture of L3 and underwent kyphoplasty.  Patient was readmitted 01/21/2021 with right hip pain.  He was started on XRT to the lumbar spine with pain felt to be referred from his known spinal metastatic disease.  Palliative care was consulted to help with pain management.[name]    CODE STATUS: DNR  PAST MEDICAL HISTORY: Past Medical History:  Diagnosis Date   Aortic atherosclerosis ([place])    Bochdalek hernia 09/21/2020   fatty   Coronary artery disease    a. 04/2018 NSTEMI/Cath: LM nl, LAD 50p, 19m/d diffuse-small caliber, LCX heavily Ca2+ sev prox/mid dzs, RCA 90 diff distal dzs into RPL and RPDA. EF 50-55%-->Med Rx.   Hepatic steatosis    History of 2019 novel coronavirus disease (COVID-19) 10/12/2020   History of echocardiogram    a. 04/2018 Echo: EF 55-60%, no rwma, mild MR, mildly dil LA. Nl RV fxn.   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 Quit 04/2018.    PAST SURGICAL HISTORY:  Past Surgical History:   Procedure Laterality Date   BRAIN SURGERY     CARDIAC CATHETERIZATION     IR IMAGING GUIDED PORT INSERTION  10/28/2020   KYPHOPLASTY N/A 01/17/2021   Procedure: Claybon Jabs RFA;  Surgeon: Hessie Knows, MD;  Location: [facility];  Service: Orthopedics;  Laterality: N/A;   LEFT HEART CATH AND CORONARY ANGIOGRAPHY N/A 05/16/2018   Procedure: LEFT HEART CATH AND CORONARY ANGIOGRAPHY poss PCI;  Surgeon: [name], MD;  Location: [facility];  Service: Cardiovascular;  Laterality: N/A;   VIDEO BRONCHOSCOPY WITH ENDOBRONCHIAL NAVIGATION N/A 10/24/2020   Procedure: ROBOTIC ASSISTED VIDEO BRONCHOSCOPY WITH ENDOBRONCHIAL NAVIGATION;  Surgeon: Tyler Pita, MD;  Location: [facility];  Service: Pulmonary;  Laterality: N/A;    HEMATOLOGY/ONCOLOGY HISTORY:  Oncology History  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[place])  09/21/2020 Initial Diagnosis   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[place])   10/28/2020 Cancer Staging   Staging form: Lung, AJCC 8th Edition - Clinical stage from 10/28/2020: Stage IVA (cT4, cN0, pM1a) - Signed by Lloyd Huger, MD on 10/28/2020   11/03/2020 - 01/11/2021 Chemotherapy         02/01/2021 -  Chemotherapy    Patient is on Treatment Plan: LUNG NSCLC FLAT DOSE PEMBROLIZUMAB Q21D        ALLERGIES:  has No Known Allergies.  MEDICATIONS:  Current Facility-Administered Medications  Medication Dose Route Frequency Provider Last Rate Last Admin   acetaminophen (TYLENOL) tablet 650 mg  650 mg Oral Q6H PRN  Mansy, Jan A, MD       Or   acetaminophen (TYLENOL) suppository 650 mg  650 mg Rectal Q6H PRN Mansy, Jan A, MD       acetaminophen (TYLENOL) tablet 1,000 mg  1,000 mg Oral TID [name], MD   1,000 mg at 02/02/21 4098   ALPRAZolam Duanne Moron) tablet 0.25 mg  0.25 mg Oral BID PRN Lloyd Huger, MD   0.25 mg at 02/01/21 2201   aspirin EC tablet 81 mg  81 mg Oral Daily Mansy, Jan A, MD   81 mg at 02/02/21 1191   bisacodyl (DULCOLAX) suppository 10  mg  10 mg Rectal Daily PRN [name], MD   10 mg at 01/24/21 1441   dexamethasone (DECADRON) tablet 4 mg  4 mg Oral Q12H Desmond Szabo, [name], NP   4 mg at 02/01/21 2206   dicyclomine (BENTYL) capsule 10 mg  10 mg Oral TID PRN Mansy, Jan A, MD       DULoxetine (CYMBALTA) DR capsule 30 mg  30 mg Oral Daily Kerryn Tennant, [name], NP   30 mg at 02/01/21 1104   enoxaparin (LOVENOX) injection 40 mg  40 mg Subcutaneous Q24H Mansy, Jan A, MD   40 mg at 02/02/21 0924   ezetimibe (ZETIA) tablet 10 mg  10 mg Oral Daily Mansy, Jan A, MD   10 mg at 02/01/21 1103   feeding supplement (ENSURE ENLIVE / ENSURE PLUS) liquid 237 mL  237 mL Oral TID BM Jennye Boroughs, MD   237 mL at 02/02/21 1410   fentaNYL (DURAGESIC) 100 MCG/HR 1 patch  1 patch Transdermal Q72H Miaya Lafontant, [name], NP   1 patch at 01/30/21 1507   gabapentin (NEURONTIN) tablet 600 mg  600 mg Oral TID [name], MD   600 mg at 02/02/21 4782   HYDROmorphone (DILAUDID) injection 2 mg  2 mg Intravenous N5A PRN Pershing Proud, NP   2 mg at 02/02/21 1204   HYDROmorphone (DILAUDID) tablet 4-6 mg  4-6 mg Oral Q4H PRN Kairee Kozma, [name], NP   6 mg at 02/02/21 1409   icosapent Ethyl (VASCEPA) 1 g capsule 2 g  2 g Oral BID Mansy, Jan A, MD   2 g at 02/01/21 2215   isosorbide mononitrate (IMDUR) 24 hr tablet 30 mg  30 mg Oral Daily Mansy, Jan A, MD   30 mg at 02/01/21 1103   magnesium hydroxide (MILK OF MAGNESIA) suspension 30 mL  30 mL Oral Daily PRN Mansy, Jan A, MD       methocarbamol (ROBAXIN) tablet 750 mg  750 mg Oral QID [name], MD   750 mg at 02/02/21 2130   metoprolol tartrate (LOPRESSOR) tablet 25 mg  25 mg Oral BID Mansy, Jan A, MD   25 mg at 02/02/21 8657   multivitamin with minerals tablet 1 tablet  1 tablet Oral Daily [name], MD   1 tablet at 02/02/21 8469   Muscle Rub CREA 1 application  1 application Topical Daily PRN Mansy, Jan A, MD       naloxone [facility]) injection 0.4 mg  0.4 mg Intravenous PRN Mansy,  Jan A, MD       ondansetron [facility]) tablet 4 mg  4 mg Oral Q6H PRN Mansy, Jan A, MD       Or   ondansetron [facility]) injection 4 mg  4 mg Intravenous Q6H PRN Mansy, Jan A, MD   4 mg at 02/01/21  0755   polyethylene glycol (MIRALAX / GLYCOLAX) packet 17 g  17 g Oral Daily [name], MD   17 g at 02/02/21 1761   senna-docusate (Senokot-S) tablet 2 tablet  2 tablet Oral BID Jennye Boroughs, MD   2 tablet at 02/02/21 6073   sucralfate (CARAFATE) tablet 0.5 g  0.5 g Oral TID [facility] Mansy, Jan A, MD   0.5 g at 02/02/21 7106   traZODone (DESYREL) tablet 25 mg  25 mg Oral QHS PRN Mansy, Jan A, MD   25 mg at 01/31/21 2118   vitamin B-12 (CYANOCOBALAMIN) tablet 1,000 mcg  1,000 mcg Oral Daily Mansy, Jan A, MD   1,000 mcg at 02/02/21 0923    VITAL SIGNS: BP 124/65 (BP Location: Right Arm)   Pulse 73   Temp 98 F (36.7 C)   Resp 18   Ht 5' 4.02" (1.626 m)   Wt 113 lb 15.7 oz (51.7 kg)   SpO2 100%   BMI 19.55 kg/m  Filed Weights   01/31/21 0500 02/01/21 0500 02/02/21 0500  Weight: 113 lb 5.1 oz (51.4 kg) 113 lb 5.1 oz (51.4 kg) 113 lb 15.7 oz (51.7 kg)    Estimated body mass index is 19.55 kg/m as calculated from the following:   Height as of this encounter: 5' 4.02" (1.626 m).   Weight as of this encounter: 113 lb 15.7 oz (51.7 kg).  LABS: CBC:    Component Value Date/Time   WBC 7.8 01/29/2021 0537   HGB 8.2 (L) 01/29/2021 0537   HCT 25.0 (L) 01/29/2021 0537   PLT 270 01/29/2021 0537   MCV 91.9 01/29/2021 0537   NEUTROABS 5.7 01/21/2021 2001   LYMPHSABS 0.4 (L) 01/21/2021 2001   MONOABS 0.4 01/21/2021 2001   EOSABS 0.0 01/21/2021 2001   BASOSABS 0.0 01/21/2021 2001   Comprehensive Metabolic Panel:    Component Value Date/Time   NA 133 (L) 01/29/2021 0537   NA 138 12/16/2018 0933   K 4.5 01/29/2021 0537   CL 95 (L) 01/29/2021 0537   CO2 30 01/29/2021 0537   BUN 17 01/29/2021 0537   BUN 14 12/16/2018 0933   CREATININE 0.54 (L) 01/29/2021 0537   GLUCOSE 145 (H)  01/29/2021 0537   CALCIUM 8.4 (L) 01/29/2021 0537   AST 23 01/21/2021 2001   ALT 18 01/21/2021 2001   ALKPHOS 116 01/21/2021 2001   BILITOT 1.0 01/21/2021 2001   PROT 6.2 (L) 01/21/2021 2001   ALBUMIN 2.5 (L) 01/21/2021 2001    RADIOGRAPHIC STUDIES: DG Lumbar Spine 2-3 Views  Result Date: 01/17/2021 CLINICAL DATA:  Kyphoplasty. EXAM: DG C-ARM 1-60 MIN; LUMBAR SPINE - 2-3 VIEW FLUOROSCOPY TIME:  Fluoroscopy Time:  2 minutes and 2 seconds. Number of Acquired Spot Images: 7 COMPARISON:  MRI lumbar spine 01/13/2021. FINDINGS: Seven C-arm fluoroscopic images were obtained intraoperatively and submitted for post operative interpretation. These images demonstrate kyphoplasty of the L3 vertebral body with slight lateral extravasation. Please see the performing provider's procedural report for further detail. IMPRESSION: Intraoperative fluoroscopy, as detailed above. Electronically Signed   By: Margaretha Sheffield M.D.   On: 01/17/2021 17:01   DG Lumbar Spine 2-3 Views  Result Date: 01/12/2021 CLINICAL DATA:  Back pain, lung cancer, evaluate for metastases EXAM: LUMBAR SPINE - 2-3 VIEW COMPARISON:  01/03/2021 FINDINGS: Mild superior endplate compression fracture deformity at L3, with 15% loss of height, new from the prior. This reflects a pathologic fracture. Mild degenerative changes of the visualized thoracolumbar spine. Visualized bony  pelvis appears intact. IMPRESSION: Mild superior endplate compression fracture deformity at L3, with 15% loss of height, new from the prior. This reflects a pathologic fracture. Electronically Signed   By: Julian Hy M.D.   On: 01/12/2021 20:04   CT Lumbar Spine Wo Contrast  Result Date: 01/21/2021 CLINICAL DATA:  Initial evaluation for worsening back pain, right hip pain, recent surgery. EXAM: CT LUMBAR SPINE WITHOUT CONTRAST TECHNIQUE: Multidetector CT imaging of the lumbar spine was performed without intravenous contrast administration. Multiplanar CT image  reconstructions were also generated. COMPARISON:  MRI from 01/13/2021. FINDINGS: Segmentation: Standard. Lowest well-formed disc space labeled the L5-S1 level. Alignment: Mild sigmoid scoliotic curvature of the thoracolumbar spine. 3 mm retrolisthesis of L1 on L2. Vertebrae: There has been interval performance of vertebral augmentation for previously identified pathologic fracture at L3. No visible complicating features. Associated height loss is stable without significant interval collapse. Posterior convex bowing of the L3 vertebral body with resultant moderate spinal stenosis is grossly stable. Extraosseous extension of tumor into the adjacent right psoas musculature also grossly similar. Vertebral body height otherwise maintained with no other acute or interval fracture. Visualized sacrum intact. SI joints symmetric and normal. There is an additional lytic metastatic lesion involving the left iliac wing (series 7, image 45). No other visible osseous metastatic disease. Paraspinal and other soft tissues: Extraosseous extension of tumor into the right psoas musculature related to the L3 metastasis, grossly stable from prior MRI. Paraspinous soft tissues demonstrate no other acute finding. Advanced aorto bi-iliac atherosclerotic disease. Disc levels: L1-2: Trace retrolisthesis with mild disc bulge. No spinal stenosis. Foramina remain patent. L2-3: Mild disc bulge with facet hypertrophy. No spinal stenosis. Foramina remain patent. L3-4: Degenerative intervertebral disc space narrowing with diffuse disc bulge. Mild facet hypertrophy. Moderate to advanced spinal stenosis related to posterior convex bowing related to the pathologic L3 compression fracture noted, grossly similar. Moderate to severe right with mild left L3 foraminal stenosis, grossly stable. L4-5: Mild disc bulge with facet hypertrophy. No spinal stenosis. No more than mild bilateral L4 foraminal narrowing. L5-S1: Mild disc bulge. Right greater than left  facet arthrosis. No spinal stenosis. Foramina remain patent. IMPRESSION: 1. Interval performance of vertebral augmentation for previously identified pathologic fracture at L3. No visible complication. Associated height loss is stable without further interval collapse. Posterior convex bowing of the L3 vertebral body with resultant moderate to advanced spinal stenosis, similar to previous. Moderate to severe right L3 foraminal stenosis also grossly stable. 2. Additional lytic metastatic lesion involving the left iliac wing. 3. No other acute abnormality within the lumbar spine. 4. Aortic Atherosclerosis (ICD10-I70.0). Electronically Signed   By: Jeannine Boga M.D.   On: 01/21/2021 20:40   MR Lumbar Spine W Wo Contrast  Result Date: 01/13/2021 CLINICAL DATA:  Spine fracture, lumbosacral, pathological new pathologic compression fracture lumbar spine EXAM: MRI LUMBAR SPINE WITHOUT AND WITH CONTRAST TECHNIQUE: Multiplanar and multiecho pulse sequences of the lumbar spine were obtained without and with intravenous contrast. CONTRAST:  59mL GADAVIST GADOBUTROL 1 MMOL/ML IV SOLN COMPARISON:  PET-CT 12/12/2020.  Lumbar radiographs 01/12/2021. FINDINGS: Segmentation: Standard segmentation is assumed. The inferior-most fully formed intervertebral disc is labeled L5-S1. Alignment:  Substantial sagittal subluxation.  Levocurvature. Vertebrae: Abnormal enhancement, T1 hypointensity and STIR hyperintensity involving the entire L3 vertebral body and bilateral L3 pedicles, compatible with metastasis. Pathologic fracture with 50% vertebral body height loss. Extraosseous extension of tumor with right anterior paraspinal soft tissue mass measuring 4.2 cm with surrounding edema/enhancement. There also is posterior  epidural extension of enhancing tumor into the canal and right foramen with associated severe canal stenosis and moderate to severe right foraminal stenosis. Conus medullaris and cauda equina: Conus extends to the L1  level. Conus appears normal Paraspinal and other soft tissues: Right anterolateral paraspinal soft tissue mass at L3 with extensive surrounding edema and enhancement, as detailed above. Disc levels: T12-L1: No significant disc protrusion, foraminal stenosis, or canal stenosis. L1-L2: Mild disc bulging without significant canal or foraminal stenosis. L2-L3: Mild disc bulging without significant canal or foraminal stenosis. L3-L4: Pathologic fracture at L3 with posterior epidural extension of tumor into the canal and right foramen, as described above. Resulting severe canal stenosis and moderate to severe right foraminal stenosis. L4-L5: Small broad disc bulge and bilateral facet hypertrophy with mild right foraminal stenosis. No significant canal or left foraminal stenosis. L5-S1: Facet hypertrophy without significant canal or foraminal stenosis. Other: Please see MRI of the pelvis for evaluation of the pelvis/sacrum. IMPRESSION: 1. Osseous metastatic disease at L3 with pathologic fracture (50% height loss) and posterior epidural extension of tumor into the canal and right foramen. Resulting severe canal stenosis and moderate to severe right foraminal stenosis. Associated right anterolateral 4.2 cm paraspinal soft tissue mass at L3 with surrounding inflammation/edema. 2. No evidence of osseous metastatic disease in the other lumbar vertebral bodies. Please see concurrent MRI of the pelvis for evaluation of the pelvis/sacrum. Electronically Signed   By: Margaretha Sheffield M.D.   On: 01/13/2021 12:13   MR PELVIS W WO CONTRAST  Result Date: 01/13/2021 CLINICAL DATA:  70 y.o. male with medical history significant for stage IV non-small cell carcinoma of left upper lung (10/24/20) with bone metastasis, lytic lesion in his left iliac crest postchemotherapy and radiation, weight loss EXAM: MRI PELVIS WITHOUT AND WITH CONTRAST TECHNIQUE: Multiplanar multisequence MR imaging of the pelvis was performed both before and after  administration of intravenous contrast. CONTRAST:  43mL GADAVIST GADOBUTROL 1 MMOL/ML IV SOLN COMPARISON:  PET-CT 12/12/2020 FINDINGS: Bones: No hip fracture, dislocation or avascular necrosis. 3.3 x 3.8 cm soft tissue mass with bone destruction involving the left iliac crest with surrounding bone marrow edema. Mild edema in the adjacent iliacus muscle which may related to radiation changes. 12 mm focus of osseous metastatic disease in the right iliac crest without a soft tissue component. 5 mm osseous metastatic disease in the S1 vertebral body. Normal sacrum and sacroiliac joints. No SI joint widening or erosive changes. Articular cartilage and labrum Articular cartilage:  No chondral defect. Labrum: Grossly intact, but evaluation is limited by lack of intraarticular fluid. Joint or bursal effusion Joint effusion:  No hip joint effusion.  No SI joint effusion. Bursae:  No bursa formation. Muscles and tendons Flexors: Mild perifascial edema involving the iliacus muscles bilaterally. Extensors: Normal. Abductors: Normal. Adductors: Normal. Gluteals: Normal. Hamstrings: Normal. Other findings No pelvic free fluid. No fluid collection or hematoma. No inguinal lymphadenopathy. No inguinal hernia. IMPRESSION: 1. Osseous metastatic disease involving the left iliac crest with a large soft tissue component measuring 3.3 x 3.8 cm and surrounding bone marrow edema. Mild edema in the adjacent iliacus muscle which may related to radiation changes. 12 mm focus of osseous metastatic disease in the right iliac crest. 5 mm osseous metastatic disease in the S1 vertebral body. 2. No hip fracture, dislocation or avascular necrosis. Electronically Signed   By: Kathreen Devoid M.D.   On: 01/13/2021 13:18   CT Hip Right Wo Contrast  Result Date: 01/21/2021 CLINICAL DATA:  Fall, right hip pain EXAM: CT OF THE RIGHT HIP WITHOUT CONTRAST TECHNIQUE: Multidetector CT imaging of the right hip was performed according to the standard protocol.  Multiplanar CT image reconstructions were also generated. COMPARISON:  X-ray 01/21/2021 FINDINGS: Bones/Joint/Cartilage Right hip is intact without fracture or dislocation. No evidence of avascular necrosis of the femoral head. Right hip joint space is maintained. No hip joint effusion is evident. Visualized portion of the right hemipelvis is intact. Pubic symphysis and right sacroiliac joint intact without diastasis. No lytic or sclerotic bony lesions are identified. Site of known right iliac crest metastatic lesion is not included within the field of view on the current study. Ligaments Suboptimally assessed by CT. Muscles and Tendons No acute musculotendinous abnormality by CT. Soft tissues Mild subcutaneous edema overlying the greater trochanter. No organized fluid collection or hematoma. No abnormally enlarged right inguinal lymph nodes. Bilateral hydroceles. Atherosclerotic vascular calcifications are present. IMPRESSION: 1. No acute fracture or dislocation of the right hip. 2. Bilateral hydroceles. Electronically Signed   By: Davina Poke D.O.   On: 01/21/2021 20:22   MR HIP RIGHT WO CONTRAST  Result Date: 02/01/2021 CLINICAL DATA:  Severe right hip pain.  History of lung cancer. EXAM: MR OF THE RIGHT HIP WITHOUT CONTRAST TECHNIQUE: Multiplanar, multisequence MR imaging was performed. No intravenous contrast was administered. COMPARISON:  01/13/2021 FINDINGS: Bones: No hip fracture, dislocation or avascular necrosis. Interval enlargement of a 4.4 x 4.5 cm soft tissue mass with bone destruction involving the left iliac crest with surrounding bone marrow edema. Mild edema in the adjacent iliacus muscle which may related to radiation changes. 20 mm osseous metastatic disease in the right iliac crest without a soft tissue component. 8 mm osseous metastatic disease in the S1 vertebral body. Normal sacrum and sacroiliac joints. No SI joint widening or erosive changes. Mild lower lumbar spine spondylosis.  Articular cartilage and labrum Articular cartilage:  No chondral defect. Labrum: Grossly intact, but evaluation is limited by lack of intraarticular fluid. Joint or bursal effusion Joint effusion:  No hip joint effusion.  No SI joint effusion. Bursae:  No bursa formation. Muscles and tendons Flexors: Muscle edema in the iliacus muscle bilaterally and right psoas muscle which may reflect myositis versus muscle strain. Extensors: Normal. Abductors: Normal. Adductors: Muscle edema in the adductor musculature bilaterally concerning for mild muscle strain versus myositis. Gluteals: Small partial tear of the periphery of the right gluteus minimus musculotendinous junction. Hamstrings: Normal. Other findings No pelvic free fluid. No fluid collection or hematoma. No inguinal lymphadenopathy. No inguinal hernia. IMPRESSION: 1. Interval enlargement of the left iliac crest soft tissue mass currently measuring 4.4 x 4.5 cm. Interval enlargement of the right iliac crest osseous metastatic disease measuring 20 mm. Interval enlargement of the S1 vertebral body focus of osseous metastatic disease. Overall appearance is consistent with progression of metastatic disease. 2. No hip fracture, dislocation or avascular necrosis. 3. Muscle edema in the iliacus muscle bilaterally and right psoas muscle as well as bilateral adductor musculature. Differential considerations include mild muscle strain versus myositis secondary to radiation therapy. 4. Small partial tear of the periphery of the right gluteus minimus musculotendinous junction. Electronically Signed   By: Kathreen Devoid M.D.   On: 02/01/2021 07:15   DG C-Arm 1-60 Min  Result Date: 01/17/2021 CLINICAL DATA:  Kyphoplasty. EXAM: DG C-ARM 1-60 MIN; LUMBAR SPINE - 2-3 VIEW FLUOROSCOPY TIME:  Fluoroscopy Time:  2 minutes and 2 seconds. Number of Acquired Spot Images: 7 COMPARISON:  MRI  lumbar spine 01/13/2021. FINDINGS: Seven C-arm fluoroscopic images were obtained intraoperatively  and submitted for post operative interpretation. These images demonstrate kyphoplasty of the L3 vertebral body with slight lateral extravasation. Please see the performing provider's procedural report for further detail. IMPRESSION: Intraoperative fluoroscopy, as detailed above. Electronically Signed   By: Margaretha Sheffield M.D.   On: 01/17/2021 17:01   DG Hip Unilat  With Pelvis 2-3 Views Right  Result Date: 01/21/2021 CLINICAL DATA:  Right hip pain. History of metastatic non-small cell lung cancer EXAM: DG HIP (WITH OR WITHOUT PELVIS) 2-3V RIGHT COMPARISON:  MRI pelvis 01/13/2021 FINDINGS: No acute fracture or dislocation. Right hip joint space is preserved. Known destructive lesion at the left iliac crest is not well seen, partially obscured by overlying stool and bowel gas. Small marrow replacing lesion of the right iliac crest seen on recent MRI is not well demonstrated radiographically. Otherwise, no evidence of a new destructive bone lesion. IMPRESSION: 1. No acute fracture or dislocation. 2. Known metastatic lesions within the bilateral iliac crests are not well seen radiographically. Electronically Signed   By: Davina Poke D.O.   On: 01/21/2021 18:25    PERFORMANCE STATUS (ECOG) : 3 - Symptomatic, >50% confined to bed  Review of Systems Unless otherwise noted, a complete review of systems is negative.  Physical Exam General: NAD Cardiovascular: regular rate and rhythm Pulmonary: clear ant fields Abdomen: soft, nontender, + bowel sounds GU: no suprapubic tenderness Extremities: no edema, no joint deformities Skin: no rashes Neurological: Weakness but otherwise nonfocal  IMPRESSION: Patient reports pain is improved.  He currently rates as 9 out of 10 but says that he feels much more comfortable.  Patient is status post nuclear bone scan which is pending radiology review.  Plan is for outpatient radiation oncology follow-up for XRT.  PLAN: -Continue current scope of  treatment -Continue fentanyl/hydromorphone/dexamethasone -Continue duloxetine 30 mg daily -PT/OT   Time Total: 15 minutes  Visit consisted of counseling and education dealing with the complex and emotionally intense issues of symptom management and palliative care in the setting of serious and potentially life-threatening illness.Greater than 50%  of this time was spent counseling and coordinating care related to the above assessment and plan.  Signed by: Altha Harm, PhD, NP-C

## 2021-02-02 NOTE — Progress Notes (Signed)
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   OT comments  Pt seen for OT Tx  this date to f/u re: safety with ADLs/ADL mobility. Pt reluctant to participate and requires extensive encouragement to participate on some level. OT engages pt in sup<>sit EOB with SUPV (HOB elevated and use of bed rails). Pt demos G sitting balance. He is able to perform UB grooming tasks with SETUP and UB bathing with MIN A. Pt not agreeable to mobilizing further this date citing pain. OT offers extensive education re: importance of OOB Activity. Pt with moderate reception. Will continue to follow. Continue to anticipate that pt will require STR upon d/c.    Recommendations for follow up therapy are one component of a multi-disciplinary discharge planning process, led by the attending physician.  Recommendations may be updated based on patient status, additional functional criteria and insurance authorization.    Follow Up Recommendations  SNF;Other (comment)    Equipment Recommendations  Tub/shower seat    Recommendations for Other Services      Precautions / Restrictions Precautions Precautions: Fall Required Braces or Orthoses: Spinal Brace Restrictions Weight Bearing Restrictions: No       Mobility Bed Mobility Overal bed mobility: Needs Assistance Bed Mobility: Supine to Sit;Sit to Supine   Sidelying to sit: Supervision Supine to sit: Supervision     General bed mobility comments: use of  bed rails and HOB elevated    Transfers                      Balance Overall balance assessment: Modified Independent Sitting-balance support: Feet supported Sitting balance-Leahy Scale: Good Sitting balance - Comments: G static sitting                                   ADL either performed or assessed with clinical judgement   ADL Overall ADL's : Needs assistance/impaired     Grooming: Wash/dry face;Set up   Upper Body Bathing: Minimal assistance;Sitting Upper Body Bathing Details (indicate cue type and reason): SUPV/SETUP for anterior, MIN A for posterior in EOB sitting                                 Vision Patient Visual Report: No change from baseline     Perception     Praxis      Cognition Arousal/Alertness: Awake/alert Behavior During Therapy: WFL for tasks assessed/performed Overall Cognitive Status: Within Functional Limits for tasks assessed                                 General Comments: pleasant and particiaptory although reluctant requiring gentle encouragement        Exercises Other Exercises Other Exercises: OT engages pt in UB bathing   Shoulder Instructions  General Comments      Pertinent Vitals/ Pain       Pain Assessment: 0-10 Pain Score: 9  Pain Location: back pain Pain Descriptors / Indicators: Discomfort (no visual signs of pain) Pain Intervention(s): Limited activity within patient's tolerance  Home Living                                          Prior Functioning/Environment              Frequency  Min 1X/week        Progress Toward Goals  OT Goals(current goals can now be found in the care plan section)  Progress towards OT goals: Progressing toward goals (self limiting, very minimal progression)  Acute Rehab OT Goals Patient Stated Goal: improve pain,  go to rehab OT Goal Formulation: With patient Time For Goal Achievement:  02/06/21 Potential to Achieve Goals: Good  Plan Frequency remains appropriate    Co-evaluation                 AM-PAC OT "6 Clicks" Daily Activity     Outcome Measure   Help from another person eating meals?: None Help from another person taking care of personal grooming?: None Help from another person toileting, which includes using toliet, bedpan, or urinal?: A Lot Help from another person bathing (including washing, rinsing, drying)?: A Little Help from another person to put on and taking off regular upper body clothing?: None Help from another person to put on and taking off regular lower body clothing?: A Little 6 Click Score: 20    End of Session    OT Visit Diagnosis: Unsteadiness on feet (R26.81);Muscle weakness (generalized) (M62.81);Pain Pain - Right/Left: Right Pain - part of body: Hip   Activity Tolerance Patient limited by pain   Patient Left in bed;with call bell/phone within reach;with family/visitor present   Nurse Communication Patient requests pain meds        Time: 6979-4801 OT Time Calculation (min): 18 min  Charges: OT General Charges $OT Visit: 1 Visit OT Treatments $Self Care/Home Management : 8-22 mins  Gerrianne Scale, [place], OTR/L ascom [phone] 02/02/21, 10:22 AM

## 2021-02-02 NOTE — TOC Progression Note (Signed)
Transition of Care [facility]) - Progression Note    Patient Details  Name: Donald Lowery MRN: [id number] Date of Birth: 1950-06-13  Transition of Care [facility]) CM/SW [place], RN Phone Number: 02/02/2021, 10:45 AM  Clinical Narrative:    Palliative and oncology as well as attending continue to work with the patient to get his pain under control,  TOC to continue to follow for DC plan and needs He is set up with [facility] for [facility] services, has DME at home       Expected Discharge Plan and Services                                                 Social Determinants of Health ([place]) Interventions    Readmission Risk Interventions Readmission Risk Prevention Plan 01/15/2021  Transportation Screening Complete  PCP or Specialist Appt within 3-5 Days Complete  Social Work Consult for [place] Planning/Counseling [place] Not Applicable  Medication Review Press photographer) Complete  Some recent data might be hidden

## 2021-02-02 NOTE — Progress Notes (Signed)
Progress Note    [name]  QQP:[id number] DOB: April 26, 1951  DOA: 01/21/2021 PCP: [name], NP      Brief Narrative:    Medical records reviewed and are as summarized below:  Donald Lowery is a 70 y.o. 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  He presented to the hospital with acute right hip pain.  Right hip pain was attributed to metastatic disease from lung cancer.  He was treated with analgesics.  Palliative care team was consulted to assist with pain control.  He also received palliative radiation therapy.  Repeat bone scan is done-pending final results.  He might need another session of palliative radiation therapy as an outpatient.  Patient currently wants to continue current level of care.  Subjective. Patient is seen and examined today.  Complaining of 8-10 over 10 pain in right hip but appears quite comfortable and talking comfortably with his cousins. One of the cousin was asking about medical marijuana as pain management-told that I have no experience and I will defer that decision to palliative care and oncology.  Assessment/Plan:   Active Problems:   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HCC)   Intractable pain   Right hip pain   Palliative care encounter   Nutrition Problem: Severe Malnutrition (in the context of chronic illness) Etiology: cancer and cancer related treatments, poor appetite  Signs/Symptoms: severe muscle depletion, severe fat depletion   Severe right hip pain, recurrent mechanical fall, inability to ambulate, recent L3 kyphoplasty on 01/17/2021: Continue analgesics as needed for pain.  Case discussed with Dr. Jolee Ewing, NP with palliative  care and radiation oncologist, Dr. Eulas Post, via secure chat.  MRI right hip has been reviewed.    (Overall showed progression of metastatic disease in the left and right iliac crest and S1 vertebral body.  He also showed muscle edema in the iliac [place] muscle bilaterally, right psoas muscle and bilateral abductor muscles.  It also showed small partial tear of the periphery of the right gluteus minimus musculotendinous junction).    Whole-body bone scan was recommended for further evaluation by oncology team, done pending results. Palliative care was also consulted for pain management. -Continue current regimen. -Most likely will get outpatient palliative radiation.  Stage IV non-small cell lung cancer with bony metastasis: Continue radiation therapy as prescribed by radiation oncologist. Outpatient follow-up with oncologist.  Anemia of chronic disease and chronic hyponatremia: Hemoglobin and sodium level are stable.  Lipitor had been discontinued because of myalgia.  Other comorbidities include CAD, hyperlipidemia, vitamin B12 deficiency, GERD.  He is still refusing physical therapy.    Diet Order             Diet regular Room service appropriate? Yes; Fluid consistency: Thin  Diet effective now                  Consultants: Oncologist Palliative care  Procedures: None  Spoke for Medications:    acetaminophen  1,000 mg Oral TID   aspirin EC  81 mg Oral Daily   dexamethasone  4 mg Oral Q12H   DULoxetine  30 mg Oral Daily   enoxaparin (LOVENOX) injection  40 mg Subcutaneous Q24H   ezetimibe  10 mg Oral Daily  feeding supplement  237 mL Oral TID BM   fentaNYL  1 patch Transdermal Q72H   gabapentin  600 mg Oral TID   icosapent Ethyl  2 g Oral BID   isosorbide mononitrate  30 mg Oral Daily   methocarbamol  750 mg Oral QID   metoprolol tartrate  25 mg Oral BID   multivitamin with minerals  1 tablet Oral Daily   polyethylene glycol  17 g Oral Daily   senna-docusate  2  tablet Oral BID   sucralfate  0.5 g Oral TID AC   vitamin B-12  1,000 mcg Oral Daily   Continuous Infusions:   Family Communication/Anticipated D/C date and plan/Code Status   DVT prophylaxis: enoxaparin (LOVENOX) injection 40 mg Start: 01/22/21 0800     Code Status: DNR  Family Communication: Discussed with cousin at bedside. Disposition Plan:    Status is: Inpatient  Remains inpatient appropriate because:Ongoing active pain requiring inpatient pain management  Dispo: The patient is from: Home              Anticipated d/c is to: Home              Patient currently is not medically stable to d/c.   Difficult to place patient No   Objective:    Vitals:   02/02/21 0500 02/02/21 0857 02/02/21 1133 02/02/21 1514  BP:  123/86 124/65 (!) 122/57  Pulse:  78 73 77  Resp:  15 18 18   Temp:  97.7 F (36.5 C) 98 F (36.7 C) 97.9 F (36.6 C)  TempSrc:  Oral    SpO2:  100% 100% 99%  Weight: 51.7 kg     Height:       No data found.   Intake/Output Summary (Last 24 hours) at 02/02/2021 1659 Last data filed at 02/02/2021 1420 Gross per 24 hour  Intake 480 ml  Output 1450 ml  Net -970 ml    Filed Weights   01/31/21 0500 02/01/21 0500 02/02/21 0500  Weight: 51.4 kg 51.4 kg 51.7 kg    Exam:  General.  Frail elderly man, in no acute distress. Pulmonary.  Lungs clear bilaterally, normal respiratory effort. CV.  Regular rate and rhythm, no JVD, rub or murmur. Abdomen.  Soft, nontender, nondistended, BS positive. CNS.  Alert and oriented.  No focal neurologic deficit. Extremities.  No edema, no cyanosis, pulses intact and symmetrical. Psychiatry.  Judgment and insight appears normal.    Data Reviewed:   I have personally reviewed following labs and imaging studies:  Labs: Labs show the following:   Basic Metabolic Panel: Recent Labs  Lab 01/29/21 0537  NA 133*  K 4.5  CL 95*  CO2 30  GLUCOSE 145*  BUN 17  CREATININE 0.54*  CALCIUM 8.4*     GFR Estimated Creatinine Clearance: 61.9 mL/min (A) (by C-G formula based on SCr of 0.54 mg/dL (L)). Liver Function Tests: No results for input(s): AST, ALT, ALKPHOS, BILITOT, PROT, ALBUMIN in the last 168 hours.  No results for input(s): LIPASE, AMYLASE in the last 168 hours. No results for input(s): AMMONIA in the last 168 hours. Coagulation profile No results for input(s): INR, PROTIME in the last 168 hours.  CBC: Recent Labs  Lab 01/29/21 0537  WBC 7.8  HGB 8.2*  HCT 25.0*  MCV 91.9  PLT 270    Cardiac Enzymes: No results for input(s): CKTOTAL, CKMB, CKMBINDEX, TROPONINI in the last 168 hours.  BNP (last 3 results) No results for input(s): PROBNP  in the last 8760 hours. CBG: Recent Labs  Lab 01/31/21 2017 02/01/21 2054 02/02/21 1112  GLUCAP 277* 254* 245*    D-Dimer: No results for input(s): DDIMER in the last 72 hours. Hgb A1c: No results for input(s): HGBA1C in the last 72 hours. Lipid Profile: No results for input(s): CHOL, HDL, LDLCALC, TRIG, CHOLHDL, LDLDIRECT in the last 72 hours.  Thyroid function studies: No results for input(s): TSH, T4TOTAL, T3FREE, THYROIDAB in the last 72 hours.  Invalid input(s): FREET3 Anemia work up: No results for input(s): VITAMINB12, FOLATE, FERRITIN, TIBC, IRON, RETICCTPCT in the last 72 hours. Sepsis Labs: Recent Labs  Lab 01/29/21 0537  WBC 7.8     Microbiology No results found for this or any previous visit (from the past 240 hour(s)).   Procedures and diagnostic studies:  NM Bone Scan Whole Body  Result Date: 02/02/2021 CLINICAL DATA:  Lung cancer, osseous metastatic disease, staging examination EXAM: NUCLEAR MEDICINE WHOLE BODY BONE SCAN TECHNIQUE: Whole body anterior and posterior images were obtained approximately 3 hours after intravenous injection of radiopharmaceutical. RADIOPHARMACEUTICALS:  21.7 mCi Technetium-9m MDP IV COMPARISON:  PET CT 12/12/2020 FINDINGS: There is a mixed lesion is seen  within the L3 vertebral body corresponding to the pathologic fracture better seen on prior PET CT examination and superimposed methylmethacrylate related to vertebral augmentation noted on PET CT CT examination of 01/21/2021. The known metastasis within the left iliac crest represents a a cold lesion with surrounding osteoblastic activity. Focal uptake within the a mid body of the sternum is suspicious for an osseous metastasis in this location. Normal uptake and excretion within uptake within the sternoclavicular joints, shoulders, cervicothoracic spine are likely degenerative in nature. Normal soft tissue distribution. The kidneys and bladder. IMPRESSION: Uptake related to known pathologic fracture within the L3 vertebral body with probable postprocedural changes of vertebral augmentation and left iliac crest metastasis. 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 This could be better assessed with dedicated CT examination of the chest. Electronically Signed   By: Fidela Salisbury M.D.   On: 02/02/2021 15:22   MR HIP RIGHT WO CONTRAST  Result Date: 02/01/2021 CLINICAL DATA:  Severe right hip pain.  History of lung cancer. EXAM: MR OF THE RIGHT HIP WITHOUT CONTRAST TECHNIQUE: Multiplanar, multisequence MR imaging was performed. No intravenous contrast was administered. COMPARISON:  01/13/2021 FINDINGS: Bones: No hip fracture, dislocation or avascular necrosis. Interval enlargement of a 4.4 x 4.5 cm soft tissue mass with bone destruction involving the left iliac crest with surrounding bone marrow edema. Mild edema in the adjacent iliacus muscle which may related to radiation changes. 20 mm osseous metastatic disease in the right iliac crest without a soft tissue component. 8 mm osseous metastatic disease in the S1 vertebral body. Normal sacrum and sacroiliac joints. No SI joint widening or erosive changes. Mild lower lumbar spine spondylosis. Articular cartilage and labrum Articular cartilage:  No chondral defect. Labrum:  Grossly intact, but evaluation is limited by lack of intraarticular fluid. Joint or bursal effusion Joint effusion:  No hip joint effusion.  No SI joint effusion. Bursae:  No bursa formation. Muscles and tendons Flexors: Muscle edema in the iliacus muscle bilaterally and right psoas muscle which may reflect myositis versus muscle strain. Extensors: Normal. Abductors: Normal. Adductors: Muscle edema in the adductor musculature bilaterally concerning for mild muscle strain versus myositis. Gluteals: Small partial tear of the periphery of the right gluteus minimus musculotendinous junction. Hamstrings: Normal. Other findings No pelvic free fluid. No fluid collection or hematoma. No inguinal lymphadenopathy.  No inguinal hernia. IMPRESSION: 1. Interval enlargement of the left iliac crest soft tissue mass currently measuring 4.4 x 4.5 cm. Interval enlargement of the right iliac crest osseous metastatic disease measuring 20 mm. Interval enlargement of the S1 vertebral body focus of osseous metastatic disease. Overall appearance is consistent with progression of metastatic disease. 2. No hip fracture, dislocation or avascular necrosis. 3. Muscle edema in the iliacus muscle bilaterally and right psoas muscle as well as bilateral adductor musculature. Differential considerations include mild muscle strain versus myositis secondary to radiation therapy. 4. Small partial tear of the periphery of the right gluteus minimus musculotendinous junction. Electronically Signed   By: Kathreen Devoid M.D.   On: 02/01/2021 07:15      LOS: 10 days   [facility]   Pager on [email or web address]. If 7PM-7AM, please contact night-coverage at [email or web address]     02/02/2021, 4:59 PM

## 2021-02-03 ENCOUNTER — Ambulatory Visit: Payer: Medicare HMO

## 2021-02-03 DIAGNOSIS — R296 Repeated falls: Secondary | ICD-10-CM | POA: Diagnosis not present

## 2021-02-03 DIAGNOSIS — M5459 Other low back pain: Secondary | ICD-10-CM | POA: Diagnosis not present

## 2021-02-03 DIAGNOSIS — R52 Pain, unspecified: Secondary | ICD-10-CM | POA: Diagnosis not present

## 2021-02-03 DIAGNOSIS — C3492 Malignant neoplasm of unspecified part of left bronchus or lung: Secondary | ICD-10-CM | POA: Diagnosis not present

## 2021-02-03 DIAGNOSIS — M25551 Pain in right hip: Secondary | ICD-10-CM | POA: Diagnosis not present

## 2021-02-03 LAB — GLUCOSE, CAPILLARY
Glucose-Capillary: 189 mg/dL — ABNORMAL HIGH (ref 70–99)
Glucose-Capillary: 197 mg/dL — ABNORMAL HIGH (ref 70–99)
Glucose-Capillary: 171 mg/dL — ABNORMAL HIGH (ref 70–99)
Glucose-Capillary: 200 mg/dL — ABNORMAL HIGH (ref 70–99)

## 2021-02-03 MED ORDER — INSULIN ASPART 100 UNIT/ML IJ SOLN
0.0000 [IU] | Freq: Three times a day (TID) | INTRAMUSCULAR | Status: DC
  Administered 2021-02-03 (×2): 3 [IU] via SUBCUTANEOUS
  Administered 2021-02-04: 8 [IU] via SUBCUTANEOUS
  Administered 2021-02-04: 3 [IU] via SUBCUTANEOUS
  Filled 2021-02-03 (×4): qty 1

## 2021-02-03 MED ORDER — INSULIN ASPART 100 UNIT/ML IJ SOLN
0.0000 [IU] | Freq: Every day | INTRAMUSCULAR | Status: DC
  Filled 2021-02-03: qty 1

## 2021-02-03 NOTE — TOC Transition Note (Signed)
Transition of Care [facility]) - CM/SW Discharge Note   Patient Details  Name: Donald Lowery MRN: [id number] Date of Birth: April 11, 1951  Transition of Care [facility]) CM/SW Contact:  Conception Oms, RN Phone Number: 02/03/2021, 12:01 PM   Clinical Narrative:  The patient was able to walk with PT and use stairs, he has DME at home and does not need additional, He is set up with [place],  no additional needs          Patient Goals and CMS Choice        Discharge Placement                       Discharge Plan and Services                                     Social Determinants of Health ([place]) Interventions     Readmission Risk Interventions Readmission Risk Prevention Plan 01/15/2021  Transportation Screening Complete  PCP or Specialist Appt within 3-5 Days Complete  Social Work Consult for [place] Planning/Counseling Complete  Palliative Care Screening Not Applicable  Medication Review Press photographer) Complete  Some recent data might be hidden

## 2021-02-03 NOTE — Progress Notes (Signed)
Progress Note    Donald Lowery  PXT:[id number] DOB: Jan 07, 1951  DOA: 01/21/2021 PCP: [name], NP      Brief Narrative:    Medical records reviewed and are as summarized below:  Donald Lowery is a 70 y.o. 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  He presented to the hospital with acute right hip pain.  Right hip pain was attributed to metastatic disease from lung cancer.  He was treated with analgesics.  Palliative care team was consulted to assist with pain control.  He also received palliative radiation therapy.  Repeat bone scan is done-pending final results.  He might need another session of palliative radiation therapy as an outpatient.  Patient currently wants to continue current level of care.  Subjective. Pain seems to have better controlled with current regimen.  Talked with oncology and palliative care at cancer center, they will continue palliative radiation as an outpatient. Wife requesting 1 more day so she can get help at home.  Assessment/Plan:   Active Problems:   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HCC)   Intractable pain   Right hip pain   Palliative care encounter   Multiple falls   Nutrition Problem: Severe Malnutrition (in the context of chronic illness) Etiology: cancer and cancer related treatments, poor appetite  Signs/Symptoms: severe muscle depletion, severe fat depletion   Severe right hip pain, recurrent mechanical fall, inability to ambulate, recent L3 kyphoplasty on 01/17/2021: Continue analgesics as needed for pain.  Case discussed with Dr. Jolee Ewing, NP with palliative care and radiation oncologist, Dr. Eulas Post, via secure chat.  MRI right hip  has been reviewed.    (Overall showed progression of metastatic disease in the left and right iliac crest and S1 vertebral body.  He also showed muscle edema in the iliac [place] muscle bilaterally, right psoas muscle and bilateral abductor muscles.  It also showed small partial tear of the periphery of the right gluteus minimus musculotendinous junction).    Whole-body bone scan was without any significant new abnormality. Palliative care was also consulted for pain management. -Continue current regimen. -Most likely will get outpatient palliative radiation-received 1 dose today.  Stage IV non-small cell lung cancer with bony metastasis: Continue radiation therapy as prescribed by radiation oncologist. Outpatient follow-up with oncologist.  Anemia of chronic disease and chronic hyponatremia: Hemoglobin and sodium level are stable.  Lipitor had been discontinued because of myalgia.  Other comorbidities include CAD, hyperlipidemia, vitamin B12 deficiency, GERD.  He he was able to walk with PT-Home health established with [place].   Diet Order             Diet regular Room service appropriate? Yes; Fluid consistency: Thin  Diet effective now                  Consultants: Oncologist Palliative care  Procedures: None  Spoke for Medications:    acetaminophen  1,000 mg Oral TID   aspirin EC  81 mg Oral Daily   dexamethasone  4 mg Oral Q12H   DULoxetine  30 mg Oral Daily   enoxaparin (LOVENOX) injection  40 mg Subcutaneous Q24H   ezetimibe  10 mg Oral Daily   feeding supplement  237 mL Oral TID BM  fentaNYL  1 patch Transdermal Q72H   gabapentin  600 mg Oral TID   icosapent Ethyl  2 g Oral BID   insulin aspart  0-15 Units Subcutaneous TID WC   insulin aspart  0-5 Units Subcutaneous QHS   isosorbide mononitrate  30 mg Oral Daily   methocarbamol  750 mg Oral QID   metoprolol tartrate  25 mg Oral BID   multivitamin with minerals  1 tablet Oral Daily   polyethylene glycol  17  g Oral Daily   senna-docusate  2 tablet Oral BID   sucralfate  0.5 g Oral TID AC   vitamin B-12  1,000 mcg Oral Daily   Continuous Infusions:   Family Communication/Anticipated D/C date and plan/Code Status   DVT prophylaxis: enoxaparin (LOVENOX) injection 40 mg Start: 01/22/21 0800     Code Status: DNR  Family Communication: Discussed with wife at bedside. Disposition Plan:    Status is: Inpatient  Remains inpatient appropriate because:Ongoing active pain requiring inpatient pain management  Dispo: The patient is from: Home              Anticipated d/c is to: Home              Patient currently is medically stable.   Difficult to place patient No   Objective:    Vitals:   02/03/21 0332 02/03/21 0755 02/03/21 1119 02/03/21 1505  BP: 126/64 (!) 152/74 113/65 110/61  Pulse: 68 74 81 78  Resp: 16 15 16 16   Temp: 98.1 F (36.7 C) 98.2 F (36.8 C) 98 F (36.7 C) 98 F (36.7 C)  TempSrc: Oral     SpO2: 98% 100% 99% 99%  Weight:      Height:       No data found.   Intake/Output Summary (Last 24 hours) at 02/03/2021 1621 Last data filed at 02/03/2021 1400 Gross per 24 hour  Intake 600 ml  Output 1500 ml  Net -900 ml     Filed Weights   01/31/21 0500 02/01/21 0500 02/02/21 0500  Weight: 51.4 kg 51.4 kg 51.7 kg    Exam:  General.  Frail elderly man, in no acute distress. Pulmonary.  Lungs clear bilaterally, normal respiratory effort. CV.  Regular rate and rhythm, no JVD, rub or murmur. Abdomen.  Soft, nontender, nondistended, BS positive. CNS.  Alert and oriented .  No focal neurologic deficit. Extremities.  No edema, no cyanosis, pulses intact and symmetrical. Psychiatry.  Judgment and insight appears normal.    Data Reviewed:   I have personally reviewed following labs and imaging studies:  Labs: Labs show the following:   Basic Metabolic Panel: Recent Labs  Lab 01/29/21 0537  NA 133*  K 4.5  CL 95*  CO2 30  GLUCOSE 145*  BUN 17   CREATININE 0.54*  CALCIUM 8.4*    GFR Estimated Creatinine Clearance: 61.9 mL/min (A) (by C-G formula based on SCr of 0.54 mg/dL (L)). Liver Function Tests: No results for input(s): AST, ALT, ALKPHOS, BILITOT, PROT, ALBUMIN in the last 168 hours.  No results for input(s): LIPASE, AMYLASE in the last 168 hours. No results for input(s): AMMONIA in the last 168 hours. Coagulation profile No results for input(s): INR, PROTIME in the last 168 hours.  CBC: Recent Labs  Lab 01/29/21 0537  WBC 7.8  HGB 8.2*  HCT 25.0*  MCV 91.9  PLT 270    Cardiac Enzymes: No results for input(s): CKTOTAL, CKMB, CKMBINDEX, TROPONINI in the last 168 hours.  BNP (last 3 results) No results for input(s): PROBNP in the last 8760 hours. CBG: Recent Labs  Lab 02/01/21 2054 02/02/21 1112 02/02/21 2115 02/03/21 0756 02/03/21 1118  GLUCAP 254* 245* 311* 189* 197*    D-Dimer: No results for input(s): DDIMER in the last 72 hours. Hgb A1c: No results for input(s): HGBA1C in the last 72 hours. Lipid Profile: No results for input(s): CHOL, HDL, LDLCALC, TRIG, CHOLHDL, LDLDIRECT in the last 72 hours.  Thyroid function studies: No results for input(s): TSH, T4TOTAL, T3FREE, THYROIDAB in the last 72 hours.  Invalid input(s): FREET3 Anemia work up: No results for input(s): VITAMINB12, FOLATE, FERRITIN, TIBC, IRON, RETICCTPCT in the last 72 hours. Sepsis Labs: Recent Labs  Lab 01/29/21 0537  WBC 7.8     Microbiology No results found for this or any previous visit (from the past 240 hour(s)).   Procedures and diagnostic studies:  NM Bone Scan Whole Body  Result Date: 02/02/2021 CLINICAL DATA:  Lung cancer, osseous metastatic disease, staging examination EXAM: NUCLEAR MEDICINE WHOLE BODY BONE SCAN TECHNIQUE: Whole body anterior and posterior images were obtained approximately 3 hours after intravenous injection of radiopharmaceutical. RADIOPHARMACEUTICALS:  21.7 mCi Technetium-81m MDP IV  COMPARISON:  PET CT 12/12/2020 FINDINGS: There is a mixed lesion is seen within the L3 vertebral body corresponding to the pathologic fracture better seen on prior PET CT examination and superimposed methylmethacrylate related to vertebral augmentation noted on PET CT CT examination of 01/21/2021. The known metastasis within the left iliac crest represents a a cold lesion with surrounding osteoblastic activity. Focal uptake within the a mid body of the sternum is suspicious for an osseous metastasis in this location. Normal uptake and excretion within uptake within the sternoclavicular joints, shoulders, cervicothoracic spine are likely degenerative in nature. Normal soft tissue distribution. The kidneys and bladder. IMPRESSION: Uptake related to known pathologic fracture within the L3 vertebral body with probable postprocedural changes of vertebral augmentation and left iliac crest metastasis. 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 This could be better assessed with dedicated CT examination of the chest. Electronically Signed   By: Fidela Salisbury M.D.   On: 02/02/2021 15:22      LOS: 11 days   [facility]   Pager on [email or web address]. If 7PM-7AM, please contact night-coverage at [email or web address]     02/03/2021, 4:21 PM

## 2021-02-03 NOTE — Plan of Care (Signed)

## 2021-02-03 NOTE — TOC Progression Note (Signed)
Transition of Care [facility]) - Progression Note    Patient Details  Name: Donald Lowery MRN: [id number] Date of Birth: 1951-02-19  Transition of Care [facility]) CM/SW [place], RN Phone Number: 02/03/2021, 2:33 PM  Clinical Narrative:   with the patient's permission I returned the call from [facility] the patient's daughter in law, She stated that she spoke with Sunday Spillers the patient's wife, DC plan is for tomorrow, Explained that the patient will continue and go home with [facility], He completed the stairs with PT.           Expected Discharge Plan and Services                                                 Social Determinants of Health (SDOH) Interventions    Readmission Risk Interventions Readmission Risk Prevention Plan 01/15/2021  Transportation Screening Complete  PCP or Specialist Appt within 3-5 Days Complete  Social Work Consult for [place] Planning/Counseling Complete  Palliative Care Screening Not Applicable  Medication Review Press photographer) Complete  Some recent data might be hidden

## 2021-02-03 NOTE — Progress Notes (Signed)
Physical Therapy Treatment Patient Details Name: Donald Lowery MRN: [id number] DOB: 1950-07-06 Today's Date: 02/03/2021   History of Present Illness Pt is a 70 y/o M with PMH: stage IV non-small cell carcinoma of left upper lung (10/24/20) with bone metastasis, lytic lesion in his left iliac crest post chemotherapy and radiation, weight loss, chronic hyponatremia, HTN, previous NSTEMI, HLD, former tobacco user (quit in 2020), who presented to [facility] ED from home due to R hip pain after 2 falls at home environment after recent D/c from hospital stay.    PT Comments    Pt was supine in bed upon arriving. He agrees to PT session with encouragement and was cooperative throughout. Easily able to exit bed, stand, and ambulate with RW. Performed ascending/descending 5 stair with BUE support on rails without difficulty or safety concern. MD planning to Dc pt home today with HHPT to follow.  He was in bed at conclusion of session with call bell in reach, RN at bedside, and spouse present.   Recommendations for follow up therapy are one component of a multi-disciplinary discharge planning process, led by the attending physician.  Recommendations may be updated based on patient status, additional functional criteria and insurance authorization.  Follow Up Recommendations  Home health PT;Supervision/Assistance - 24 hour;Supervision for mobility/OOB     Equipment Recommendations  None recommended by PT       Precautions / Restrictions Precautions Precautions: Fall Restrictions Weight Bearing Restrictions: No     Mobility  Bed Mobility Overal bed mobility: Needs Assistance Bed Mobility: Supine to Sit;Sit to Supine   Sidelying to sit: Supervision Supine to sit: Supervision Sit to supine: Supervision   General bed mobility comments: no physical assistance required    Transfers Overall transfer level: Needs assistance Equipment used: Rolling walker (2 wheeled) Transfers: Sit to/from  Stand Sit to Stand: Supervision         General transfer comment: no physical assistance required.  Ambulation/Gait Ambulation/Gait assistance: Supervision Gait Distance (Feet): 50 Feet Assistive device: Rolling walker (2 wheeled) Gait Pattern/deviations: Step-through pattern Gait velocity: decreased   General Gait Details: pt ambulated 50 ft with RW without LOB or safety concerns   Stairs Stairs: Yes Stairs assistance: Min guard Stair Management: Two rails;Step to pattern;Forwards Number of Stairs: 5 General stair comments: pt was easily able to ascend/descend 5 stair with BUE on two rails    Balance Overall balance assessment: Modified Independent Sitting-balance support: Feet supported Sitting balance-Leahy Scale: Good     Standing balance support: Bilateral upper extremity supported Standing balance-Leahy Scale: Good Standing balance comment: no LOB with BUE support      Cognition Arousal/Alertness: Awake/alert Behavior During Therapy: WFL for tasks assessed/performed Overall Cognitive Status: Within Functional Limits for tasks assessed      General Comments: Pt was alert and cooperative today with encouragament and increased time  to perform desired task             Pertinent Vitals/Pain Pain Assessment: 0-10 Pain Score: 9  Faces Pain Scale: Hurts whole lot Pain Location: R hip pain Pain Descriptors / Indicators: Constant Pain Intervention(s): Limited activity within patient's tolerance;Monitored during session;Premedicated before session;Repositioned     PT Goals (current goals can now be found in the care plan section) Acute Rehab PT Goals Patient Stated Goal: less pain Progress towards PT goals: Progressing toward goals    Frequency    Min 2X/week      PT Plan Current plan remains appropriate  AM-PAC PT "6 Clicks" Mobility   Outcome Measure  Help needed turning from your back to your side while in a flat bed without using  bedrails?: None Help needed moving from lying on your back to sitting on the side of a flat bed without using bedrails?: None Help needed moving to and from a bed to a chair (including a wheelchair)?: A Little Help needed standing up from a chair using your arms (e.g., wheelchair or bedside chair)?: A Little Help needed to walk in hospital room?: A Little Help needed climbing 3-5 steps with a railing? : A Little 6 Click Score: 20    End of Session Equipment Utilized During Treatment: Gait belt Activity Tolerance: Patient tolerated treatment well Patient left: in bed;with call bell/phone within reach;with bed alarm set;with nursing/sitter in room;with family/visitor present Nurse Communication: Mobility status PT Visit Diagnosis: Unsteadiness on feet (R26.81);Other abnormalities of gait and mobility (R26.89);Muscle weakness (generalized) (M62.81);Difficulty in walking, not elsewhere classified (R26.2);Pain Pain - Right/Left: Right Pain - part of body: Hip     Time: 1137-1200 PT Time Calculation (min) (ACUTE ONLY): 23 min  Charges:  $Gait Training: 8-22 mins $Therapeutic Activity: 8-22 mins                    Julaine Fusi PTA 02/03/21, 12:12 PM

## 2021-02-03 NOTE — Progress Notes (Signed)
[place]  Telephone:([phone] [phone]   Name: Donald Lowery Date: 02/03/2021 MRN: [id number]  DOB: 10/11/1950  Patient Care Team: [name], NP as PCP - General (Internal Medicine) [name], MD as PCP - Cardiology (Cardiology) Telford Nab, RN as Oncology Nurse Navigator    REASON FOR CONSULTATION: Donald Lowery is a 70 y.o. 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  Patient was recently hospitalized 01/12/2021 to 01/19/2021 with intractable back pain.  He was found to have a new compression fracture of L3 and underwent kyphoplasty.  Patient was readmitted 01/21/2021 with right hip pain.  He was started on XRT to the lumbar spine with pain felt to be referred from his known spinal metastatic disease.  Palliative care was consulted to help with pain management.[name]    CODE STATUS: DNR  PAST MEDICAL HISTORY: Past Medical History:  Diagnosis Date   Aortic atherosclerosis ([place])    [place] hernia 09/21/2020   fatty   Coronary artery disease    a. 04/2018 NSTEMI/Cath: LM nl, LAD 50p, 59m/d diffuse-small caliber, LCX heavily Ca2+ sev prox/mid dzs, RCA 90 diff distal dzs into RPL and RPDA. EF 50-55%-->Med Rx.   Hepatic steatosis    History of 2019 novel coronavirus disease (COVID-19) 10/12/2020   History of echocardiogram    a. 04/2018 Echo: EF 55-60%, no rwma, mild MR, mildly dil LA. Nl RV fxn.   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 Quit 04/2018.    PAST SURGICAL HISTORY:  Past Surgical History:   Procedure Laterality Date   BRAIN SURGERY     CARDIAC CATHETERIZATION     IR IMAGING GUIDED PORT INSERTION  10/28/2020   KYPHOPLASTY N/A 01/17/2021   Procedure: Claybon Jabs RFA;  Surgeon: Hessie Knows, MD;  Location: [facility];  Service: Orthopedics;  Laterality: N/A;   LEFT HEART CATH AND CORONARY ANGIOGRAPHY N/A 05/16/2018   Procedure: LEFT HEART CATH AND CORONARY ANGIOGRAPHY poss PCI;  Surgeon: [name], MD;  Location: [facility];  Service: Cardiovascular;  Laterality: N/A;   VIDEO BRONCHOSCOPY WITH ENDOBRONCHIAL NAVIGATION N/A 10/24/2020   Procedure: ROBOTIC ASSISTED VIDEO BRONCHOSCOPY WITH ENDOBRONCHIAL NAVIGATION;  Surgeon: Tyler Pita, MD;  Location: [facility];  Service: Pulmonary;  Laterality: N/A;    HEMATOLOGY/ONCOLOGY HISTORY:  Oncology History  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[place])  09/21/2020 Initial Diagnosis   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[place])   10/28/2020 Cancer Staging   Staging form: Lung, AJCC 8th Edition - Clinical stage from 10/28/2020: Stage IVA (cT4, cN0, pM1a) - Signed by Lloyd Huger, MD on 10/28/2020   11/03/2020 - 01/11/2021 Chemotherapy         02/01/2021 -  Chemotherapy    Patient is on Treatment Plan: LUNG NSCLC FLAT DOSE PEMBROLIZUMAB Q21D        ALLERGIES:  has No Known Allergies.  MEDICATIONS:  Current Facility-Administered Medications  Medication Dose Route Frequency Provider Last Rate Last Admin   acetaminophen (TYLENOL) tablet 650 mg  650 mg Oral Q6H PRN  Mansy, Jan A, MD       Or   acetaminophen (TYLENOL) suppository 650 mg  650 mg Rectal Q6H PRN Mansy, Jan A, MD       acetaminophen (TYLENOL) tablet 1,000 mg  1,000 mg Oral TID [name], MD   1,000 mg at 02/03/21 0813   ALPRAZolam Duanne Moron) tablet 0.25 mg  0.25 mg Oral BID PRN Lloyd Huger, MD   0.25 mg at 02/01/21 2201   aspirin EC tablet 81 mg  81 mg Oral Daily Mansy, Jan A, MD   81 mg at 02/03/21 0813   bisacodyl (DULCOLAX) suppository 10  mg  10 mg Rectal Daily PRN [name], MD   10 mg at 01/24/21 1441   dexamethasone (DECADRON) tablet 4 mg  4 mg Oral Q12H Kutler Vanvranken, [name], NP   4 mg at 02/03/21 2671   dicyclomine (BENTYL) capsule 10 mg  10 mg Oral TID PRN Mansy, Jan A, MD       DULoxetine (CYMBALTA) DR capsule 30 mg  30 mg Oral Daily Trayton Szabo, [name], NP   30 mg at 02/03/21 0813   enoxaparin (LOVENOX) injection 40 mg  40 mg Subcutaneous Q24H Mansy, Jan A, MD   40 mg at 02/03/21 2458   ezetimibe (ZETIA) tablet 10 mg  10 mg Oral Daily Mansy, Jan A, MD   10 mg at 02/03/21 0813   feeding supplement (ENSURE ENLIVE / ENSURE PLUS) liquid 237 mL  237 mL Oral TID BM Jennye Boroughs, MD   237 mL at 02/02/21 2135   fentaNYL (DURAGESIC) 100 MCG/HR 1 patch  1 patch Transdermal Q72H Ameria Sanjurjo, [name], NP   1 patch at 02/03/21 0304   gabapentin (NEURONTIN) tablet 600 mg  600 mg Oral TID [name], MD   600 mg at 02/03/21 0814   HYDROmorphone (DILAUDID) injection 2 mg  2 mg Intravenous K9X PRN Pershing Proud, NP   2 mg at 02/02/21 1204   HYDROmorphone (DILAUDID) tablet 4-6 mg  4-6 mg Oral Q4H PRN Maryiah Olvey, [name], NP   6 mg at 02/03/21 8338   icosapent Ethyl (VASCEPA) 1 g capsule 2 g  2 g Oral BID Mansy, Jan A, MD   2 g at 02/03/21 2505   isosorbide mononitrate (IMDUR) 24 hr tablet 30 mg  30 mg Oral Daily Mansy, Jan A, MD   30 mg at 02/03/21 3976   magnesium hydroxide (MILK OF MAGNESIA) suspension 30 mL  30 mL Oral Daily PRN Mansy, Jan A, MD       methocarbamol (ROBAXIN) tablet 750 mg  750 mg Oral QID Priscella Mann, Sudheer B, MD   750 mg at 02/03/21 0813   metoprolol tartrate (LOPRESSOR) tablet 25 mg  25 mg Oral BID Mansy, Jan A, MD   25 mg at 02/03/21 7341   multivitamin with minerals tablet 1 tablet  1 tablet Oral Daily [name], MD   1 tablet at 02/03/21 0813   Muscle Rub CREA 1 application  1 application Topical Daily PRN Mansy, Jan A, MD       naloxone [facility]) injection 0.4 mg  0.4 mg Intravenous PRN Mansy,  Jan A, MD       ondansetron [facility]) tablet 4 mg  4 mg Oral Q6H PRN Mansy, Jan A, MD       Or   ondansetron [facility]) injection 4 mg  4 mg Intravenous Q6H PRN Mansy, Jan A, MD   4 mg at 02/01/21  0755   polyethylene glycol (MIRALAX / GLYCOLAX) packet 17 g  17 g Oral Daily [name], MD   17 g at 02/03/21 6503   senna-docusate (Senokot-S) tablet 2 tablet  2 tablet Oral BID Jennye Boroughs, MD   2 tablet at 02/03/21 0813   sucralfate (CARAFATE) tablet 0.5 g  0.5 g Oral TID [facility] Mansy, Jan A, MD   0.5 g at 02/03/21 0813   traZODone (DESYREL) tablet 25 mg  25 mg Oral QHS PRN Mansy, Jan A, MD   25 mg at 02/02/21 2132   vitamin B-12 (CYANOCOBALAMIN) tablet 1,000 mcg  1,000 mcg Oral Daily Mansy, Jan A, MD   1,000 mcg at 02/03/21 0813    VITAL SIGNS: BP (!) 152/74 (BP Location: Right Arm)   Pulse 74   Temp 98.2 F (36.8 C)   Resp 15   Ht 5' 4.02" (1.626 m)   Wt 113 lb 15.7 oz (51.7 kg)   SpO2 100%   BMI 19.55 kg/m  Filed Weights   01/31/21 0500 02/01/21 0500 02/02/21 0500  Weight: 113 lb 5.1 oz (51.4 kg) 113 lb 5.1 oz (51.4 kg) 113 lb 15.7 oz (51.7 kg)    Estimated body mass index is 19.55 kg/m as calculated from the following:   Height as of this encounter: 5' 4.02" (1.626 m).   Weight as of this encounter: 113 lb 15.7 oz (51.7 kg).  LABS: CBC:    Component Value Date/Time   WBC 7.8 01/29/2021 0537   HGB 8.2 (L) 01/29/2021 0537   HCT 25.0 (L) 01/29/2021 0537   PLT 270 01/29/2021 0537   MCV 91.9 01/29/2021 0537   NEUTROABS 5.7 01/21/2021 2001   LYMPHSABS 0.4 (L) 01/21/2021 2001   MONOABS 0.4 01/21/2021 2001   EOSABS 0.0 01/21/2021 2001   BASOSABS 0.0 01/21/2021 2001   Comprehensive Metabolic Panel:    Component Value Date/Time   NA 133 (L) 01/29/2021 0537   NA 138 12/16/2018 0933   K 4.5 01/29/2021 0537   CL 95 (L) 01/29/2021 0537   CO2 30 01/29/2021 0537   BUN 17 01/29/2021 0537   BUN 14 12/16/2018 0933   CREATININE 0.54 (L) 01/29/2021 0537   GLUCOSE 145 (H)  01/29/2021 0537   CALCIUM 8.4 (L) 01/29/2021 0537   AST 23 01/21/2021 2001   ALT 18 01/21/2021 2001   ALKPHOS 116 01/21/2021 2001   BILITOT 1.0 01/21/2021 2001   PROT 6.2 (L) 01/21/2021 2001   ALBUMIN 2.5 (L) 01/21/2021 2001    RADIOGRAPHIC STUDIES: DG Lumbar Spine 2-3 Views  Result Date: 01/17/2021 CLINICAL DATA:  Kyphoplasty. EXAM: DG C-ARM 1-60 MIN; LUMBAR SPINE - 2-3 VIEW FLUOROSCOPY TIME:  Fluoroscopy Time:  2 minutes and 2 seconds. Number of Acquired Spot Images: 7 COMPARISON:  MRI lumbar spine 01/13/2021. FINDINGS: Seven C-arm fluoroscopic images were obtained intraoperatively and submitted for post operative interpretation. These images demonstrate kyphoplasty of the L3 vertebral body with slight lateral extravasation. Please see the performing provider's procedural report for further detail. IMPRESSION: Intraoperative fluoroscopy, as detailed above. Electronically Signed   By: Margaretha Sheffield M.D.   On: 01/17/2021 17:01   DG Lumbar Spine 2-3 Views  Result Date: 01/12/2021 CLINICAL DATA:  Back pain, lung cancer, evaluate for metastases EXAM: LUMBAR SPINE - 2-3 VIEW COMPARISON:  01/03/2021 FINDINGS: Mild superior endplate compression fracture deformity at L3, with 15% loss of height, new from the prior. This reflects a pathologic fracture. Mild degenerative changes of the visualized thoracolumbar spine. Visualized  bony pelvis appears intact. IMPRESSION: Mild superior endplate compression fracture deformity at L3, with 15% loss of height, new from the prior. This reflects a pathologic fracture. Electronically Signed   By: Julian Hy M.D.   On: 01/12/2021 20:04   CT Lumbar Spine Wo Contrast  Result Date: 01/21/2021 CLINICAL DATA:  Initial evaluation for worsening back pain, right hip pain, recent surgery. EXAM: CT LUMBAR SPINE WITHOUT CONTRAST TECHNIQUE: Multidetector CT imaging of the lumbar spine was performed without intravenous contrast administration. Multiplanar CT image  reconstructions were also generated. COMPARISON:  MRI from 01/13/2021. FINDINGS: Segmentation: Standard. Lowest well-formed disc space labeled the L5-S1 level. Alignment: Mild sigmoid scoliotic curvature of the thoracolumbar spine. 3 mm retrolisthesis of L1 on L2. Vertebrae: There has been interval performance of vertebral augmentation for previously identified pathologic fracture at L3. No visible complicating features. Associated height loss is stable without significant interval collapse. Posterior convex bowing of the L3 vertebral body with resultant moderate spinal stenosis is grossly stable. Extraosseous extension of tumor into the adjacent right psoas musculature also grossly similar. Vertebral body height otherwise maintained with no other acute or interval fracture. Visualized sacrum intact. SI joints symmetric and normal. There is an additional lytic metastatic lesion involving the left iliac wing (series 7, image 45). No other visible osseous metastatic disease. Paraspinal and other soft tissues: Extraosseous extension of tumor into the right psoas musculature related to the L3 metastasis, grossly stable from prior MRI. Paraspinous soft tissues demonstrate no other acute finding. Advanced aorto bi-iliac atherosclerotic disease. Disc levels: L1-2: Trace retrolisthesis with mild disc bulge. No spinal stenosis. Foramina remain patent. L2-3: Mild disc bulge with facet hypertrophy. No spinal stenosis. Foramina remain patent. L3-4: Degenerative intervertebral disc space narrowing with diffuse disc bulge. Mild facet hypertrophy. Moderate to advanced spinal stenosis related to posterior convex bowing related to the pathologic L3 compression fracture noted, grossly similar. Moderate to severe right with mild left L3 foraminal stenosis, grossly stable. L4-5: Mild disc bulge with facet hypertrophy. No spinal stenosis. No more than mild bilateral L4 foraminal narrowing. L5-S1: Mild disc bulge. Right greater than left  facet arthrosis. No spinal stenosis. Foramina remain patent. IMPRESSION: 1. Interval performance of vertebral augmentation for previously identified pathologic fracture at L3. No visible complication. Associated height loss is stable without further interval collapse. Posterior convex bowing of the L3 vertebral body with resultant moderate to advanced spinal stenosis, similar to previous. Moderate to severe right L3 foraminal stenosis also grossly stable. 2. Additional lytic metastatic lesion involving the left iliac wing. 3. No other acute abnormality within the lumbar spine. 4. Aortic Atherosclerosis (ICD10-I70.0). Electronically Signed   By: Jeannine Boga M.D.   On: 01/21/2021 20:40   MR Lumbar Spine W Wo Contrast  Result Date: 01/13/2021 CLINICAL DATA:  Spine fracture, lumbosacral, pathological new pathologic compression fracture lumbar spine EXAM: MRI LUMBAR SPINE WITHOUT AND WITH CONTRAST TECHNIQUE: Multiplanar and multiecho pulse sequences of the lumbar spine were obtained without and with intravenous contrast. CONTRAST:  68mL GADAVIST GADOBUTROL 1 MMOL/ML IV SOLN COMPARISON:  PET-CT 12/12/2020.  Lumbar radiographs 01/12/2021. FINDINGS: Segmentation: Standard segmentation is assumed. The inferior-most fully formed intervertebral disc is labeled L5-S1. Alignment:  Substantial sagittal subluxation.  Levocurvature. Vertebrae: Abnormal enhancement, T1 hypointensity and STIR hyperintensity involving the entire L3 vertebral body and bilateral L3 pedicles, compatible with metastasis. Pathologic fracture with 50% vertebral body height loss. Extraosseous extension of tumor with right anterior paraspinal soft tissue mass measuring 4.2 cm with surrounding edema/enhancement. There also is  posterior epidural extension of enhancing tumor into the canal and right foramen with associated severe canal stenosis and moderate to severe right foraminal stenosis. Conus medullaris and cauda equina: Conus extends to the L1  level. Conus appears normal Paraspinal and other soft tissues: Right anterolateral paraspinal soft tissue mass at L3 with extensive surrounding edema and enhancement, as detailed above. Disc levels: T12-L1: No significant disc protrusion, foraminal stenosis, or canal stenosis. L1-L2: Mild disc bulging without significant canal or foraminal stenosis. L2-L3: Mild disc bulging without significant canal or foraminal stenosis. L3-L4: Pathologic fracture at L3 with posterior epidural extension of tumor into the canal and right foramen, as described above. Resulting severe canal stenosis and moderate to severe right foraminal stenosis. L4-L5: Small broad disc bulge and bilateral facet hypertrophy with mild right foraminal stenosis. No significant canal or left foraminal stenosis. L5-S1: Facet hypertrophy without significant canal or foraminal stenosis. Other: Please see MRI of the pelvis for evaluation of the pelvis/sacrum. IMPRESSION: 1. Osseous metastatic disease at L3 with pathologic fracture (50% height loss) and posterior epidural extension of tumor into the canal and right foramen. Resulting severe canal stenosis and moderate to severe right foraminal stenosis. Associated right anterolateral 4.2 cm paraspinal soft tissue mass at L3 with surrounding inflammation/edema. 2. No evidence of osseous metastatic disease in the other lumbar vertebral bodies. Please see concurrent MRI of the pelvis for evaluation of the pelvis/sacrum. Electronically Signed   By: Margaretha Sheffield M.D.   On: 01/13/2021 12:13   MR PELVIS W WO CONTRAST  Result Date: 01/13/2021 CLINICAL DATA:  70 y.o. male with medical history significant for stage IV non-small cell carcinoma of left upper lung (10/24/20) with bone metastasis, lytic lesion in his left iliac crest postchemotherapy and radiation, weight loss EXAM: MRI PELVIS WITHOUT AND WITH CONTRAST TECHNIQUE: Multiplanar multisequence MR imaging of the pelvis was performed both before and after  administration of intravenous contrast. CONTRAST:  79mL GADAVIST GADOBUTROL 1 MMOL/ML IV SOLN COMPARISON:  PET-CT 12/12/2020 FINDINGS: Bones: No hip fracture, dislocation or avascular necrosis. 3.3 x 3.8 cm soft tissue mass with bone destruction involving the left iliac crest with surrounding bone marrow edema. Mild edema in the adjacent iliacus muscle which may related to radiation changes. 12 mm focus of osseous metastatic disease in the right iliac crest without a soft tissue component. 5 mm osseous metastatic disease in the S1 vertebral body. Normal sacrum and sacroiliac joints. No SI joint widening or erosive changes. Articular cartilage and labrum Articular cartilage:  No chondral defect. Labrum: Grossly intact, but evaluation is limited by lack of intraarticular fluid. Joint or bursal effusion Joint effusion:  No hip joint effusion.  No SI joint effusion. Bursae:  No bursa formation. Muscles and tendons Flexors: Mild perifascial edema involving the iliacus muscles bilaterally. Extensors: Normal. Abductors: Normal. Adductors: Normal. Gluteals: Normal. Hamstrings: Normal. Other findings No pelvic free fluid. No fluid collection or hematoma. No inguinal lymphadenopathy. No inguinal hernia. IMPRESSION: 1. Osseous metastatic disease involving the left iliac crest with a large soft tissue component measuring 3.3 x 3.8 cm and surrounding bone marrow edema. Mild edema in the adjacent iliacus muscle which may related to radiation changes. 12 mm focus of osseous metastatic disease in the right iliac crest. 5 mm osseous metastatic disease in the S1 vertebral body. 2. No hip fracture, dislocation or avascular necrosis. Electronically Signed   By: Kathreen Devoid M.D.   On: 01/13/2021 13:18   NM Bone Scan Whole Body  Result Date: 02/02/2021 CLINICAL DATA:  Lung cancer, osseous metastatic disease, staging examination EXAM: NUCLEAR MEDICINE WHOLE BODY BONE SCAN TECHNIQUE: Whole body anterior and posterior images were  obtained approximately 3 hours after intravenous injection of radiopharmaceutical. RADIOPHARMACEUTICALS:  21.7 mCi Technetium-81m MDP IV COMPARISON:  PET CT 12/12/2020 FINDINGS: There is a mixed lesion is seen within the L3 vertebral body corresponding to the pathologic fracture better seen on prior PET CT examination and superimposed methylmethacrylate related to vertebral augmentation noted on PET CT CT examination of 01/21/2021. The known metastasis within the left iliac crest represents a a cold lesion with surrounding osteoblastic activity. Focal uptake within the a mid body of the sternum is suspicious for an osseous metastasis in this location. Normal uptake and excretion within uptake within the sternoclavicular joints, shoulders, cervicothoracic spine are likely degenerative in nature. Normal soft tissue distribution. The kidneys and bladder. IMPRESSION: Uptake related to known pathologic fracture within the L3 vertebral body with probable postprocedural changes of vertebral augmentation and left iliac crest metastasis. 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 This could be better assessed with dedicated CT examination of the chest. Electronically Signed   By: Fidela Salisbury M.D.   On: 02/02/2021 15:22   CT Hip Right Wo Contrast  Result Date: 01/21/2021 CLINICAL DATA:  Fall, right hip pain EXAM: CT OF THE RIGHT HIP WITHOUT CONTRAST TECHNIQUE: Multidetector CT imaging of the right hip was performed according to the standard protocol. Multiplanar CT image reconstructions were also generated. COMPARISON:  X-ray 01/21/2021 FINDINGS: Bones/Joint/Cartilage Right hip is intact without fracture or dislocation. No evidence of avascular necrosis of the femoral head. Right hip joint space is maintained. No hip joint effusion is evident. Visualized portion of the right hemipelvis is intact. Pubic symphysis and right sacroiliac joint intact without diastasis. No lytic or sclerotic bony lesions are identified. Site of known  right iliac crest metastatic lesion is not included within the field of view on the current study. Ligaments Suboptimally assessed by CT. Muscles and Tendons No acute musculotendinous abnormality by CT. Soft tissues Mild subcutaneous edema overlying the greater trochanter. No organized fluid collection or hematoma. No abnormally enlarged right inguinal lymph nodes. Bilateral hydroceles. Atherosclerotic vascular calcifications are present. IMPRESSION: 1. No acute fracture or dislocation of the right hip. 2. Bilateral hydroceles. Electronically Signed   By: Davina Poke D.O.   On: 01/21/2021 20:22   MR HIP RIGHT WO CONTRAST  Result Date: 02/01/2021 CLINICAL DATA:  Severe right hip pain.  History of lung cancer. EXAM: MR OF THE RIGHT HIP WITHOUT CONTRAST TECHNIQUE: Multiplanar, multisequence MR imaging was performed. No intravenous contrast was administered. COMPARISON:  01/13/2021 FINDINGS: Bones: No hip fracture, dislocation or avascular necrosis. Interval enlargement of a 4.4 x 4.5 cm soft tissue mass with bone destruction involving the left iliac crest with surrounding bone marrow edema. Mild edema in the adjacent iliacus muscle which may related to radiation changes. 20 mm osseous metastatic disease in the right iliac crest without a soft tissue component. 8 mm osseous metastatic disease in the S1 vertebral body. Normal sacrum and sacroiliac joints. No SI joint widening or erosive changes. Mild lower lumbar spine spondylosis. Articular cartilage and labrum Articular cartilage:  No chondral defect. Labrum: Grossly intact, but evaluation is limited by lack of intraarticular fluid. Joint or bursal effusion Joint effusion:  No hip joint effusion.  No SI joint effusion. Bursae:  No bursa formation. Muscles and tendons Flexors: Muscle edema in the iliacus muscle bilaterally and right psoas muscle which may reflect myositis versus muscle strain. Extensors: Normal.  Abductors: Normal. Adductors: Muscle edema in the  adductor musculature bilaterally concerning for mild muscle strain versus myositis. Gluteals: Small partial tear of the periphery of the right gluteus minimus musculotendinous junction. Hamstrings: Normal. Other findings No pelvic free fluid. No fluid collection or hematoma. No inguinal lymphadenopathy. No inguinal hernia. IMPRESSION: 1. Interval enlargement of the left iliac crest soft tissue mass currently measuring 4.4 x 4.5 cm. Interval enlargement of the right iliac crest osseous metastatic disease measuring 20 mm. Interval enlargement of the S1 vertebral body focus of osseous metastatic disease. Overall appearance is consistent with progression of metastatic disease. 2. No hip fracture, dislocation or avascular necrosis. 3. Muscle edema in the iliacus muscle bilaterally and right psoas muscle as well as bilateral adductor musculature. Differential considerations include mild muscle strain versus myositis secondary to radiation therapy. 4. Small partial tear of the periphery of the right gluteus minimus musculotendinous junction. Electronically Signed   By: Kathreen Devoid M.D.   On: 02/01/2021 07:15   DG C-Arm 1-60 Min  Result Date: 01/17/2021 CLINICAL DATA:  Kyphoplasty. EXAM: DG C-ARM 1-60 MIN; LUMBAR SPINE - 2-3 VIEW FLUOROSCOPY TIME:  Fluoroscopy Time:  2 minutes and 2 seconds. Number of Acquired Spot Images: 7 COMPARISON:  MRI lumbar spine 01/13/2021. FINDINGS: Seven C-arm fluoroscopic images were obtained intraoperatively and submitted for post operative interpretation. These images demonstrate kyphoplasty of the L3 vertebral body with slight lateral extravasation. Please see the performing provider's procedural report for further detail. IMPRESSION: Intraoperative fluoroscopy, as detailed above. Electronically Signed   By: Margaretha Sheffield M.D.   On: 01/17/2021 17:01   DG Hip Unilat  With Pelvis 2-3 Views Right  Result Date: 01/21/2021 CLINICAL DATA:  Right hip pain. History of metastatic  non-small cell lung cancer EXAM: DG HIP (WITH OR WITHOUT PELVIS) 2-3V RIGHT COMPARISON:  MRI pelvis 01/13/2021 FINDINGS: No acute fracture or dislocation. Right hip joint space is preserved. Known destructive lesion at the left iliac crest is not well seen, partially obscured by overlying stool and bowel gas. Small marrow replacing lesion of the right iliac crest seen on recent MRI is not well demonstrated radiographically. Otherwise, no evidence of a new destructive bone lesion. IMPRESSION: 1. No acute fracture or dislocation. 2. Known metastatic lesions within the bilateral iliac crests are not well seen radiographically. Electronically Signed   By: Davina Poke D.O.   On: 01/21/2021 18:25    PERFORMANCE STATUS (ECOG) : 3 - Symptomatic, >50% confined to bed  Review of Systems Unless otherwise noted, a complete review of systems is negative.  Physical Exam General: NAD Cardiovascular: regular rate and rhythm Pulmonary: clear ant fields Abdomen: soft, nontender, + bowel sounds GU: no suprapubic tenderness Extremities: no edema, no joint deformities Skin: no rashes Neurological: Weakness but otherwise nonfocal  IMPRESSION: Patient reports feeling "good" this morning.  He seems to be essentially at baseline regarding pain.  Encouraged patient to try to ambulate today.  Would recommend PT assessment at their earliest convenience in anticipation of possible discharge.  Wife says she is reluctant to take patient home before he demonstrates a return to functional baseline.  It does not sound like rehab could be an option given plan for continued XRT.  Otherwise, would recommend discharging him home on current pain regimen with plan for outpatient follow up next week in the [place].   PLAN: -Continue current scope of treatment -Continue fentanyl/hydromorphone/dexamethasone -Continue duloxetine 30 mg daily -PT/OT   Time Total: 15 minutes  Visit consisted of counseling  and education  dealing with the complex and emotionally intense issues of symptom management and palliative care in the setting of serious and potentially life-threatening illness.Greater than 50%  of this time was spent counseling and coordinating care related to the above assessment and plan.  Signed by: Altha Harm, PhD, NP-C

## 2021-02-03 NOTE — Progress Notes (Addendum)
Inpatient Diabetes Program Recommendations  AACE/ADA: New Consensus Statement on Inpatient Glycemic Control (2015)  Target Ranges:  Prepandial:   less than 140 mg/dL      Peak postprandial:   less than 180 mg/dL (1-2 hours)      Critically ill patients:  140 - 180 mg/dL   Results for Donald Lowery, Donald Lowery (MRN [id number]) as of 02/03/2021 10:20  Ref. Range 02/02/2021 11:12 02/02/2021 21:15 02/03/2021 07:56  Glucose-Capillary Latest Ref Range: 70 - 99 mg/dL 245 (H) 311 (H) 189 (H)   Admit with: Right hip pain status post recurrent mechanical falls with inability to ambulate.  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

## 2021-02-04 DIAGNOSIS — M25551 Pain in right hip: Secondary | ICD-10-CM | POA: Diagnosis not present

## 2021-02-04 DIAGNOSIS — E871 Hypo-osmolality and hyponatremia: Secondary | ICD-10-CM

## 2021-02-04 DIAGNOSIS — C3492 Malignant neoplasm of unspecified part of left bronchus or lung: Secondary | ICD-10-CM | POA: Diagnosis not present

## 2021-02-04 DIAGNOSIS — R52 Pain, unspecified: Secondary | ICD-10-CM | POA: Diagnosis not present

## 2021-02-04 LAB — GLUCOSE, CAPILLARY
Glucose-Capillary: 186 mg/dL — ABNORMAL HIGH (ref 70–99)
Glucose-Capillary: 288 mg/dL — ABNORMAL HIGH (ref 70–99)

## 2021-02-04 MED ORDER — DULOXETINE HCL 30 MG PO CPEP: 30.0000 mg | ORAL_CAPSULE | Freq: Every day | ORAL | 3 refills | Status: AC

## 2021-02-04 MED ORDER — METFORMIN HCL 500 MG PO TABS: 500.0000 mg | ORAL_TABLET | Freq: Two times a day (BID) | ORAL | 1 refills | Status: AC

## 2021-02-04 MED ORDER — ALPRAZOLAM 0.25 MG PO TABS: 0.2500 mg | ORAL_TABLET | Freq: Two times a day (BID) | ORAL | 0 refills | Status: DC | PRN

## 2021-02-04 MED ORDER — GABAPENTIN 600 MG PO TABS: 600.0000 mg | ORAL_TABLET | Freq: Three times a day (TID) | ORAL | 1 refills | Status: AC

## 2021-02-04 MED ORDER — METHOCARBAMOL 750 MG PO TABS: 750.0000 mg | ORAL_TABLET | Freq: Four times a day (QID) | ORAL | 1 refills | Status: AC

## 2021-02-04 MED ORDER — TRAZODONE HCL 50 MG PO TABS: 25.0000 mg | ORAL_TABLET | Freq: Every evening | ORAL | 0 refills | Status: AC | PRN

## 2021-02-04 MED ORDER — HYDROMORPHONE HCL 4 MG PO TABS: 4.0000 mg | ORAL_TABLET | ORAL | 0 refills | Status: DC | PRN

## 2021-02-04 MED ORDER — ACETAMINOPHEN 500 MG PO TABS: 1000.0000 mg | ORAL_TABLET | Freq: Three times a day (TID) | ORAL | 0 refills | Status: AC

## 2021-02-04 MED ORDER — POLYETHYLENE GLYCOL 3350 17 G PO PACK: 17.0000 g | PACK | Freq: Every day | ORAL | 0 refills | Status: AC

## 2021-02-04 MED ORDER — DEXAMETHASONE 4 MG PO TABS: 4.0000 mg | ORAL_TABLET | Freq: Two times a day (BID) | ORAL | 0 refills | Status: DC

## 2021-02-04 NOTE — Progress Notes (Signed)
Patient d/c to home.  D/C paperwork given and gone over with patient and wife.  No unanswered questions.  No hard scripts given.  IV removed. Tip intact. All belongings with patient.  Wife will transport via private vehicle.

## 2021-02-04 NOTE — Plan of Care (Signed)
  Problem: Education: Goal: Knowledge of General Education information will improve Description: Including pain rating scale, medication(s)/side effects and non-pharmacologic comfort measures 02/04/2021 1521 by Elsie Ra, RN Outcome: Completed/Met 02/04/2021 1131 by Qunisha Bryk, Debbe Mounts, RN Outcome: Progressing   Problem: Health Behavior/Discharge Planning: Goal: Ability to manage health-related needs will improve 02/04/2021 1521 by Jamielee Mchale, Debbe Mounts, RN Outcome: Completed/Met 02/04/2021 1131 by Elsie Ra, RN Outcome: Progressing   Problem: Clinical Measurements: Goal: Ability to maintain clinical measurements within normal limits will improve 02/04/2021 1521 by Latoshia Monrroy, Debbe Mounts, RN Outcome: Completed/Met 02/04/2021 1131 by Elsie Ra, RN Outcome: Progressing Goal: Will remain free from infection 02/04/2021 1521 by Elsie Ra, RN Outcome: Completed/Met 02/04/2021 1131 by Elsie Ra, RN Outcome: Progressing Goal: Diagnostic test results will improve 02/04/2021 1521 by Elsie Ra, RN Outcome: Completed/Met 02/04/2021 1131 by Elsie Ra, RN Outcome: Progressing Goal: Respiratory complications will improve 02/04/2021 1521 by Elsie Ra, RN Outcome: Completed/Met 02/04/2021 1131 by Elsie Ra, RN Outcome: Progressing Goal: Cardiovascular complication will be avoided 02/04/2021 1521 by Elsie Ra, RN Outcome: Completed/Met 02/04/2021 1131 by Nabria Nevin, Debbe Mounts, RN Outcome: Progressing   Problem: Activity: Goal: Risk for activity intolerance will decrease 02/04/2021 1521 by Sumeya Yontz, Debbe Mounts, RN Outcome: Completed/Met 02/04/2021 1131 by Elsie Ra, RN Outcome: Progressing   Problem: Nutrition: Goal: Adequate nutrition will be maintained 02/04/2021 1521 by Elsie Ra, RN Outcome: Completed/Met 02/04/2021 1131 by Elsie Ra, RN Outcome: Progressing   Problem: Coping: Goal: Level  of anxiety will decrease 02/04/2021 1521 by Elsie Ra, RN Outcome: Completed/Met 02/04/2021 1131 by Elsie Ra, RN Outcome: Progressing   Problem: Elimination: Goal: Will not experience complications related to bowel motility 02/04/2021 1521 by Elsie Ra, RN Outcome: Completed/Met 02/04/2021 1131 by Elsie Ra, RN Outcome: Progressing Goal: Will not experience complications related to urinary retention 02/04/2021 1521 by Elsie Ra, RN Outcome: Completed/Met 02/04/2021 1131 by Lesette Frary, Debbe Mounts, RN Outcome: Progressing   Problem: Pain Managment: Goal: General experience of comfort will improve 02/04/2021 1521 by Filippo Puls, Debbe Mounts, RN Outcome: Completed/Met 02/04/2021 1131 by Elsie Ra, RN Outcome: Progressing   Problem: Safety: Goal: Ability to remain free from injury will improve 02/04/2021 1521 by Kimari Lienhard, Debbe Mounts, RN Outcome: Completed/Met 02/04/2021 1131 by Elsie Ra, RN Outcome: Progressing   Problem: Skin Integrity: Goal: Risk for impaired skin integrity will decrease 02/04/2021 1521 by Elsie Ra, RN Outcome: Completed/Met 02/04/2021 1131 by Olivya Sobol, Debbe Mounts, RN Outcome: Progressing

## 2021-02-04 NOTE — TOC Transition Note (Signed)
Transition of Care [facility]) - CM/SW Discharge Note   Patient Details  Name: Donald Lowery MRN: [id number] Date of Birth: [birth year]  Transition of Care [facility]) CM/SW Contact:  Alberteen Sam, LCSW Phone Number: 02/04/2021, 12:02 PM   Clinical Narrative:     CSW notes patient to dc home today, is set up with [facility] PT OT and aide through [place]. Cory with [facility] informed of patient's discharge today. No other discharge needs identified at this time.     Final next level of care: [place] Barriers to Discharge: No Barriers Identified   Patient Goals and CMS Choice Patient states their goals for this hospitalization and ongoing recovery are:: to go home CMS Medicare.gov Compare Post Acute Care list provided to:: Patient Represenative (must comment) (daughter) Choice offered to / list presented to : Adult Children  Discharge Placement                    Patient and family notified of of transfer: 02/04/21  Discharge Plan and Services                          HH Arranged: PT, OT, Nurse's Aide [place] Agency: [facility] [place] Date [place]: 02/04/21 Time [place]: 1202 Representative spoke with at [place]: [place] ([place]) Interventions     Readmission Risk Interventions Readmission Risk Prevention Plan 01/15/2021  Transportation Screening Complete  PCP or Specialist Appt within 3-5 Days Complete  Social Work Consult for [place] Planning/Counseling [place] Not Applicable  Medication Review Press photographer) Complete  Some recent data might be hidden

## 2021-02-04 NOTE — Plan of Care (Signed)

## 2021-02-04 NOTE — Discharge Summary (Signed)
Physician Discharge Summary  Donald Lowery MBW:[id number] DOB: 1950-06-03 DOA: 01/21/2021  PCP: [name], NP  Admit date: 01/21/2021 Discharge date: 02/04/2021  Admitted From: Home Disposition: Home  Recommendations for Outpatient Follow-up:  Follow up with PCP in 1-2 weeks Follow-up with oncology [facility] with palliative care at cancer center Please obtain BMP/CBC in one week Please follow up on the following pending results: None  Home Health: Yes Equipment/Devices: Rolling walker Discharge Condition: Stable CODE STATUS: DNR Diet recommendation: Heart Healthy / Carb Modified   Brief/Interim Summary: Donald Lowery is a 70 y.o. 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  He presented to the hospital with acute right hip pain.   Right hip pain was attributed to metastatic disease from lung cancer.  He was treated with analgesics.  Palliative care team was consulted to assist with pain control.  He also received palliative radiation therapy.   Repeat bone scan was without any significant new abnormality. Pain bearable with current regimen and he will follow-up closely with palliative care at cancer center along with his oncologist for further management.  Patient also has anemia of chronic disease and chronic hyponatremia which remained stable.  Lipitor was discontinued for concern of myalgias.    Will continue the rest of his home medications and follow-up with his providers.  Discharge Diagnoses:  Active Problems:   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HCC)   Intractable pain   Right hip pain   Palliative care encounter   Multiple falls   Hyponatremia   Discharge  Instructions  Discharge Instructions     Diet - low sodium heart healthy   Complete by: As directed    Discharge instructions   Complete by: As directed    It was pleasure taking care of you. We made some changes to your pain medications, please take it as directed and follow-up closely with your oncologist and palliative care at cancer center for further recommendations. We also increased the dose of metformin from once daily to twice daily as you are not taking Decadron which can increase your blood sugar levels.  Please check your blood glucose regularly and follow-up closely with your primary care doctor for further recommendations.   Increase activity slowly   Complete by: As directed    No dressing needed   Complete by: As directed       Allergies as of 02/04/2021   No Known Allergies      Medication List     STOP taking these medications    atorvastatin 80 MG tablet Commonly known as: LIPITOR   gabapentin 100 MG capsule Commonly known as: NEURONTIN Replaced by: gabapentin 600 MG tablet   Oxycodone HCl 10 MG Tabs       TAKE these medications    acetaminophen 500 MG tablet Commonly known as: TYLENOL Take 2 tablets (1,000 mg total) by mouth 3 (three) times daily.   ALPRAZolam 0.25 MG tablet Commonly known as: XANAX Take 1 tablet (0.25 mg total) by mouth 2 (two) times daily as needed for anxiety.   aspirin 81 MG EC tablet Take 1 tablet (81 mg total) by mouth daily.   cyclobenzaprine 5 MG tablet Commonly known as: FLEXERIL Take 1 tablet (5 mg total) by mouth 3 (  three) times daily as needed for muscle spasms.   dexamethasone 4 MG tablet Commonly known as: DECADRON Take 1 tablet (4 mg total) by mouth every 12 (twelve) hours.   dicyclomine 10 MG capsule Commonly known as: Bentyl Take 1 capsule (10 mg total) by mouth 3 (three) times daily as needed for up to 14 days for spasms.   DULoxetine 30 MG capsule Commonly known as: CYMBALTA Take 1 capsule (30 mg  total) by mouth daily.   ezetimibe 10 MG tablet Commonly known as: ZETIA Take 1 tablet (10 mg total) by mouth daily.   feeding supplement Liqd Take 237 mLs by mouth 3 (three) times daily between meals.   fentaNYL 50 MCG/HR Commonly known as: [place] 1 patch onto the skin every 3 (three) days.   gabapentin 600 MG tablet Commonly known as: NEURONTIN Take 1 tablet (600 mg total) by mouth 3 (three) times daily. Replaces: gabapentin 100 MG capsule   HYDROmorphone 4 MG tablet Commonly known as: DILAUDID Take 1-1.5 tablets (4-6 mg total) by mouth every 4 (four) hours as needed for severe pain.   icosapent Ethyl 1 g capsule Commonly known as: Vascepa Take 2 capsules (2 g total) by mouth 2 (two) times daily.   Icy Hot 10-30 % Stck Apply 1 application topically daily as needed (shoulder pain).   isosorbide mononitrate 30 MG 24 hr tablet Commonly known as: IMDUR Take 1 tablet (30 mg total) by mouth daily.   metFORMIN 500 MG tablet Commonly known as: GLUCOPHAGE Take 1 tablet (500 mg total) by mouth 2 (two) times daily with a meal. What changed: when to take this   methocarbamol 750 MG tablet Commonly known as: ROBAXIN Take 1 tablet (750 mg total) by mouth 4 (four) times daily.   metoprolol tartrate 25 MG tablet Commonly known as: LOPRESSOR Take 1 tablet (25 mg total) by mouth 2 (two) times daily.   naloxone 2 MG/2ML injection Commonly known as: NARCAN Inject 0.4 mLs (0.4 mg total) into the vein as needed.   polyethylene glycol 17 g packet Commonly known as: MIRALAX / GLYCOLAX Take 17 g by mouth daily.   senna-docusate 8.6-50 MG tablet Commonly known as: Senokot-S Take 2 tablets by mouth at bedtime as needed for mild constipation.   sucralfate 1 g tablet Commonly known as: Carafate Take 0.5 tablets (0.5 g total) by mouth 3 (three) times daily before meals.   traZODone 50 MG tablet Commonly known as: DESYREL Take 0.5 tablets (25 mg total) by mouth at bedtime  as needed for sleep.   vitamin B-12 1000 MCG tablet Commonly known as: CYANOCOBALAMIN Take 1,000 mcg by mouth daily.               Discharge Care Instructions  (From admission, onward)           Start     Ordered   02/04/21 0000  No dressing needed        02/04/21 1040            Follow-up Information     Gauger, Victoriano Lain, NP. Schedule an appointment as soon as possible for a visit.   Specialty: Internal Medicine Contact information: [address] [place] [address] [phone]         [name], MD .   Specialty: Cardiology Contact information: [place] [address] [phone]                No Known  Allergies  Consultations: Palliative care Oncology  Procedures/Studies: DG Lumbar Spine 2-3 Views  Result Date: 01/17/2021 CLINICAL DATA:  Kyphoplasty. EXAM: DG C-ARM 1-60 MIN; LUMBAR SPINE - 2-3 VIEW FLUOROSCOPY TIME:  Fluoroscopy Time:  2 minutes and 2 seconds. Number of Acquired Spot Images: 7 COMPARISON:  MRI lumbar spine 01/13/2021. FINDINGS: Seven C-arm fluoroscopic images were obtained intraoperatively and submitted for post operative interpretation. These images demonstrate kyphoplasty of the L3 vertebral body with slight lateral extravasation. Please see the performing provider's procedural report for further detail. IMPRESSION: Intraoperative fluoroscopy, as detailed above. Electronically Signed   By: Margaretha Sheffield M.D.   On: 01/17/2021 17:01   DG Lumbar Spine 2-3 Views  Result Date: 01/12/2021 CLINICAL DATA:  Back pain, lung cancer, evaluate for metastases EXAM: LUMBAR SPINE - 2-3 VIEW COMPARISON:  01/03/2021 FINDINGS: Mild superior endplate compression fracture deformity at L3, with 15% loss of height, new from the prior. This reflects a pathologic fracture. Mild degenerative changes of the visualized thoracolumbar spine. Visualized bony pelvis appears intact. IMPRESSION: Mild superior  endplate compression fracture deformity at L3, with 15% loss of height, new from the prior. This reflects a pathologic fracture. Electronically Signed   By: Julian Hy M.D.   On: 01/12/2021 20:04   CT Lumbar Spine Wo Contrast  Result Date: 01/21/2021 CLINICAL DATA:  Initial evaluation for worsening back pain, right hip pain, recent surgery. EXAM: CT LUMBAR SPINE WITHOUT CONTRAST TECHNIQUE: Multidetector CT imaging of the lumbar spine was performed without intravenous contrast administration. Multiplanar CT image reconstructions were also generated. COMPARISON:  MRI from 01/13/2021. FINDINGS: Segmentation: Standard. Lowest well-formed disc space labeled the L5-S1 level. Alignment: Mild sigmoid scoliotic curvature of the thoracolumbar spine. 3 mm retrolisthesis of L1 on L2. Vertebrae: There has been interval performance of vertebral augmentation for previously identified pathologic fracture at L3. No visible complicating features. Associated height loss is stable without significant interval collapse. Posterior convex bowing of the L3 vertebral body with resultant moderate spinal stenosis is grossly stable. Extraosseous extension of tumor into the adjacent right psoas musculature also grossly similar. Vertebral body height otherwise maintained with no other acute or interval fracture. Visualized sacrum intact. SI joints symmetric and normal. There is an additional lytic metastatic lesion involving the left iliac wing (series 7, image 45). No other visible osseous metastatic disease. Paraspinal and other soft tissues: Extraosseous extension of tumor into the right psoas musculature related to the L3 metastasis, grossly stable from prior MRI. Paraspinous soft tissues demonstrate no other acute finding. Advanced aorto bi-iliac atherosclerotic disease. Disc levels: L1-2: Trace retrolisthesis with mild disc bulge. No spinal stenosis. Foramina remain patent. L2-3: Mild disc bulge with facet hypertrophy. No spinal  stenosis. Foramina remain patent. L3-4: Degenerative intervertebral disc space narrowing with diffuse disc bulge. Mild facet hypertrophy. Moderate to advanced spinal stenosis related to posterior convex bowing related to the pathologic L3 compression fracture noted, grossly similar. Moderate to severe right with mild left L3 foraminal stenosis, grossly stable. L4-5: Mild disc bulge with facet hypertrophy. No spinal stenosis. No more than mild bilateral L4 foraminal narrowing. L5-S1: Mild disc bulge. Right greater than left facet arthrosis. No spinal stenosis. Foramina remain patent. IMPRESSION: 1. Interval performance of vertebral augmentation for previously identified pathologic fracture at L3. No visible complication. Associated height loss is stable without further interval collapse. Posterior convex bowing of the L3 vertebral body with resultant moderate to advanced spinal stenosis, similar to previous. Moderate to severe right L3 foraminal stenosis also grossly stable. 2. Additional  lytic metastatic lesion involving the left iliac wing. 3. No other acute abnormality within the lumbar spine. 4. Aortic Atherosclerosis (ICD10-I70.0). Electronically Signed   By: Jeannine Boga M.D.   On: 01/21/2021 20:40   MR Lumbar Spine W Wo Contrast  Result Date: 01/13/2021 CLINICAL DATA:  Spine fracture, lumbosacral, pathological new pathologic compression fracture lumbar spine EXAM: MRI LUMBAR SPINE WITHOUT AND WITH CONTRAST TECHNIQUE: Multiplanar and multiecho pulse sequences of the lumbar spine were obtained without and with intravenous contrast. CONTRAST:  70mL GADAVIST GADOBUTROL 1 MMOL/ML IV SOLN COMPARISON:  PET-CT 12/12/2020.  Lumbar radiographs 01/12/2021. FINDINGS: Segmentation: Standard segmentation is assumed. The inferior-most fully formed intervertebral disc is labeled L5-S1. Alignment:  Substantial sagittal subluxation.  Levocurvature. Vertebrae: Abnormal enhancement, T1 hypointensity and STIR  hyperintensity involving the entire L3 vertebral body and bilateral L3 pedicles, compatible with metastasis. Pathologic fracture with 50% vertebral body height loss. Extraosseous extension of tumor with right anterior paraspinal soft tissue mass measuring 4.2 cm with surrounding edema/enhancement. There also is posterior epidural extension of enhancing tumor into the canal and right foramen with associated severe canal stenosis and moderate to severe right foraminal stenosis. Conus medullaris and cauda equina: Conus extends to the L1 level. Conus appears normal Paraspinal and other soft tissues: Right anterolateral paraspinal soft tissue mass at L3 with extensive surrounding edema and enhancement, as detailed above. Disc levels: T12-L1: No significant disc protrusion, foraminal stenosis, or canal stenosis. L1-L2: Mild disc bulging without significant canal or foraminal stenosis. L2-L3: Mild disc bulging without significant canal or foraminal stenosis. L3-L4: Pathologic fracture at L3 with posterior epidural extension of tumor into the canal and right foramen, as described above. Resulting severe canal stenosis and moderate to severe right foraminal stenosis. L4-L5: Small broad disc bulge and bilateral facet hypertrophy with mild right foraminal stenosis. No significant canal or left foraminal stenosis. L5-S1: Facet hypertrophy without significant canal or foraminal stenosis. Other: Please see MRI of the pelvis for evaluation of the pelvis/sacrum. IMPRESSION: 1. Osseous metastatic disease at L3 with pathologic fracture (50% height loss) and posterior epidural extension of tumor into the canal and right foramen. Resulting severe canal stenosis and moderate to severe right foraminal stenosis. Associated right anterolateral 4.2 cm paraspinal soft tissue mass at L3 with surrounding inflammation/edema. 2. No evidence of osseous metastatic disease in the other lumbar vertebral bodies. Please see concurrent MRI of the  pelvis for evaluation of the pelvis/sacrum. Electronically Signed   By: Margaretha Sheffield M.D.   On: 01/13/2021 12:13   MR PELVIS W WO CONTRAST  Result Date: 01/13/2021 CLINICAL DATA:  70 y.o. 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 CONTRAST:  81mL GADAVIST GADOBUTROL 1 MMOL/ML IV SOLN COMPARISON:  PET-CT 12/12/2020 FINDINGS: Bones: No hip fracture, dislocation or avascular necrosis. 3.3 x 3.8 cm soft tissue mass with bone destruction involving the left iliac crest with surrounding bone marrow edema. Mild edema in the adjacent iliacus muscle which may related to radiation changes. 12 mm focus of osseous metastatic disease in the right iliac crest without a soft tissue component. 5 mm osseous metastatic disease in the S1 vertebral body. Normal sacrum and sacroiliac joints. No SI joint widening or erosive changes. Articular cartilage and labrum Articular  cartilage:  No chondral defect. Labrum: Grossly intact, but evaluation is limited by lack of intraarticular fluid. Joint or bursal effusion Joint effusion:  No hip joint effusion.  No SI joint effusion. Bursae:  No bursa formation. Muscles and tendons Flexors: Mild perifascial edema involving the iliacus muscles bilaterally. Extensors: Normal. Abductors: Normal. Adductors: Normal. Gluteals: Normal. Hamstrings: Normal. Other findings No pelvic free fluid. No fluid collection or hematoma. No inguinal lymphadenopathy. No inguinal hernia. IMPRESSION: 1. Osseous metastatic disease involving the left iliac crest with a large soft tissue component measuring 3.3 x 3.8 cm and surrounding bone marrow edema. Mild edema in the  adjacent iliacus muscle which may related to radiation changes. 12 mm focus of osseous metastatic disease in the right iliac crest. 5 mm osseous metastatic disease in the S1 vertebral body. 2. No hip fracture, dislocation or avascular necrosis. Electronically Signed   By: Kathreen Devoid M.D.   On: 01/13/2021 13:18   NM Bone Scan Whole Body  Result Date: 02/02/2021 CLINICAL DATA:  Lung cancer, osseous metastatic disease, staging examination EXAM: NUCLEAR MEDICINE WHOLE BODY BONE SCAN TECHNIQUE: Whole body anterior and posterior images were obtained approximately 3 hours after intravenous injection of radiopharmaceutical. RADIOPHARMACEUTICALS:  21.7 mCi Technetium-16m MDP IV COMPARISON:  PET CT 12/12/2020 FINDINGS: There is a mixed lesion is seen within the L3 vertebral body corresponding to the pathologic fracture better seen on prior PET CT examination and superimposed methylmethacrylate related to vertebral augmentation noted on PET CT CT examination of 01/21/2021. The known metastasis within the left iliac crest represents a a cold lesion with surrounding osteoblastic activity. Focal uptake within the a mid body of the sternum is suspicious for an osseous metastasis in this location. Normal uptake and excretion within uptake within the sternoclavicular joints, shoulders, cervicothoracic spine are likely degenerative in nature. Normal soft tissue distribution. The kidneys and bladder. IMPRESSION: Uptake related to known pathologic fracture within the L3 vertebral body with probable postprocedural changes of vertebral augmentation and left iliac crest metastasis. 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 This could be better assessed with dedicated CT examination of the chest. Electronically Signed   By: Fidela Salisbury M.D.   On: 02/02/2021 15:22   CT Hip Right Wo Contrast  Result Date: 01/21/2021 CLINICAL DATA:  Fall, right hip pain EXAM: CT OF THE RIGHT HIP WITHOUT CONTRAST TECHNIQUE: Multidetector CT imaging of the  right hip was performed according to the standard protocol. Multiplanar CT image reconstructions were also generated. COMPARISON:  X-ray 01/21/2021 FINDINGS: Bones/Joint/Cartilage Right hip is intact without fracture or dislocation. No evidence of avascular necrosis of the femoral head. Right hip joint space is maintained. No hip joint effusion is evident. Visualized portion of the right hemipelvis is intact. Pubic symphysis and right sacroiliac joint intact without diastasis. No lytic or sclerotic bony lesions are identified. Site of known right iliac crest metastatic lesion is not included within the field of view on the current study. Ligaments Suboptimally assessed by CT. Muscles and Tendons No acute musculotendinous abnormality by CT. Soft tissues Mild subcutaneous edema overlying the greater trochanter. No organized fluid collection or hematoma. No abnormally enlarged right inguinal lymph nodes. Bilateral hydroceles. Atherosclerotic vascular calcifications are present. IMPRESSION: 1. No acute fracture or dislocation of the right hip. 2. Bilateral hydroceles. Electronically Signed   By: Davina Poke D.O.   On: 01/21/2021 20:22   MR HIP RIGHT WO CONTRAST  Result Date: 02/01/2021 CLINICAL DATA:  Severe right hip pain.  History of lung cancer. EXAM: MR OF THE  RIGHT HIP WITHOUT CONTRAST TECHNIQUE: Multiplanar, multisequence MR imaging was performed. No intravenous contrast was administered. COMPARISON:  01/13/2021 FINDINGS: Bones: No hip fracture, dislocation or avascular necrosis. Interval enlargement of a 4.4 x 4.5 cm soft tissue mass with bone destruction involving the left iliac crest with surrounding bone marrow edema. Mild edema in the adjacent iliacus muscle which may related to radiation changes. 20 mm osseous metastatic disease in the right iliac crest without a soft tissue component. 8 mm osseous metastatic disease in the S1 vertebral body. Normal sacrum and sacroiliac joints. No SI joint widening  or erosive changes. Mild lower lumbar spine spondylosis. Articular cartilage and labrum Articular cartilage:  No chondral defect. Labrum: Grossly intact, but evaluation is limited by lack of intraarticular fluid. Joint or bursal effusion Joint effusion:  No hip joint effusion.  No SI joint effusion. Bursae:  No bursa formation. Muscles and tendons Flexors: Muscle edema in the iliacus muscle bilaterally and right psoas muscle which may reflect myositis versus muscle strain. Extensors: Normal. Abductors: Normal. Adductors: Muscle edema in the adductor musculature bilaterally concerning for mild muscle strain versus myositis. Gluteals: Small partial tear of the periphery of the right gluteus minimus musculotendinous junction. Hamstrings: Normal. Other findings No pelvic free fluid. No fluid collection or hematoma. No inguinal lymphadenopathy. No inguinal hernia. IMPRESSION: 1. Interval enlargement of the left iliac crest soft tissue mass currently measuring 4.4 x 4.5 cm. Interval enlargement of the right iliac crest osseous metastatic disease measuring 20 mm. Interval enlargement of the S1 vertebral body focus of osseous metastatic disease. Overall appearance is consistent with progression of metastatic disease. 2. No hip fracture, dislocation or avascular necrosis. 3. Muscle edema in the iliacus muscle bilaterally and right psoas muscle as well as bilateral adductor musculature. Differential considerations include mild muscle strain versus myositis secondary to radiation therapy. 4. Small partial tear of the periphery of the right gluteus minimus musculotendinous junction. Electronically Signed   By: Kathreen Devoid M.D.   On: 02/01/2021 07:15   DG C-Arm 1-60 Min  Result Date: 01/17/2021 CLINICAL DATA:  Kyphoplasty. EXAM: DG C-ARM 1-60 MIN; LUMBAR SPINE - 2-3 VIEW FLUOROSCOPY TIME:  Fluoroscopy Time:  2 minutes and 2 seconds. Number of Acquired Spot Images: 7 COMPARISON:  MRI lumbar spine 01/13/2021. FINDINGS: Seven  C-arm fluoroscopic images were obtained intraoperatively and submitted for post operative interpretation. These images demonstrate kyphoplasty of the L3 vertebral body with slight lateral extravasation. Please see the performing provider's procedural report for further detail. IMPRESSION: Intraoperative fluoroscopy, as detailed above. Electronically Signed   By: Margaretha Sheffield M.D.   On: 01/17/2021 17:01   DG Hip Unilat  With Pelvis 2-3 Views Right  Result Date: 01/21/2021 CLINICAL DATA:  Right hip pain. History of metastatic non-small cell lung cancer EXAM: DG HIP (WITH OR WITHOUT PELVIS) 2-3V RIGHT COMPARISON:  MRI pelvis 01/13/2021 FINDINGS: No acute fracture or dislocation. Right hip joint space is preserved. Known destructive lesion at the left iliac crest is not well seen, partially obscured by overlying stool and bowel gas. Small marrow replacing lesion of the right iliac crest seen on recent MRI is not well demonstrated radiographically. Otherwise, no evidence of a new destructive bone lesion. IMPRESSION: 1. No acute fracture or dislocation. 2. Known metastatic lesions within the bilateral iliac crests are not well seen radiographically. Electronically Signed   By: Davina Poke D.O.   On: 01/21/2021 18:25    Subjective: Patient was seen and examined today.  Continues to have right hip  pain but stating that pain is bearable with current regimen.  Started his radiation yesterday.  Discharge Exam: Vitals:   02/04/21 0534 02/04/21 0800  BP: 131/69 118/67  Pulse: 64 78  Resp: 17 18  Temp: 98 F (36.7 C) 97.7 F (36.5 C)  SpO2: 99% 99%   Vitals:   02/03/21 1952 02/04/21 0000 02/04/21 0534 02/04/21 0800  BP: 134/82 110/63 131/69 118/67  Pulse: 90 72 64 78  Resp: 16 17 17 18   Temp: 98.3 F (36.8 C) 98.1 F (36.7 C) 98 F (36.7 C) 97.7 F (36.5 C)  TempSrc: Oral Oral Oral Oral  SpO2: 97% 99% 99% 99%  Weight:      Height:        General: Pt is alert, awake, not in acute  distress Cardiovascular: RRR, S1/S2 +, no rubs, no gallops Respiratory: CTA bilaterally, no wheezing, no rhonchi Abdominal: Soft, NT, ND, bowel sounds + Extremities: no edema, no cyanosis   The results of significant diagnostics from this hospitalization (including imaging, microbiology, ancillary and laboratory) are listed below for reference.    Microbiology: No results found for this or any previous visit (from the past 240 hour(s)).   Labs: BNP (last 3 results) No results for input(s): BNP in the last 8760 hours. Basic Metabolic Panel: Recent Labs  Lab 01/29/21 0537  NA 133*  K 4.5  CL 95*  CO2 30  GLUCOSE 145*  BUN 17  CREATININE 0.54*  CALCIUM 8.4*   Liver Function Tests: No results for input(s): AST, ALT, ALKPHOS, BILITOT, PROT, ALBUMIN in the last 168 hours. No results for input(s): LIPASE, AMYLASE in the last 168 hours. No results for input(s): AMMONIA in the last 168 hours. CBC: Recent Labs  Lab 01/29/21 0537  WBC 7.8  HGB 8.2*  HCT 25.0*  MCV 91.9  PLT 270   Cardiac Enzymes: No results for input(s): CKTOTAL, CKMB, CKMBINDEX, TROPONINI in the last 168 hours. BNP: Invalid input(s): POCBNP CBG: Recent Labs  Lab 02/03/21 0756 02/03/21 1118 02/03/21 1627 02/03/21 2020 02/04/21 0750  GLUCAP 189* 197* 171* 200* 186*   D-Dimer No results for input(s): DDIMER in the last 72 hours. Hgb A1c No results for input(s): HGBA1C in the last 72 hours. Lipid Profile No results for input(s): CHOL, HDL, LDLCALC, TRIG, CHOLHDL, LDLDIRECT in the last 72 hours. Thyroid function studies No results for input(s): TSH, T4TOTAL, T3FREE, THYROIDAB in the last 72 hours.  Invalid input(s): FREET3 Anemia work up No results for input(s): VITAMINB12, FOLATE, FERRITIN, TIBC, IRON, RETICCTPCT in the last 72 hours. Urinalysis    Component Value Date/Time   COLORURINE YELLOW (A) 01/21/2021 2001   APPEARANCEUR HAZY (A) 01/21/2021 2001   LABSPEC 1.016 01/21/2021 2001    PHURINE 7.0 01/21/2021 2001   GLUCOSEU NEGATIVE 01/21/2021 2001   HGBUR NEGATIVE 01/21/2021 2001   [place] 01/21/2021 2001   [place] 01/21/2021 2001   PROTEINUR NEGATIVE 01/21/2021 2001   NITRITE NEGATIVE 01/21/2021 2001   LEUKOCYTESUR NEGATIVE 01/21/2021 2001   Sepsis Labs Invalid input(s): PROCALCITONIN,  WBC,  LACTICIDVEN Microbiology No results found for this or any previous visit (from the past 240 hour(s)).  Time coordinating discharge: Over 30 minutes  SIGNED:  Lorella Nimrod, MD  [facility] 02/04/2021, 10:42 AM  If 7PM-7AM, please contact night-coverage [email or web address]  This record has been created using Systems analyst. Errors have been sought and corrected,but may not always be located. Such creation errors do not reflect on the standard of care.

## 2021-02-06 ENCOUNTER — Ambulatory Visit: Payer: Medicare HMO

## 2021-02-08 ENCOUNTER — Other Ambulatory Visit: Payer: Self-pay

## 2021-02-08 ENCOUNTER — Inpatient Hospital Stay: Payer: Medicare HMO | Attending: Hospice and Palliative Medicine | Admitting: Hospice and Palliative Medicine

## 2021-02-08 DIAGNOSIS — Z515 Encounter for palliative care: Secondary | ICD-10-CM

## 2021-02-08 DIAGNOSIS — D649 Anemia, unspecified: Secondary | ICD-10-CM | POA: Insufficient documentation

## 2021-02-08 DIAGNOSIS — C3492 Malignant neoplasm of unspecified part of left bronchus or lung: Secondary | ICD-10-CM

## 2021-02-08 DIAGNOSIS — D72829 Elevated white blood cell count, unspecified: Secondary | ICD-10-CM | POA: Insufficient documentation

## 2021-02-08 DIAGNOSIS — Z5112 Encounter for antineoplastic immunotherapy: Secondary | ICD-10-CM | POA: Insufficient documentation

## 2021-02-08 DIAGNOSIS — G893 Neoplasm related pain (acute) (chronic): Secondary | ICD-10-CM

## 2021-02-08 DIAGNOSIS — E871 Hypo-osmolality and hyponatremia: Secondary | ICD-10-CM | POA: Insufficient documentation

## 2021-02-08 DIAGNOSIS — C3412 Malignant neoplasm of upper lobe, left bronchus or lung: Secondary | ICD-10-CM | POA: Insufficient documentation

## 2021-02-08 DIAGNOSIS — C7951 Secondary malignant neoplasm of bone: Secondary | ICD-10-CM | POA: Insufficient documentation

## 2021-02-08 DIAGNOSIS — Z79899 Other long term (current) drug therapy: Secondary | ICD-10-CM | POA: Insufficient documentation

## 2021-02-08 NOTE — Progress Notes (Signed)
Virtual Visit via Telephone Note  I connected with Donald Lowery on 02/08/21 at  2:00 PM EDT by telephone and verified that I am speaking with the correct person using two identifiers.  Location: Patient: Home Provider: Clinic   I discussed the limitations, risks, security and privacy concerns of performing an evaluation and management service by telephone and the availability of in person appointments. I also discussed with the patient that there may be a patient responsible charge related to this service. The patient expressed understanding and agreed to proceed.   History of Present Illness: Donald Lowery is a 70 y.o. 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  Patient has had significant left shoulder pain.  PET/CT demonstrated a 7.1 cm left apical mass, which abuts the mediastinum encasing the left subclavian and vertebral arteries and is felt probably to have nerve impingement leading to referred pain.  He also has vocal cord paralysis.  Patient was referred to palliative care to help address goals to manage ongoing symptoms.   Observations/Objective: I called and spoke with patient/wife by phone.    Since discharging home from the hospital, patient's pain is reportedly improved.  It is still present in the right hip that he is now ambulating with use of the walker.  No other changes or symptomatic complaints.  Patient has enough pain medications and did not need refills today.  Will reach out to Dr. Grayland Ormond regarding rescheduling follow-up in the clinic.  Assessment and Plan: Right hip pain -continue fentanyl/hydromorphone/dexamethasone  Follow Up Instructions: RTC 1 to 2 weeks   I discussed the assessment and treatment plan with the patient. The patient was provided an opportunity to ask questions and all were answered. The patient agreed with the plan and demonstrated an understanding of the  instructions.   The patient was advised to call back or seek an in-person evaluation if the symptoms worsen or if the condition fails to improve as anticipated.  I provided 10 minutes of non-face-to-face time during this encounter.   Irean Hong, NP

## 2021-02-09 NOTE — Progress Notes (Signed)
Pharmacist Chemotherapy Monitoring - Initial Assessment    Anticipated start date: 02/16/21   The following has been reviewed per standard work regarding the patient's treatment regimen: The patient's diagnosis, treatment plan and drug doses, and organ/hematologic function Lab orders and baseline tests specific to treatment regimen  The treatment plan start date, drug sequencing, and pre-medications Prior authorization status  Patient's documented medication list, including drug-drug interaction screen and prescriptions for anti-emetics and supportive care specific to the treatment regimen The drug concentrations, fluid compatibility, administration routes, and timing of the medications to be used The patient's access for treatment and lifetime cumulative dose history, if applicable  The patient's medication allergies and previous infusion related reactions, if applicable   Changes made to treatment plan:  treatment plan date  Follow up needed:  [place], [place], 02/09/2021  10:30 AM

## 2021-02-13 ENCOUNTER — Ambulatory Visit: Payer: Medicare HMO

## 2021-02-13 ENCOUNTER — Other Ambulatory Visit: Payer: Self-pay | Admitting: *Deleted

## 2021-02-13 DIAGNOSIS — M5442 Lumbago with sciatica, left side: Secondary | ICD-10-CM

## 2021-02-13 DIAGNOSIS — M5441 Lumbago with sciatica, right side: Secondary | ICD-10-CM

## 2021-02-13 MED ORDER — HYDROMORPHONE HCL 4 MG PO TABS: 4.0000 mg | ORAL_TABLET | ORAL | 0 refills | Status: AC | PRN

## 2021-02-13 NOTE — Progress Notes (Signed)
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  INTERVAL HISTORY: Patient returns to clinic today for further evaluation, hospital follow-up, and initiation of maintenance Keytruda.  His pain is significantly better controlled after completing XRT.  He has mild sacral pain secondary to bedsores.  He continues to have increased weakness and fatigue, but states this is getting better and has home physical therapy.  He has no neurologic complaints.  He denies any recent fevers or illnesses.  He has a fair appetite and denies weight loss.  He denies any chest pain, shortness of breath, cough, or hemoptysis.  He denies any abdominal pain.  He has no nausea, vomiting, constipation, or diarrhea.  He has no urinary complaints.  Patient offers no further specific complaints today.  REVIEW OF SYSTEMS:   Review of Systems  Constitutional: Negative.  Negative for fever, malaise/fatigue and weight loss.  Respiratory: Negative.  Negative for cough, hemoptysis and shortness of breath.   Cardiovascular: Negative.  Negative for chest pain and leg swelling.  Gastrointestinal:  Negative for abdominal pain.  Genitourinary: Negative.  Negative for dysuria.  Musculoskeletal:  Positive for joint pain. Negative for back pain.  Skin: Negative.  Negative for rash.  Neurological: Negative.  Negative for dizziness, focal weakness, weakness and headaches.   As per HPI. Otherwise, a complete review of systems is negative.  PAST MEDICAL HISTORY: Past Medical History:  Diagnosis Date   Aortic atherosclerosis ([place])    [place] hernia 09/21/2020   fatty   Coronary artery  disease    a. 04/2018 NSTEMI/Cath: LM nl, LAD 50p, 4md diffuse-small caliber, LCX heavily Ca2+ sev prox/mid dzs, RCA 90 diff distal dzs into RPL and RPDA. EF 50-55%-->Med Rx.   Hepatic steatosis    History of 2019 novel coronavirus disease (COVID-19) 10/12/2020   History of echocardiogram    a. 04/2018 Echo: EF 55-60%, no rwma, mild MR, mildly dil LA. Nl RV fxn.   Hyperlipidemia    Hypertension    [place]STEMI (non-ST elevated myocardial infarction) [place] 05/15[place]   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 [place]   a.) 7 cm LUL mass with left subclavian/proximal vertebral encasement   Paraseptal emphysema ([facility])    T2DM (type 2 diabetes mellitus) [place]    Tobacco abuse    a. Quit 04/2018.    PAST SURGICAL HISTORY: Past Surgical History:  Procedure Laterality Date   BRAIN SURGERY     CARDIAC CATHETERIZATION     IR IMAGING GUIDED PORT INSERTION  10/28/2020   KYPHOPLASTY N/A 01/17/2021   Procedure: KCl[place]bon JabsRFA;  Surgeon: MHessie Knows MD;  Location: [facility] OR[facility];  Service: Orthopedics;  Laterality: N/A;   LEFT HEART CATH AND CORONARY ANGIOGRAPHY N/A 05/16/2018   Procedure: LEFT HEART CATH AND CORONARY ANGIOGRAPHY poss PCI;  Surgeon: GMinna Merritts MD;  [facility];  Service: Cardiovascular;  Laterality: N/A;   VIDEO BRONCHOSCOPY WITH ENDOBRONCHIAL NAVIGATION N/A 10/24/2020   Procedure: ROBOTIC ASSISTED VIDEO BRONCHOSCOPY WITH ENDOBRONCHIAL NAVIGATION;  Surgeon: GTyler Pita MD;  Location: [facility];  Service: Pulmonary;  Laterality: N/A;    FAMILY HISTORY: Family History  Problem Relation Age of Onset   Heart Problems Mother  Heart attack Father    Diabetes Brother    Heart Problems Brother     ADVANCED DIRECTIVES (Y/N):  N  HEALTH MAINTENANCE: Social History   Tobacco Use   Smoking status: Former    Packs/day: 1.00    Years: 45.00    Pack years: 45.00    Types: Cigarettes    Quit date: 05/15/2018    Years since quitting: 2.7   Smokeless tobacco:  Never  Vaping Use   Vaping Use: Never used  Substance Use Topics   Alcohol use: Never   Drug use: Never     Colonoscopy:  PAP:  Bone density:  Lipid panel:  No Known Allergies  Current Outpatient Medications  Medication Sig Dispense Refill   acetaminophen (TYLENOL) 500 MG tablet Take 2 tablets (1,000 mg total) by mouth 3 (three) times daily. 30 tablet 0   aspirin EC 81 MG EC tablet Take 1 tablet (81 mg total) by mouth daily. 30 tablet 0   dexamethasone (DECADRON) 4 MG tablet Take 1 tablet (4 mg total) by mouth every 12 (twelve) hours. 60 tablet 0   DULoxetine (CYMBALTA) 30 MG capsule Take 1 capsule (30 mg total) by mouth daily. 30 capsule 3   ezetimibe (ZETIA) 10 MG tablet Take 1 tablet (10 mg total) by mouth daily. 90 tablet 3   feeding supplement (ENSURE ENLIVE / ENSURE PLUS) LIQD Take 237 mLs by mouth 3 (three) times daily between meals. 237 mL 12   fentaNYL (DURAGESIC) 50 MCG/HR Place 1 patch onto the skin every 3 (three) days. 5 patch 0   gabapentin (NEURONTIN) 600 MG tablet Take 1 tablet (600 mg total) by mouth 3 (three) times daily. 90 tablet 1   HYDROmorphone (DILAUDID) 4 MG tablet Take 1-1.5 tablets (4-6 mg total) by mouth every 4 (four) hours as needed for severe pain. 60 tablet 0   isosorbide mononitrate (IMDUR) 30 MG 24 hr tablet Take 1 tablet (30 mg total) by mouth daily. 90 tablet 3   Menthol-Methyl Salicylate (ICY HOT) 68-08 % STCK Apply 1 application topically daily as needed (shoulder pain).     metFORMIN (GLUCOPHAGE) 500 MG tablet Take 1 tablet (500 mg total) by mouth 2 (two) times daily with a meal. 180 tablet 1   methocarbamol (ROBAXIN) 750 MG tablet Take 1 tablet (750 mg total) by mouth 4 (four) times daily. 90 tablet 1   metoprolol tartrate (LOPRESSOR) 25 MG tablet Take 1 tablet (25 mg total) by mouth 2 (two) times daily. 60 tablet 0   naloxone (NARCAN) 2 MG/2ML injection Inject 0.4 mLs (0.4 mg total) into the vein as needed. 2 mL 0   polyethylene glycol (MIRALAX  / GLYCOLAX) 17 g packet Take 17 g by mouth daily. 14 each 0   senna-docusate (SENOKOT-S) 8.6-50 MG tablet Take 2 tablets by mouth at bedtime as needed for mild constipation. 30 tablet 0   sucralfate (CARAFATE) 1 g tablet Take 0.5 tablets (0.5 g total) by mouth 3 (three) times daily before meals. 60 tablet 5   traZODone (DESYREL) 50 MG tablet Take 0.5 tablets (25 mg total) by mouth at bedtime as needed for sleep. 30 tablet 0   vitamin B-12 (CYANOCOBALAMIN) 1000 MCG tablet Take 1,000 mcg by mouth daily.     ALPRAZolam (XANAX) 0.25 MG tablet Take 1 tablet (0.25 mg total) by mouth 2 (two) times daily as needed for anxiety. 30 tablet 0   cyclobenzaprine (FLEXERIL) 5 MG tablet Take 1 tablet (5 mg total) by mouth  3 (three) times daily as needed for muscle spasms. 30 tablet 0   dicyclomine (BENTYL) 10 MG capsule Take 1 capsule (10 mg total) by mouth 3 (three) times daily as needed for up to 14 days for spasms. 20 capsule 0   icosapent Ethyl (VASCEPA) 1 g capsule Take 2 capsules (2 g total) by mouth 2 (two) times daily. 120 capsule 6   No current facility-administered medications for this visit.    OBJECTIVE: Vitals:   02/16/21 1322  BP: 116/76  Pulse: 75  Resp: 18  Temp: 97.9 F (36.6 C)  SpO2: 98%     Body mass index is 18.58 kg/m.    ECOG FS:2 - Symptomatic, <50% confined to bed  General: Thin, no acute distress.  Sitting in a wheelchair. Eyes: Pink conjunctiva, anicteric sclera. HEENT: Normocephalic, moist mucous membranes. Lungs: No audible wheezing or coughing. Heart: Regular rate and rhythm. Abdomen: Soft, nontender, no obvious distention. Musculoskeletal: No edema, cyanosis, or clubbing. Neuro: Alert, answering all questions appropriately. Cranial nerves grossly intact. Skin: No rashes or petechiae noted. Psych: Normal affect.   LAB RESULTS:  Lab Results  Component Value Date   NA 129 (L) 02/16/2021   K 4.7 02/16/2021   CL 92 (L) 02/16/2021   CO2 26 02/16/2021   GLUCOSE  170 (H) 02/16/2021   BUN 29 (H) 02/16/2021   CREATININE 0.65 02/16/2021   CALCIUM 9.5 02/16/2021   PROT 6.5 02/16/2021   ALBUMIN 2.9 (L) 02/16/2021   AST 17 02/16/2021   ALT 21 02/16/2021   ALKPHOS 86 02/16/2021   BILITOT 0.6 02/16/2021   GFRNONAA >60 02/16/2021   GFRAA >60 07/14/2019    Lab Results  Component Value Date   WBC 15.9 (H) 02/16/2021   NEUTROABS 13.5 (H) 02/16/2021   HGB 10.3 (L) 02/16/2021   HCT 31.3 (L) 02/16/2021   MCV 92.1 02/16/2021   PLT 264 02/16/2021     STUDIES: DG Lumbar Spine 2-3 Views  Result Date: 01/17/2021 CLINICAL DATA:  Kyphoplasty. EXAM: DG C-ARM 1-60 MIN; LUMBAR SPINE - 2-3 VIEW FLUOROSCOPY TIME:  Fluoroscopy Time:  2 minutes and 2 seconds. Number of Acquired Spot Images: 7 COMPARISON:  MRI lumbar spine 01/13/2021. FINDINGS: Seven C-arm fluoroscopic images were obtained intraoperatively and submitted for post operative interpretation. These images demonstrate kyphoplasty of the L3 vertebral body with slight lateral extravasation. Please see the performing provider's procedural report for further detail. IMPRESSION: Intraoperative fluoroscopy, as detailed above. Electronically Signed   By: Margaretha Sheffield M.D.   On: 01/17/2021 17:01   CT Lumbar Spine Wo Contrast  Result Date: 01/21/2021 CLINICAL DATA:  Initial evaluation for worsening back pain, right hip pain, recent surgery. EXAM: CT LUMBAR SPINE WITHOUT CONTRAST TECHNIQUE: Multidetector CT imaging of the lumbar spine was performed without intravenous contrast administration. Multiplanar CT image reconstructions were also generated. COMPARISON:  MRI from 01/13/2021. FINDINGS: Segmentation: Standard. Lowest well-formed disc space labeled the L5-S1 level. Alignment: Mild sigmoid scoliotic curvature of the thoracolumbar spine. 3 mm retrolisthesis of L1 on L2. Vertebrae: There has been interval performance of vertebral augmentation for previously identified pathologic fracture at L3. No visible  complicating features. Associated height loss is stable without significant interval collapse. Posterior convex bowing of the L3 vertebral body with resultant moderate spinal stenosis is grossly stable. Extraosseous extension of tumor into the adjacent right psoas musculature also grossly similar. Vertebral body height otherwise maintained with no other acute or interval fracture. Visualized sacrum intact. SI joints symmetric and normal. There is an  additional lytic metastatic lesion involving the left iliac wing (series 7, image 45). No other visible osseous metastatic disease. Paraspinal and other soft tissues: Extraosseous extension of tumor into the right psoas musculature related to the L3 metastasis, grossly stable from prior MRI. Paraspinous soft tissues demonstrate no other acute finding. Advanced aorto bi-iliac atherosclerotic disease. Disc levels: L1-2: Trace retrolisthesis with mild disc bulge. No spinal stenosis. Foramina remain patent. L2-3: Mild disc bulge with facet hypertrophy. No spinal stenosis. Foramina remain patent. L3-4: Degenerative intervertebral disc space narrowing with diffuse disc bulge. Mild facet hypertrophy. Moderate to advanced spinal stenosis related to posterior convex bowing related to the pathologic L3 compression fracture noted, grossly similar. Moderate to severe right with mild left L3 foraminal stenosis, grossly stable. L4-5: Mild disc bulge with facet hypertrophy. No spinal stenosis. No more than mild bilateral L4 foraminal narrowing. L5-S1: Mild disc bulge. Right greater than left facet arthrosis. No spinal stenosis. Foramina remain patent. IMPRESSION: 1. Interval performance of vertebral augmentation for previously identified pathologic fracture at L3. No visible complication. Associated height loss is stable without further interval collapse. Posterior convex bowing of the L3 vertebral body with resultant moderate to advanced spinal stenosis, similar to previous. Moderate  to severe right L3 foraminal stenosis also grossly stable. 2. Additional lytic metastatic lesion involving the left iliac wing. 3. No other acute abnormality within the lumbar spine. 4. Aortic Atherosclerosis (ICD10-I70.0). Electronically Signed   By: Jeannine Boga M.D.   On: 01/21/2021 20:40   NM Bone Scan Whole Body  Result Date: 02/02/2021 CLINICAL DATA:  Lung cancer, osseous metastatic disease, staging examination EXAM: NUCLEAR MEDICINE WHOLE BODY BONE SCAN TECHNIQUE: Whole body anterior and posterior images were obtained approximately 3 hours after intravenous injection of radiopharmaceutical. RADIOPHARMACEUTICALS:  21.7 mCi Technetium-58mMDP IV COMPARISON:  PET CT 12/12/2020 FINDINGS: There is a mixed lesion is seen within the L3 vertebral body corresponding to the pathologic fracture better seen on prior PET CT examination and superimposed methylmethacrylate related to vertebral augmentation noted on PET CT CT examination of 01/21/2021. The known metastasis within the left iliac crest represents a a cold lesion with surrounding osteoblastic activity. Focal uptake within the a mid body of the sternum is suspicious for an osseous metastasis in this location. Normal uptake and excretion within uptake within the sternoclavicular joints, shoulders, cervicothoracic spine are likely degenerative in nature. Normal soft tissue distribution. The kidneys and bladder. IMPRESSION: Uptake related to known pathologic fracture within the L3 vertebral body with probable postprocedural changes of vertebral augmentation and left iliac crest metastasis. 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 This could be better assessed with dedicated CT examination of the chest. Electronically Signed   By: AFidela SalisburyM.D.   On: 02/02/2021 15:22   CT Hip Right Wo Contrast  Result Date: 01/21/2021 CLINICAL DATA:  Fall, right hip pain EXAM: CT OF THE RIGHT HIP WITHOUT CONTRAST TECHNIQUE: Multidetector CT imaging of the right hip was  performed according to the standard protocol. Multiplanar CT image reconstructions were also generated. COMPARISON:  X-ray 01/21/2021 FINDINGS: Bones/Joint/Cartilage Right hip is intact without fracture or dislocation. No evidence of avascular necrosis of the femoral head. Right hip joint space is maintained. No hip joint effusion is evident. Visualized portion of the right hemipelvis is intact. Pubic symphysis and right sacroiliac joint intact without diastasis. No lytic or sclerotic bony lesions are identified. Site of known right iliac crest metastatic lesion is not included within the field of view on the current study. Ligaments Suboptimally assessed by  CT. Muscles and Tendons No acute musculotendinous abnormality by CT. Soft tissues Mild subcutaneous edema overlying the greater trochanter. No organized fluid collection or hematoma. No abnormally enlarged right inguinal lymph nodes. Bilateral hydroceles. Atherosclerotic vascular calcifications are present. IMPRESSION: 1. No acute fracture or dislocation of the right hip. 2. Bilateral hydroceles. Electronically Signed   By: Davina Poke D.O.   On: 01/21/2021 20:22   MR HIP RIGHT WO CONTRAST  Result Date: 02/01/2021 CLINICAL DATA:  Severe right hip pain.  History of lung cancer. EXAM: MR OF THE RIGHT HIP WITHOUT CONTRAST TECHNIQUE: Multiplanar, multisequence MR imaging was performed. No intravenous contrast was administered. COMPARISON:  01/13/2021 FINDINGS: Bones: No hip fracture, dislocation or avascular necrosis. Interval enlargement of a 4.4 x 4.5 cm soft tissue mass with bone destruction involving the left iliac crest with surrounding bone marrow edema. Mild edema in the adjacent iliacus muscle which may related to radiation changes. 20 mm osseous metastatic disease in the right iliac crest without a soft tissue component. 8 mm osseous metastatic disease in the S1 vertebral body. Normal sacrum and sacroiliac joints. No SI joint widening or erosive  changes. Mild lower lumbar spine spondylosis. Articular cartilage and labrum Articular cartilage:  No chondral defect. Labrum: Grossly intact, but evaluation is limited by lack of intraarticular fluid. Joint or bursal effusion Joint effusion:  No hip joint effusion.  No SI joint effusion. Bursae:  No bursa formation. Muscles and tendons Flexors: Muscle edema in the iliacus muscle bilaterally and right psoas muscle which may reflect myositis versus muscle strain. Extensors: Normal. Abductors: Normal. Adductors: Muscle edema in the adductor musculature bilaterally concerning for mild muscle strain versus myositis. Gluteals: Small partial tear of the periphery of the right gluteus minimus musculotendinous junction. Hamstrings: Normal. Other findings No pelvic free fluid. No fluid collection or hematoma. No inguinal lymphadenopathy. No inguinal hernia. IMPRESSION: 1. Interval enlargement of the left iliac crest soft tissue mass currently measuring 4.4 x 4.5 cm. Interval enlargement of the right iliac crest osseous metastatic disease measuring 20 mm. Interval enlargement of the S1 vertebral body focus of osseous metastatic disease. Overall appearance is consistent with progression of metastatic disease. 2. No hip fracture, dislocation or avascular necrosis. 3. Muscle edema in the iliacus muscle bilaterally and right psoas muscle as well as bilateral adductor musculature. Differential considerations include mild muscle strain versus myositis secondary to radiation therapy. 4. Small partial tear of the periphery of the right gluteus minimus musculotendinous junction. Electronically Signed   By: Kathreen Devoid M.D.   On: 02/01/2021 07:15   DG C-Arm 1-60 Min  Result Date: 01/17/2021 CLINICAL DATA:  Kyphoplasty. EXAM: DG C-ARM 1-60 MIN; LUMBAR SPINE - 2-3 VIEW FLUOROSCOPY TIME:  Fluoroscopy Time:  2 minutes and 2 seconds. Number of Acquired Spot Images: 7 COMPARISON:  MRI lumbar spine 01/13/2021. FINDINGS: Seven C-arm  fluoroscopic images were obtained intraoperatively and submitted for post operative interpretation. These images demonstrate kyphoplasty of the L3 vertebral body with slight lateral extravasation. Please see the performing provider's procedural report for further detail. IMPRESSION: Intraoperative fluoroscopy, as detailed above. Electronically Signed   By: Margaretha Sheffield M.D.   On: 01/17/2021 17:01   DG Hip Unilat  With Pelvis 2-3 Views Right  Result Date: 01/21/2021 CLINICAL DATA:  Right hip pain. History of metastatic non-small cell lung cancer EXAM: DG HIP (WITH OR WITHOUT PELVIS) 2-3V RIGHT COMPARISON:  MRI pelvis 01/13/2021 FINDINGS: No acute fracture or dislocation. Right hip joint space is preserved. Known destructive lesion at  the left iliac crest is not well seen, partially obscured by overlying stool and bowel gas. Small marrow replacing lesion of the right iliac crest seen on recent MRI is not well demonstrated radiographically. Otherwise, no evidence of a new destructive bone lesion. IMPRESSION: 1. No acute fracture or dislocation. 2. Known metastatic lesions within the bilateral iliac crests are not well seen radiographically. Electronically Signed   By: Davina Poke D.O.   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  PD-L1 5%.  PLAN:    1. 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  PET scan results from October 04, 2020 reviewed independently with hypermetabolic bilateral pulmonary nodules without mediastinal or hilar lymphadenopathy.  Although imaging suggests stage IV disease, patient was initially treated as a stage IIIa.   MRI of the brain on October 08, 2020 was negative for disease.  Repeat PET scan results from December 13, 2020 reviewed independently with improvement of hypermetabolic left apical mass as well as bilateral pulmonary nodules, patient noted to have a new  hypermetabolic lytic lesion in his left iliac crest.  Patient has now completed all of his XRT with significant improvement of his pain.  He completed carboplatinum and Taxol on January 11, 2021.  Patient's PDL-1 is 5%, therefore will now proceed with cycle 1 of maintenance Keytruda every 21 days.  Return to clinic in 3 weeks for further evaluation and consideration of cycle 2.    2.  Pain: Well controlled after completion of XRT and on current narcotic regimen.  Continue current treatment.  Will arrange follow-up with palliative care in 3 weeks.   3.  Weight loss: Weight has stabilized.  Appreciate dietary input. 4.  Hyponatremia: Chronic and unchanged.  Patient's sodium is 129 today.   5.  Leukocytosis: Likely reactive, monitor. 6.  Anemia: Hemoglobin continues to trend up and is now 10.3. 7.  Transaminitis: Resolved. 8.  Hypokalemia: Resolved.  Patient expressed understanding and was in agreement with this plan. He also understands that He can call clinic at any time with any questions, concerns, or complaints.   Cancer Staging Non-small cell cancer of left lung [facility]) Staging form: Lung, AJCC 8th Edition - Clinical stage from 10/28/2020: Stage IVA (cT4, cN0, pM1a) - Signed by Lloyd Huger, MD on 10/28/2020  Lloyd Huger, MD   02/16/2021 2:00 PM

## 2021-02-14 ENCOUNTER — Telehealth: Payer: Self-pay

## 2021-02-14 NOTE — Telephone Encounter (Signed)
Spoke with patient's wife Sunday Spillers and scheduled an in-person Palliative Consult for 02/21/21 @ 10:30AM.  COVID screening was negative. No pets in home. Patient lives with wife.   Consent obtained; updated Outlook/Netsmart/Team List and Epic.   Family is aware they may be receiving a call from provider the day before or day of to confirm appointment.

## 2021-02-14 NOTE — Telephone Encounter (Signed)
Attempted to contact patient's wife Sunday Spillers to reschedule a Palliative Care consult appointment. No answer left a message to return call.

## 2021-02-15 ENCOUNTER — Telehealth: Payer: Self-pay | Admitting: *Deleted

## 2021-02-15 NOTE — Telephone Encounter (Signed)
VERBAL ORDER called to [facility] with [facility]

## 2021-02-15 NOTE — Telephone Encounter (Signed)
Skeet Simmer, PT with [facility] called asking if Dr Grayland Ormond will sign orders for this patient who was recently hospitalized and had home PT ordered by hospitalist. He states that he contacted patient PCP, but he refused to sign orders because he has not seen patient since March and the diagnosed for therapy is due to bone mets in his hip. He is asking for PT orders 1 wk 1, 2 wk 1, 1 wk 3 and also to add SN for management of medications, wound care for sacral sore. Please advise if you will sign orders

## 2021-02-16 ENCOUNTER — Inpatient Hospital Stay: Payer: Medicare HMO

## 2021-02-16 ENCOUNTER — Inpatient Hospital Stay (HOSPITAL_BASED_OUTPATIENT_CLINIC_OR_DEPARTMENT_OTHER): Payer: Medicare HMO | Admitting: Oncology

## 2021-02-16 ENCOUNTER — Other Ambulatory Visit: Payer: Self-pay

## 2021-02-16 VITALS — BP 116/76 | HR 75 | Temp 97.9°F | Resp 18 | Wt 108.3 lb

## 2021-02-16 DIAGNOSIS — M5459 Other low back pain: Secondary | ICD-10-CM

## 2021-02-16 DIAGNOSIS — D649 Anemia, unspecified: Secondary | ICD-10-CM | POA: Diagnosis not present

## 2021-02-16 DIAGNOSIS — C3492 Malignant neoplasm of unspecified part of left bronchus or lung: Secondary | ICD-10-CM

## 2021-02-16 DIAGNOSIS — E871 Hypo-osmolality and hyponatremia: Secondary | ICD-10-CM | POA: Diagnosis not present

## 2021-02-16 DIAGNOSIS — C7951 Secondary malignant neoplasm of bone: Secondary | ICD-10-CM | POA: Diagnosis not present

## 2021-02-16 DIAGNOSIS — Z5112 Encounter for antineoplastic immunotherapy: Secondary | ICD-10-CM | POA: Diagnosis not present

## 2021-02-16 DIAGNOSIS — Z79899 Other long term (current) drug therapy: Secondary | ICD-10-CM | POA: Diagnosis not present

## 2021-02-16 DIAGNOSIS — D72829 Elevated white blood cell count, unspecified: Secondary | ICD-10-CM | POA: Diagnosis not present

## 2021-02-16 DIAGNOSIS — C3412 Malignant neoplasm of upper lobe, left bronchus or lung: Secondary | ICD-10-CM | POA: Diagnosis present

## 2021-02-16 DIAGNOSIS — M549 Dorsalgia, unspecified: Secondary | ICD-10-CM

## 2021-02-16 DIAGNOSIS — Z95828 Presence of other vascular implants and grafts: Secondary | ICD-10-CM

## 2021-02-16 LAB — CBC WITH DIFFERENTIAL/PLATELET
Abs Immature Granulocytes: 0.8 10*3/uL — ABNORMAL HIGH (ref 0.00–0.07)
Basophils Absolute: 0.1 10*3/uL (ref 0.0–0.1)
Basophils Relative: 0 %
Eosinophils Absolute: 0 10*3/uL (ref 0.0–0.5)
Eosinophils Relative: 0 %
HCT: 31.3 % — ABNORMAL LOW (ref 39.0–52.0)
Hemoglobin: 10.3 g/dL — ABNORMAL LOW (ref 13.0–17.0)
Immature Granulocytes: 5 %
Lymphocytes Relative: 6 %
Lymphs Abs: 0.9 10*3/uL (ref 0.7–4.0)
MCH: 30.3 pg (ref 26.0–34.0)
MCHC: 32.9 g/dL (ref 30.0–36.0)
MCV: 92.1 fL (ref 80.0–100.0)
Monocytes Absolute: 0.6 10*3/uL (ref 0.1–1.0)
Monocytes Relative: 4 %
Neutro Abs: 13.5 10*3/uL — ABNORMAL HIGH (ref 1.7–7.7)
Neutrophils Relative %: 85 %
Platelets: 264 10*3/uL (ref 150–400)
RBC: 3.4 MIL/uL — ABNORMAL LOW (ref 4.22–5.81)
RDW: 18.4 % — ABNORMAL HIGH (ref 11.5–15.5)
WBC: 15.9 10*3/uL — ABNORMAL HIGH (ref 4.0–10.5)
nRBC: 0 % (ref 0.0–0.2)

## 2021-02-16 LAB — COMPREHENSIVE METABOLIC PANEL
ALT: 21 U/L (ref 0–44)
AST: 17 U/L (ref 15–41)
Albumin: 2.9 g/dL — ABNORMAL LOW (ref 3.5–5.0)
Alkaline Phosphatase: 86 U/L (ref 38–126)
Anion gap: 11 (ref 5–15)
BUN: 29 mg/dL — ABNORMAL HIGH (ref 8–23)
CO2: 26 mmol/L (ref 22–32)
Calcium: 9.5 mg/dL (ref 8.9–10.3)
Chloride: 92 mmol/L — ABNORMAL LOW (ref 98–111)
Creatinine, Ser: 0.65 mg/dL (ref 0.61–1.24)
GFR, Estimated: >60 mL/min (ref >60)
Glucose, Bld: 170 mg/dL — ABNORMAL HIGH (ref 70–99)
Potassium: 4.7 mmol/L (ref 3.5–5.1)
Sodium: 129 mmol/L — ABNORMAL LOW (ref 135–145)
Total Bilirubin: 0.6 mg/dL (ref 0.3–1.2)
Total Protein: 6.5 g/dL (ref 6.5–8.1)

## 2021-02-16 LAB — TSH: TSH: 0.352 u[IU]/mL (ref 0.350–4.500)

## 2021-02-16 MED ORDER — ALPRAZOLAM 0.25 MG PO TABS
0.2500 mg | ORAL_TABLET | Freq: Two times a day (BID) | ORAL | 0 refills | Status: AC | PRN
Start: 2021-02-16 — End: ?

## 2021-02-16 MED ORDER — HEPARIN SOD (PORK) LOCK FLUSH 100 UNIT/ML IV SOLN
500.0000 [IU] | Freq: Once | INTRAVENOUS | Status: AC
  Administered 2021-02-16: 500 [IU] via INTRAVENOUS
  Filled 2021-02-16: qty 5

## 2021-02-16 MED ORDER — SODIUM CHLORIDE 0.9 % IV SOLN
Freq: Once | INTRAVENOUS | Status: AC
  Filled 2021-02-16: qty 250

## 2021-02-16 MED ORDER — CYCLOBENZAPRINE HCL 5 MG PO TABS: 5.0000 mg | ORAL_TABLET | Freq: Three times a day (TID) | ORAL | 0 refills | Status: AC | PRN

## 2021-02-16 MED ORDER — SODIUM CHLORIDE 0.9 % IV SOLN
200.0000 mg | Freq: Once | INTRAVENOUS | Status: AC
  Administered 2021-02-16: 200 mg via INTRAVENOUS
  Filled 2021-02-16: qty 8

## 2021-02-16 NOTE — Patient Instructions (Signed)
[facility]  Discharge Instructions: Thank you for choosing [facility] to provide your oncology and hematology care.  If you have a lab appointment with the [facility], please go directly to the [facility] and check in at the registration area.  Wear comfortable clothing and clothing appropriate for easy access to any Portacath or PICC line.   We strive to give you quality time with your provider. You may need to reschedule your appointment if you arrive late (15 or more minutes).  Arriving late affects you and other patients whose appointments are after yours.  Also, if you miss three or more appointments without notifying the office, you may be dismissed from the clinic at the provider's discretion.      For prescription refill requests, have your pharmacy contact our office and allow 72 hours for refills to be completed.    Today you received the following chemotherapy and/or immunotherapy agents KEYTRUDA      To help prevent nausea and vomiting after your treatment, we encourage you to take your nausea medication as directed.  BELOW ARE SYMPTOMS THAT SHOULD BE REPORTED IMMEDIATELY: *FEVER GREATER THAN 100.4 F (38 C) OR HIGHER *CHILLS OR SWEATING *NAUSEA AND VOMITING THAT IS NOT CONTROLLED WITH YOUR NAUSEA MEDICATION *UNUSUAL SHORTNESS OF BREATH *UNUSUAL BRUISING OR BLEEDING *URINARY PROBLEMS (pain or burning when urinating, or frequent urination) *BOWEL PROBLEMS (unusual diarrhea, constipation, pain near the anus) TENDERNESS IN MOUTH AND THROAT WITH OR WITHOUT PRESENCE OF ULCERS (sore throat, sores in mouth, or a toothache) UNUSUAL RASH, SWELLING OR PAIN  UNUSUAL VAGINAL DISCHARGE OR ITCHING   Items with * indicate a potential emergency and should be followed up as soon as possible or go to the Emergency Department if any problems should occur.  Please show the CHEMOTHERAPY ALERT CARD or IMMUNOTHERAPY ALERT CARD at check-in to  the Emergency Department and triage nurse.  Should you have questions after your visit or need to cancel or reschedule your appointment, please contact [facility]  [phone] and follow the prompts.  Office hours are 8:00 a.m. to 4:30 p.m. Monday - Friday. Please note that voicemails left after 4:00 p.m. may not be returned until the following business day.  We are closed weekends and major holidays. You have access to a nurse at all times for urgent questions. Please call the main number to the clinic [phone] and follow the prompts.  For any non-urgent questions, you may also contact your provider using MyChart. We now offer e-Visits for anyone 57 and older to request care online for non-urgent symptoms. For details visit [email or web address].   Also download the MyChart app! Go to the app store, search "MyChart", open the app, select [facility], and log in with your MyChart username and password.  Due to Covid, a mask is required upon entering the hospital/clinic. If you do not have a mask, one will be given to you upon arrival. For doctor visits, patients may have 1 support person aged 44 or older with them. For treatment visits, patients cannot have anyone with them due to current Covid guidelines and our immunocompromised population.   Pembrolizumab injection What is this medication? PEMBROLIZUMAB (pem broe liz ue mab) is a monoclonal antibody. It is used to treat certain types of cancer. This medicine may be used for other purposes; ask your health care provider or pharmacist if you have questions. COMMON BRAND NAME(S): Keytruda What should I tell my care team  before I take this medication? They need to know if you have any of these conditions: autoimmune diseases like Crohn's disease, ulcerative colitis, or lupus have had or planning to have an allogeneic stem cell transplant (uses someone else's stem cells) history of organ  transplant history of chest radiation nervous system problems like myasthenia gravis or Guillain-Barre syndrome an unusual or allergic reaction to pembrolizumab, other medicines, foods, dyes, or preservatives pregnant or trying to get pregnant breast-feeding How should I use this medication? This medicine is for infusion into a vein. It is given by a health care professional in a hospital or clinic setting. A special MedGuide will be given to you before each treatment. Be sure to read this information carefully each time. Talk to your pediatrician regarding the use of this medicine in children. While this drug may be prescribed for children as young as 6 months for selected conditions, precautions do apply. Overdosage: If you think you have taken too much of this medicine contact a poison control center or emergency room at once. NOTE: This medicine is only for you. Do not share this medicine with others. What if I miss a dose? It is important not to miss your dose. Call your doctor or health care professional if you are unable to keep an appointment. What may interact with this medication? Interactions have not been studied. This list may not describe all possible interactions. Give your health care provider a list of all the medicines, herbs, non-prescription drugs, or dietary supplements you use. Also tell them if you smoke, drink alcohol, or use illegal drugs. Some items may interact with your medicine. What should I watch for while using this medication? Your condition will be monitored carefully while you are receiving this medicine. You may need blood work done while you are taking this medicine. Do not become pregnant while taking this medicine or for 4 months after stopping it. Women should inform their doctor if they wish to become pregnant or think they might be pregnant. There is a potential for serious side effects to an unborn child. Talk to your health care professional or  pharmacist for more information. Do not breast-feed an infant while taking this medicine or for 4 months after the last dose. What side effects may I notice from receiving this medication? Side effects that you should report to your doctor or health care professional as soon as possible: allergic reactions like skin rash, itching or hives, swelling of the face, lips, or tongue bloody or black, tarry breathing problems changes in vision chest pain chills confusion constipation cough diarrhea dizziness or feeling faint or lightheaded fast or irregular heartbeat fever flushing joint pain low blood counts - this medicine may decrease the number of white blood cells, red blood cells and platelets. You may be at increased risk for infections and bleeding. muscle pain muscle weakness pain, tingling, numbness in the hands or feet persistent headache redness, blistering, peeling or loosening of the skin, including inside the mouth signs and symptoms of high blood sugar such as dizziness; dry mouth; dry skin; fruity breath; nausea; stomach pain; increased hunger or thirst; increased urination signs and symptoms of kidney injury like trouble passing urine or change in the amount of urine signs and symptoms of liver injury like dark urine, light-colored stools, loss of appetite, nausea, right upper belly pain, yellowing of the eyes or skin sweating swollen lymph nodes weight loss Side effects that usually do not require medical attention (report to your doctor or  health care professional if they continue or are bothersome): decreased appetite hair loss tiredness This list may not describe all possible side effects. Call your doctor for medical advice about side effects. You may report side effects to FDA at 1-800-FDA-1088. Where should I keep my medication? This drug is given in a hospital or clinic and will not be stored at home. NOTE: This sheet is a summary. It may not cover all possible  information. If you have questions about this medicine, talk to your doctor, pharmacist, or health care provider.  2022 Elsevier/Gold Standard (2019-03-18 21:44:53)

## 2021-02-16 NOTE — Progress Notes (Signed)
Pt c/o sacral pain from sores that he got in hospital. Pt requesting refills on xanax and flexaril. Also requesting handicap placard

## 2021-02-17 ENCOUNTER — Ambulatory Visit: Payer: Medicare HMO | Admitting: Radiation Oncology

## 2021-02-17 LAB — T4: T4, Total: 5 ug/dL (ref 4.5–12.0)

## 2021-02-21 ENCOUNTER — Other Ambulatory Visit: Payer: Self-pay

## 2021-02-21 ENCOUNTER — Other Ambulatory Visit: Payer: Self-pay | Admitting: Primary Care

## 2021-02-21 VITALS — Ht 65.0 in | Wt 108.0 lb

## 2021-02-21 DIAGNOSIS — G8929 Other chronic pain: Secondary | ICD-10-CM

## 2021-02-21 DIAGNOSIS — M792 Neuralgia and neuritis, unspecified: Secondary | ICD-10-CM

## 2021-02-21 DIAGNOSIS — R296 Repeated falls: Secondary | ICD-10-CM

## 2021-02-21 DIAGNOSIS — C3492 Malignant neoplasm of unspecified part of left bronchus or lung: Secondary | ICD-10-CM

## 2021-02-21 DIAGNOSIS — M5441 Lumbago with sciatica, right side: Secondary | ICD-10-CM

## 2021-02-21 NOTE — Progress Notes (Addendum)
[facility] Consult Note Telephone: [phone]  Fax: [phone]   Date of encounter: 02/21/21 10:46 AM PATIENT NAME: Donald Lowery [address] [place] [id number]   [phone] (home)  DOB: December 17, 1950 MRN: [id number] PRIMARY CARE PROVIDER:    [name], NP,  [address] [place] [address] [phone]  REFERRING PROVIDER:   Irean Hong, NP [place],  [place] [address] [phone]   RESPONSIBLE PARTY:    Contact Information     Name Relation Home Work [place] Spouse [phone]  [phone]   [facility] Daughter   [phone]   Kristie Cowman   [phone]        I met face to face with patient and family in  home. Palliative Care was asked to follow this patient by consultation request of  Borders, [name], NP  to address advance care planning and complex medical decision making. This is the initial visit.                                     ASSESSMENT AND PLAN / RECOMMENDATIONS:   Advance Care Planning/Goals of Care: Goals include to maximize quality of life and symptom management. Patient/health care surrogate gave his/her permission to discuss.Our advance care planning conversation included a discussion about:    The value and importance of advance care planning  Exploration of personal, cultural or spiritual beliefs that might influence medical decisions  Exploration of goals of care in the event of a sudden injury or illness  Identification of a healthcare agent - wife Sunday Spillers  creation of an  advance directive document - DNR and MOST Decision not to resuscitate  due to poor prognosis. CODE STATUS: DNR I completed a MOST form today. The patient and family outlined their wishes for the following treatment decisions:  Cardiopulmonary Resuscitation: Do Not Attempt Resuscitation (DNR/No CPR)  Medical Interventions: Limited Additional  Interventions: Use medical treatment, IV fluids and cardiac monitoring as indicated, DO NOT USE intubation or mechanical ventilation. May consider use of less invasive airway support such as BiPAP or CPAP. Also provide comfort measures. Transfer to the hospital if indicated. Avoid intensive care.   Antibiotics: Antibiotics if indicated  IV Fluids: IV fluids for a defined trial period  Feeding Tube: No feeding tube   We discussed his advance directives. Wife states that he has made these up already and that they are on file at the hospital. We discussed his quality of life wishes and he stated he wanted to be more mobile, that was his goal. He speaks of taking cancer treatments for the next one to two years. He endorsed not wanting to be resuscitated so I have left a goldenrod DNR in the home. We  also completed a most form which is also uploaded into [facility].   I spent 20 minutes providing this consultation. More than 50% of the time in this consultation was spent in counseling and care coordination. _________________________________________________________________________  Symptom Management/Plan:  Pain: We discussed his pain, he endorsed nine of 10 on my arrival. He had just had his medicines which included 1 mg of Hydromorphone. By the end of the interview he stated it was maybe an eight/10. His wife stated that he should not take more pain medicine because it would be too much. We discussed pain as a symptom to aggressively manage  and how it would impact quality of life including eating , sleeping, resting and mobility. I encouraged her to give the Dilaudid 1 to 2 mg every four hours as needed and I or oncology can  increase the fentanyl patch to better pain management.   We did revisit the morphine and oxycodone briefly. He stated that he cannot tolerate high amounts of Morphine  ER and so I asked her to put morphine and oxycodone medicines away. I reiterated to only use the fentanyl patch and the  Dilaudid several times in our interview.  She did make notes to this effect. I also gave a good RX card as wife endorses difficulty to pay for meds.   Mobility is poor; patient would like to ambulate but endorses too much pain and weakness. We discussed the weakness that pain brings but also the cancer process and weakening from that. He has  has a conventional walker and a Rollator and is currently being seen by [facility] PT. He also asked for a wheelchair to be able to ambulate in the house and go out for short distances. He is not able to ambulate safely more than 50 feet and does have the upper body strength to self  mobilize at this point. I will order a self-propelled small lightweight wheelchair for him to use in the house and for going out to appointments. He also needs foot rests for going out.  Imaging shows bone mets.  Nutrition: patient is cachectic weighs 108 lb and six months ago weighed 160 lb approximately. This is a 32% weight loss. Albumin 2.9. Body mass index is 17.97 kg/m.   Follow up Palliative Care Visit: Palliative care will continue to follow for complex medical decision making, advance care planning, and clarification of goals. Return  1 week for TM assessment of pain and in person 4-6 weeks or prn.  This visit was coded based on medical decision making (MDM).  PPS: 40%  HOSPICE ELIGIBILITY/DIAGNOSIS: yes with concordant goals of care/lung cancer  Chief Complaint: fatigue, pain  HISTORY OF PRESENT ILLNESS:  Donald Lowery is a 70 y.o. year old male  with lung cancer with metastatic disease. He endorses 9/10 pain this am a few minutes after taking hydromorphone 1 mg. He also has 50 mcg fentanyl patch ongoing. Pain is in sacrum and R hip area, constant and increases to 10/10 and least is 7/10. This impacts his mobility and rest. He endorses trying to sleep more to escape pain. He would like this better managed. [name]   History obtained from review of EMR, discussion with primary  team, and interview with family, facility staff/caregiver and/or Mr. Beltre.  I reviewed available labs, medications, imaging, studies and related documents from the EMR.  Records reviewed and summarized above.   ROS   General: NAD ENMT: denies dysphagia Cardiovascular: denies chest pain, denies DOE Pulmonary: denies cough, denies increased SOB Abdomen: endorses fair appetite, denies constipation, endorses continence of bowel GU: denies dysuria, endorses continence of urine MSK:  endorses weakness,  + falls reported Skin: endorses sacral stage 2 PI Neurological: endorses 9-10/10 pain, denies insomnia Psych: Endorses depressed mood Heme/lymph/immuno: denies bruises, abnormal bleeding  Physical Exam: Current and past weights: 108 lbs, 168 lbs 6 months ago, 32 % loss Constitutional: NAD General: frail appearing, thin EYES: anicteric sclera, lids intact, no discharge  ENMT: decreased hearing, oral mucous membranes moist CV: S1S2, RRR, no LE edema Pulmonary: LCTA, no increased work of breathing, no cough, room air Abdomen: intake  50%, normo-active BS + 4 quadrants, soft and non tender, no ascites GU: deferred MSK: ++sarcopenia, moves all extremities, ambulatory with walker short distances Skin: warm and dry, no rashes or wounds on visible skin Neuro:  + generalized weakness,  mild  cognitive impairment Psych: anxious affect, A and O x 3 Hem/lymph/immuno: no widespread bruising CURRENT PROBLEM LIST:  Patient Active Problem List   Diagnosis Date Noted   Hyponatremia    Multiple falls    Palliative care encounter    Right hip pain 01/21/2021   Bilateral low back pain with bilateral sciatica    Surgery, elective    Intractable low back pain    Protein-calorie malnutrition, severe 01/18/2021   Pressure injury of skin 01/14/2021   Intractable pain 01/13/2021   Intractable back pain 01/12/2021   Radicular pain of shoulder (Left) 11/29/2020   Shoulder blade pain (Left) 11/29/2020    Cervicalgia (Left) 11/29/2020   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   Hypercalcemia of malignancy 11/10/2020   Hypertension 10/05/2020   Hyperlipidemia 10/05/2020   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   Cancer related pain 09/21/2020   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/21/2020   Type 2 diabetes mellitus without complication, without long-term current use of insulin ([facility]) 05/31/2020   Aortic atherosclerosis ([facility]) 02/10/2019   CAD (coronary artery disease) 05/16/2018   NSTEMI (non-ST elevated myocardial infarction) ([facility]) 05/15/2018   Emphysema of lung ([facility]) 05/15/2018   PAST MEDICAL HISTORY:  Active Ambulatory Problems    Diagnosis Date Noted   NSTEMI (non-ST elevated myocardial infarction) ([facility]) 05/15/2018   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/21/2020   Type 2 diabetes mellitus without complication, without long-term current use of insulin ([facility]) 05/31/2020   Hypertension 10/05/2020   Hyperlipidemia 10/05/2020   Emphysema of lung ([facility]) 05/15/2018   CAD (coronary artery disease) 05/16/2018   Aortic atherosclerosis ([facility]) 02/10/2019   Hypercalcemia of malignancy 11/10/2020   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   Cancer related pain 09/21/2020   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/21/2020   Radicular pain of shoulder (Left) 11/29/2020   Shoulder blade pain (Left) 11/29/2020   Cervicalgia (Left) 11/29/2020   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   Intractable back pain 01/12/2021   Intractable pain 01/13/2021   Pressure injury of skin 01/14/2021   Protein-calorie malnutrition, severe 01/18/2021   Bilateral low back pain with bilateral sciatica    Surgery, elective    Intractable low back pain    Right hip pain 01/21/2021   Palliative care encounter    Multiple falls    Hyponatremia    Resolved Ambulatory Problems    Diagnosis Date Noted   No Resolved Ambulatory Problems   Past Medical History:  Diagnosis Date    [place]k hernia 09/21/2020   Coronary artery disease    Hepatic steatosis    History of 2019 novel coronavirus disease (COVID-19) 10/12/2020   History of echocardiogram    Paraseptal emphysema (HCC)    T2DM (type 2 diabetes mellitus) ([facility])    Tobacco abuse    SOCIAL HX:  Social History   Tobacco Use   Smoking status: Former    Packs/day: 1.00    Years: 45.00    Pack years: 45.00    Types: Cigarettes    Quit date: 05/15/2018    Years since quitting: 2.7   Smokeless tobacco: Never  Substance Use Topics   Alcohol use: Never   FAMILY HX:  Family  History  Problem Relation Age of Onset   Heart Problems Mother    Heart attack Father    Diabetes Brother    Heart Problems Brother       ALLERGIES: No Known Allergies   PERTINENT MEDICATIONS:  Outpatient Encounter Medications as of 02/21/2021  Medication Sig   acetaminophen (TYLENOL) 500 MG tablet Take 2 tablets (1,000 mg total) by mouth 3 (three) times daily.   ALPRAZolam (XANAX) 0.25 MG tablet Take 1 tablet (0.25 mg total) by mouth 2 (two) times daily as needed for anxiety.   aspirin EC 81 MG EC tablet Take 1 tablet (81 mg total) by mouth daily.   cyclobenzaprine (FLEXERIL) 5 MG tablet Take 1 tablet (5 mg total) by mouth 3 (three) times daily as needed for muscle spasms.   dexamethasone (DECADRON) 4 MG tablet Take 1 tablet (4 mg total) by mouth every 12 (twelve) hours.   DULoxetine (CYMBALTA) 30 MG capsule Take 1 capsule (30 mg total) by mouth daily.   ezetimibe (ZETIA) 10 MG tablet Take 1 tablet (10 mg total) by mouth daily.   feeding supplement (ENSURE ENLIVE / ENSURE PLUS) LIQD Take 237 mLs by mouth 3 (three) times daily between meals.   fentaNYL (DURAGESIC) 50 MCG/HR Place 1 patch onto the skin every 3 (three) days.   gabapentin (NEURONTIN) 600 MG tablet Take 1 tablet (600 mg total) by mouth 3 (three) times daily.   HYDROmorphone (DILAUDID) 4 MG tablet Take 1-1.5 tablets (4-6 mg total) by mouth every 4 (four) hours as  needed for severe pain.   isosorbide mononitrate (IMDUR) 30 MG 24 hr tablet Take 1 tablet (30 mg total) by mouth daily.   metFORMIN (GLUCOPHAGE) 500 MG tablet Take 1 tablet (500 mg total) by mouth 2 (two) times daily with a meal.   methocarbamol (ROBAXIN) 750 MG tablet Take 1 tablet (750 mg total) by mouth 4 (four) times daily.   metoprolol tartrate (LOPRESSOR) 25 MG tablet Take 1 tablet (25 mg total) by mouth 2 (two) times daily.   sucralfate (CARAFATE) 1 g tablet Take 0.5 tablets (0.5 g total) by mouth 3 (three) times daily before meals.   traZODone (DESYREL) 50 MG tablet Take 0.5 tablets (25 mg total) by mouth at bedtime as needed for sleep.   vitamin B-12 (CYANOCOBALAMIN) 1000 MCG tablet Take 1,000 mcg by mouth daily.   dicyclomine (BENTYL) 10 MG capsule Take 1 capsule (10 mg total) by mouth 3 (three) times daily as needed for up to 14 days for spasms.   icosapent Ethyl (VASCEPA) 1 g capsule Take 2 capsules (2 g total) by mouth 2 (two) times daily.   Menthol-Methyl Salicylate (ICY HOT) 14-48 % STCK Apply 1 application topically daily as needed (shoulder pain). (Patient not taking: Reported on 02/21/2021)   naloxone [facility]) 2 MG/2ML injection Inject 0.4 mLs (0.4 mg total) into the vein as needed. (Patient not taking: Reported on 02/21/2021)   polyethylene glycol (MIRALAX / GLYCOLAX) 17 g packet Take 17 g by mouth daily. (Patient not taking: Reported on 02/21/2021)   [DISCONTINUED] prochlorperazine (COMPAZINE) 10 MG tablet Take 1 tablet (10 mg total) by mouth every 6 (six) hours as needed (Nausea or vomiting). (Patient not taking: No sig reported)   No facility-administered encounter medications on file as of 02/21/2021.   Thank you for the opportunity to participate in the care of Mr. Zufall.  The palliative care team will continue to follow. Please call our office at [phone] if we can be of additional  assistance.   Jason Coop, NP , DNP, AGPCNP-BC  COVID-19 PATIENT SCREENING  TOOL Asked and negative response unless otherwise noted:  Have you had symptoms of covid, tested positive or been in contact with someone with symptoms/positive test in the past 5-10 days?

## 2021-02-24 ENCOUNTER — Telehealth: Payer: Self-pay | Admitting: Primary Care

## 2021-02-24 NOTE — Telephone Encounter (Signed)
Family called patient still having 10 / 10 pain prior to PRN dosing. Patient wife states she has been giving 1.5 pill (6 mg)  of Dilaudid every four hours during the day. Patient states he is still in much pain. I instructed  wife to give 2 pills or 8 mg of Dilaudid every four hours during wake time. They still have a large quantity of oxycodone from a previous regimen. This seems to have been well tolerated so I will discuss on Monday with her at a Telemed meeting for further dosing. I will consider increasing fentanyl dose as well.  Patient apparently did not tolerate morphine and I have asked patient's wife to dispose of at a recognized disposal facility.

## 2021-02-27 ENCOUNTER — Other Ambulatory Visit: Payer: Self-pay | Admitting: Primary Care

## 2021-02-27 ENCOUNTER — Other Ambulatory Visit: Payer: Self-pay | Admitting: *Deleted

## 2021-02-27 ENCOUNTER — Other Ambulatory Visit: Payer: Self-pay

## 2021-02-27 DIAGNOSIS — M5441 Lumbago with sciatica, right side: Secondary | ICD-10-CM

## 2021-02-27 DIAGNOSIS — C3492 Malignant neoplasm of unspecified part of left bronchus or lung: Secondary | ICD-10-CM

## 2021-02-27 DIAGNOSIS — G8929 Other chronic pain: Secondary | ICD-10-CM

## 2021-02-27 DIAGNOSIS — M5442 Lumbago with sciatica, left side: Secondary | ICD-10-CM

## 2021-02-27 DIAGNOSIS — M549 Dorsalgia, unspecified: Secondary | ICD-10-CM

## 2021-02-27 DIAGNOSIS — Z515 Encounter for palliative care: Secondary | ICD-10-CM

## 2021-02-27 NOTE — Progress Notes (Signed)
[facility] Palliative Care Consult Note Telephone: [phone]  Fax: [phone]   Due to the COVID-19 crisis, this visit was done via telemedicine from my office and it was initiated and consent by this patient and or family.  I connected with  Donald Lowery on 02/27/21 by a video enabled telemedicine application and verified that I am speaking with the correct person using two identifiers.   I discussed the limitations of evaluation and management by telemedicine. The patient expressed understanding and agreed to proceed.  Date of encounter: 02/27/21 10:28 AM PATIENT NAME: Donald Lowery [id number]   [phone] (home)  DOB: Mar 27, 1951 MRN: [id number] PRIMARY CARE PROVIDER:    [name], NP,  [place] [address] [phone]  REFERRING PROVIDER:   Irean Hong, NP [place],  [place] [address] [phone]   RESPONSIBLE PARTY:    Contact Information     Name Relation Home Work [place] [phone]  [phone]   [facility] Daughter   [phone]   Kristie Cowman   [phone]       Palliative Care was asked to follow this patient by consultation request of  Borders, [name], NPto address advance care planning and complex medical decision making. This is a follow up visit.                                   ASSESSMENT AND PLAN / RECOMMENDATIONS:   Advance Care Planning/Goals of Care: Goals include to maximize quality of life and symptom management.   Symptom Management/Plan:  Pain management: Has had some good days and bad days. Has had some adequate responses. Recommend along with consultation with J Borders NP to increase to 4-8 mg dilaudid q 4 hrs po prn pain, and keep fentanyl at 50 mcg for now. Will increase if indicated after more assessment of pain control on increased dilaudid.   Also wife requests refill  of cyclobenziprine 5 mg po tid #90 and metoprolol 25 mg po bid #60.I have asked her to make appt with PCP Rocco Serene, NP to follow up with routine meds.  I will refill for now, with 1 refill, each, to [place].   I will refill fentanyl 94mcg q 72 hrs topically, #10, to [facility] as I gave her a good RX coupon for use there. Refilled hydromorphone 4 mg 1-2 tabs every 4-6 hrs po prn pain, #90 to [facility].   Patient will see J Borders on 11/10 and I will f/u with HV on 03/20/21 for further pain assessment. Instructed wife to call for any concerns. Has had narcan ordered.   Nutrition:Wife reports good appetite and intake, eating 3 means a day and good hydration.  Mobility: patient needs w/c for long distances. Has been able to be comfortable in the home and positions self for comfort.  Follow up Palliative Care Visit: Palliative care will continue to follow for complex medical decision making, advance care planning, and clarification of goals. Return 3-4 weeks or prn.  I spent 60 minutes providing this consultation. More than 50% of the time in this consultation was spent in counseling and care coordination.  PPS: 40%  HOSPICE ELIGIBILITY/DIAGNOSIS: TBD  Chief Complaint: pain  HISTORY OF PRESENT ILLNESS:  Donald Lowery is a 70 y.o. year old male  with pain in R hip, lung cancer . Assessment today to modify pain medication protocol. Has used 8 mg of dilaudid qid over weekend with improved pain management.States constant pain in R hip ranges from 8-10/10, improved with medications.  History obtained from review of EMR, discussion with primary team, and interview with family, facility staff/caregiver and/or Donald Lowery.  I reviewed available labs, medications, imaging, studies and related documents from the EMR.  Records reviewed and summarized above.   ROS  General: NAD Cardiovascular: denies chest pain, denies DOE Pulmonary: denies cough, denies increased  SOB Abdomen: endorses good appetite, denies constipation, endorses continence of bowel GU: denies dysuria, endorses continence of urine MSK:  endorses weakness,  no falls reported Skin: denies rashes or wounds Neurological: endorses pain, denies insomnia Psych: Endorses positive mood Heme/lymph/immuno: denies bruises, abnormal bleeding  Physical Exam:deferred  Thank you for the opportunity to participate in the care of Donald Lowery.  The palliative care team will continue to follow. Please call our office at [phone] if we can be of additional assistance.   Jason Coop, NP DNP, AGPCNP-BC  COVID-19 PATIENT SCREENING TOOL Asked and negative response unless otherwise noted:   Have you had symptoms of covid, tested positive or been in contact with someone with symptoms/positive test in the past 5-10 days?

## 2021-02-28 ENCOUNTER — Telehealth: Payer: Self-pay | Admitting: Primary Care

## 2021-02-28 ENCOUNTER — Other Ambulatory Visit: Payer: Self-pay | Admitting: *Deleted

## 2021-02-28 NOTE — Telephone Encounter (Signed)
Call from wife RE the auth for cyclobenzaprine. Discussed my doing auth or they may use good Rx. Will f/u with auth.[name]

## 2021-03-01 ENCOUNTER — Encounter: Payer: Self-pay | Admitting: Oncology

## 2021-03-01 ENCOUNTER — Other Ambulatory Visit: Payer: Self-pay | Admitting: *Deleted

## 2021-03-01 ENCOUNTER — Encounter: Payer: Self-pay | Admitting: Nurse Practitioner

## 2021-03-01 DIAGNOSIS — M5441 Lumbago with sciatica, right side: Secondary | ICD-10-CM

## 2021-03-02 ENCOUNTER — Encounter: Payer: Self-pay | Admitting: Oncology

## 2021-03-02 ENCOUNTER — Encounter: Payer: Self-pay | Admitting: Nurse Practitioner

## 2021-03-02 MED ORDER — DEXAMETHASONE 2 MG PO TABS: ORAL_TABLET | ORAL | 0 refills | Status: AC

## 2021-03-02 NOTE — Progress Notes (Deleted)
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  INTERVAL HISTORY: Patient returns to clinic today for further evaluation, hospital follow-up, and initiation of maintenance Keytruda.  His pain is significantly better controlled after completing XRT.  He has mild sacral pain secondary to bedsores.  He continues to have increased weakness and fatigue, but states this is getting better and has home physical therapy.  He has no neurologic complaints.  He denies any recent fevers or illnesses.  He has a fair appetite and denies weight loss.  He denies any chest pain, shortness of breath, cough, or hemoptysis.  He denies any abdominal pain.  He has no nausea, vomiting, constipation, or diarrhea.  He has no urinary complaints.  Patient offers no further specific complaints today.  REVIEW OF SYSTEMS:   Review of Systems  Constitutional: Negative.  Negative for fever, malaise/fatigue and weight loss.  Respiratory: Negative.  Negative for cough, hemoptysis and shortness of breath.   Cardiovascular: Negative.  Negative for chest pain and leg swelling.  Gastrointestinal:  Negative for abdominal pain.  Genitourinary: Negative.  Negative for dysuria.  Musculoskeletal:  Positive for joint pain. Negative for back pain.  Skin: Negative.  Negative for rash.  Neurological: Negative.  Negative for dizziness, focal weakness, weakness and headaches.   As per HPI. Otherwise, a complete review of systems is  negative.  PAST MEDICAL HISTORY: Past Medical History:  Diagnosis Date   Aortic atherosclerosis ([place])    Bochdalek hernia 09/21/2020   fatty   Coronary artery disease    a. 04/2018 NSTEMI/Cath: LM nl, LAD 50p, 87md diffuse-small caliber, LCX heavily Ca2+ sev prox/mid dzs, RCA 90 diff distal dzs into RPL and RPDA. EF 50-55%-->Med Rx.   Hepatic steatosis    History of 2019 novel coronavirus disease (COVID-19) 10/12/2020   History of echocardiogram    a. 04/2018 Echo: EF 55-60%, no rwma, mild MR, mildly dil LA. Nl RV fxn.   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 Quit 04/2018.    PAST SURGICAL HISTORY: Past Surgical History:  Procedure Laterality Date   BRAIN SURGERY     CARDIAC CATHETERIZATION     IR IMAGING GUIDED PORT INSERTION  10/28/2020   KYPHOPLASTY N/A 01/17/2021   Procedure: KClaybon JabsRFA;  Surgeon: MHessie Knows MD;  Location: [facility] ORS;  Service: Orthopedics;  Laterality: N/A;   LEFT HEART CATH AND CORONARY ANGIOGRAPHY N/A 05/16/2018   Procedure: LEFT HEART CATH AND CORONARY ANGIOGRAPHY poss PCI;  Surgeon: GMinna Merritts MD;  [facility];  Service: Cardiovascular;  Laterality: N/A;   VIDEO BRONCHOSCOPY WITH ENDOBRONCHIAL NAVIGATION N/A 10/24/2020   Procedure: ROBOTIC ASSISTED VIDEO BRONCHOSCOPY WITH ENDOBRONCHIAL NAVIGATION;  Surgeon: GTyler Pita MD;  Location: [facility] ORS;  Service: Pulmonary;  Laterality: N/A;    FAMILY HISTORY: Family History  Problem Relation Age of Onset   Heart Problems Mother    Heart attack Father    Diabetes Brother    Heart Problems Brother     ADVANCED DIRECTIVES (Y/N):  N  HEALTH MAINTENANCE: Social History   Tobacco Use   Smoking  status: Former    Packs/day: 1.00    Years: 45.00    Pack years: 45.00    Types: Cigarettes    Quit date: 05/15/2018    Years since quitting: 2.8   Smokeless tobacco: Never  Vaping Use   Vaping Use: Never used  Substance Use Topics   Alcohol use: Never   Drug use: Never     Colonoscopy:  PAP:  Bone density:  Lipid panel:  No Known Allergies  Current Outpatient Medications  Medication Sig Dispense Refill   acetaminophen (TYLENOL) 500 MG tablet Take 2 tablets (1,000 mg total) by mouth 3 (three) times daily. 30 tablet 0   ALPRAZolam (XANAX) 0.25 MG tablet Take 1 tablet (0.25 mg total) by mouth 2 (two) times daily as needed for anxiety. 30 tablet 0   aspirin EC 81 MG EC tablet Take 1 tablet (81 mg total) by mouth daily. 30 tablet 0   cyclobenzaprine (FLEXERIL) 5 MG tablet Take 1 tablet (5 mg total) by mouth 3 (three) times daily as needed for muscle spasms. 30 tablet 0   dexamethasone (DECADRON) 2 MG tablet Take 1 tablet (36m) by mouth daily x 1 week, then take 1/2 tablet (1103m by mouth daily x 1 week, Then stop 15 tablet 0   dicyclomine (BENTYL) 10 MG capsule Take 1 capsule (10 mg total) by mouth 3 (three) times daily as needed for up to 14 days for spasms. 20 capsule 0   DULoxetine (CYMBALTA) 30 MG capsule Take 1 capsule (30 mg total) by mouth daily. 30 capsule 3   ezetimibe (ZETIA) 10 MG tablet Take 1 tablet (10 mg total) by mouth daily. 90 tablet 3   feeding supplement (ENSURE ENLIVE / ENSURE PLUS) LIQD Take 237 mLs by mouth 3 (three) times daily between meals. 237 mL 12   fentaNYL (DURAGESIC) 50 MCG/HR Place 1 patch onto the skin every 3 (three) days. 5 patch 0   gabapentin (NEURONTIN) 600 MG tablet Take 1 tablet (600 mg total) by mouth 3 (three) times daily. 90 tablet 1   HYDROmorphone (DILAUDID) 4 MG tablet Take 1-1.5 tablets (4-6 mg total) by mouth every 4 (four) hours as needed for severe pain. 60 tablet 0   icosapent Ethyl (VASCEPA) 1 g capsule Take 2 capsules (2 g total) by  mouth 2 (two) times daily. 120 capsule 6   isosorbide mononitrate (IMDUR) 30 MG 24 hr tablet Take 1 tablet (30 mg total) by mouth daily. 90 tablet 3   Menthol-Methyl Salicylate (ICY HOT) 1094-85 STCK Apply 1 application topically daily as needed (shoulder pain). (Patient not taking: Reported on 02/21/2021)     metFORMIN (GLUCOPHAGE) 500 MG tablet Take 1 tablet (500 mg total) by mouth 2 (two) times daily with a meal. 180 tablet 1   methocarbamol (ROBAXIN) 750 MG tablet Take 1 tablet (750 mg total) by mouth 4 (four) times daily. 90 tablet 1   metoprolol tartrate (LOPRESSOR) 25 MG tablet Take 1 tablet (25 mg total) by mouth 2 (two) times daily. 60 tablet 0   naloxone (NARCAN) 2 MG/2ML injection Inject 0.4 mLs (0.4 mg total) into the vein as needed. (Patient not  taking: Reported on 02/21/2021) 2 mL 0   polyethylene glycol (MIRALAX / GLYCOLAX) 17 g packet Take 17 g by mouth daily. (Patient not taking: Reported on 02/21/2021) 14 each 0   sucralfate (CARAFATE) 1 g tablet Take 0.5 tablets (0.5 g total) by mouth 3 (three) times daily before meals. 60 tablet 5   traZODone (DESYREL) 50 MG tablet Take 0.5 tablets (25 mg total) by mouth at bedtime as needed for sleep. 30 tablet 0   vitamin B-12 (CYANOCOBALAMIN) 1000 MCG tablet Take 1,000 mcg by mouth daily.     No current facility-administered medications for this visit.    OBJECTIVE: There were no vitals filed for this visit.    There is no height or weight on file to calculate BMI.    ECOG FS:2 - Symptomatic, <50% confined to bed  General: Thin, no acute distress.  Sitting in a wheelchair. Eyes: Pink conjunctiva, anicteric sclera. HEENT: Normocephalic, moist mucous membranes. Lungs: No audible wheezing or coughing. Heart: Regular rate and rhythm. Abdomen: Soft, nontender, no obvious distention. Musculoskeletal: No edema, cyanosis, or clubbing. Neuro: Alert, answering all questions appropriately. Cranial nerves grossly intact. Skin: No rashes or  petechiae noted. Psych: Normal affect.   LAB RESULTS:  Lab Results  Component Value Date   NA 129 (L) 02/16/2021   K 4.7 02/16/2021   CL 92 (L) 02/16/2021   CO2 26 02/16/2021   GLUCOSE 170 (H) 02/16/2021   BUN 29 (H) 02/16/2021   CREATININE 0.65 02/16/2021   CALCIUM 9.5 02/16/2021   PROT 6.5 02/16/2021   ALBUMIN 2.9 (L) 02/16/2021   AST 17 02/16/2021   ALT 21 02/16/2021   ALKPHOS 86 02/16/2021   BILITOT 0.6 02/16/2021   GFRNONAA >60 02/16/2021   GFRAA >60 07/14/2019    Lab Results  Component Value Date   WBC 15.9 (H) 02/16/2021   NEUTROABS 13.5 (H) 02/16/2021   HGB 10.3 (L) 02/16/2021   HCT 31.3 (L) 02/16/2021   MCV 92.1 02/16/2021   PLT 264 02/16/2021     STUDIES: NM Bone Scan Whole Body  Result Date: 02/02/2021 CLINICAL DATA:  Lung cancer, osseous metastatic disease, staging examination EXAM: NUCLEAR MEDICINE WHOLE BODY BONE SCAN TECHNIQUE: Whole body anterior and posterior images were obtained approximately 3 hours after intravenous injection of radiopharmaceutical. RADIOPHARMACEUTICALS:  21.7 mCi Technetium-16mMDP IV COMPARISON:  PET CT 12/12/2020 FINDINGS: There is a mixed lesion is seen within the L3 vertebral body corresponding to the pathologic fracture better seen on prior PET CT examination and superimposed methylmethacrylate related to vertebral augmentation noted on PET CT CT examination of 01/21/2021. The known metastasis within the left iliac crest represents a a cold lesion with surrounding osteoblastic activity. Focal uptake within the a mid body of the sternum is suspicious for an osseous metastasis in this location. Normal uptake and excretion within uptake within the sternoclavicular joints, shoulders, cervicothoracic spine are likely degenerative in nature. Normal soft tissue distribution. The kidneys and bladder. IMPRESSION: Uptake related to known pathologic fracture within the L3 vertebral body with probable postprocedural changes of vertebral  augmentation and left iliac crest metastasis. 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 This could be better assessed with dedicated CT examination of the chest. Electronically Signed   By: AFidela SalisburyM.D.   On: 02/02/2021 15:22   MR HIP RIGHT WO CONTRAST  Result Date: 02/01/2021 CLINICAL DATA:  Severe right hip pain.  History of lung cancer. EXAM: MR OF THE RIGHT HIP WITHOUT CONTRAST TECHNIQUE: Multiplanar, multisequence MR imaging was performed. No  intravenous contrast was administered. COMPARISON:  01/13/2021 FINDINGS: Bones: No hip fracture, dislocation or avascular necrosis. Interval enlargement of a 4.4 x 4.5 cm soft tissue mass with bone destruction involving the left iliac crest with surrounding bone marrow edema. Mild edema in the adjacent iliacus muscle which may related to radiation changes. 20 mm osseous metastatic disease in the right iliac crest without a soft tissue component. 8 mm osseous metastatic disease in the S1 vertebral body. Normal sacrum and sacroiliac joints. No SI joint widening or erosive changes. Mild lower lumbar spine spondylosis. Articular cartilage and labrum Articular cartilage:  No chondral defect. Labrum: Grossly intact, but evaluation is limited by lack of intraarticular fluid. Joint or bursal effusion Joint effusion:  No hip joint effusion.  No SI joint effusion. Bursae:  No bursa formation. Muscles and tendons Flexors: Muscle edema in the iliacus muscle bilaterally and right psoas muscle which may reflect myositis versus muscle strain. Extensors: Normal. Abductors: Normal. Adductors: Muscle edema in the adductor musculature bilaterally concerning for mild muscle strain versus myositis. Gluteals: Small partial tear of the periphery of the right gluteus minimus musculotendinous junction. Hamstrings: Normal. Other findings No pelvic free fluid. No fluid collection or hematoma. No inguinal lymphadenopathy. No inguinal hernia. IMPRESSION: 1. Interval enlargement of the left iliac crest  soft tissue mass currently measuring 4.4 x 4.5 cm. Interval enlargement of the right iliac crest osseous metastatic disease measuring 20 mm. Interval enlargement of the S1 vertebral body focus of osseous metastatic disease. Overall appearance is consistent with progression of metastatic disease. 2. No hip fracture, dislocation or avascular necrosis. 3. Muscle edema in the iliacus muscle bilaterally and right psoas muscle as well as bilateral adductor musculature. Differential considerations include mild muscle strain versus myositis secondary to radiation therapy. 4. Small partial tear of the periphery of the right gluteus minimus musculotendinous junction. Electronically Signed   By: Kathreen Devoid M.D.   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  PD-L1 5%.  PLAN:    1. 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  PET scan results from October 04, 2020 reviewed independently with hypermetabolic bilateral pulmonary nodules without mediastinal or hilar lymphadenopathy.  Although imaging suggests stage IV disease, patient was initially treated as a stage IIIa.   MRI of the brain on October 08, 2020 was negative for disease.  Repeat PET scan results from December 13, 2020 reviewed independently with improvement of hypermetabolic left apical mass as well as bilateral pulmonary nodules, patient noted to have a new hypermetabolic lytic lesion in his left iliac crest.  Patient has now completed all of his XRT with significant improvement of his pain.  He completed carboplatinum and Taxol on January 11, 2021.  Patient's PDL-1 is 5%, therefore will now proceed with cycle 1 of maintenance Keytruda every 21 days.  Return to clinic in 3 weeks for further evaluation and consideration of cycle 2.    2.  Pain: Well controlled after completion of XRT and on current narcotic regimen.  Continue current treatment.  Will arrange  follow-up with palliative care in 3 weeks.   3.  Weight loss: Weight has stabilized.  Appreciate dietary input. 4.  Hyponatremia: Chronic and unchanged.  Patient's sodium is 129 today.   5.  Leukocytosis: Likely reactive, monitor. 6.  Anemia: Hemoglobin continues to trend up and is now 10.3. 7.  Transaminitis: Resolved. 8.  Hypokalemia: Resolved.  Patient expressed understanding and was in agreement with this plan. He also understands that He can call clinic at any time with any questions, concerns, or complaints.   Cancer Staging Non-small cell cancer of left lung [facility]) Staging form: Lung, AJCC 8th Edition - Clinical stage from 10/28/2020: Stage IVA (cT4, cN0, pM1a) - Signed by Lloyd Huger, MD on 10/28/2020  Lloyd Huger, MD   03/02/2021 5:04 PM

## 2021-03-06 ENCOUNTER — Ambulatory Visit
Admission: RE | Admit: 2021-03-06 | Discharge: 2021-03-06 | Disposition: A | Discharge status: Home / Self Care | Payer: Medicare HMO | Source: Ambulatory Visit | Attending: Radiation Oncology | Admitting: Radiation Oncology

## 2021-03-06 ENCOUNTER — Other Ambulatory Visit: Payer: Self-pay

## 2021-03-06 ENCOUNTER — Encounter: Payer: Self-pay | Admitting: Radiation Oncology

## 2021-03-06 ENCOUNTER — Telehealth: Payer: Self-pay | Admitting: Primary Care

## 2021-03-06 DIAGNOSIS — Z923 Personal history of irradiation: Secondary | ICD-10-CM | POA: Diagnosis not present

## 2021-03-06 DIAGNOSIS — C3412 Malignant neoplasm of upper lobe, left bronchus or lung: Secondary | ICD-10-CM

## 2021-03-06 DIAGNOSIS — C7951 Secondary malignant neoplasm of bone: Secondary | ICD-10-CM | POA: Diagnosis not present

## 2021-03-06 NOTE — Progress Notes (Signed)
Radiation Oncology Follow up Note  Name: Donald Lowery   Date:   03/06/2021 MRN:  [id number] DOB: 08-29-1950    This 70 y.o. male presents to the clinic today for 1 month follow-up status post multiple sites including 6 lumbar spine pelvis as well as chest for stage IVa (T4N0M1A squamous cell carcinoma the left upper lobe.  REFERRING PROVIDER: [name], *  HPI: Patient is a 70 year old male now at 1 month of completed palliative radiation therapy to both his chest left iliac crest as well as lumbar spine and patient with known stage IVa squamous cell carcinoma of the left upper lobe.  He is seen today in describes significant pain on his right side right hip.  He is not having cough hemoptysis or chest tightness.  He is currently undergoing.  Maintenance Keytruda.  COMPLICATIONS OF TREATMENT: none  FOLLOW UP COMPLIANCE: keeps appointments   PHYSICAL EXAM:  BP (P) 122/75 (BP Location: Right Arm, Patient Position: Sitting)   Pulse (!) (P) 112   Temp (!) (P) 96 F (35.6 C) (Tympanic)   Resp (P) 2 frail-appearing male in NAD.  Range of motion of his lower extremities does not elicit pain. Well-developed well-nourished patient in NAD. HEENT reveals PERLA, EOMI, discs not visualized.  Oral cavity is clear. No oral mucosal lesions are identified. Neck is clear without evidence of cervical or supraclavicular adenopathy. Lungs are clear to A&P. Cardiac examination is essentially unremarkable with regular rate and rhythm without murmur rub or thrill. Abdomen is benign with no organomegaly or masses noted. Motor sensory and DTR levels are equal and symmetric in the upper and lower extremities. Cranial nerves II through XII are grossly intact. Proprioception is intact. No peripheral adenopathy or edema is identified. No motor or sensory levels are noted. Crude visual fields are within normal range.  RADIOLOGY RESULTS: PET scan and CT scans reviewed  PLAN: At this time I see no visible  lesions in his right hip.  We have treated multiple areas for palliation.  He is currently under maintenance Keytruda.  Of asked him to notify palliative care of his pain although in his notes he is actually stated prior that he is having excellent pain control.  I will turn follow-up care over to medical oncology and palliative care.  I be happy to reevaluate the patient anytime should further palliative treatment be indicated.  I would like to take this opportunity to thank you for allowing me to participate in the care of your patient.Noreene Filbert, MD

## 2021-03-06 NOTE — Telephone Encounter (Signed)
Wife called to report worsening sacral wounds. I've faxed nursing orders to [facility], who is currently treating him for home health.

## 2021-03-09 ENCOUNTER — Inpatient Hospital Stay: Payer: Medicare HMO

## 2021-03-09 ENCOUNTER — Telehealth: Payer: Self-pay | Admitting: Oncology

## 2021-03-09 ENCOUNTER — Inpatient Hospital Stay: Payer: Medicare HMO | Admitting: Oncology

## 2021-03-09 ENCOUNTER — Inpatient Hospital Stay: Payer: Medicare HMO | Admitting: Hospice and Palliative Medicine

## 2021-03-09 DIAGNOSIS — C3492 Malignant neoplasm of unspecified part of left bronchus or lung: Secondary | ICD-10-CM

## 2021-03-09 NOTE — Telephone Encounter (Signed)
Spoke with patient's wife by phone.  She reports the patient has significantly declined since discharging her from the hospital.  He is essentially nonambulatory now and she is having increasing difficulty getting him out of the house.  Patient is mostly dependent upon her for all ADLs including feeding.  He has worsening sacral ulcers from his immobility.  Subsequently, he has had worsening pain, which she feels is likely related to his sores.  Appetite is poor.    We discussed there are general goals.  She spoke with patient while I was on the phone and he is not interested in hospitalization.  He also verbalized a desire to forego further cancer treatment.  Both patient and wife were interested in hospice involvement at home with a focus on comfort.  I called in a verbal order for hospice to [facility].

## 2021-03-09 NOTE — Telephone Encounter (Signed)
Wife called to cancel pt appt for today.He is in a lot of pain, Would like for Vonna Kotyk or Dr. Woodfin Ganja to give her a call at [phone]

## 2021-03-09 NOTE — Addendum Note (Signed)
Addended by: Altha Harm R on: 03/09/2021 10:43 AM   Modules accepted: Orders

## 2021-03-14 ENCOUNTER — Telehealth: Payer: Self-pay | Admitting: *Deleted

## 2021-03-20 ENCOUNTER — Other Ambulatory Visit: Payer: Self-pay | Admitting: Primary Care

## 2021-03-30 NOTE — Telephone Encounter (Signed)
Donald Lowery called reporting that patient expired this morning at 9:20 AM at his home

## 2021-03-30 DEATH — deceased

## 2022-12-30 IMAGING — MR MR HEAD WO/W CM
14 series · 48 of 48 positions shown · IV contrast (gadavist)
Comparison: None.

CLINICAL DATA: Lung cancer, staging

EXAM:
MRI HEAD WITHOUT AND WITH CONTRAST
TECHNIQUE: Multiplanar, multiecho pulse sequences of the brain and surrounding
structures were obtained without and with intravenous contrast.
CONTRAST:  6mL GADAVIST GADOBUTROL 1 MMOL/ML IV SOLN

[Series 5: ax dwi_tracew · axial · 3.0mm · 0.71mm/px · z∈[-102,+57]mm · 4 of 56 slices shown]
[im 1/56]
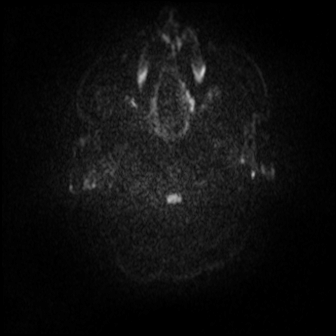
[im 19/56]
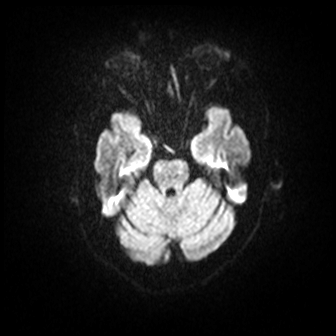
[im 37/56]
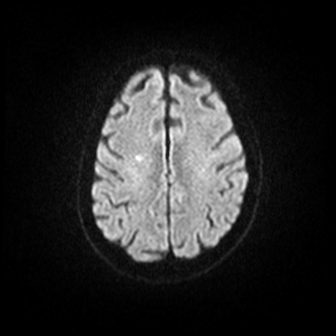
[im 56/56]
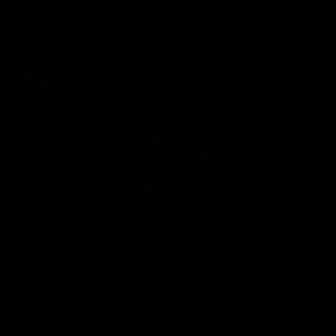

[Series 6: ax dwi_adc · axial · 3.0mm · 0.71mm/px · z∈[-102,+45]mm · 4 of 51 slices shown]
[im 1/51]
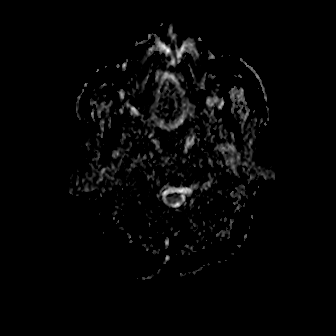
[im 17/51]
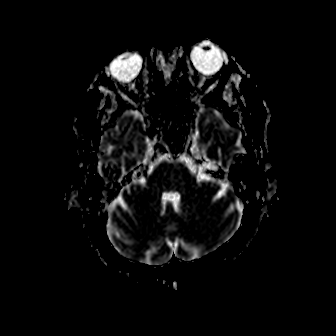
[im 34/51]
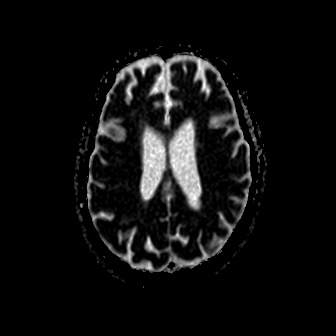
[im 51/51]
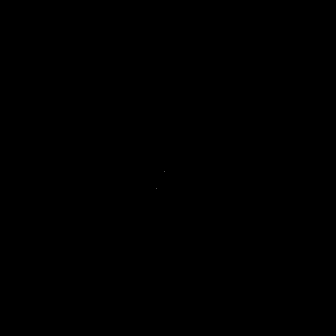

[Series 7: cor dwi_tracew · coronal · 5.0mm · 0.68mm/px · 2 of 40 slices shown]
[im 1/40]
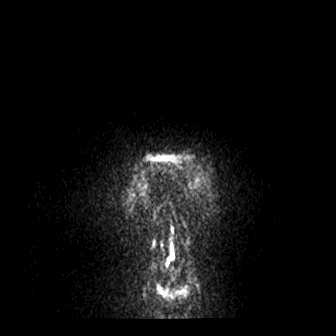
[im 40/40]
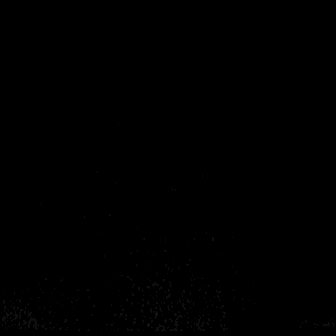

[Series 8: cor dwi_adc · coronal · 5.0mm · 0.68mm/px · 2 of 39 slices shown]
[im 1/39]
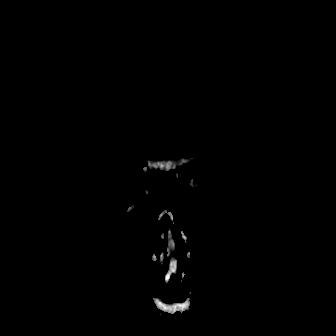
[im 39/39]
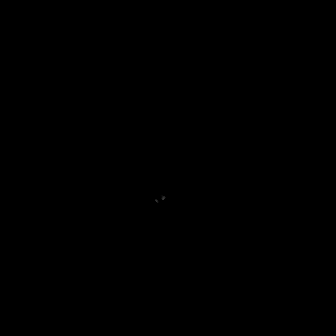

[Series 9: T1 · sagittal · 5.0mm · 0.62mm/px · 1 of 25 slices shown (1 of 2)]
[im 1/25]
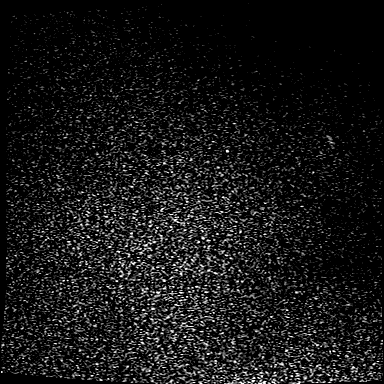

[Series 10: T2 · axial · 5.0mm · 0.53mm/px · 1 of 25 slices shown]
[im 1/25]
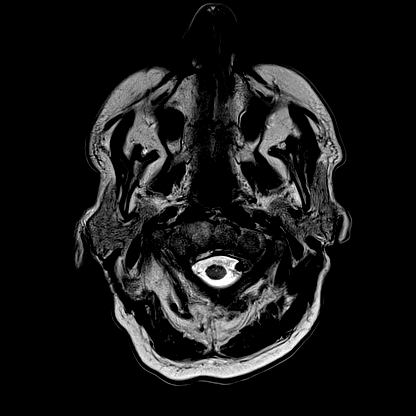

[Series 12: pha_images · axial · 3.0mm · 0.90mm/px · z∈[-114,+57]mm · 3 of 59 slices shown]
[im 1/59]
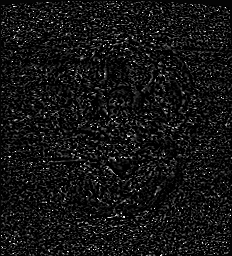
[im 30/59]
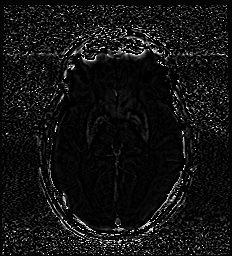
[im 59/59]
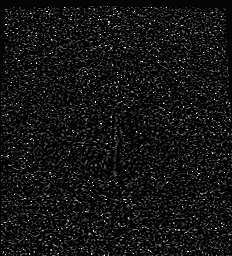

[Series 13: swi_images · axial · 3.0mm · 0.90mm/px · z∈[-114,+57]mm · 3 of 60 slices shown]
[im 1/60]
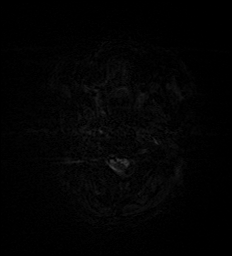
[im 30/60]
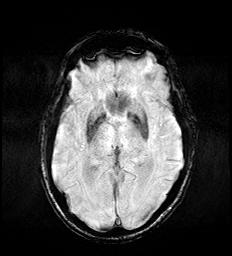
[im 60/60]
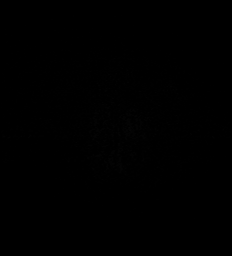

[Series 15: FLAIR · axial · 3.0mm · 0.53mm/px · z∈[-106,+51]mm · 3 of 55 slices shown]
[im 1/55]
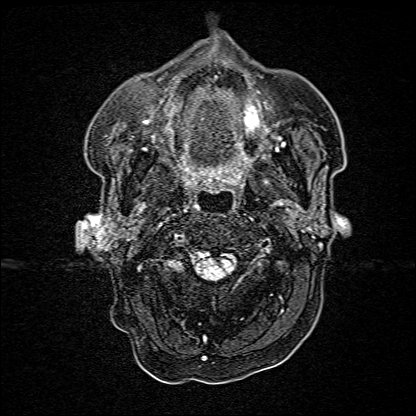
[im 28/55]
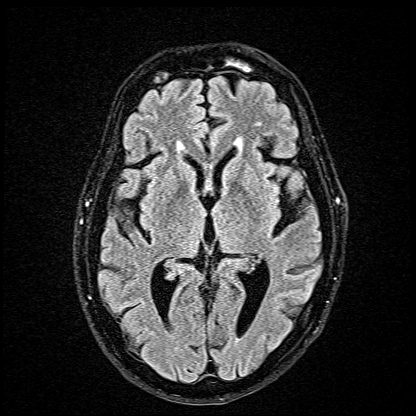
[im 55/55]
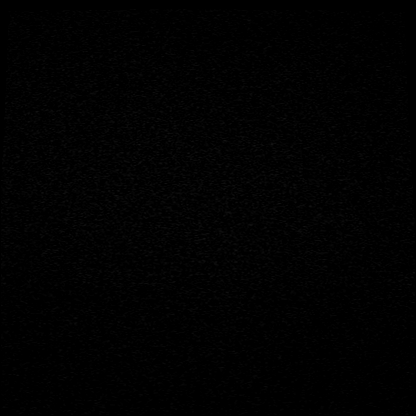

[Series 16: T1 · axial · 1.0mm · 0.98mm/px · z∈[-120,+49]mm · 10 of 176 slices shown (2 of 2)]
[im 1/176]
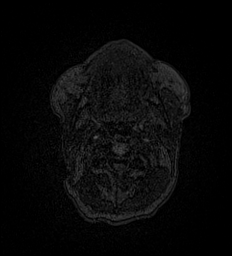
[im 20/176]
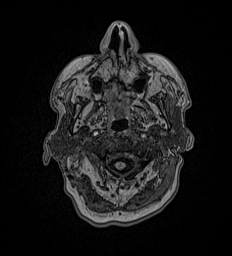
[im 39/176]
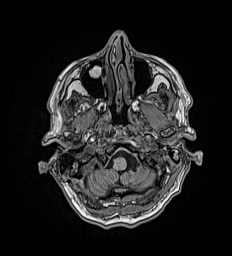
[im 59/176]
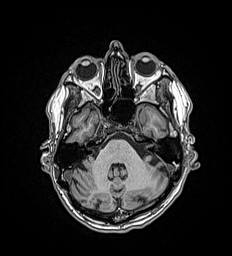
[im 78/176]
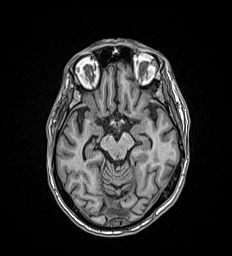
[im 98/176]
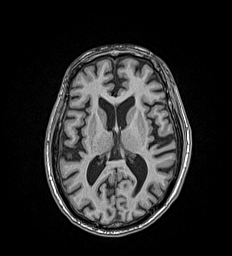
[im 117/176]
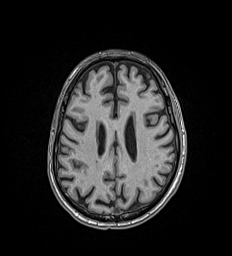
[im 137/176]
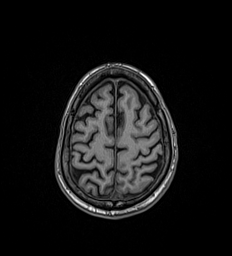
[im 156/176]
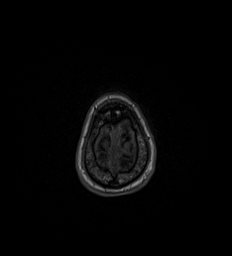
[im 176/176]
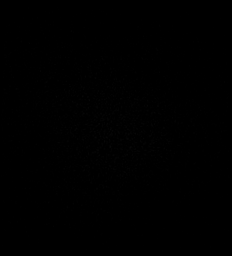

[Series 17: T2 post-contrast · coronal · 5.0mm · 0.57mm/px · 2 of 29 slices shown]
[im 1/29]
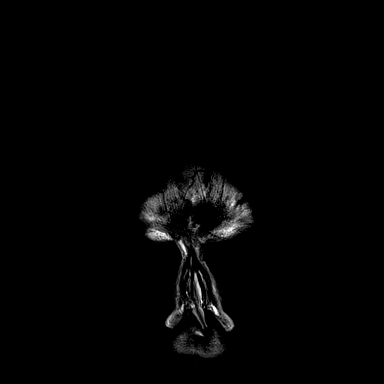
[im 29/29]
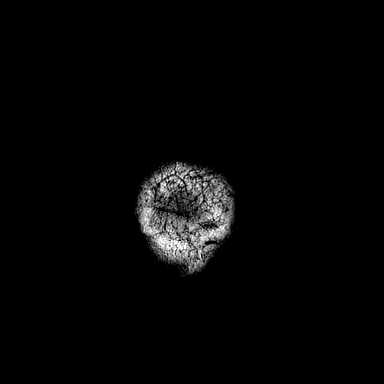

[Series 18: T1 post-contrast · axial · 1.0mm · 0.98mm/px · z∈[-120,+49]mm · 10 of 176 slices shown (1 of 3)]
[im 1/176]
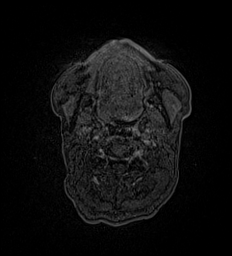
[im 20/176]
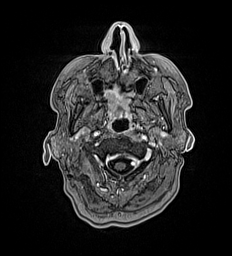
[im 39/176]
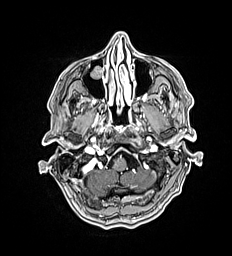
[im 59/176]
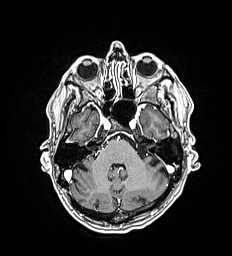
[im 78/176]
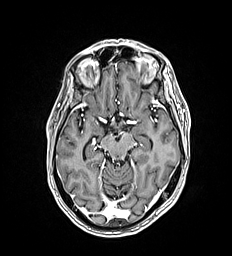
[im 98/176]
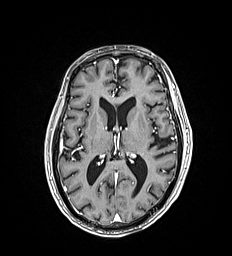
[im 117/176]
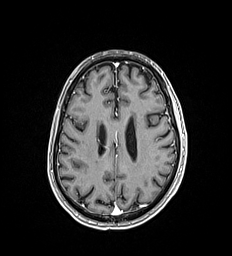
[im 137/176]
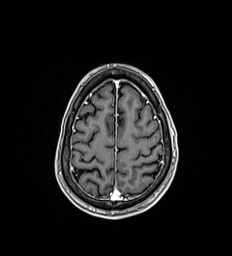
[im 156/176]
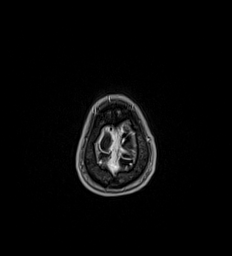
[im 176/176]
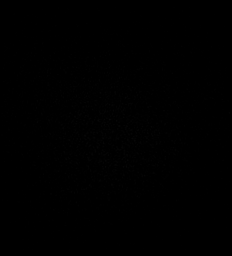

[Series 19: T1 post-contrast · coronal · 5.0mm · 0.57mm/px · 2 of 29 slices shown (2 of 3)]
[im 1/29]
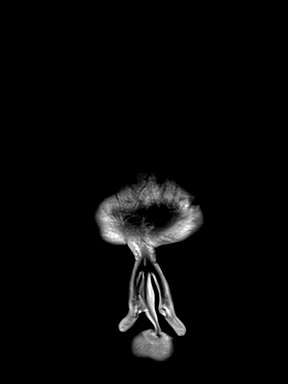
[im 29/29]
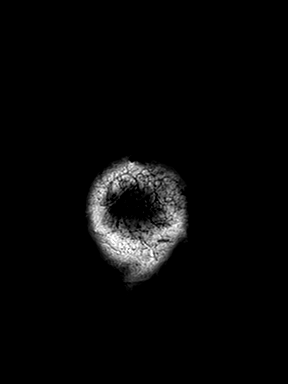

[Series 20: T1 post-contrast · sagittal · 5.0mm · 0.62mm/px · 1 of 25 slices shown (3 of 3)]
[im 1/25]
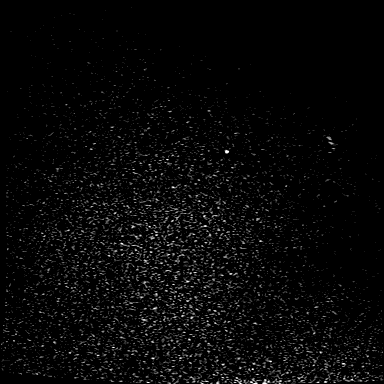

[48 of 48 positions shown; findings below may reference images not displayed]

FINDINGS: Brain: There is a small subcentimeter focus of diffusion
hyperintensity with ADC isointensity and some mild hypointensity
within the right centrum semiovale. No corresponding enhancement or
susceptibility.

No definite mass identified. No abnormal enhancement. No evidence of
intracranial hemorrhage. Prominence of the ventricles and sulci
reflects mild generalized parenchymal volume loss. Patchy foci of T2
hyperintensity in the supratentorial white matter are nonspecific
but may reflect mild chronic microvascular ischemic changes. There
is no hydrocephalus or extra-axial collection.

Vascular: Major vessel flow voids at the skull base are preserved.

Skull and upper cervical spine: Normal marrow signal is preserved.

Sinuses/Orbits: Trace mucosal thickening.  Orbits are unremarkable.

Other: Sella is unremarkable.  Mastoid air cells are clear.
IMPRESSION: A subcentimeter focus of abnormal diffusion signal in the deep right
frontal white matter likely reflects late acute to subacute infarct.
There is no associated enhancement to suggest metastasis.

No evidence of intracranial metastatic disease.

These results will be called to the ordering clinician or
representative by the Radiologist Assistant, and communication
documented in the PACS or [REDACTED].

## 2023-01-19 IMAGING — XA IR IMAGING GUIDED PORT INSERTION
2 series · 6 of 6 positions shown · non-contrast
Comparison: none

INDICATION: 70-year-old male referred for port catheter placement

[Series 1: ir fluoro/shunt/fist · 2 of 2 slices shown]
[im 1/2]
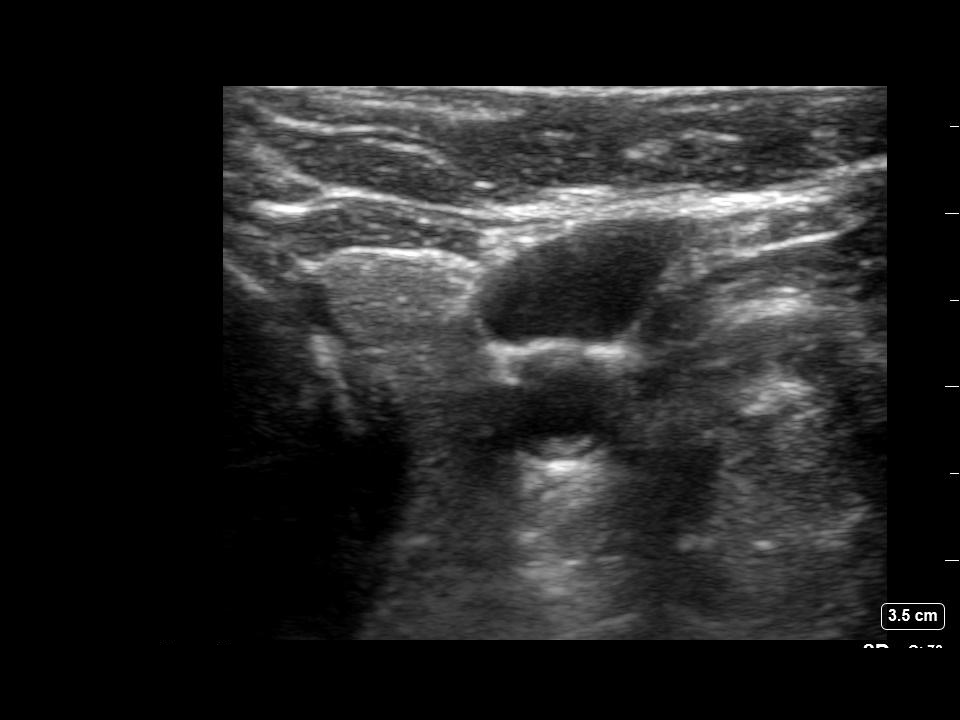
[im 2/2]
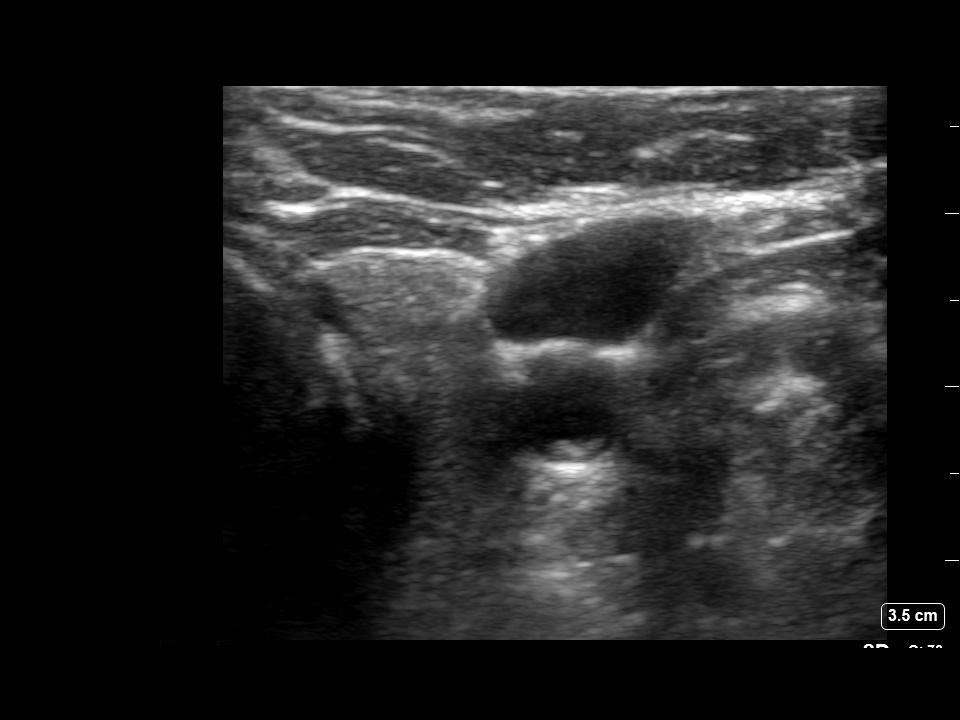

[Series 1: fluoroscopy - stored · 4 of 13 frames shown]
[frame 1/13]
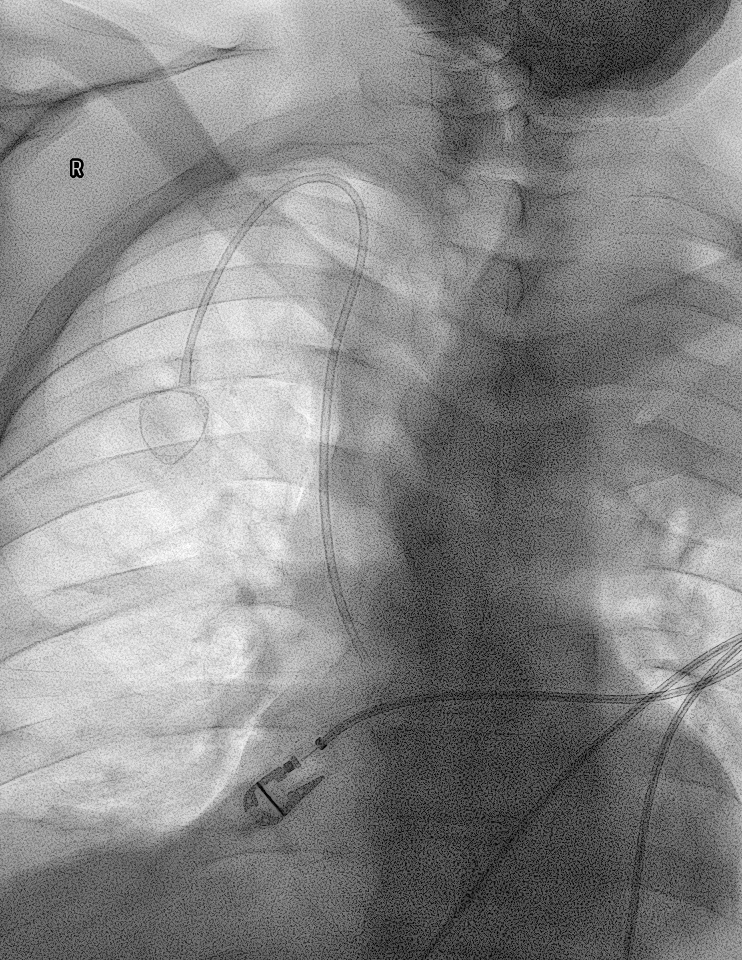
[frame 2/13]
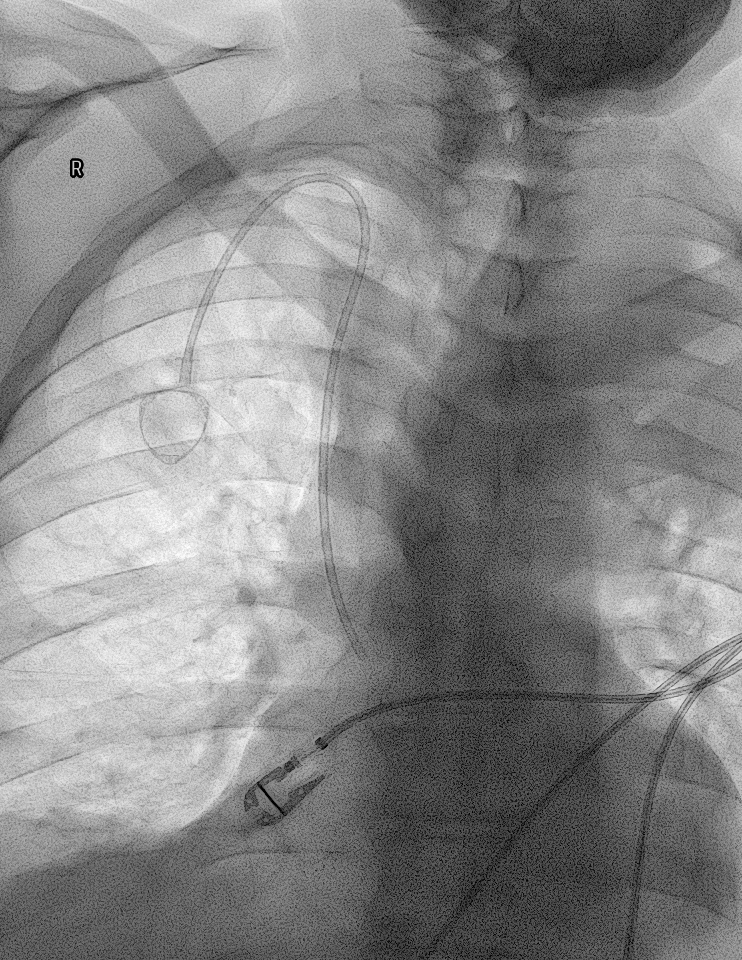
[frame 7/13]
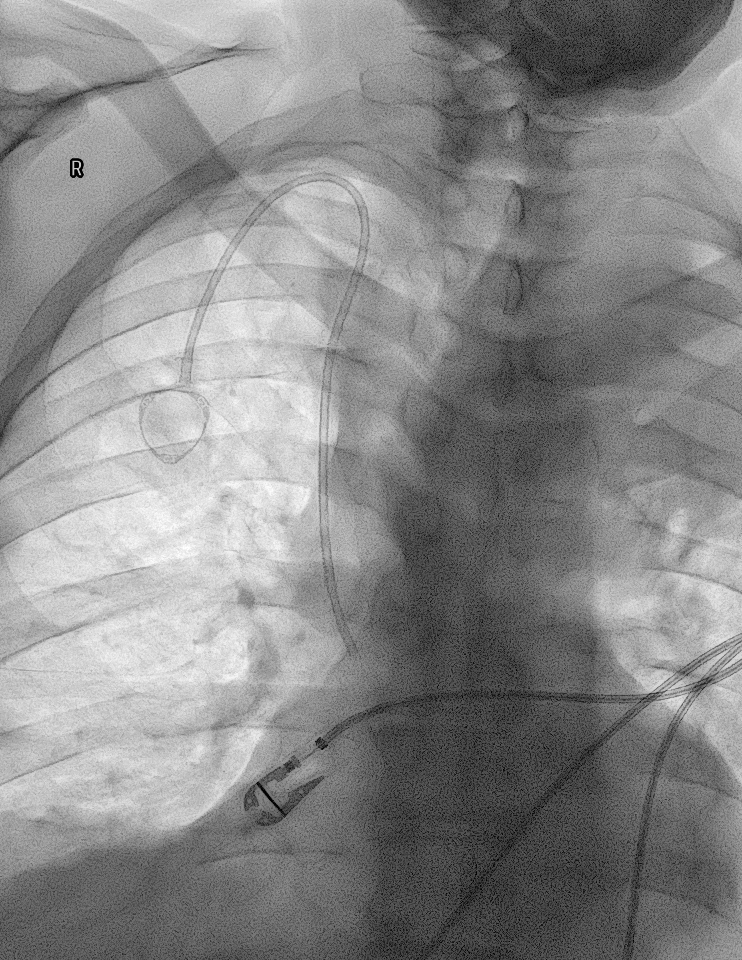
[frame 12/13]
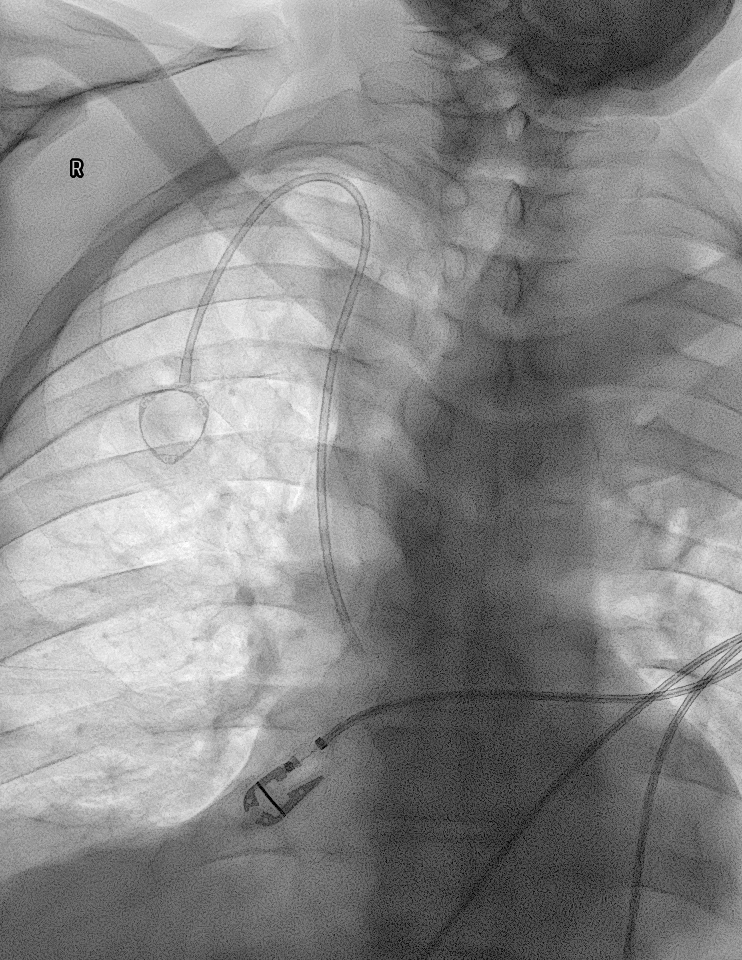

[6 of 6 positions shown; findings below may reference images not displayed]

EXAM:
IMAGE GUIDED PORT CATHETER PLACEMENT

MEDICATIONS:
None

ANESTHESIA/SEDATION:
Moderate (conscious) sedation was employed during this procedure. A
total of Versed 1.0 mg and Fentanyl 50 mcg was administered
intravenously.

Moderate Sedation Time: 20 minutes. The patient's level of
consciousness and vital signs were monitored continuously by
radiology nursing throughout the procedure under my direct
supervision.

FLUOROSCOPY TIME:  Fluoroscopy Time: 0 minutes 6 seconds (0.57 mGy).

COMPLICATIONS:
None

PROCEDURE:
The procedure, risks, benefits, and alternatives were explained to
the patient. Questions regarding the procedure were encouraged and
answered. The patient understands and consents to the procedure.

Ultrasound survey was performed with images stored and sent to PACs.

The right neck and chest was prepped with chlorhexidine, and draped
in the usual sterile fashion using maximum barrier technique (cap
and mask, sterile gown, sterile gloves, large sterile sheet, hand
hygiene and cutaneous antiseptic). Local anesthesia was attained by
infiltration with 1% lidocaine without epinephrine.

Ultrasound demonstrated patency of the right internal jugular vein,
and this was documented with an image. Under real-time ultrasound
guidance, this vein was accessed with a 21 gauge micropuncture
needle and image documentation was performed. A small dermatotomy
was made at the access site with an 11 scalpel. A 0.018" wire was
advanced into the SVC and used to estimate the length of the
internal catheter. The access needle exchanged for a 4F
micropuncture vascular sheath. The 0.018" wire was then removed and
a 0.035" wire advanced into the IVC.



The venous access site was then serially dilated and a peel away
vascular sheath placed over the wire. The wire was removed and the
port catheter advanced into position under fluoroscopic guidance.
The catheter tip is positioned in the cavoatrial junction. This was
documented with a spot image. The portacatheter was then tested and
found to flush and aspirate well. The port was flushed with saline
followed by 100 units/mL heparinized saline.

The pocket was then closed in two layers using first subdermal
inverted interrupted absorbable sutures followed by a running
subcuticular suture. The epidermis was then sealed with Dermabond.
The dermatotomy at the venous access site was also seal with
Dermabond.

Patient tolerated the procedure well and remained hemodynamically
stable throughout.

No complications encountered and no significant blood loss
encountered
IMPRESSION: Status post right IJ port catheter placement.
